# Patient Record
Sex: Female | Born: 1950
Health system: Southern US, Community
[De-identification: ages and names within clinical notes are randomized; demographics above are authoritative.]

## PROBLEM LIST (undated history)

## (undated) DIAGNOSIS — I739 Peripheral vascular disease, unspecified: Secondary | ICD-10-CM

## (undated) DIAGNOSIS — Z952 Presence of prosthetic heart valve: Secondary | ICD-10-CM

## (undated) DIAGNOSIS — I779 Disorder of arteries and arterioles, unspecified: Secondary | ICD-10-CM

## (undated) DIAGNOSIS — Z7901 Long term (current) use of anticoagulants: Secondary | ICD-10-CM

## (undated) DIAGNOSIS — M329 Systemic lupus erythematosus, unspecified: Secondary | ICD-10-CM

## (undated) DIAGNOSIS — I442 Atrioventricular block, complete: Secondary | ICD-10-CM

## (undated) DIAGNOSIS — E785 Hyperlipidemia, unspecified: Secondary | ICD-10-CM

## (undated) DIAGNOSIS — I10 Essential (primary) hypertension: Secondary | ICD-10-CM

## (undated) DIAGNOSIS — R760 Raised antibody titer: Secondary | ICD-10-CM

## (undated) DIAGNOSIS — I2510 Atherosclerotic heart disease of native coronary artery without angina pectoris: Secondary | ICD-10-CM

## (undated) DIAGNOSIS — Z9289 Personal history of other medical treatment: Secondary | ICD-10-CM

## (undated) HISTORY — DX: Peripheral vascular disease, unspecified: I73.9

## (undated) HISTORY — DX: Atherosclerotic heart disease of native coronary artery without angina pectoris: I25.10

## (undated) HISTORY — DX: Systemic lupus erythematosus, unspecified: M32.9

## (undated) HISTORY — DX: Presence of prosthetic heart valve: Z95.2

## (undated) HISTORY — PX: CAROTID ENDARTERECTOMY: SUR193

## (undated) HISTORY — DX: Personal history of other medical treatment: Z92.89

## (undated) HISTORY — DX: Essential (primary) hypertension: I10

## (undated) HISTORY — DX: Long term (current) use of anticoagulants: Z79.01

## (undated) HISTORY — PX: AORTIC VALVE REPLACEMENT: SHX41

## (undated) HISTORY — DX: Disorder of arteries and arterioles, unspecified: I77.9

## (undated) HISTORY — DX: Raised antibody titer: R76.0

## (undated) HISTORY — DX: Hyperlipidemia, unspecified: E78.5

---

## 1982-11-20 HISTORY — PX: TUBAL LIGATION: SHX77

## 1997-02-19 HISTORY — PX: CHOLECYSTECTOMY: SHX55

## 1997-11-08 ENCOUNTER — Emergency Department (HOSPITAL_COMMUNITY): Admission: EM | Admit: 1997-11-08 | Discharge: 1997-11-08 | Payer: Self-pay

## 1997-11-11 ENCOUNTER — Encounter: Payer: Self-pay | Admitting: Gastroenterology

## 1997-11-11 ENCOUNTER — Inpatient Hospital Stay (HOSPITAL_COMMUNITY): Admission: RE | Admit: 1997-11-11 | Discharge: 1997-11-13 | Payer: Self-pay | Admitting: Gastroenterology

## 1997-12-21 ENCOUNTER — Other Ambulatory Visit: Admission: RE | Admit: 1997-12-21 | Discharge: 1997-12-21 | Payer: Self-pay | Admitting: *Deleted

## 1998-03-03 ENCOUNTER — Ambulatory Visit (HOSPITAL_COMMUNITY): Admission: RE | Admit: 1998-03-03 | Discharge: 1998-03-03 | Payer: Self-pay | Admitting: Vascular Surgery

## 1998-03-03 ENCOUNTER — Encounter: Payer: Self-pay | Admitting: Vascular Surgery

## 1998-03-05 ENCOUNTER — Ambulatory Visit (HOSPITAL_COMMUNITY): Admission: RE | Admit: 1998-03-05 | Discharge: 1998-03-05 | Payer: Self-pay | Admitting: Family Medicine

## 1998-03-05 ENCOUNTER — Encounter: Payer: Self-pay | Admitting: Family Medicine

## 1998-03-09 ENCOUNTER — Ambulatory Visit (HOSPITAL_COMMUNITY): Admission: RE | Admit: 1998-03-09 | Discharge: 1998-03-09 | Payer: Self-pay | Admitting: Family Medicine

## 1998-03-27 ENCOUNTER — Emergency Department (HOSPITAL_COMMUNITY): Admission: EM | Admit: 1998-03-27 | Discharge: 1998-03-27 | Payer: Self-pay | Admitting: Emergency Medicine

## 1998-04-08 ENCOUNTER — Emergency Department (HOSPITAL_COMMUNITY): Admission: EM | Admit: 1998-04-08 | Discharge: 1998-04-08 | Payer: Self-pay | Admitting: Emergency Medicine

## 1998-04-11 ENCOUNTER — Encounter: Payer: Self-pay | Admitting: Vascular Surgery

## 1998-04-11 ENCOUNTER — Ambulatory Visit (HOSPITAL_COMMUNITY): Admission: RE | Admit: 1998-04-11 | Discharge: 1998-04-11 | Payer: Self-pay | Admitting: Vascular Surgery

## 1998-04-20 ENCOUNTER — Encounter: Payer: Self-pay | Admitting: Vascular Surgery

## 1998-04-20 ENCOUNTER — Inpatient Hospital Stay: Admission: RE | Admit: 1998-04-20 | Discharge: 1998-04-22 | Payer: Self-pay | Admitting: Vascular Surgery

## 1998-11-18 ENCOUNTER — Ambulatory Visit (HOSPITAL_COMMUNITY): Admission: RE | Admit: 1998-11-18 | Discharge: 1998-11-18 | Payer: Self-pay | Admitting: Nephrology

## 1998-11-18 ENCOUNTER — Encounter: Payer: Self-pay | Admitting: Nephrology

## 1999-01-20 ENCOUNTER — Other Ambulatory Visit: Admission: RE | Admit: 1999-01-20 | Discharge: 1999-01-20 | Payer: Self-pay | Admitting: *Deleted

## 1999-12-11 ENCOUNTER — Encounter: Payer: Self-pay | Admitting: Nephrology

## 1999-12-11 ENCOUNTER — Ambulatory Visit (HOSPITAL_COMMUNITY): Admission: RE | Admit: 1999-12-11 | Discharge: 1999-12-11 | Payer: Self-pay | Admitting: Nephrology

## 2000-01-05 ENCOUNTER — Ambulatory Visit (HOSPITAL_COMMUNITY): Admission: RE | Admit: 2000-01-05 | Discharge: 2000-01-05 | Payer: Self-pay | Admitting: Nephrology

## 2000-01-05 ENCOUNTER — Encounter: Payer: Self-pay | Admitting: Nephrology

## 2000-01-23 ENCOUNTER — Other Ambulatory Visit: Admission: RE | Admit: 2000-01-23 | Discharge: 2000-01-23 | Payer: Self-pay | Admitting: *Deleted

## 2000-09-19 ENCOUNTER — Other Ambulatory Visit: Admission: RE | Admit: 2000-09-19 | Discharge: 2000-09-19 | Payer: Self-pay | Admitting: *Deleted

## 2000-09-19 ENCOUNTER — Encounter (INDEPENDENT_AMBULATORY_CARE_PROVIDER_SITE_OTHER): Payer: Self-pay | Admitting: Specialist

## 2000-09-26 ENCOUNTER — Ambulatory Visit (HOSPITAL_COMMUNITY): Admission: RE | Admit: 2000-09-26 | Discharge: 2000-09-26 | Payer: Self-pay | Admitting: Cardiovascular Disease

## 2000-09-26 ENCOUNTER — Encounter: Payer: Self-pay | Admitting: Cardiovascular Disease

## 2001-01-21 ENCOUNTER — Ambulatory Visit (HOSPITAL_COMMUNITY): Admission: RE | Admit: 2001-01-21 | Discharge: 2001-01-21 | Payer: Self-pay | Admitting: Cardiovascular Disease

## 2001-08-31 ENCOUNTER — Emergency Department (HOSPITAL_COMMUNITY): Admission: EM | Admit: 2001-08-31 | Discharge: 2001-08-31 | Payer: Self-pay

## 2002-03-02 ENCOUNTER — Other Ambulatory Visit: Admission: RE | Admit: 2002-03-02 | Discharge: 2002-03-02 | Payer: Self-pay | Admitting: Obstetrics and Gynecology

## 2002-04-13 ENCOUNTER — Inpatient Hospital Stay (HOSPITAL_COMMUNITY): Admission: EM | Admit: 2002-04-13 | Discharge: 2002-04-16 | Payer: Self-pay | Admitting: Emergency Medicine

## 2002-04-13 ENCOUNTER — Encounter: Payer: Self-pay | Admitting: Emergency Medicine

## 2002-04-13 ENCOUNTER — Encounter: Payer: Self-pay | Admitting: Orthopaedic Surgery

## 2003-03-16 ENCOUNTER — Other Ambulatory Visit: Admission: RE | Admit: 2003-03-16 | Discharge: 2003-03-16 | Payer: Self-pay | Admitting: Obstetrics and Gynecology

## 2004-04-20 ENCOUNTER — Ambulatory Visit: Payer: Self-pay | Admitting: Internal Medicine

## 2004-10-26 ENCOUNTER — Encounter: Admission: RE | Admit: 2004-10-26 | Discharge: 2004-10-26 | Payer: Self-pay | Admitting: Vascular Surgery

## 2004-11-01 HISTORY — PX: CARDIAC CATHETERIZATION: SHX172

## 2005-01-17 ENCOUNTER — Inpatient Hospital Stay (HOSPITAL_COMMUNITY): Admission: RE | Admit: 2005-01-17 | Discharge: 2005-01-23 | Payer: Self-pay | Admitting: Cardiothoracic Surgery

## 2005-01-17 ENCOUNTER — Encounter (INDEPENDENT_AMBULATORY_CARE_PROVIDER_SITE_OTHER): Payer: Self-pay | Admitting: Specialist

## 2005-02-22 ENCOUNTER — Encounter: Admission: RE | Admit: 2005-02-22 | Discharge: 2005-02-22 | Payer: Self-pay | Admitting: Cardiothoracic Surgery

## 2005-02-26 ENCOUNTER — Ambulatory Visit: Payer: Self-pay | Admitting: Internal Medicine

## 2005-04-20 ENCOUNTER — Ambulatory Visit: Payer: Self-pay | Admitting: Internal Medicine

## 2006-05-21 ENCOUNTER — Ambulatory Visit: Payer: Self-pay | Admitting: Internal Medicine

## 2006-07-01 ENCOUNTER — Ambulatory Visit: Payer: Self-pay | Admitting: Family Medicine

## 2006-07-01 ENCOUNTER — Inpatient Hospital Stay (HOSPITAL_COMMUNITY): Admission: EM | Admit: 2006-07-01 | Discharge: 2006-07-02 | Payer: Self-pay | Admitting: Emergency Medicine

## 2007-08-25 ENCOUNTER — Telehealth (INDEPENDENT_AMBULATORY_CARE_PROVIDER_SITE_OTHER): Payer: Self-pay | Admitting: *Deleted

## 2007-08-26 ENCOUNTER — Ambulatory Visit: Payer: Self-pay | Admitting: Internal Medicine

## 2007-08-26 DIAGNOSIS — M329 Systemic lupus erythematosus, unspecified: Secondary | ICD-10-CM | POA: Insufficient documentation

## 2007-08-26 DIAGNOSIS — J4520 Mild intermittent asthma, uncomplicated: Secondary | ICD-10-CM | POA: Insufficient documentation

## 2007-08-26 DIAGNOSIS — Z952 Presence of prosthetic heart valve: Secondary | ICD-10-CM | POA: Insufficient documentation

## 2009-04-22 ENCOUNTER — Ambulatory Visit: Payer: Self-pay | Admitting: Internal Medicine

## 2010-01-11 ENCOUNTER — Encounter: Admission: RE | Admit: 2010-01-11 | Discharge: 2010-01-11 | Payer: Self-pay | Admitting: Orthopaedic Surgery

## 2010-01-20 DIAGNOSIS — Z9289 Personal history of other medical treatment: Secondary | ICD-10-CM

## 2010-01-20 HISTORY — DX: Personal history of other medical treatment: Z92.89

## 2010-03-21 NOTE — Assessment & Plan Note (Signed)
Summary: cough/apc   Primary Provider/Referring Provider:  Driscilla Moats  CC:  Accute visit-cough non productive x 1 week. Slight BOB and chills today.[name]  History of Present Illness: 7/09- AORTIC VALVE REPLACEMENT, HX OF (ICD-V43.3) LUPUS (SLE) (ICD-710.0) ASTHMA (ICD-493.90)  76 yoF returning for f/u of asthma with history of aortic valve replacement. We reviewed medications. Bothered by heat but denies new problems.  2009/05/22- Asthma, lupus, Hx Aortic Valve replc Last here 2009. comes now acute visit. Onset 1 week ago of cough, nonproductive but with substernal rattle. Sore throat from cough and transient tussive left rib pain now resolved. No GI distress and no fever or chills, head congestion or sinus drainage. She was up with family because her father died. she was exposed to others who were sick. Was using Advair once daily because it made her cough. No recent Proair.    Current Medications (verified): 1)  Advair Diskus 250-50 Mcg/dose  Misc (Fluticasone-Salmeterol) .... One Puff Twice Daily 2)  Proair Hfa 108 (90 Base) Mcg/act  Aers (Albuterol Sulfate) .... 2 Puffs Four Times A Day As Needed 3)  Altace 5 Mg  Tabs (Ramipril) .... Take 1 By Mouth Once Daily 4)  Adalat Cc 60 Mg  Tb24 (Nifedipine) .... Take 1` By Mouth Once Daily 5)  Prednisone 5 Mg  Tabs (Prednisone) .... Take 1 By Mouth Once Daily 6)  Hydroxychloroquine Sulfate 200 Mg  Tabs (Hydroxychloroquine Sulfate) .... Take 1 By Mouth Two Times A Day 7)  Caltrate 600 1500 Mg  Tabs (Calcium Carbonate) .... Take 1 By Mouth Two Times A Day 8)  Centrum Silver   Tabs (Multiple Vitamins-Minerals) .... Take 1 By Mouth Once Daily 9)  Zocor 20 Mg  Tabs (Simvastatin) .... Take 1 By Mouth At Bedtime 10)  Coumadin 10 Mg  Tabs (Warfarin Sodium) .... As Directed 11)  Furosemide 80 Mg Tabs (Furosemide) .... Take 1 By Mouth Once Daily 12)  Fish Oil 1000 Mg Caps (Omega-3 Fatty Acids) .... Take 1 By Mouth Two Times A Day 13)  Vitamin D  1000 Unit Tabs (Cholecalciferol) .... Take 1 By Mouth Once Daily  Allergies (verified): No Known Drug Allergies  Past History:  Past Medical History: Last updated: 08/26/2007 Asthma Lupus (SLE)  Past Surgical History: Last updated: 08/26/2007 Aortic valve replacement  Family History: Last updated: May 22, 2009 Father- died Alzheimers and nonalcoholic cirrhosis  Social History: Last updated: 08/26/2007 Patient never smoked.   Risk Factors: Smoking Status: never (08/26/2007)  Family History: Father- died Alzheimers and nonalcoholic cirrhosis  Review of Systems      See HPI       The patient complains of prolonged cough.  The patient denies anorexia, fever, weight loss, weight gain, vision loss, decreased hearing, hoarseness, chest pain, syncope, dyspnea on exertion, peripheral edema, headaches, hemoptysis, abdominal pain, and severe indigestion/heartburn.    Vital Signs:  Patient profile:   60 year old female Weight:      257 pounds O2 Sat:      98 % on Room air Temp:     97.3 degrees F oral Pulse rate:   77 / minute BP sitting:   122 / 78  (left arm) Cuff size:   large  Vitals Entered By: Clayborne Dana CMA May 22, 2009 11:39 AM)  O2 Flow:  Room air  Physical Exam  Additional Exam:  General: A/Ox3; pleasant and cooperative, NAD, overweight SKIN: no rash, lesions NODES: no lymphadenopathy HEENT: [place]/AT, EOM- WNL, Conjuctivae- clear, PERRLA,  TM-WNL, Nose- clear, Throat- clear and wnl, Mallampati  III NECK: Supple w/ fair ROM, JVD- none, normal carotid impulses w/o bruits Thyroid- normal to palpation CHEST: Clear to P&A, not coughing, no wheeze HEART: RRR, no m/g/r heard ABDOMEN: Soft and nl;  [id number], nl pulses, no edema  NEURO: Grossly intact to observation      Impression & Recommendations:  Problem # 1:  ASTHMA (ICD-493.90) i can't get hx suggesting obvious seasonal allergy or GERD/ aspiration event so this is most likely a viral bronchitis  despitre lack of nasal involvement, sore throat or fever that she recognized.  Medications Added to Medication List This Visit: 1)  Furosemide 80 Mg Tabs (Furosemide) .... Take 1 by mouth once daily 2)  Fish Oil 1000 Mg Caps (Omega-3 fatty acids) .... Take 1 by mouth two times a day 3)  Vitamin D 1000 Unit Tabs (Cholecalciferol) .... Take 1 by mouth once daily  Other Orders: Est. Patient Level II [id number]) Admin of Therapeutic Inj  intramuscular or subcutaneous [id number]) Depo- Medrol 80mg  (J1040) Nebulizer Tx [id number])  Patient Instructions: 1)  Schedule return in one year, earlier if needed 2)  Neb xop 1.25 3)  depo 80 4)  Script for [employer] rescue inhaler refill to put on hold at drug store for use as needed Prescriptions: PROAIR HFA 108 (90 BASE) MCG/ACT  AERS (ALBUTEROL SULFATE) 2 puffs four times a day as needed  #1 x prn   Entered and Authorized by:   Deneise Lever MD   Signed by:   Deneise Lever MD on 04/22/2009   Method used:   Print then Give to Patient   [id number]      Medication Administration  Injection # 1:    Medication: Depo- Medrol 80mg     Diagnosis: ASTHMA (ICD-493.90)    Route: SQ    Site: LUOQ gluteus    Exp Date: 12/2011    Lot #: [id number]    Mfr: Pharmacia    Patient tolerated injection without complications    Given by: Blair Hailey  Medication # 1:    Medication: Xopenex 1.25mg     Diagnosis: ASTHMA (ICD-493.90)    Dose: 1 vial    Route: inhaled    Exp Date: 10/2009    Lot #: [id number]    Mfr: Sepracor    Patient tolerated medication without complications    Given by: Blair Hailey  Orders Added: 1)  Est. Patient Level II [id number] 2)  Admin of Therapeutic Inj  intramuscular or subcutaneous [[id number]] 3)  Depo- Medrol 80mg  [J1040] 4)  Nebulizer Tx [id number]

## 2010-07-04 NOTE — H&P (Signed)
NAMEJENNIVER, PURCELL                 ACCOUNT NO.:  [id number]   MEDICAL RECORD NO.:  [id number]          PATIENT TYPE:  INP   LOCATION:  5709                         FACILITY:  [facility]   PHYSICIAN:  Brion Aliment, M.D.  DATE OF BIRTH:  1950-05-20   DATE OF ADMISSION:  06/29/2006  DATE OF DISCHARGE:  07/02/2006                              HISTORY & PHYSICAL   CHIEF COMPLAINT:  Abdominal pain.   HISTORY OF PRESENT ILLNESS:  Ms. Grall is a 60 year old female with  significant history of carotid, superior mesenteric artery, and right  renal artery stenosis, who presents with a 1-day history of sharp,  constant abdominal pain, at first diffuse and then localized into the  right lower quadrant.  She also complains of nausea but denies vomiting,  diarrhea, or constipation.  The pain started about 4 or 5 p.m. the day  before admission.  Drinking liquids does not bother the pain, but she  has not tried any food since lunch time the day before admission but  does state that she is hungry now.  Her last bowel movement was the a.m.  of admission and was slightly loose, nonmelanotic with no bright red  blood.  Her pain improved slightly after her bowel movement.  She was up  all last night with the pain, is not worse with movement but does  radiate some to her back.  She complains of chills but no fever.  She  went to [facility] and was sent to the [facility] emergency  department, where CT scan showed colitis.  She received 1 mg of Dilaudid  and some Zofran with some relief.  She was started on Cipro and Flagyl,  and we were called to admit.   PAST MEDICAL HISTORY:  1. Aortic valve replacement in 2006.  2. SLE.  3. Hypertension.  4. Asthma.  5. Right renal artery stenosis.  6. SMA stenosis.  7. Left carotid stenosis.   SURGICAL HISTORY:  1. Aortic valve replacement in November of 2006 with a mechanical      valve.  2. Cholecystectomy in 1999.  3. BTL.  4. Right ankle surgery  in 2004.  5. Left common internal carotid bypass grafting in 2000.  6. A D&C in 2002.   ALLERGIES:  No known drug allergies.   HOME MEDICATIONS:  1. Coumadin 5 mg daily.  2. Altace 5 mg daily.  3. Zocor 20 mg daily.  4. Prednisone 5 mg daily.  5. Advair 250/50 one puff daily.  6. Lasix 80 mg daily.  7. Caltrate 600 mg b.i.d.  8. Hydroxychloroquine 200 mg b.i.d.  9. Adalat 60 mg one daily.  10.Vitamin D daily.  11.Fish oil 1 tablet daily.   SOCIAL HISTORY:  The patient is married with 3 kids.  She denies tobacco  history, alcohol history, or illicit drug history.  She is retired and  lives in [place].   FAMILY HISTORY:  Both parents are alive.  Her mother has rheumatoid  arthritis and corneal dystrophy.  Her dad has Alzheimer's disease.   REVIEW OF SYSTEMS:  In  general, negative for weight loss and fevers,  positive for chills.  HEENT:  Positive for seasonal allergies.  Cardiovascular:  Negative for chest pain, negative for palpitations.  Pulmonary:  Negative for cough, negative for shortness of breath,  negative for wheezing.  GI:  See HPI.  GU:  Negative for dysuria,  negative for urgency, negative for frequency, negative for hematuria.  Skin:  Negative for rash.  Neuro:  Negative for weakness.  Heme:  Positive for easy bruising.  Musculoskeletal:  Positive for the  patient's typical joint pain from SLE.   PHYSICAL EXAMINATION:  VITAL SIGNS:  Temperature is 98.3 to 99.1, pulse  is 78 to 84, blood pressure 121 to 142/69 to 73, respirations were 16 to  24, and sats were 98 to 100% on room air.  GENERAL:  The patient was alert and oriented and in no acute distress.  HEENT:  Head is normocephalic, atraumatic.  Eyes:  PERRL.  EOMI.  Mild  scleral injection.  Nose:  Nares clear.  Oropharynx clear without  erythema or exudate.  NECK:  Supple, nontender without lymphadenopathy.  CARDIOVASCULAR:  Heart had a regular rate and rhythm with a 2/6 systolic  ejection murmur loudest  at the right upper sternal border.  PULMONARY:  Lungs were clear to auscultation bilaterally without  crackles or wheezes, and the patient had normal respiratory effort.  ABDOMEN:  Soft and tender to palpation in the right lower quadrant and  suprapubic region.  The patient had no rebound and no guarding.  She had  normoactive bowel sounds and no hepatosplenomegaly or masses.  RECTAL EXAM:  Had normal tone and was heme positive.  EXTREMITIES:  Had 2+ pitting edema to the mid shin.  She had 2+ dorsalis  pedis pulses bilaterally.  NEURO EXAM:  Nonfocal.   LABORATORY:  White count was 12.5, hemoglobin was 13.1, hematocrit was  38.9, platelets are 253.  ANC was 10.4, INR was 2.8.  Sodium 136,  potassium 4.2, chloride 100, bicarb 28, BUN 20, creatinine 1.08, glucose  117, calcium 9.3, total protein was 7.4, albumin was 3.8, AST was 30,  ALT was 29.  A UA shows specific gravity of 1.012 with trace of  hemoglobin, trace of leukocyte esterase, rare bacteria, and 0-2 white  blood cells.  A CT scan of the abdomen and pelvis with contrast showed a  right cecal and proximal ascending colon, mural thickening, surrounding  inflammation compatible with colitis.   DIFFERENTIAL DIAGNOSIS:  Including infection, inflammation, ischemic  versus a mural mass/malignancy.  No adenopathy, free air, abscess, or  obstruction was seen.   ASSESSMENT AND PLAN:  This is a 60 year old female with significant  history of vascular disease and colitis.  1. Colitis:  Differential diagnosis includes ischemic colitis,      inflammatory bowel disease, infectious colitis, and mural      malignancy.  The patient has no gross blood in stools but stools      are heme positive.  She is at high risk for ischemia given her      history of critical superior mesenteric artery stenosis on MRA in      2006.  We thought her risk of infectious colitis was low given that     her white count is almost normal.  She is afebrile and has  no      diarrhea or has sick contacts.  However, we did obtain a stool      culture.  This could also be  inflammatory bowel disease, as the      patient has a history of autoimmune issues with her systemic lupus      erythematosus.  Malignancy unlikely given sudden onset without      weight loss or systemic symptoms.  We had admitted the patient,      began empiric Cipro and Flagyl, and IV fluids, and bowel rest.  The      patient was started on morphine for pain as needed.  The patient is      not having any vomiting, so NG tube was not necessary at the time      of admission.  2. Fluids, electrolytes, and nutrition and gastrointestinal:  Keep the      patient n.p.o. and start IV fluids D5 half-normal saline plus 20 of      KCl at 15 mL/hr.  3. Renal:  The patient's urine had a trace of leukocyte esterase so we      will obtain a urine culture.  4. Cardiovascular:  We will hold the patient's hypertensive medicines      and Coumadin while she is n.p.o.  If necessary, we will start      Lovenox to keep her anticoagulated during this time.  Also we will      watch her blood pressure and give IV medications if necessary.  5. Hyperlipidemia:  We will hold the patient's Zocor while she is      n.p.o.  6. Asthma:  We will continue the patient's Advair.  7. Lupus:  We will hold the patient's Plaquenil and prednisone and      resume when the patient is no longer n.p.o.           ______________________________  Brion Aliment, M.D.     JH/MEDQ  D:  07/02/2006  T:  07/02/2006  Job:  [id number]

## 2010-07-04 NOTE — Discharge Summary (Signed)
[name], [name]                 ACCOUNT NO.:  [id number]   MEDICAL RECORD NO.:  [id number]          PATIENT TYPE:  INP   LOCATION:  5709                         FACILITY:  [facility]   PHYSICIAN:  Brion Aliment, M.D.  DATE OF BIRTH:  [birth year]   DATE OF ADMISSION:  06/29/2006  DATE OF DISCHARGE:  07/02/2006                               DISCHARGE SUMMARY   DISCHARGE DIAGNOSES:  1. Ischemic colitis.  2. Systemic lupus erythematosus.  3. Hypertension.  4. Asthma.  5. Right renal artery stenosis.  6. History of critical superior mesenteric artery stenosis.  7. History of left carotid stenosis status post coronary artery bypass      grafting.  8. History of aortic valve replacement in 2006.   DISCHARGE MEDICATIONS:  1. Coumadin 10 mg daily except 7.5 mg daily on Monday and Wednesday.  2. Zocor 20 mg daily.  3. Altace 5 mg daily.  4. Advair 250/50 one puff daily.  5. Lasix 80 mg daily.  6. Hydroxychloroquine 200 mg daily.  7. Adalat XL 60 mg daily.  8. Fish oil 1 tablet daily.  9. Prednisone 5 mg daily.   PROCEDURES:  The patient had a CT scan of the abdomen and pelvis with  contrast that showed right cecal and proximal ascending colon mural  thickening and surrounding inflammation compatible with colitis.   DIFFERENTIAL DIAGNOSIS:  Infection, inflammation, ischemic versus a  mural mass/malignancy.  No adenopathy with free air, abscess, or  obstruction was seen.   CONSULTS:  GI, Dr. Cristina Gong.   ADMISSION LABORATORY:  White blood count was 12.5, hemoglobin was 13.1,  hematocrit 38.9, platelets 253, and ANC was 10.4, and INR was 2.8.  Sodium was 136, potassium was 4.2, chloride was 100, bicarb was 28, BUN  was 20, creatinine is 1.08.  Glucose was 117, calcium was 9.3, total  protein was 7.4, and albumin was 3.8, and AST was 30, and ALT was 29.  A  UA has specific gravity of 1.012, had a trace of hemoglobin and a trace  of leukocyte esterase, rare bacteria, and 0-2 white  blood cells.  Stool  was Hemoccult positive.  Amylase and lipase were within normal limits  and LDH was 253.   DISCHARGE LABORATORY:  Sodium 137, potassium 4.2, chloride 103, bicarb  27, BUN 10, creatinine 1.04, glucose 155, calcium 9.1, INR was 1.2.  Stool cultures were pending, and urine culture showed 40,000 colonies of  corynebacterium.  White blood count was 11.8, hemoglobin was 11.8,  hematocrit was 35.2, platelets were 211.   FOLLOWUP:  The patient is to follow up with Dr. Cristina Gong for GI on July 24, 2006, and is to return to [facility] for an INR check and Coumadin  adjustment tomorrow at Jul 03, 2006.   HOSPITAL COURSE:  Ms. Capoccia is a 60 year old female with a significant  history of vascular disease including carotid, SMA, and right renal  artery stenosis, who presented with a 1-day history of constant, sharp  right lower quadrant abdominal pain with nausea but no vomiting or  diarrhea.  CT scan showed colitis with  a wide differential diagnosis.  Her pain was improved after 1 mg of Dilaudid in the ER and Zofran.  So  was started on IV Cipro and Flagyl in the ER, and we were called to  admit.   PROBLEM:  1. Differential diagnosis included ischemic, infectious inflammatory      bowel disease or mural mass/malignancy.  However, given the      patient's significant history of a critical superior mesenteric      artery stenosis seen on MRA in 2006, ischemic colitis was our      leading diagnosis.  The patient did have Hemoccult positive stools      to support this.  She was admitted, started on IV Cipro and IV      Flagyl and was treated with bowel rest and IV fluids for greater      than 24 hours; at which time, her pain had nearly resolved, and she      was advanced on to a clear diet, which she tolerated well.  She      then became pain free, and her diet was further advanced, and she      tolerated this well without pain or nausea or vomiting.  The      patient had no signs of  infection at the time of admission.  Her      white count normalized to 4.8 at the time of discharge.  She      remained afebrile throughout the hospital stay.  GI was consulted.      Dr. Cristina Gong came to see the patient and felt it would be best to      undergo elective diagnostic and screening colonoscopy as an      outpatient, and this appointment was set up for July 24, 2006.  Her      antibiotics were stopped at the time of discharge since they were      no signs of an infectious cause of her colitis.  However, a stool      culture is pending that we will follow.  2. Hypertension:  Initially, the patient's antihypertensive      medications were held as she was n.p.o.  However, on the second day      of hospitalization, they were reinitiated, and her blood pressure      remained within good control during the entire hospital stay.  3. Systemic lupus erythematosus:  The patient's systemic lupus      erythematosus medications Plaquenil and prednisone were held the      first day of admission but restarted on day #2, and the patient had      no flares during the hospital stay.  4. Asthma:  The patient was treated with her home dose of Advair and      had no problems during hospitalization.  5. Anticoagulation secondary to mechanical aortic valve.  The      patient's Coumadin was held on admission, as it was within      therapeutic level, and the patient was started on Lovenox.  On day      #2, the patient's Coumadin was restarted, and at the time of      discharge, her INR was 1.2, and she will have this followed very      closely at [facility], where she has a standing order for INR checks      as needed with subsequent adjustment of Coumadin dose.  ______________________________  Brion Aliment, M.D.     JH/MEDQ  D:  07/02/2006  T:  07/02/2006  Job:  [id number]

## 2010-07-04 NOTE — Consult Note (Signed)
[name], [name]                 ACCOUNT NO.:  [id number]   MEDICAL RECORD NO.:  [id number]          PATIENT TYPE:  INP   LOCATION:  5709                         FACILITY:  [facility]   PHYSICIAN:  Ronald Lobo, M.D.   DATE OF BIRTH:  1950/08/30   DATE OF CONSULTATION:  07/01/2006  DATE OF DISCHARGE:                                 CONSULTATION   CONSULTATIONS:  Gastroenterology.   HISTORY OF PRESENT ILLNESS:  The [facility] (Dr.  Wandra Feinstein, attending) asked [place] to see this 60 year old female  because of  apparent ischemic colitis.   HISTORY:  [name] has no prior history of ischemic colitis or other  intestinal problems, but presented to the emergency room 2 days ago with  the acute onset of diffuse abdominal pain which centered in the right  lower quadrant and was soon thereafter associated with diarrhea, albeit  nonbloody in character.  Since that time, her pain has markedly  improved, although, I believe some diarrhea is still present.   An admitting CT scan showed proximal segmental colitis suggestive of  ischemia.  Because of this, we were asked to see the patient.  She has  never previously had colonoscopy.  She realizes she is due for one for  screening purposes.   PAST MEDICAL HISTORY:  No known allergies.   OUTPATIENT MEDICATIONS:  1. Zocor.  2. Altace.  3. afeditab.  4. Hydroxychloride.  5. Prednisone.  6. Furosemide.  7. Coumadin.  8. Ferrex.  9. Advair.  10.Caltrate.  11.Multiple vitamins.  12.Fish oil.  13.Vitamin D.   OPERATIONS:  Aortic valve replacement November 2006, cholecystectomy  1999, BTL, left carotid surgery.   MEDICAL ILLNESSES:  1. Systemic lupus erythematosus which I believe was diagnosed about 8      years ago.  2. Hypertension.  3. Asthma.  4. Right renal artery stenosis.  5. History of superior mesenteric artery stenosis.  6. Left carotid stenosis necessitating carotid artery surgery.   HABITS:   Nonsmoker, nondrinker.   FAMILY HISTORY:  Negative for GI illnesses including inflammatory bowel  disease or colon cancer.   SOCIAL HISTORY:  Married, lives with her college age daughter who just  graduated, not working outside the home, fully independent in activities  of daily living.   REVIEW OF SYSTEMS:  Negative for chronic or prodromal problems with the  GI tract such as anorexia, weight loss, dysphagia, abdominal pain.  She  does tend to go to the bathroom fairly frequently, typically two to  three bowel movements a day which are sometimes loose in character,  especially after meals.  It has been like this for years and there has  been no major change recently.  However, the patient did have a GI bug  recently characterized by nausea, vomiting and diarrhea for several  days, approximately 2 weeks ago.   PHYSICAL EXAMINATION:  GENERAL:  A somewhat overweight, very pleasant  and healthy-appearing, chipper Caucasian female in no evident distress,  appearing neither anxious nor depressed.  VITAL SIGNS:  Temperature 97.7, blood pressure 108/47, pulse 72,  afebrile.  HEENT:  Anicteric.  No evident pallor.  CHEST:  Clear to auscultation.  HEART:  No gallops rubs, clicks or arrhythmias but there is a [id number]  systolic murmur consistent with her aortic valve.  ABDOMEN:  Adipose with active bowel sounds but no guarding, mass,  tenderness or evident organomegaly.  RECTAL:  Exam not performed; she was noted to be Hemoccult positive but  without gross blood in association with this hospitalization per  conversation with Dr[facility].   LABORATORY DATA:  Admission white count 12,500, which has fallen  progressively to a current level of 5800.  Admission hemoglobin 13.1,  has fallen to 11.0 with hydration, platelets currently 177,000, MCV  normal at 84, RDW slightly elevated 15.5.  INR an admission was 2.8 and  yesterday it was 1.8, having been on  Coumadin prior to admission.  BUN  was 20 on admission and is currently 8.  Chemistry panel otherwise  unremarkable including normal liver chemistries and an albumin of 3.8 at  the time of admission.  Amylase and lipase were normal on admission and  LVH has been consistently normal.   Stool has been positive for occult blood x3   CT scan.  I have reviewed the abdominal pelvic CT scan.  There is some  thickening in the ascending colon or cecal region.  It is noted that the  SMA is patent, even though it is known from prior evaluation to be  stenotic.   IMPRESSION:  I think the overall picture here is very compatible with  acute ischemic colitis which is clearly in the resolving phase, as  characterized by improved white count, the absence of any tenderness,  and a marked improvement in the patient's abdominal pain.   DISCUSSION AND PLAN:  1. I discussed with the patient the pros and cons of colonoscopic      evaluation now which would help confirm the diagnosis, versus      deferring colonoscopy to an elective outpatient status primarily      for screening purposes and to exclude any chronic or underlying      disease in the colon (doubt).  The patient and I both feel the      latter option is preferable, both because of slightly increased      risk of doing the procedure at this time given acute inflammatory      changes in the intestine, and also because it might obscure subtle      or small lesions such as small sessile or flap polyps in the      involved area of the colon.  Since the treatment for ischemic      colitis is expectant, and no change in outpatient therapy is      required as a consequence of it, it is crucial to verify that the      patient does or does not have ischemic colitis at this time.  2. I am okay with advancement in the diet and resumption of      anticoagulation as already ordered. 3. I think the patient's use of antibiotics at this time is optional,       and I would feel comfortable stopping the Cipro and Flagyl that      were started empirically at the time of admission.  4. Consideration has been given to bumping the patient's prednisone      dose for stress at this time.  I have no major  objection to it,      although I tend to think that the patient is already in a resolving      phase and probably does not need stress steroids at this stage of      her illness, but I will defer to the attending physicians regarding      that.  5. Ischemic colitis is not thought to mandate to need for any      diagnostic workup or for any alteration of diet, medications, etc.      Of note, the patient is already anticoagulated on account of her      prosthetic aortic valve, but even if she were not, it would not be      necessary to initiate anticoagulation or antiplatelet therapy just      because she has (presumably) had a case of ischemic colitis.   I have made arrangements for the patient to follow-up with me in the  office on June 4, at which time we will probably arrange a colonoscopy,  for which antibiotic coverage would be optional based on the newest  guidelines of the American Heart Association, and we will probably check  an INR the day prior to her procedure to make sure that her INR is  within the appropriate therapeutic range (not necessary to stop Coumadin  prior to a diagnostic colonoscopy).  The patient says that her INR tends  to fluctuate quite a bit, so it would be important to make sure that her  INR is not extremely elevated at the time we do the procedure.  She is  aware that large lesions would not be able to be removed under this  circumstance.   I appreciate the opportunity to have seen this patient in consultation  with you.           ______________________________  Ronald Lobo, M.D.     RB/MEDQ  D:  07/01/2006  T:  07/01/2006  Job:  [id number]   cc:   [facility], Family [facility] Dr. Silvestre Gunner   Clinton D. Young, MD, FCCP, [place] Florene Glen, M.D.  Quay Burow, M.D.  Anson Oregon, M.D.

## 2010-07-07 NOTE — Op Note (Signed)
[name], [name]                 ACCOUNT NO.:  [id number]   MEDICAL RECORD NO.:  [id number]          PATIENT TYPE:  INP   LOCATION:  2308                         FACILITY:  [facility]   PHYSICIAN:  Lanelle Bal, MD    DATE OF BIRTH:  November 21, 1950   DATE OF PROCEDURE:  01/17/2005  DATE OF DISCHARGE:                                 OPERATIVE REPORT   PREOPERATIVE DIAGNOSIS:  Aortic insufficiency.   POSTOPERATIVE DIAGNOSIS:  Aortic insufficiency.   PROCEDURE:  Aortic valve replacement with a [employer] mechanical valve  model R500, size 23, serial [id number].   SURGEON:  Lanelle Bal, MD   FIRST ASSISTANT:  Suzzanne Cloud, PA   BRIEF HISTORY:  The patient is a 60 year old female with history of lupus  with mesenteric and renal artery disease and carotid artery disease who  presents after being followed for several years by Dr. Gwenlyn Found.  The patient  has had known aortic insufficiency and has been followed closely with serial  echocardiograms.  Because of increasing shortness of breath and also  evidence of dilating left ventricular diameters, the patient was referred  for consideration of aortic valve replacement.  After full evaluation of her  other vascular disease including cardiac catheterization, aortic valve  replacement was recommended to the patient.  After a long discussion of pros  and cons of mechanical versus tissue valve, because of her age, she agreed  with a mechanical valve and taking Coumadin.   DESCRIPTION OF PROCEDURE:  With Swan-Ganz and arterial line monitors placed,  the patient underwent general endotracheal anesthesia without incident.  The  chest and legs were prepped with Betadine and draped in the usual sterile  manner.  A transesophageal echo probe was placed and under separate report  is described.  The patient had trace mitral insufficiency to severe aortic  insufficiency with an eccentric jet and slightly dilated LV diameters.  No  obvious vegetations  were appreciated.   The patient was prepped and draped in the usual sterile manner.  A median  sternotomy was performed.  The pericardium was opened.  The patient had  evidence of dilated LV.  The ascending aorta and the right atrium were  cannulated, and a retrograde cardioplegic catheter was placed.  The patient  was placed on cardiopulmonary bypass, 2.4 liter per minute per meter  squared.  Right superior pulmonary vein then was placed.  The patient's body  temperature was cooled to 30 degrees, and aortic crossclamp was applied, and  500 mL of retrograde blood cardioplegia was administered with diastolic  coolness of the heart.  A transverse aortotomy was performed.  Additional  cold blood cardioplegia was administered directly into the coronary ostium.  The patient had no calcification of the aortic valve.  The valve edges  rolled and did not coapt well.  The annulus was not particularly dilated.  The aortic valve was excised.  Pledgeted sutures were placed  circumferentially.  With this done, a 23 [employer] valve appeared to size  well.  This valve model R500 was selected and secured in place.  The valve  seated  well and had free movements of leaflets.  Care was taken.  Neither  the right or left coronary ostia were obstructed.  The aortotomy was then  closed with a horizontal mattress suture.  A Magoo needle was placed in the  ascending aorta to de-air the heart.  The heart was allowed to passively  fill and de-air.  The aortic cross-clamp was removed with total cross-clamp  time of 79 minutes.  The patient's body temperature was re-warmed to 37  degrees.  Transiently, she required AV pacing but prior to closing the chest  had returned to a sinus rhythm and was no longer paced.  She was ventilated.  The right superior pulmonary vein vent was removed.  A TEE showed good  function of the aortic valve without mitral regurgitation.   The patient was ventilated and weaned from  cardiopulmonary bypass without  difficulty.  She remained hemodynamically stable.  She was decannulated in  the usual fashion, and protamine sulfate was administered.  Atrial and  ventricular pacing wires had been applied.  The pericardium was loosely  reapproximated.  Two mediastinal tubes were left in place.  The sternum was  closed with a #6 stainless steel wire.  The fascia was closed with  interrupted 0 Vicryl, running 3-0 Vicryl in the subcutaneous tissue, and 4-0  subcuticular stitch in the skin edges.  Dry dressings were applied.  Sponge  and needle counts were reported as correct at the completion of the  procedure.  The patient tolerated the procedure without obvious complication  and was transferred to the surgical intensive care unit for further  postoperative care.      Lanelle Bal, MD  Electronically Signed     EG/MEDQ  D:  01/17/2005  T:  01/17/2005  Job:  [id number]   cc:   Quay Burow, M.D.  Fax: [phone]

## 2010-07-07 NOTE — Cardiovascular Report (Signed)
[facility]  Patient:    ELEEN, SCHAU Visit Number: [id number] MRN: [id number]          Service Type: CAT Location: [facility] 01 Attending Physician:  Berry, Jonathan [name] Dictated by:   [name], M.D. Proc. Date: 01/21/01 Admit Date:  01/21/2001   CC:         Cardiac Catheterization Laboratory  Doroteo Bradford. Huey Bienenstock, M.D.  [facility], [place] [address], [place], [place] [address]   Cardiac Catheterization  PROCEDURES PERFORMED: Cardiac catheterization.  INDICATIONS: The patient is a 60 year old, mildly overweight, married white female, with a history of lupus as well as Takayasu arteritis. She developed renal insufficiency and ACE inhibitor in the past. She has had left carotid endarterectomy performed by Dr. Mamie Nick. She is complaining of increasingly shortness of breath. A 2-D echocardiogram performed October 22, 2000, revealed normal left ventricular function with moderate AI. Cardiolite stress test performed January 08, 2001, suggested ischemia in the LAD territory +/- breast attenuation artifact. The patient presents now for diagnostic coronary arteriography and abdominal aortography.  DESCRIPTION OF PROCEDURE: The patient was brought to the second floor [facility] in the postabsorptive state. She was premedication with p.o. Valium. Her right groin was prepped and shaved in the usual sterile fashion.  Xylocaine 1% was used for local anesthesia.  A 6 French sheath was inserted into the right femoral artery using standard Seldinger technique.  A 6 French right and left Judkins diagnostic catheter as well as a 6 French pigtail catheter were used for selective coronary angiography, left ventriculography, and distal abdominal aortography. Supravalvular aortography was also performed in the RAO view. Omnipaque dye was used for the entirety of the case. Retrograde,  aortic, left ventricular, and pullback pressures were recorded.  HEMODYNAMICS: 1. Aortic systolic pressure [id number], diastolic pressure 69. 2. Left ventricular systolic pressure 0000000, diastolic pressure 29.  SELECTIVE CORONARY ANGIOGRAPHY: 1. Left main: Normal. 2. LAD: Normal. 3. Left circumflex: Normal. 4. Right coronary artery: Dominant and normal.  LEFT VENTRICULOGRAPHY: The RAO left ventriculogram was performed using 25 cc of Omnipaque dye at 12 cc/sec.  The overall LVEF was estimated at greater than 50% without focal wall motion abnormalities.  SUPRAVALVULAR AORTOGRAPHY: Supravalvular aortogram was performed in the RAO view using 15 cc of Omnipaque dye at 15 cc/sec. There was at least 2+ aortic insufficiency noted.  DISTAL ABDOMINAL AORTOGRAPHY: Distal abdominal aortogram was performed using 20 cc of Omnipaque dye at 20 cc/sec. x2. There appeared to be approximately 60% right renal artery stenosis. There were two small accessory left renal arteries. There was approximately a 40% infrarenal fairly focal abdominal aortic narrowing without a transstenotic gradient noted on pullback.  The iliac bifurcation appeared free of significant disease.  IMPRESSION: The patient has essentially normal coronary arteries and normal left ventricular function. I believe her Cardiolite stress test was falsely positive due to body habitus and breast attenuation. Her shortness of breath may be related to deconditioning +/- aortic insufficiency. She does have moderate right renal artery stenosis which we have performed Doppler and this does not appear to be hemodynamically significant. She also complains of claudication, though her infrarenal abdominal aortic stenosis likewise does not appear to be hemodynamically significant.  The sheaths were removed and pressure was held on the groin to achieve hemostasis. The patient left the lab in stable condition. She will be discharged home today as an  outpatient and will see me back  in the office in 2 weeks for followup. Dictated by:   [name], M.D. Attending Physician:  Berry, Jonathan [name] DD:  01/21/01 TD:  01/21/01 Job: [id number]

## 2010-07-07 NOTE — Op Note (Signed)
NAMEDEAMBER, [name]                           ACCOUNT NO.:  [id number]   MEDICAL RECORD NO.:  [id number]                   PATIENT TYPE:  INP   LOCATION:  0103                                 FACILITY[facility]   PHYSICIAN:  Vonna Kotyk. Durward Fortes, M.D.            DATE OF BIRTH:  11/29/1950   DATE OF PROCEDURE:  04/13/2002  DATE OF DISCHARGE:                                 OPERATIVE REPORT   PREOPERATIVE DIAGNOSIS:  Displaced trimalleolar fracture, right ankle.   POSTOPERATIVE DIAGNOSIS:  Displaced trimalleolar fracture, right ankle.   PROCEDURE:  ORIF.   SURGEON:  Vonna Kotyk. Durward Fortes, M.D.   ASSISTANT:  Evert Kohl, P.A.-C.   ANESTHESIA:  General orotracheal.   COMPLICATIONS:  None.   DESCRIPTION OF PROCEDURE:  The patient comfortable on the operating table  and under general orotracheal anesthesia, the right lower extremity was  prepped with DuraPrep from the tips of the toes to the ankle.  Sterile  draping was performed.  Thigh tourniquet had been applied to the ankle prior  to the drape and after elevation, it was Esmarch exsanguinated with the  tourniquet at 350 mmHg.   Initial incision was made obliquely over the medial malleolus and by sharp  dissection carried to subcutaneous tissue.  They by blunt dissection, the  soft tissue was elevated off the medial malleolus.  The fracture was  obviously displaced.  It was oblique in nature, extending from posterior-  inferior to superior and anterior.  The joint was explored.  There was some  chondromalacia of the talus _____, but there were no loose bodies.  The  joint was irrigated.  Under direct visualization, the fracture was reduced  anatomically and maintained with a K-wire.  Two 4.0 cancellous screws were  then inserted after appropriate drilling and measuring.  Each were 15 mm in  length, and washers were used.  We had excellent compression and no motion  across the fracture site after removal of the K-wire.   Longitudinal incision was made over the lateral malleolus via sharp  dissection, carried down to the subcutaneous tissue and by blunt dissection,  the soft tissue was elevated off the fibula fracture which was also  displaced.  It ran from posterior-superior to anterior-inferior.  It was  maintained with a bone clamp, and then a single cortical screw was inserted  for compression.  A seven hole semi-tubular sideplate was then affixed to  the fibula after bending it to conform to the shape of the fibula.  Two of  the three distal holes were filled with self-tapping cancellous screws.  Proximal four holes were filled with self-tapping cortical screws.  _______revealed good position of the fracture.  The ankle mortise and each  of the screws were well within the cortical bone.  There was a considerable  posterior plafond fracture, and it was felt that fixation would improve  stability.  Through the medial  malleolar wound, the drill hole was then made  and checked to be ______ in the axial position.  We measured a 40 mm screw,  and this 40 mm cortical screw with a washer was then achieved with excellent  purchase and nice compression, as noted by AP and lateral projection.   The wounds were then irrigated with saline solution, fascia closed with 3-0  Vicryl, skin closed with skin clips.  Marcaine 25% without epinephrine was  injected in the wound.  Sterile bulky dressing was applied followed by  posterior splints, and U-splint tourniquet was deflated with immediate  capillary refill of the toes.   The patient tolerated the procedure without complications.                                               Vonna Kotyk. Durward Fortes, M.D.    PWW/MEDQ  D:  04/13/2002  T:  04/13/2002  Job:  [id number]

## 2010-07-07 NOTE — Discharge Summary (Signed)
[name], [name]                 ACCOUNT NO.:  [id number]   MEDICAL RECORD NO.:  [id number]          PATIENT TYPE:  INP   LOCATION:  2005                         FACILITY:  [facility]   PHYSICIAN:  Lanelle Bal, MD    DATE OF BIRTH:  June 07, 1950   DATE OF ADMISSION:  01/17/2005  DATE OF DISCHARGE:  01/23/2005                                 DISCHARGE SUMMARY   PRIMARY ADMITTING DIAGNOSIS:  Shortness of breath.   ADDITIONAL/DISCHARGE DIAGNOSES:  1 Worsening aortic insufficiency.  1.  Lupus.  2.  Hypertension.  3.  Mesenteric and renal artery stenosis.  4.  History of left carotid stenosis.  5.  Hypertension.  6.  Asthma.  7.  Peripheral vascular occlusive disease.  8.  History of right ankle fracture.  9.  Postoperative anemia.   PROCEDURE:  Aortic valve replacement with 23 mm [employer] mechanical  aortic valve size 23 mm.   HISTORY OF PRESENT ILLNESS:  The patient is a 60 year old white female with  a history of aortic insufficiency who has been followed by Dr. Gwenlyn Found since  2000.  Recently the patient went in for her yearly checkup and a 2-D  echocardiogram was performed which showed a decrease in her ejection  fraction to 40-50% with global hypokinesis and a left ventricular diastolic  dimension of 60 mm and a systolic dimension of 49 mm with moderate pulmonary  hypertension.  The left atrium was mildly to moderately dilated and there  was moderate mitral regurgitation with mild tricuspid regurgitation and  moderate aortic regurgitation.  The patient notes that over the past year to  18 months she has had increasing fatigue, decreased exercise tolerance and  increasing shortness of breath. Once she was referred to Dr. [name] for  further evaluation. He felt that she would most likely benefit from aortic  valve replacement at this time.  However in light of her other comorbidities  particularly her peripheral vascular disease he felt that prior to  proceeding with any open  heart surgery she should be evaluated by Dr. Kellie Simmering  for any possible vascular intervention.  In the meantime she was seen by Dr.  Kellie Simmering and was found on carotid duplex to have a normal right internal  carotid arteries, no stenosis as well as occlusion of the entire carotid  system on the left which was unchanged.  She also had equal pressures in her  arms with antegrade vertebral flow.  She had no symptoms of cerebral or  mesenteric ischemia and he recommended an abdominal SMA and renal MR  angiogram.  This was performed and she was found to have 60-70% stenosis of  the single right renal artery and critical stenosis at the origin of the  SMA.  After review of her films Dr. Kellie Simmering felt that she would be safe to  proceed with her cardiac surgery and to follow up with him for further  evaluation of her peripheral vascular disease.   HOSPITAL COURSE:  She was admitted to [facility] on January 17, 2005 and was taken to the operating room where she  underwent aortic valve  replacement as described and detailed above performed by Dr. [name].  She  tolerated procedure well and was transferred to the SICU in stable  condition.  Postoperatively she has done very well.  She was able to be  extubated shortly after surgery.  She was hemodynamically stable and doing  well on postop day one.  She did initially require a low dose Neo-Synephrine  drip but this was weaned and discontinued over her first postoperative day.  She was kept in the unit and her chest tubes and hemodynamic monitoring  devices were removed.  She was slowly mobilized.  By postop day two she was  ready for transfer to the floor.  She has been started on Coumadin for her  mechanical aortic valve and presently her INR is therapeutic 2.0 with a PT  of 22.5.  She has been ambulating with cardiac rehab phase one and is making  good progress.  Her blood pressure has began to trend upward and she has  been restarted on her  home dose of Altace with good control.  Her surgical  incision sites are all healing well.  She is mildly volume overloaded  presently about three pounds above her preoperative weight.  She has been  restarted on home dose of Lasix and is diuresing well.  She is tolerating a  regular diet and is having normal bowel and bladder function.  Her labs have  remained stable and her most recent values showed a hemoglobin of 8.6 and  hematocrit 24.9 which has remained stable and for which she has been started  on iron supplementation.  White blood cell count 6.6, platelet count of 151.  Electrolyte panel showed sodium 136, potassium 3.8 which has been  supplemented, BUN 13, creatinine 1.0.  Her most recent chest x-ray showed  small right pleural effusion and basilar atelectasis which is improving as  well.  It is felt that since she has remained stable and is progressing well  and her INR is therapeutic at this time she may be discharged home on  January 23, 2005.   DISCHARGE MEDICATIONS:  1.  Coumadin 5 mg on the day of discharge to be alternated every other day      with 2.5 mg until her blood work is checked.  2.  Nu-Iron 150 mg daily.  3.  Altace 5 mg daily.  4.  Zocor 20 mg daily.  5.  Prednisone 5 mg daily.  6.  Wellbutrin XL 3 mg daily.  7.  Advair 500/50 one puff b.i.d.  8.  Lasix 40 mg daily.  9.  Caltrate 600 mg b.i.d.  10. Multivitamin daily.  11. Hydroxychloroquine 200 mg b.i.d.  12. Tylox 1-2 q.4h. p.r.n. for pain.   DISCHARGE INSTRUCTIONS:  She is asked to refrain from driving, heavy lifting  or strenuous activity.  She may continue ambulating daily and using her  incentive spirometer.  She may shower daily and clean her incisions with  soap and water.  She will continue low-fat, low-sodium diet.   DISCHARGE FOLLOWUP:  She is asked to make an appointment see Dr. Gwenlyn Found in  two weeks.  She is also asked to have a PT and INR drawn at Dr. Kennon Holter office on Thursday, December 7.   She will follow up with Dr. [name] in  three weeks and will have a chest x-ray at [facility] one hour  prior to that appointment.  Our office will contact her with the appointment  and she may call in the interim if she experiences any problems or has  questions.      Suzzanne Cloud, P.A.      Lanelle Bal, MD  Electronically Signed    GC/MEDQ  D:  01/23/2005  T:  01/23/2005  Job:  [id number]   cc:   Quay Burow, M.D.  Fax: [phone]   Dr. Laurette Schimke at [place]

## 2010-07-07 NOTE — Op Note (Signed)
NAMEWILMETTA, [name]                 ACCOUNT NO.:  [id number]   MEDICAL RECORD NO.:  [id number]          PATIENT TYPE:  INP   LOCATION:  2399                         FACILITY:  [facility]   PHYSICIAN:  Ala Dach, M.D.DATE OF BIRTH:  November 20, 1950   DATE OF PROCEDURE:  01/17/2005  DATE OF DISCHARGE:                                 OPERATIVE REPORT   PROCEDURE:  Transesophageal echocardiogram.   INDICATIONS FOR PROCEDURE:  [name] is a 60 year old female patient of Dr.  Everrett Coombe who presents today for aortic valve replacement, due to primarily  aortic insufficiency.  She was brought to the holding area the morning of  surgery, where under local anesthesia with sedation, the pulmonary catheter  and radial arterial lines were started.  She was taken to the OR for routine  induction with general anesthesia after which the trachea was intubated.  Following this the T-probe is passed the full length of the stomach, and  then slightly withdrawn for imaging of the cardiac structures.   PRECARDIOPULMONARY BYPASS TEE EXAMINATION:  1.  Left ventricle.  This is a mildly dilated ventricular chamber with a      short axis, diastolic diameter of about 5.5 cm.  Overall in the short      axis view there is good overall contractility.  Normal chamber wall      thickness is noted.  Excellent ejection fraction by assessment.      Papillary muscles are well outlined.  No masses noted within.  2.  Mitral valve.  Mildly thickened mitral valve apparatus, appears normal      in its function; opening during diastole and closing during systolic      ejection.  Close examination both anterior and posterior leaflets      revealed them to be essentially normal.  Doppler examination appears to      show just trace mitral regurgitant flow.  3.  Left atrium.  The left atrial chamber is normal, no masses are noted      within.  The interatrial septum is interrogated, again, no masses are      noted.  4.  Aortic  valve.  Thin, compliant, mobile, 3-leaflet, trileaflet aortic      valve is seen in a short axis view. However, on diastolic and a short      axis view reveals lack of coaptation centrally with a small several mm      opening as well as a lack of coaptation at the periphery between the      right and noncoronary cusp.  This is associated, on color Doppler with      aortic regurgitant flow noted in that central area as well as a lateral      area jet noted in the area between the noncoronary and the right      coronary cusp of the aortic valve.  Long axis views, again reveal a      fairly large regurgitant jet noted.  Measurements in the left      ventricular outflow tract reveal a left ventricle outflow tract diameter  of about 2.1 associated with a jet diameter at that same area of about      0.8 to 0.9.  This would be consistent with about a 35-38% width of the      aortic regurgitant jet compared to the left ventricular outflow cross      sectional area.  This would be characteristic of about moderate to      aortic insufficiency.  This aortic insufficiency jet was somewhat      eccentric in its placement as it went under the anterior leaflet of the      mitral valve.  The eccentricity of the jettison __________ is associated      with an under reporting of the actual size of the regurgitant jet.      Multiple images are obtained, again, revealing the primary lesion as      aortic insufficiency.  There does not appear to be any obstruction to      the flow though it would be characteristic of aortic stenosis.      Therefore, this is ruled out.  5.  Right ventricular chamber.  Normal right ventricular chamber and      function.  6.  Tricuspid valve thin, compliant, mobile.  Tricuspid valve apparatus      __________ examination reveals trace regurgitant flow.  7.  Right atrium.  Normal right atrial chamber.  Pulmonary catheter noted      within.   The patient is placed on  cardiopulmonary bypass, hypothermia is instituted.  The diseased aortic valve is removed and replaced with a Cardiomatic  mechanical valve.  The airing maneuvers were carried out.  The patient is  rewarmed, separated from cardiopulmonary bypass with the initial attempt.   POSTCARDIOPULMONARY BYPASS EXAMINATION:  Left ventricle.  The left  ventricular chamber shows good-to-satisfactory contractile pattern in the  post bypass.  Again, chamber size remains the same. Wall thickness remains  the same.  Good overall ejection fraction is noted in both short and long  axis views.   In place of the diseased aortic valve could not be seen, the placement of  the  CardioMatic's valve in the short axis view.  The leaflet edges were  able to be seen and appeared to open appropriately during systolic  contraction.  On diastole there was a trace, central aortic regurgitant jet  which is sometimes seen with these valves, centrally located and small.  There does not appear to be any obstruction to systolic ejection.  This  appears to be a completely satisfactory replacement of a mechanical aortic  valve.   The rest of the cardiac examination was as previously described without any  significant changes.  The patient was taken to the cardiac intensive care  unit in stable condition.           ______________________________  Ala Dach, M.D.     JTM/MEDQ  D:  01/17/2005  T:  01/18/2005  Job:  [id number]

## 2010-07-07 NOTE — H&P (Signed)
[name], [name]                 ACCOUNT NO.:  [id number]   MEDICAL RECORD NO.:  [id number]          PATIENT TYPE:  INP   LOCATION:  NA                           FACILITY:  [facility]   PHYSICIAN:  Lanelle Bal, MD    DATE OF BIRTH:  Jul 18, 1950   DATE OF ADMISSION:  DATE OF DISCHARGE:                                HISTORY & PHYSICAL   CHIEF COMPLAINT:  Shortness of breath with exertion.   HISTORY OF PRESENT ILLNESS:  The patient is a 60 year old Caucasian female  who has a history of aortic insufficiency which is followed by Dr. Gwenlyn Found.  This has been followed dating back to 2000. Recently, the patient went in  for her yearly check up and a 2-D echocardiogram was done at this time. This  showed her ejection fraction to have decreased 40-50% with global  hypokinesis with a left ventricular diastolic dimension of 60 mm and a  systolic dimension of 49 mm with a moderate pulmonary hypertension estimated  at 40-50. The left atrium was mildly to moderately dilated. There is  moderate mitral regurgitation with mild tricuspid regurgitation, mildly  sporadic aortic valve with moderate aortic regurgitation. Noted the patient  had a cardiac catheterization dating back to December 2002 which at that  time showed essentially normal coronary arteries and normal left ventricular  function. Due to the patient's history of left carotid stenosis and  undergoing left common internal carotid artery bypass with a saphenous vein  graft from her left leg, the patient was seen and evaluated by Dr. Kellie Simmering  for clearance. Carotid duplex exam was done and revealed normal internal  carotid flow on the right with no stenosis and occlusion of the entire  carotid system on the left which is unchanged from prior. The patient also  has equal pressures in her arms with antegrade vertebral flow. Dr. Kellie Simmering  had ordered an abdominal and renal MR angiogram to be done which was done on  October 26, 2004, showing left  kidney to be atrophic. There is 60-70%  stenosis of the single right renal artery. Critical stenosis at the origin  of SMA. There is hypertrophy of the IMA contributing to the __________ by  the SMA territory. Following evaluation by Dr. Kellie Simmering, the patient was  cleared to proceed with cardiac surgery. Today, she presents for her history  and physical. The patient states that she has been experiencing shortness of  breath with exertion. Denies any shortness of breath at rest. Denies any  syncopal episodes. She does complain of swelling bilateral lower extremities  which has been persistent over the past several years. She denies any chest  pain or PND, orthopnea, cough, sputum production, diaphoresis, TIA/CVA  symptoms, palpitations, history of arrhythmias, or fatigue.   PAST MEDICAL HISTORY:  1.  Lupus.  2.  Hypertension.  3.  Asthma.  4.  History of peripheral vascular disease.  5.  History of right renal artery stenosis and SMA stenosis.  6.  History of right ankle fracture.  7.  History of left carotid stenosis.   PAST SURGICAL HISTORY:  1.  Status post cholecystectomy.  2.  Status post tubal ligation bilaterally.  3.  Status post left common internal carotid artery bypass grafting using      saphenous vein from left leg.  4.  Status post right ankle surgery.  5.  Status post D&C.   ALLERGIES:  No known drug allergies.   MEDICATIONS:  1.  Advair Discus 500/50 one puff b.i.d.  2.  Altace 5 mg daily.  3.  Adalat 60 mg daily.  4.  Prednisone 5 mg daily.  5.  Zocor 20 mg daily.  6.  Hydroxychloroquine 200 mg b.i.d.  7.  Lasix  40 mg daily.  8.  Aspirin 81 mg daily.  9.  Centrum Silver daily.  10. Caltrate 600 + daily.  11. Wellbutrin XL 300 mg daily.   SOCIAL HISTORY:  The patient currently married with three children. Lives at  home with her family. The patient denies any tobacco, alcohol, or illicit  drug use. She is retired, living here in [place], [place].   FAMILY HISTORY:  The patient denies any family history in father or mother.  States both are still living and healthy.   REVIEW OF SYSTEMS:  See HPI for pertinent positives and negatives.  Otherwise, the review of systems within normal limits.   PHYSICAL EXAMINATION:  GENERAL:  Obese white female in no acute distress.  VITAL SIGNS:  Blood pressure 150/60, heart rate 89, respirations at 18,  temperature of 97.3.  HEENT:  Normocephalic, atraumatic. Pupils equal, round, and reactive to  light and accommodation. Extraocular movements intact. Oral mucosa is pink  and moist.  NECK:  Supple. There is occlusion of the left carotid system. No left  carotid pulse noted. There is radiation of the systolic ejection murmur  noted in the right carotid.  RESPIRATORY:  Clear to auscultation bilaterally.  CARDIAC:  Regular rate and rhythm with S1 and S2 noted. There is a 3/6  systolic ejection murmur noted.  ABDOMEN:  Bowel sounds x4. Soft, nontender to palpation. No  hepatosplenomegaly noted.  GENITALIA:  Deferred.  RECTAL:  Deferred.  EXTREMITIES:  The patient has 3+ bilateral pitting edema noted in the lower  extremities. No cyanosis, clubbing noted. The patient's bilateral upper and  lower extremities are warm and well perfused. Bilateral 2+ radial, femoral,  DP, and PT pulses noted.  NEUROLOGIC:  Cranial nerves II-XII intact. The patient is alert and oriented  x4. Gait is steady. Muscle strength noted 5/5 upper and lower extremities  bilaterally. Deep tendon reflexes are 2+ bilateral.   IMPRESSION AND PLAN:  The patient is seen with aortic insufficiency which  has been progressively getting worse. The patient also seen with moderate  mitral regurgitation. She has been seen and evaluated by Dr. [name]. Dr.  [name] has discussed with the patient undergoing aortic valve replacement  using a mechanical valve. He did discuss the use of Coumadin with the mechanical valve. We will  plan to obtain a TEE during surgery to evaluate  the  mitral valve for possible mitral valve repair. Dr. [name] discussed with  the patient risks and benefits of this procedure. The patient acknowledges  her understanding.   The plan is for aortic valve replacement with possible mitral valve repair  on January 17, 2005, by Dr. [name].      Darlin Coco, [place]      Lanelle Bal, MD  Electronically Signed    KMD/MEDQ  D:  01/15/2005  T:  01/15/2005  Job:  [id number] 

## 2010-07-07 NOTE — Assessment & Plan Note (Signed)
[name]                             PULMONARY OFFICE NOTE   NAME:[name], [name] MITTAG                        MRN:          [id number]  DATE:05/21/2006                            DOB:          1950-05-20    PROBLEM:  1. Asthma.  2. Lupus.  3. Aortic valve replacement/Coumadin.   HISTORY:  One year followup.  Still mild chest congestion following a  flu syndrome this winter, which she thinks she is getting over it now on  her own.  No bad asthma flares.  She has been using Advair 500/50 mg,  but just once daily.  We discussed options for this.  There has not been  a significant seasonal rhinitis.   MEDICATIONS:  1. Advair 500/50 mg.  2. Altace 5 mg.  3. Adalat 60 mg.  4. Prednisone 5 mg daily for lupus.  5. Hydro-chlor 200 mg.  6. Furosemide 40 mg.  7. Caltrate 600 mg.  8. Multivitamin.  9. Zocor 20 mg.  10.Coumadin.  11.Iron.  12.P.r.n. use of a rescue Albuterol inhaler.   ALLERGIES:  No medication allergy.   OBJECTIVE:  Weight 264 pounds, which is up from 246 pounds a year ago,  blood pressure 132/70, pulse regular 83, room air saturation 97%.  There is a crisp valve click with no murmur heard.  Lungs are clear.  Pharynx is clear.  There is no peripheral edema.   IMPRESSION:  Asthma is clinically stable for now as she finishes  resolving a viral syndrome bronchitis this winter, possibly a flu.   PLAN:  Try reducing Advair from 500 to 250/50 mg.  Start by using it one  puff b.i.d.  If very stable she can then drop down to maintenance of one  puff daily.  Again, continuing mouth rinses.  Schedule return in one  year, but earlier p.r.n.     Clinton D. Annamaria Boots, MD, [place], FACP  Electronically Signed    CDY/MedQ  DD: 05/21/2006  DT: 05/22/2006  Job #: [id number]   cc:   Lanice Shirts, M.D.

## 2010-07-07 NOTE — Discharge Summary (Signed)
[name], [name]                           ACCOUNT NO.:  [id number]   MEDICAL RECORD NO.:  [id number]                   PATIENT TYPE:  INP   LOCATION:  0481                                 FACILITY:  [facility]   PHYSICIAN:  Vonna Kotyk. Durward Fortes, M.D.            DATE OF BIRTH:  November 17, 1950   DATE OF ADMISSION:  04/13/2002  DATE OF DISCHARGE:  04/16/2002                                 DISCHARGE SUMMARY   ADMISSION DIAGNOSES:  1. Right hip trimalleolar fracture.  2. Lupus.  3. Hypertension.  4. Asthma.  5. Peripheral vascular disease.  6. Questionable renal artery stenosis.   DISCHARGE DIAGNOSES:  1. Open reduction internal fixation of right trimalleolar fracture of the     ankle.  2. Lupus.  3. Hypertension.  4. Asthma.  5. Questionable renal artery disease.  6. Peripheral vascular disease.   HISTORY OF PRESENT ILLNESS:  The patient is a 60 year old white female who  fell down five steps on the date of admission.  The patient reports that  after fall she was unable to weight bear.  She noted some deformity and  swelling of the ankle.  She was brought to the emergency room for evaluation  which revealed a dislocated right trimalleolar ankle fracture.   SURGICAL PROCEDURE:  On April 13, 2002 patient was taken to the OR by  Vonna Kotyk. Durward Fortes, M.D., assisted by Evert Kohl, P.A.  Under general  anesthesia the patient underwent an open reduction internal fixation a  displaced right ankle trimalleolar fracture.  The patient tolerated the  procedure well.  There were no complications and the patient was transferred  to the recovery room and then to the orthopedic floor in good condition.   CONSULTS:  1. Routine physical therapy consult was requested.  2. Cardiac consult was requested for preoperative medical clearance.   HOSPITAL COURSE:  On April 13, 2002 patient was admitted through the  emergency room to Vonna Kotyk. Durward Fortes, M.D. service.  The patient was  evaluated  preoperatively by cardiac services for her cardiac history for  clearance for an ORIF of her ankle.  The patient was cleared and the patient  was then taken to the OR for an ORIF of her right ankle which was performed  without any complications.   The patient then incurred a total of three days postoperative care in the  orthopedic floor in which the patient had no untoward events, no cardiac  complications or any further medical issues.  The patient's pain was  initially managed with IV PCA and p.o. medications.  On postoperative day  number two patient was transitioned over to straight p.o. medications.  The  patient had well controlled pain with these medications.  The patient worked  well with physical therapy and on postoperative day number three patient was  felt ready to be discharged home with recommendation for follow-up and home  health physical  therapy.  The patient was discharged with her vital signs  stable.  Her right lower extremity distal foot was neuro/motor vascularly  intact with an appropriate fitting posterior splint to the right ankle.   LABORATORIES:  CBC on admission:  WBC 7.7, hemoglobin 11.7, hematocrit 34.2,  platelets 181,000.  Routine chemistries:  Sodium 136, potassium 3.3, glucose  107, BUN 4, creatinine 1.0.   DISCHARGE MEDICATIONS:  1. Advair one puff q.12h.  2. Prednisone 5 mg p.o. q.Wednesdays.  3. Premarin 0.625 mg p.o. daily.  4. Procardia 60 mg p.o. daily.  5. Provera 10 mg p.o. daily.  6. Altace 5 mg p.o. daily.  7. Plaquenil 200 mg p.o. b.i.d.  8. Aspirin 81 mg p.o. daily.  9. Calcium carbonate 500 mg p.o. daily.  10.      Ferrous sulfate 325 mg p.o. daily.  11.      Lasix 40 mg p.o. daily.  12.      Xanax 0.25 mg p.o. q.h.s.  13.      Percocet one to two tablets q.4-6h. p.r.n.  14.      Robaxin 1000 mg p.o. t.i.d. p.r.n.   DISCHARGE INSTRUCTIONS:  Medications:  The patient is to resume routine home  medications.  Percocet 5 mg one or two  tablets q.4-6h. p.r.n. pain.  Activity:  The patient may be touchdown weightbearing only with the use of a  walker and she may ambulate as tolerated.  Diet:  No restrictions.  Wound  care:  The patient is to keep dressing and splint clean and dry.  The  patient is to call Vonna Kotyk. Whitfield, M.D. if she notes a temperature  greater than 101, fevers, chills, foul smelling discharge from wound.   FOLLOW UP:  The patient should call [phone] for a follow-up appointment in  one week with Vonna Kotyk. Durward Fortes, M.D.   CONDITION ON DISCHARGE:  Listed as improved and good.     Evert Kohl, P.A.                      Vonna Kotyk. Durward Fortes, M.D.    RWK/MEDQ  D:  04/16/2002  T:  04/16/2002  Job:  [id number]

## 2010-08-17 ENCOUNTER — Other Ambulatory Visit: Payer: Self-pay | Admitting: Internal Medicine

## 2010-08-21 ENCOUNTER — Telehealth: Payer: Self-pay | Admitting: Internal Medicine

## 2010-08-21 MED ORDER — FLUTICASONE-SALMETEROL 250-50 MCG/DOSE IN AEPB: 1.0000 | INHALATION_SPRAY | Freq: Two times a day (BID) | RESPIRATORY_TRACT | Status: DC

## 2010-08-21 NOTE — Telephone Encounter (Signed)
Spoke with pt and notified that rx was sent to pharm, and needs to keep her appt for future refills. Pt verbalized understanding.

## 2010-09-29 ENCOUNTER — Encounter: Payer: Self-pay | Admitting: *Deleted

## 2010-10-03 ENCOUNTER — Ambulatory Visit (INDEPENDENT_AMBULATORY_CARE_PROVIDER_SITE_OTHER): Payer: 59 | Admitting: Internal Medicine

## 2010-10-03 ENCOUNTER — Encounter: Payer: Self-pay | Admitting: Internal Medicine

## 2010-10-03 VITALS — BP 112/62 | HR 75 | Ht 65.0 in | Wt 253.0 lb

## 2010-10-03 DIAGNOSIS — J45909 Unspecified asthma, uncomplicated: Secondary | ICD-10-CM

## 2010-10-03 DIAGNOSIS — Z9289 Personal history of other medical treatment: Secondary | ICD-10-CM

## 2010-10-03 HISTORY — DX: Personal history of other medical treatment: Z92.89

## 2010-10-03 MED ORDER — FLUTICASONE-SALMETEROL 250-50 MCG/DOSE IN AEPB: 1.0000 | INHALATION_SPRAY | Freq: Two times a day (BID) | RESPIRATORY_TRACT | Status: DC

## 2010-10-03 NOTE — Progress Notes (Signed)
Subjective:    Patient ID: [name], female    DOB: [birth year], 60 y.o.   MRN: [id number]  HPI 10/03/10-60 year old female never smoker followed for asthma complicated by past history of lupus and aortic valve replacement. Last here April 22, 2009-she responded well with Xopenex nebulizer treatment and Depo-Medrol injection. She reports a good year with no special problems. No colds and no interventions. She keeps a little dry cough which she minimizes. We discussed ACEI Altace.  Continues Advair bid and she never filled Proair rescue script last year.  She had 2011 flu shot and has had pneumonia vaccine in the past but not sure when. Review of Systems Constitutional:   No-   weight loss, night sweats, fevers, chills, fatigue, lassitude. HEENT:   No-  headaches, difficulty swallowing, tooth/dental problems, sore throat,       No-  sneezing, itching, ear ache, nasal congestion, post nasal drip,  CV:  No-   chest pain, orthopnea, PND, swelling in lower extremities, anasarca,dizziness, palpitations Resp: No-   shortness of breath with exertion or at rest.              No-   productive cough,  No non-productive cough,  No-  coughing up of blood.              No-   change in color of mucus.  No- wheezing.   Skin: No-   rash or lesions. GI:  No-   heartburn, indigestion, abdominal pain, nausea, vomiting, diarrhea,                 change in bowel habits, loss of appetite GU: No-   dysuria, change in color of urine, no urgency or frequency.  No- flank pain. MS:  No-   joint pain or swelling.  No- decreased range of motion.  No- back pain. Neuro- grossly normal to observation, Or:  Psych:  No- change in mood or affect. No depression or anxiety.  No memory loss.      Objective:   Physical Exam General- Alert, Oriented, Affect-appropriate, Distress- none acute;  obese Skin- rash-none, lesions- none, excoriation- none Lymphadenopathy- none Head- atraumatic            Eyes- Gross vision  intact, PERRLA, conjunctivae clear secretions            Ears- Hearing, canals normal            Nose- Clear, no-Septal dev, mucus, polyps, erosion, perforation             Throat- Mallampati II , mucosa clear , drainage- none, tonsils- atrophic Neck- flexible , trachea midline, no stridor , thyroid nl, carotid no bruit Chest - symmetrical excursion , unlabored           Heart/CV- RRR , +crisp valve sounds, no gallop  , no rub, nl s1 s2                           - JVD- none , edema- none, stasis changes- none, varices- none           Lung- clear to P&A, wheeze- none, cough- none , dullness-none, rub- none           Chest wall-  Abd- tender-no, distended-no, bowel sounds-present, HSM- no Br/ Gen/ Rectal- Not done, not indicated Extrem- cyanosis- none, clubbing, none, atrophy- none, strength- nl Neuro- grossly intact to observation  Assessment & Plan:

## 2010-10-03 NOTE — Patient Instructions (Signed)
Advair refilled. Try using Advair just one puff, once daily and see if that holds you comfortably. If not just go back up to twice daily.

## 2010-10-03 NOTE — Assessment & Plan Note (Signed)
Mild intermittent asthma  Well controlled Plan- reduce Advair to once daily and watch stability

## 2011-10-01 ENCOUNTER — Ambulatory Visit: Payer: 59 | Admitting: Internal Medicine

## 2011-11-06 ENCOUNTER — Encounter: Payer: Self-pay | Admitting: Internal Medicine

## 2011-11-06 ENCOUNTER — Ambulatory Visit (INDEPENDENT_AMBULATORY_CARE_PROVIDER_SITE_OTHER): Payer: 59 | Admitting: Internal Medicine

## 2011-11-06 VITALS — BP 114/70 | HR 86 | Ht 65.0 in | Wt 256.0 lb

## 2011-11-06 DIAGNOSIS — J309 Allergic rhinitis, unspecified: Secondary | ICD-10-CM

## 2011-11-06 DIAGNOSIS — J45909 Unspecified asthma, uncomplicated: Secondary | ICD-10-CM

## 2011-11-06 DIAGNOSIS — J4520 Mild intermittent asthma, uncomplicated: Secondary | ICD-10-CM

## 2011-11-06 DIAGNOSIS — M329 Systemic lupus erythematosus, unspecified: Secondary | ICD-10-CM

## 2011-11-06 DIAGNOSIS — Z23 Encounter for immunization: Secondary | ICD-10-CM

## 2011-11-06 DIAGNOSIS — J302 Other seasonal allergic rhinitis: Secondary | ICD-10-CM

## 2011-11-06 MED ORDER — AZELASTINE-FLUTICASONE 137-50 MCG/ACT NA SUSP: 2.0000 | Freq: Every day | NASAL | Status: DC

## 2011-11-06 MED ORDER — OLMESARTAN MEDOXOMIL 20 MG PO TABS: 20.0000 mg | ORAL_TABLET | Freq: Every day | ORAL | Status: DC

## 2011-11-06 NOTE — Patient Instructions (Addendum)
Instead of your antihistamine, fluticasone nasal spray and decongestant, try using:   Sample Dymista nasal spray  1-2 puffs each nostril once daily at bedtime.  Given samples of Benicar/ (ARB) 20 mg instead of ramipril.  Use 1/2 tab daily (10 mg) x 1 month and notice if the dry cough improves significantly.   Flu vax

## 2011-11-06 NOTE — Progress Notes (Signed)
Subjective:    Patient ID: [name], female    DOB: 1950/06/27, 61 y.o.   MRN: [id number]  HPI 10/03/10-61 year old female never smoker followed for asthma complicated by past history of lupus and aortic valve replacement. Last here April 22, 2009-she responded well with Xopenex nebulizer treatment and Depo-Medrol injection. She reports a good year with no special problems. No colds and no interventions. She keeps a little dry cough which she minimizes. We discussed ACEI Altace.  Continues Advair bid and she never filled Proair rescue script last year.  She had 2011 flu shot and has had pneumonia vaccine in the past but not sure when.  11/06/11- 61 year old female never smoker followed for asthma complicated by past history of lupus and aortic valve replacement. Cough at times-went to PCP/ [facility]-? altace or procardia causing this.  Has been doing very well using Advair just once daily with no season change. Describes a couple of months of dry cough. On prednisone 5 mg daily for lupus. Continues Flonase. Ears frequently stop up.  Review of Systems- see HPI Constitutional:   No-   weight loss, night sweats, fevers, chills, fatigue, lassitude. HEENT:   No-  headaches, difficulty swallowing, tooth/dental problems, sore throat,       No-  sneezing, itching, +ear pressure, + controlled nasal congestion, post nasal drip,  CV:  No-   chest pain, orthopnea, PND, swelling in lower extremities, anasarca,dizziness, palpitations Resp: No-   shortness of breath with exertion or at rest.              No-   productive cough, + non-productive cough,  No-  coughing up of blood.              No-   change in color of mucus.  No- wheezing.   Skin: No-   rash or lesions. GI:  No-   heartburn, indigestion, abdominal pain, nausea, vomiting,  GU:  MS:  No-   joint pain or swelling.   Neuro- nothing unusual  Psych:  No- change in mood or affect. No depression or anxiety.  No memory loss.  Objective:   Physical Exam General- Alert, Oriented, Affect-appropriate, Distress- none acute;  obese Skin- rash-none, lesions- none, excoriation- none Lymphadenopathy- none Head- atraumatic            Eyes- Gross vision intact, PERRLA, conjunctivae clear secretions            Ears- Hearing, canals normal            Nose- Clear, no-Septal dev, mucus, polyps, erosion, perforation             Throat- Mallampati III , mucosa clear , drainage- none, tonsils- atrophic Neck- flexible , trachea midline, no stridor , thyroid nl, carotid no bruit Chest - symmetrical excursion , unlabored           Heart/CV- RRR , +crisp valve sounds,  1/6 Systolic M, no gallop  , no rub, nl s1 s2                           - JVD- none , edema- none, stasis changes- none, varices- none           Lung- clear to P&A, wheeze- none, cough- none , dullness-none, rub- none           Chest wall-  Abd-  Br/ Gen/ Rectal- Not done, not indicated Extrem- cyanosis- none, clubbing, none, atrophy- none,  strength- nl Neuro- grossly intact to observation  Assessment & Plan:

## 2011-11-08 DIAGNOSIS — Z9289 Personal history of other medical treatment: Secondary | ICD-10-CM

## 2011-11-08 HISTORY — DX: Personal history of other medical treatment: Z92.89

## 2011-11-14 DIAGNOSIS — J302 Other seasonal allergic rhinitis: Secondary | ICD-10-CM | POA: Insufficient documentation

## 2011-11-14 NOTE — Assessment & Plan Note (Signed)
I don't think lupus or the low dose prednisone are impacting her rhinitis or asthma adversely.

## 2011-11-14 NOTE — Assessment & Plan Note (Signed)
Advair had been controlling well. She has not recognized mold problems this summer despite all the rain. I suspect this is an ACE inhibitor related cough. Plan-try changing ramipril  for one month to samples of Benicar 1/2x20 mg tablet daily and try a sample of Dymista nasal spray.

## 2011-11-14 NOTE — Assessment & Plan Note (Signed)
Plan-sample dymista nasal spray

## 2011-11-30 ENCOUNTER — Other Ambulatory Visit: Payer: Self-pay | Admitting: *Deleted

## 2011-11-30 MED ORDER — FLUTICASONE-SALMETEROL 250-50 MCG/DOSE IN AEPB: 1.0000 | INHALATION_SPRAY | Freq: Two times a day (BID) | RESPIRATORY_TRACT | Status: DC

## 2012-04-12 HISTORY — PX: EXPLORATORY LAPAROTOMY: SUR591

## 2012-05-09 ENCOUNTER — Ambulatory Visit: Payer: Self-pay | Admitting: Cardiovascular Disease

## 2012-05-09 DIAGNOSIS — Z7901 Long term (current) use of anticoagulants: Secondary | ICD-10-CM | POA: Insufficient documentation

## 2012-05-09 DIAGNOSIS — Z954 Presence of other heart-valve replacement: Secondary | ICD-10-CM

## 2012-05-09 DIAGNOSIS — Z5181 Encounter for therapeutic drug level monitoring: Secondary | ICD-10-CM | POA: Insufficient documentation

## 2012-07-03 ENCOUNTER — Telehealth: Payer: Self-pay | Admitting: Cardiovascular Disease

## 2012-07-03 NOTE — Telephone Encounter (Signed)
Need new prescription for Hydroxychlor 200mg  #60-call to [facility] [phone]!

## 2012-07-04 ENCOUNTER — Other Ambulatory Visit: Payer: Self-pay | Admitting: *Deleted

## 2012-07-04 MED ORDER — HYDROXYCHLOROQUINE SULFATE 200 MG PO TABS: 200.0000 mg | ORAL_TABLET | Freq: Two times a day (BID) | ORAL | Status: DC

## 2012-07-08 ENCOUNTER — Telehealth: Payer: Self-pay | Admitting: Cardiovascular Disease

## 2012-07-08 NOTE — Telephone Encounter (Signed)
Upcoming ileostomy takedown by Dr Arvin Collard, not scheduled yet.  [name], from Dr. Arvin Collard' office, will let us know the date of the procedure.  Will forward to [facility] D.

## 2012-07-08 NOTE — Telephone Encounter (Signed)
Message forwarded to K. Vogel, RN.  

## 2012-07-08 NOTE — Telephone Encounter (Signed)
Scheduled for procedure after 08-07-12-Will require Lovenox Bridge!

## 2012-07-10 LAB — PROTIME-INR: INR: 3.6 — AB (ref <=1.1)

## 2012-07-15 ENCOUNTER — Ambulatory Visit (INDEPENDENT_AMBULATORY_CARE_PROVIDER_SITE_OTHER): Payer: Medicare Other | Admitting: Pharmacist Clinician (PhC)/ Clinical Pharmacy Specialist

## 2012-07-15 DIAGNOSIS — Z954 Presence of other heart-valve replacement: Secondary | ICD-10-CM

## 2012-07-15 DIAGNOSIS — Z7901 Long term (current) use of anticoagulants: Secondary | ICD-10-CM

## 2012-08-12 LAB — PROTIME-INR: INR: 3 — AB (ref <=1.1)

## 2012-08-13 ENCOUNTER — Ambulatory Visit (INDEPENDENT_AMBULATORY_CARE_PROVIDER_SITE_OTHER): Payer: Self-pay | Admitting: Pharmacist Clinician (PhC)/ Clinical Pharmacy Specialist

## 2012-08-13 DIAGNOSIS — Z954 Presence of other heart-valve replacement: Secondary | ICD-10-CM

## 2012-08-13 DIAGNOSIS — Z7901 Long term (current) use of anticoagulants: Secondary | ICD-10-CM

## 2012-08-20 ENCOUNTER — Telehealth (HOSPITAL_COMMUNITY): Payer: Self-pay | Admitting: Cardiovascular Disease

## 2012-08-20 ENCOUNTER — Other Ambulatory Visit (HOSPITAL_COMMUNITY): Payer: Self-pay | Admitting: Cardiovascular Disease

## 2012-08-20 DIAGNOSIS — I739 Peripheral vascular disease, unspecified: Secondary | ICD-10-CM

## 2012-08-20 NOTE — Telephone Encounter (Addendum)
LEFT MESSAGE FOR PATIENT TO CALL AND SCHEDULE APPT. Spoke with patient she will call back after her surgerty in 3 weeks

## 2012-08-25 ENCOUNTER — Encounter: Payer: Self-pay | Admitting: *Deleted

## 2012-08-26 ENCOUNTER — Encounter: Payer: Self-pay | Admitting: Cardiovascular Disease

## 2012-08-28 ENCOUNTER — Encounter: Payer: Self-pay | Admitting: Cardiovascular Disease

## 2012-08-28 ENCOUNTER — Ambulatory Visit (INDEPENDENT_AMBULATORY_CARE_PROVIDER_SITE_OTHER): Payer: BC Managed Care – PPO | Admitting: Cardiovascular Disease

## 2012-08-28 ENCOUNTER — Encounter: Payer: Self-pay | Admitting: Pharmacist Clinician (PhC)/ Clinical Pharmacy Specialist

## 2012-08-28 VITALS — BP 124/78 | HR 74 | Ht 64.5 in | Wt 244.8 lb

## 2012-08-28 DIAGNOSIS — I10 Essential (primary) hypertension: Secondary | ICD-10-CM | POA: Insufficient documentation

## 2012-08-28 DIAGNOSIS — I359 Nonrheumatic aortic valve disorder, unspecified: Secondary | ICD-10-CM

## 2012-08-28 DIAGNOSIS — Z79899 Other long term (current) drug therapy: Secondary | ICD-10-CM

## 2012-08-28 DIAGNOSIS — I350 Nonrheumatic aortic (valve) stenosis: Secondary | ICD-10-CM

## 2012-08-28 DIAGNOSIS — E785 Hyperlipidemia, unspecified: Secondary | ICD-10-CM

## 2012-08-28 DIAGNOSIS — I701 Atherosclerosis of renal artery: Secondary | ICD-10-CM

## 2012-08-28 NOTE — Assessment & Plan Note (Signed)
On statin therapy. We will recheck a lipid and liver profile 

## 2012-08-28 NOTE — Assessment & Plan Note (Signed)
History of St. Jude aVR performed by Dr. [name] September of 2006 because of severe aortic insufficiency. She had normal coronary arteries at that time. She is on Coumadin anticoagulation. Her last 2-D echo performed 11/08/11 revealed normal LV size and function with mild to moderate concentric left ventricular hypertrophy and a well-functioning aortic mechanical prosthesis. She had a CarboMedics #23 valve. We will recheck a 2-D echo on her this coming September.

## 2012-08-28 NOTE — Progress Notes (Signed)
08/28/2012 Anson Fret   05-02-1950  [id number]  Primary Physician Jearld Lesch, DO Primary Cardiologist: [name] MD [name]:  The patient is a 62 year old, moderately overweight, almost divorced Caucasian female mother of 47, grandmother to 2 grandchildren who has had a St. Jude AVR performed by Dr. Ceasar Mons September of 2006 because of severe aortic insufficiency. She had normal coronary arteries by cath at that time. A look at her renal arteries and demonstrated a 60% right renal artery stenosis and patent accessory left renal arteries. By duplex she does have a non-functioning left kidney. Her other problems include hypertension and hyperlipidemia. She also has a history of lupus followed by Dr. Jalene Mullet. An echo performed a year ago showed a well-functioning aortic prosthesis, and recent renal Dopplers revealed a widely patent right renal artery. She had a small bowel obstruction and underwent surgery at [facility] for perforated small bowel. She had an exploratory laparotomy with ileostomy and a right hemicolectomy 04/12/12. She saw [name] PAC back in our office 06/12/12 and her medicines were adjusted. She was somewhat hypotensive. She scheduled for colostomy take down later this month. She'll need Lovenox bridging.      Current Outpatient Prescriptions  Medication Sig Dispense Refill  . Calcium Carbonate (CALTRATE 600) 1500 MG TABS Take 1 tablet by mouth 2 (two) times daily.        . cholecalciferol (VITAMIN D) 1000 UNITS tablet Take 1,000 Units by mouth daily.        . Fluticasone-Salmeterol (ADVAIR DISKUS) 250-50 MCG/DOSE AEPB Inhale 1 puff into the lungs 2 (two) times daily.  180 each  1  . furosemide (LASIX) 80 MG tablet Take 80 mg by mouth daily.        . hydroxychloroquine (PLAQUENIL) 200 MG tablet Take 1 tablet (200 mg total) by mouth 2 (two) times daily.  60 tablet  11  . loratadine (CLARITIN) 10 MG tablet Take 10 mg by mouth daily.        . Multiple Vitamins-Minerals (CENTRUM SILVER PO) Take 1 tablet by mouth daily.        [name] oxymetazoline (AFRIN) 0.05 % nasal spray Place 2 sprays into the nose 2 (two) times daily.      . predniSONE (DELTASONE) 5 MG tablet Take 5 mg by mouth daily.        . simvastatin (ZOCOR) 20 MG tablet Take 20 mg by mouth at bedtime.        [name] warfarin (COUMADIN) 5 MG tablet Take 7.5 mg by mouth daily.       No current facility-administered medications for this visit.    No Known Allergies  History   Social History  . Marital Status: Divorced    Spouse Name: N/A    Number of Children: 3  . Years of Education: N/A   Occupational History  . Not on file.   Social History Main Topics  . Smoking status: Never Smoker   . Smokeless tobacco: Never Used  . Alcohol Use: No  . Drug Use: No  . Sexually Active: Not on file   Other Topics Concern  . Not on file   Social History Narrative  . No narrative on file     Review of Systems: General: negative for chills, fever, night sweats or weight changes.  Cardiovascular: negative for chest pain, dyspnea on exertion, edema, orthopnea, palpitations, paroxysmal nocturnal dyspnea or shortness of breath Dermatological: negative for rash Respiratory: negative for cough or wheezing Urologic:  negative for hematuria Abdominal: negative for nausea, vomiting, diarrhea, bright red blood per rectum, melena, or hematemesis Neurologic: negative for visual changes, syncope, or dizziness All other systems reviewed and are otherwise negative except as noted above.    Blood pressure 124/78, pulse 74, height 5' 4.5" (1.638 m), weight 244 lb 12.8 oz (111.041 kg).  General appearance: alert and no distress Neck: no adenopathy, no carotid bruit, no JVD, supple, symmetrical, trachea midline and thyroid not enlarged, symmetric, no tenderness/mass/nodules Lungs: clear to auscultation bilaterally Heart: crisp mechanical prosthetic heart sounds Extremities: extremities  normal, atraumatic, no cyanosis or edema  EKG not performed today  ASSESSMENT AND PLAN:   AORTIC VALVE REPLACEMENT, HX OF History of St. Jude aVR performed by Dr. [name] September of 2006 because of severe aortic insufficiency. She had normal coronary arteries at that time. She is on Coumadin anticoagulation. Her last 2-D echo performed 11/08/11 revealed normal LV size and function with mild to moderate concentric left ventricular hypertrophy and a well-functioning aortic mechanical prosthesis. She had a CarboMedics #23 valve. We will recheck a 2-D echo on her this coming September.  Hyperlipidemia On statin therapy. We will recheck a lipid and liver profile      [name] MD [facility] 08/28/2012 12:17 PM

## 2012-08-28 NOTE — Patient Instructions (Addendum)
Your physician recommends that you schedule a follow-up appointment in: 1 year  Your physician has requested that you have an echocardiogram. Echocardiography is a painless test that uses sound waves to create images of your heart. It provides your doctor with information about the size and shape of your heart and how well your heart's chambers and valves are working. This procedure takes approximately one hour. There are no restrictions for this procedure. In September 2014  Your physician has requested that you have a renal artery duplex. During this test, an ultrasound is used to evaluate blood flow to the kidneys. Allow one hour for this exam. Do not eat after midnight the day before and avoid carbonated beverages. Take your medications as you usually do. In September 2014

## 2012-09-02 ENCOUNTER — Telehealth: Payer: Self-pay | Admitting: Cardiovascular Disease

## 2012-09-02 DIAGNOSIS — E782 Mixed hyperlipidemia: Secondary | ICD-10-CM

## 2012-09-02 DIAGNOSIS — Z79899 Other long term (current) drug therapy: Secondary | ICD-10-CM

## 2012-09-02 LAB — COMPREHENSIVE METABOLIC PANEL
ALT: 15 IU/L (ref 0–32)
Albumin/Globulin Ratio: 1.1 (ref 1.1–2.5)
Albumin: 3.7 g/dL (ref 3.5–5.5)
BUN/Creatinine Ratio: 22 (ref 9–23)
BUN: 31 mg/dL — ABNORMAL HIGH (ref 6–24)
CO2: 23 mmol/L (ref 18–29)
Calcium: 9 mg/dL (ref 8.7–10.2)
Chloride: 98 mmol/L (ref 97–108)
Creatinine, Ser: 1.39 mg/dL — ABNORMAL HIGH (ref 0.57–1.00)
Sodium: 133 mmol/L — ABNORMAL LOW (ref 134–144)
Total Bilirubin: 0.4 mg/dL (ref 0.0–1.2)
Total Protein: 7 g/dL (ref 6.0–8.5)

## 2012-09-02 LAB — LIPID PANEL
Chol/HDL Ratio: 2.1 ratio units (ref 0.0–4.4)
LDL Calculated: 79 mg/dL (ref 0–99)
VLDL Cholesterol Cal: 17 mg/dL (ref 5–40)

## 2012-09-02 LAB — PLEASE NOTE

## 2012-09-02 NOTE — Telephone Encounter (Signed)
Please call-question about sample received on Friday(08-29-12)!

## 2012-09-02 NOTE — Telephone Encounter (Signed)
Returned call to [facility].  Stated the received a sample on Friday that they think might belong to [place].  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  Need new order to be faxed to [phone] Attn: Vinnie Level.    Labs reordered and faxed.

## 2012-09-22 ENCOUNTER — Telehealth: Payer: Self-pay | Admitting: Cardiovascular Disease

## 2012-09-22 ENCOUNTER — Ambulatory Visit (INDEPENDENT_AMBULATORY_CARE_PROVIDER_SITE_OTHER): Payer: Self-pay | Admitting: Pharmacist

## 2012-09-22 DIAGNOSIS — Z954 Presence of other heart-valve replacement: Secondary | ICD-10-CM

## 2012-09-22 DIAGNOSIS — Z7901 Long term (current) use of anticoagulants: Secondary | ICD-10-CM

## 2012-09-22 LAB — POCT INR: INR: 1.4

## 2012-09-22 MED ORDER — ENOXAPARIN SODIUM 120 MG/0.8ML ~~LOC~~ SOLN: 120.0000 mg | Freq: Two times a day (BID) | SUBCUTANEOUS | Status: DC

## 2012-09-22 NOTE — Telephone Encounter (Signed)
Joellen Jersey called stating that no one got back to her about this pt's results---please call her back at [phone]

## 2012-09-22 NOTE — Telephone Encounter (Signed)
PT/INR results are: INR 1.4; PT 16.9

## 2012-09-22 NOTE — Telephone Encounter (Signed)
Spoke with [name] with [facility].  Gave orders.  See Anti-Coag note for details.

## 2012-09-25 ENCOUNTER — Ambulatory Visit (INDEPENDENT_AMBULATORY_CARE_PROVIDER_SITE_OTHER): Payer: Self-pay | Admitting: Pharmacist

## 2012-09-25 ENCOUNTER — Telehealth: Payer: Self-pay | Admitting: Cardiovascular Disease

## 2012-09-25 DIAGNOSIS — Z954 Presence of other heart-valve replacement: Secondary | ICD-10-CM

## 2012-09-25 DIAGNOSIS — Z7901 Long term (current) use of anticoagulants: Secondary | ICD-10-CM

## 2012-09-25 NOTE — Telephone Encounter (Signed)
INR is 2.7 and PT is 32.7

## 2012-09-25 NOTE — Telephone Encounter (Signed)
Spoke with [name] and pt.  INR dosed.  See anti-coag visit for details.

## 2012-09-25 NOTE — Telephone Encounter (Signed)
[name] was calling in results for INR and PT  INR 2.7 PT 32.7  Can you please call [name] back she has call 2 times and was wondering why she has not received a call back.

## 2012-09-25 NOTE — Telephone Encounter (Signed)
Forwarded to Pharmacist.

## 2012-10-02 ENCOUNTER — Telehealth: Payer: Self-pay | Admitting: Cardiovascular Disease

## 2012-10-02 ENCOUNTER — Ambulatory Visit (INDEPENDENT_AMBULATORY_CARE_PROVIDER_SITE_OTHER): Payer: Self-pay | Admitting: Pharmacist Clinician (PhC)/ Clinical Pharmacy Specialist

## 2012-10-02 DIAGNOSIS — Z954 Presence of other heart-valve replacement: Secondary | ICD-10-CM

## 2012-10-02 DIAGNOSIS — Z7901 Long term (current) use of anticoagulants: Secondary | ICD-10-CM

## 2012-10-02 LAB — POCT INR: INR: 3.6

## 2012-10-02 NOTE — Telephone Encounter (Signed)
[facility] was calling to let you know:  INR: 3.6 PT: 43.8

## 2012-10-08 ENCOUNTER — Ambulatory Visit: Payer: BC Managed Care – PPO | Admitting: Cardiovascular Disease

## 2012-10-09 ENCOUNTER — Telehealth: Payer: Self-pay | Admitting: Pharmacist Clinician (PhC)/ Clinical Pharmacy Specialist

## 2012-10-09 ENCOUNTER — Ambulatory Visit (INDEPENDENT_AMBULATORY_CARE_PROVIDER_SITE_OTHER): Payer: Self-pay | Admitting: Pharmacist Clinician (PhC)/ Clinical Pharmacy Specialist

## 2012-10-09 DIAGNOSIS — Z954 Presence of other heart-valve replacement: Secondary | ICD-10-CM

## 2012-10-09 DIAGNOSIS — Z7901 Long term (current) use of anticoagulants: Secondary | ICD-10-CM

## 2012-10-09 NOTE — Telephone Encounter (Signed)
INR is 2.1 PT is 25.7

## 2012-10-16 ENCOUNTER — Ambulatory Visit (INDEPENDENT_AMBULATORY_CARE_PROVIDER_SITE_OTHER): Payer: Medicare Other | Admitting: Pharmacist Clinician (PhC)/ Clinical Pharmacy Specialist

## 2012-10-16 DIAGNOSIS — Z954 Presence of other heart-valve replacement: Secondary | ICD-10-CM

## 2012-10-16 DIAGNOSIS — Z7901 Long term (current) use of anticoagulants: Secondary | ICD-10-CM

## 2012-10-16 LAB — POCT INR: INR: 2.9

## 2012-10-23 ENCOUNTER — Ambulatory Visit (INDEPENDENT_AMBULATORY_CARE_PROVIDER_SITE_OTHER): Payer: Medicare Other | Admitting: Pharmacist Clinician (PhC)/ Clinical Pharmacy Specialist

## 2012-10-23 ENCOUNTER — Telehealth: Payer: Self-pay | Admitting: Cardiovascular Disease

## 2012-10-23 DIAGNOSIS — Z7901 Long term (current) use of anticoagulants: Secondary | ICD-10-CM

## 2012-10-23 DIAGNOSIS — Z954 Presence of other heart-valve replacement: Secondary | ICD-10-CM

## 2012-10-23 LAB — POCT INR: INR: 2.9

## 2012-10-23 NOTE — Telephone Encounter (Signed)
Inr   2.9 and pt is 34.9 . She was discharged today from home health on today and would need to go to a lab or somewhere to get her pt/inr done .[name]  Thanks

## 2012-10-29 ENCOUNTER — Encounter (HOSPITAL_COMMUNITY): Payer: BC Managed Care – PPO

## 2012-11-04 ENCOUNTER — Ambulatory Visit (HOSPITAL_COMMUNITY)
Admission: RE | Admit: 2012-11-04 | Discharge: 2012-11-04 | Disposition: A | Discharge status: Home / Self Care | Payer: Medicare Other | Source: Ambulatory Visit | Attending: Cardiovascular Disease | Admitting: Cardiovascular Disease

## 2012-11-04 DIAGNOSIS — I359 Nonrheumatic aortic valve disorder, unspecified: Secondary | ICD-10-CM

## 2012-11-04 DIAGNOSIS — Z09 Encounter for follow-up examination after completed treatment for conditions other than malignant neoplasm: Secondary | ICD-10-CM | POA: Insufficient documentation

## 2012-11-04 DIAGNOSIS — Z954 Presence of other heart-valve replacement: Secondary | ICD-10-CM | POA: Insufficient documentation

## 2012-11-04 DIAGNOSIS — I350 Nonrheumatic aortic (valve) stenosis: Secondary | ICD-10-CM

## 2012-11-04 NOTE — Progress Notes (Signed)
2D Echo Performed 11/04/2012    Marygrace Drought, RCS

## 2012-11-06 ENCOUNTER — Ambulatory Visit (INDEPENDENT_AMBULATORY_CARE_PROVIDER_SITE_OTHER): Payer: Medicare Other | Admitting: Internal Medicine

## 2012-11-06 ENCOUNTER — Encounter: Payer: Self-pay | Admitting: Internal Medicine

## 2012-11-06 VITALS — BP 120/68 | HR 77 | Ht 65.0 in | Wt 247.0 lb

## 2012-11-06 DIAGNOSIS — J309 Allergic rhinitis, unspecified: Secondary | ICD-10-CM

## 2012-11-06 DIAGNOSIS — J4520 Mild intermittent asthma, uncomplicated: Secondary | ICD-10-CM

## 2012-11-06 DIAGNOSIS — J302 Other seasonal allergic rhinitis: Secondary | ICD-10-CM

## 2012-11-06 DIAGNOSIS — Z23 Encounter for immunization: Secondary | ICD-10-CM

## 2012-11-06 DIAGNOSIS — J45909 Unspecified asthma, uncomplicated: Secondary | ICD-10-CM

## 2012-11-06 NOTE — Progress Notes (Signed)
Subjective:    Patient ID: [name], female    DOB: 25-Jun-1950, 62 y.o.   MRN: [id number]  HPI 10/03/10-62 year old female never smoker followed for asthma complicated by past history of lupus and aortic valve replacement. Last here April 22, 2009-she responded well with Xopenex nebulizer treatment and Depo-Medrol injection. She reports a good year with no special problems. No colds and no interventions. She keeps a little dry cough which she minimizes. We discussed ACEI Altace.  Continues Advair bid and she never filled Proair rescue script last year.  She had 2011 flu shot and has had pneumonia vaccine in the past but not sure when.  11/06/11- 62 year old female never smoker followed for asthma complicated by past history of lupus and aortic valve replacement. Cough at times-went to PCP/ [facility]-? altace or procardia causing this.  Has been doing very well using Advair just once daily with no season change. Describes a couple of months of dry cough. On prednisone 5 mg daily for lupus. Continues Flonase. Ears frequently stop up.  11/06/12- 62 year old female never smoker followed for asthma complicated by past history of lupus and aortic valve replacement. FOLLOWS FOR: denies any cough, congestion,wheezing, or SOB at this time. Flu shot today. Had perforated colon, temporary colostomy, reversed. Given nebulizer treatments in hospital. Does not remember any respiratory complication. Prednisone 5 mg daily for lupus.  Review of Systems- see HPI Constitutional:   No-   weight loss, night sweats, fevers, chills, fatigue, lassitude. HEENT:   No-  headaches, difficulty swallowing, tooth/dental problems, sore throat,       No-  sneezing, itching, +ear pressure, + controlled nasal congestion, post nasal drip,  CV:  No-   chest pain, orthopnea, PND, swelling in lower extremities, anasarca,dizziness, palpitations Resp: No-   shortness of breath with exertion or at rest.              No-    productive cough, no- non-productive cough,  No-  coughing up of blood.              No-   change in color of mucus.  No- wheezing.   Skin: No-   rash or lesions. GI:  No-   heartburn, indigestion, abdominal pain, nausea, vomiting,  GU:  MS:  No-   joint pain or swelling.   Neuro- nothing unusual  Psych:  No- change in mood or affect. No depression or anxiety.  No memory loss.  Objective:   Physical Exam General- Alert, Oriented, Affect-appropriate, Distress- none acute;  obese Skin- rash-none, lesions- none, excoriation- none Lymphadenopathy- none Head- atraumatic            Eyes- Gross vision intact, PERRLA, conjunctivae clear secretions            Ears- Hearing, canals normal            Nose- Clear, no-Septal dev, mucus, polyps, erosion, perforation             Throat- Mallampati III , mucosa clear , drainage- none, tonsils- atrophic Neck- flexible , trachea midline, no stridor , thyroid nl, carotid no bruit Chest - symmetrical excursion , unlabored           Heart/CV- RRR , +crisp valve sounds,  1/6 Systolic AS  Murmur, no gallop  , no rub, nl s1 s2                           - JVD- none ,  edema- none, stasis changes- none, varices- none           Lung- clear to P&A, wheeze- none, cough- none , dullness-none, rub- none           Chest wall-  Abd-  Br/ Gen/ Rectal- Not done, not indicated Extrem- cyanosis- none, clubbing, none, atrophy- none, strength- nl Neuro- grossly intact to observation  Assessment & Plan:

## 2012-11-06 NOTE — Patient Instructions (Addendum)
Flu vax  Please call as needed  

## 2012-11-10 ENCOUNTER — Encounter: Payer: Self-pay | Admitting: *Deleted

## 2012-11-15 NOTE — Assessment & Plan Note (Signed)
No changes needed 

## 2012-11-15 NOTE — Assessment & Plan Note (Signed)
In good control

## 2012-11-19 ENCOUNTER — Ambulatory Visit (HOSPITAL_COMMUNITY)
Admission: RE | Admit: 2012-11-19 | Discharge: 2012-11-19 | Disposition: A | Discharge status: Home / Self Care | Payer: Medicare Other | Source: Ambulatory Visit | Attending: Cardiology | Admitting: Cardiology

## 2012-11-19 DIAGNOSIS — I739 Peripheral vascular disease, unspecified: Secondary | ICD-10-CM | POA: Insufficient documentation

## 2012-11-19 DIAGNOSIS — I701 Atherosclerosis of renal artery: Secondary | ICD-10-CM

## 2012-11-19 NOTE — Progress Notes (Signed)
Renal Duplex Completed. Lyndsie Wallman, BS, RDMS, RVT  

## 2012-11-27 ENCOUNTER — Encounter: Payer: Self-pay | Admitting: *Deleted

## 2012-11-27 ENCOUNTER — Ambulatory Visit (INDEPENDENT_AMBULATORY_CARE_PROVIDER_SITE_OTHER): Payer: Medicare Other | Admitting: Pharmacist Clinician (PhC)/ Clinical Pharmacy Specialist

## 2012-11-27 DIAGNOSIS — Z7901 Long term (current) use of anticoagulants: Secondary | ICD-10-CM

## 2012-11-27 DIAGNOSIS — Z954 Presence of other heart-valve replacement: Secondary | ICD-10-CM

## 2012-11-27 NOTE — Progress Notes (Signed)
Quick Note:  Note sent to patient ______

## 2012-12-01 ENCOUNTER — Telehealth: Payer: Self-pay | Admitting: *Deleted

## 2012-12-01 DIAGNOSIS — I701 Atherosclerosis of renal artery: Secondary | ICD-10-CM

## 2012-12-01 NOTE — Telephone Encounter (Signed)
Message copied by Chauncy Lean on Mon Dec 01, 2012  3:04 PM ------      Message from: [name]      Created: Mon Dec 01, 2012  1:41 PM       No change from prior study. Repeat in 6 months ------

## 2012-12-01 NOTE — Telephone Encounter (Signed)
Order placed for repeat renal doppler in 6 months

## 2012-12-19 ENCOUNTER — Other Ambulatory Visit: Payer: Self-pay | Admitting: *Deleted

## 2012-12-19 MED ORDER — RAMIPRIL 5 MG PO CAPS: 5.0000 mg | ORAL_CAPSULE | Freq: Every day | ORAL | Status: DC

## 2012-12-30 ENCOUNTER — Other Ambulatory Visit: Payer: Self-pay

## 2012-12-30 MED ORDER — SIMVASTATIN 20 MG PO TABS: 20.0000 mg | ORAL_TABLET | Freq: Every day | ORAL | Status: DC

## 2012-12-30 NOTE — Telephone Encounter (Signed)
Rx for Simvastatin was sent to the pharmacy electronically.  Rx for Warfarin, routed to Tommy Medal, RPh for approval.

## 2012-12-31 MED ORDER — WARFARIN SODIUM 5 MG PO TABS: 7.5000 mg | ORAL_TABLET | Freq: Every day | ORAL | Status: DC

## 2013-01-01 ENCOUNTER — Telehealth: Payer: Self-pay | Admitting: Cardiovascular Disease

## 2013-01-01 DIAGNOSIS — Z7901 Long term (current) use of anticoagulants: Secondary | ICD-10-CM

## 2013-01-01 LAB — PROTIME-INR: INR: 2.6 — AB (ref <=1.1)

## 2013-01-01 NOTE — Telephone Encounter (Signed)
Per Jeani Hawking, need standing order for PT/INR.  Returned call and line busy x 2.  Order placed in [facility] for [facility].

## 2013-01-01 NOTE — Telephone Encounter (Signed)
Order faxed to [phone].

## 2013-01-02 ENCOUNTER — Ambulatory Visit (INDEPENDENT_AMBULATORY_CARE_PROVIDER_SITE_OTHER): Payer: Medicare Other | Admitting: Pharmacist Clinician (PhC)/ Clinical Pharmacy Specialist

## 2013-01-02 DIAGNOSIS — Z954 Presence of other heart-valve replacement: Secondary | ICD-10-CM

## 2013-01-02 DIAGNOSIS — Z7901 Long term (current) use of anticoagulants: Secondary | ICD-10-CM

## 2013-02-09 ENCOUNTER — Telehealth: Payer: Self-pay | Admitting: *Deleted

## 2013-02-09 LAB — PROTIME-INR: INR: 1.9 — AB (ref <=1.1)

## 2013-02-09 NOTE — Telephone Encounter (Signed)
Requested lab order for INR. Faxed order to [phone].

## 2013-02-10 ENCOUNTER — Ambulatory Visit (INDEPENDENT_AMBULATORY_CARE_PROVIDER_SITE_OTHER): Payer: Medicare Other | Admitting: Pharmacist Clinician (PhC)/ Clinical Pharmacy Specialist

## 2013-02-10 DIAGNOSIS — Z7901 Long term (current) use of anticoagulants: Secondary | ICD-10-CM

## 2013-02-10 DIAGNOSIS — Z954 Presence of other heart-valve replacement: Secondary | ICD-10-CM

## 2013-02-17 ENCOUNTER — Other Ambulatory Visit: Payer: Self-pay | Admitting: *Deleted

## 2013-02-17 MED ORDER — FLUTICASONE-SALMETEROL 250-50 MCG/DOSE IN AEPB: 1.0000 | INHALATION_SPRAY | Freq: Two times a day (BID) | RESPIRATORY_TRACT | Status: DC

## 2013-02-19 DIAGNOSIS — I442 Atrioventricular block, complete: Secondary | ICD-10-CM

## 2013-02-19 HISTORY — DX: Atrioventricular block, complete: I44.2

## 2013-02-24 ENCOUNTER — Other Ambulatory Visit: Payer: Self-pay | Admitting: Pharmacist Clinician (PhC)/ Clinical Pharmacy Specialist

## 2013-02-25 ENCOUNTER — Other Ambulatory Visit: Payer: Self-pay | Admitting: Cardiovascular Disease

## 2013-02-25 ENCOUNTER — Ambulatory Visit (INDEPENDENT_AMBULATORY_CARE_PROVIDER_SITE_OTHER): Payer: Medicare Other | Admitting: Pharmacist Clinician (PhC)/ Clinical Pharmacy Specialist

## 2013-02-25 DIAGNOSIS — Z7901 Long term (current) use of anticoagulants: Secondary | ICD-10-CM

## 2013-02-25 DIAGNOSIS — Z954 Presence of other heart-valve replacement: Secondary | ICD-10-CM

## 2013-02-25 LAB — PROTIME-INR
INR: 2.2 — ABNORMAL HIGH (ref 0.8–1.2)
Prothrombin Time: 22.5 s — ABNORMAL HIGH (ref 9.1–12.0)

## 2013-02-25 NOTE — Telephone Encounter (Signed)
Need new prescriptions because her pharmacy have changed because of insurance. She need Her Simavastiin,Ramapril and Warfarin.Please send to [facility] [phone].

## 2013-02-25 NOTE — Telephone Encounter (Deleted)
Rx(s) for Simvastatin and Ramipril sent to pharmacy electronically.  Rx for Warfarin routed to K. Alvstad, PharmD, for approval.

## 2013-02-26 MED ORDER — WARFARIN SODIUM 5 MG PO TABS: ORAL_TABLET | ORAL | Status: DC

## 2013-02-26 MED ORDER — RAMIPRIL 5 MG PO CAPS: 5.0000 mg | ORAL_CAPSULE | Freq: Every day | ORAL | Status: DC

## 2013-02-26 MED ORDER — SIMVASTATIN 20 MG PO TABS: 20.0000 mg | ORAL_TABLET | Freq: Every day | ORAL | Status: DC

## 2013-02-26 NOTE — Telephone Encounter (Signed)
Refills initiated and forwarded to Tommy Medal, PharmD to review for refill on warfarin.

## 2013-03-06 ENCOUNTER — Emergency Department (HOSPITAL_COMMUNITY): Payer: Medicare Other

## 2013-03-06 ENCOUNTER — Inpatient Hospital Stay (HOSPITAL_COMMUNITY)
Admission: EM | Admit: 2013-03-06 | Discharge: 2013-03-07 | DRG: 242 | Disposition: A | Discharge status: Home / Self Care | Payer: Medicare Other | Attending: Internal Medicine | Admitting: Internal Medicine

## 2013-03-06 ENCOUNTER — Encounter (HOSPITAL_COMMUNITY)
Admission: EM | Disposition: A | Discharge status: Home / Self Care | Payer: Medicare Other | Source: Home / Self Care | Attending: Internal Medicine

## 2013-03-06 ENCOUNTER — Encounter (HOSPITAL_COMMUNITY): Payer: Self-pay | Admitting: Cardiology

## 2013-03-06 DIAGNOSIS — I701 Atherosclerosis of renal artery: Secondary | ICD-10-CM | POA: Diagnosis present

## 2013-03-06 DIAGNOSIS — Z5181 Encounter for therapeutic drug level monitoring: Secondary | ICD-10-CM

## 2013-03-06 DIAGNOSIS — J45909 Unspecified asthma, uncomplicated: Secondary | ICD-10-CM | POA: Diagnosis present

## 2013-03-06 DIAGNOSIS — Z952 Presence of prosthetic heart valve: Secondary | ICD-10-CM

## 2013-03-06 DIAGNOSIS — I498 Other specified cardiac arrhythmias: Secondary | ICD-10-CM

## 2013-03-06 DIAGNOSIS — Z954 Presence of other heart-valve replacement: Secondary | ICD-10-CM

## 2013-03-06 DIAGNOSIS — I469 Cardiac arrest, cause unspecified: Secondary | ICD-10-CM | POA: Diagnosis present

## 2013-03-06 DIAGNOSIS — J3089 Other allergic rhinitis: Secondary | ICD-10-CM

## 2013-03-06 DIAGNOSIS — R001 Bradycardia, unspecified: Secondary | ICD-10-CM | POA: Diagnosis present

## 2013-03-06 DIAGNOSIS — E785 Hyperlipidemia, unspecified: Secondary | ICD-10-CM

## 2013-03-06 DIAGNOSIS — I442 Atrioventricular block, complete: Secondary | ICD-10-CM

## 2013-03-06 DIAGNOSIS — J302 Other seasonal allergic rhinitis: Secondary | ICD-10-CM

## 2013-03-06 DIAGNOSIS — I10 Essential (primary) hypertension: Secondary | ICD-10-CM

## 2013-03-06 DIAGNOSIS — I739 Peripheral vascular disease, unspecified: Secondary | ICD-10-CM | POA: Diagnosis present

## 2013-03-06 DIAGNOSIS — Z7901 Long term (current) use of anticoagulants: Secondary | ICD-10-CM

## 2013-03-06 DIAGNOSIS — M329 Systemic lupus erythematosus, unspecified: Secondary | ICD-10-CM

## 2013-03-06 DIAGNOSIS — J4520 Mild intermittent asthma, uncomplicated: Secondary | ICD-10-CM

## 2013-03-06 HISTORY — PX: PERMANENT PACEMAKER INSERTION: SHX5480

## 2013-03-06 HISTORY — PX: PACEMAKER INSERTION: SHX728

## 2013-03-06 HISTORY — DX: Atrioventricular block, complete: I44.2

## 2013-03-06 HISTORY — PX: TEMPORARY PACEMAKER INSERTION: SHX5471

## 2013-03-06 LAB — CBC WITH DIFFERENTIAL/PLATELET
Basophils Absolute: 0 10*3/uL (ref 0.0–0.1)
Basophils Relative: 1 % (ref 0–1)
EOS ABS: 0.2 10*3/uL (ref 0.0–0.7)
EOS PCT: 2 % (ref 0–5)
HEMATOCRIT: 37.1 % (ref 36.0–46.0)
HEMOGLOBIN: 12 g/dL (ref 12.0–15.0)
LYMPHS ABS: 1.7 10*3/uL (ref 0.7–4.0)
Lymphocytes Relative: 20 % (ref 12–46)
MCH: 27.5 pg (ref 26.0–34.0)
MCHC: 32.3 g/dL (ref 30.0–36.0)
MCV: 84.9 fL (ref 78.0–100.0)
MONOS PCT: 11 % (ref 3–12)
Monocytes Absolute: 0.9 10*3/uL (ref 0.1–1.0)
Neutro Abs: 5.6 10*3/uL (ref 1.7–7.7)
Neutrophils Relative %: 67 % (ref 43–77)
Platelets: 249 10*3/uL (ref 150–400)
RBC: 4.37 MIL/uL (ref 3.87–5.11)
RDW: 15.6 % — ABNORMAL HIGH (ref 11.5–15.5)
WBC: 8.4 10*3/uL (ref 4.0–10.5)

## 2013-03-06 LAB — POCT I-STAT TROPONIN I: TROPONIN I, POC: 0.01 ng/mL (ref 0.00–0.08)

## 2013-03-06 LAB — POCT I-STAT, CHEM 8
BUN: 31 mg/dL — ABNORMAL HIGH (ref 6–23)
Calcium, Ion: 1.15 mmol/L (ref 1.13–1.30)
Chloride: 106 mEq/L (ref 96–112)
Creatinine, Ser: 1.6 mg/dL — ABNORMAL HIGH (ref 0.50–1.10)
GLUCOSE: 136 mg/dL — AB (ref 70–99)
HCT: 38 % (ref 36.0–46.0)
Hemoglobin: 12.9 g/dL (ref 12.0–15.0)
POTASSIUM: 4.7 meq/L (ref 3.7–5.3)
Sodium: 140 mEq/L (ref 137–147)
TCO2: 23 mmol/L (ref 0–100)

## 2013-03-06 LAB — COMPREHENSIVE METABOLIC PANEL
ALT: 46 U/L — ABNORMAL HIGH (ref 0–35)
AST: 49 U/L — ABNORMAL HIGH (ref 0–37)
Albumin: 3.2 g/dL — ABNORMAL LOW (ref 3.5–5.2)
Alkaline Phosphatase: 110 U/L (ref 39–117)
BILIRUBIN TOTAL: 0.2 mg/dL — AB (ref 0.3–1.2)
BUN: 25 mg/dL — ABNORMAL HIGH (ref 6–23)
CALCIUM: 8.8 mg/dL (ref 8.4–10.5)
CO2: 22 meq/L (ref 19–32)
Chloride: 103 mEq/L (ref 96–112)
Creatinine, Ser: 1.42 mg/dL — ABNORMAL HIGH (ref 0.50–1.10)
GFR calc Af Amer: 45 mL/min — ABNORMAL LOW (ref >90)
GFR calc non Af Amer: 39 mL/min — ABNORMAL LOW (ref >90)
GLUCOSE: 131 mg/dL — AB (ref 70–99)
Potassium: 4.8 mEq/L (ref 3.7–5.3)
Sodium: 138 mEq/L (ref 137–147)
Total Protein: 7.1 g/dL (ref 6.0–8.3)

## 2013-03-06 LAB — MRSA PCR SCREENING: MRSA BY PCR: NEGATIVE

## 2013-03-06 LAB — MAGNESIUM: Magnesium: 2 mg/dL (ref 1.5–2.5)

## 2013-03-06 LAB — PROTIME-INR
INR: 2.1 — AB (ref 0.00–1.49)
PROTHROMBIN TIME: 22.9 s — AB (ref 11.6–15.2)

## 2013-03-06 LAB — APTT: aPTT: 32 seconds (ref 24–37)

## 2013-03-06 LAB — GLUCOSE, CAPILLARY: GLUCOSE-CAPILLARY: 114 mg/dL — AB (ref 70–99)

## 2013-03-06 LAB — TSH: TSH: 2.666 u[IU]/mL (ref 0.350–4.500)

## 2013-03-06 SURGERY — PERMANENT PACEMAKER INSERTION
Anesthesia: LOCAL

## 2013-03-06 MED ORDER — WARFARIN SODIUM 7.5 MG PO TABS
7.5000 mg | ORAL_TABLET | Freq: Once | ORAL | Status: AC
  Administered 2013-03-06: 7.5 mg via ORAL
  Filled 2013-03-06: qty 1

## 2013-03-06 MED ORDER — VITAMIN D3 25 MCG (1000 UNIT) PO TABS
1000.0000 [IU] | ORAL_TABLET | Freq: Every day | ORAL | Status: DC
  Administered 2013-03-06 – 2013-03-07 (×2): 1000 [IU] via ORAL
  Filled 2013-03-06 (×2): qty 1

## 2013-03-06 MED ORDER — SODIUM CHLORIDE 0.9 % IJ SOLN
3.0000 mL | Freq: Two times a day (BID) | INTRAMUSCULAR | Status: DC
  Administered 2013-03-07: 3 mL via INTRAVENOUS

## 2013-03-06 MED ORDER — ALUM & MAG HYDROXIDE-SIMETH 200-200-20 MG/5ML PO SUSP: 30.0000 mL | Freq: Four times a day (QID) | ORAL | Status: DC | PRN

## 2013-03-06 MED ORDER — ONDANSETRON HCL 4 MG PO TABS: 4.0000 mg | ORAL_TABLET | Freq: Four times a day (QID) | ORAL | Status: DC | PRN

## 2013-03-06 MED ORDER — SODIUM CHLORIDE 0.9 % IV SOLN: INTRAVENOUS | Status: DC

## 2013-03-06 MED ORDER — ACETAMINOPHEN 325 MG PO TABS: 650.0000 mg | ORAL_TABLET | Freq: Four times a day (QID) | ORAL | Status: DC | PRN

## 2013-03-06 MED ORDER — SIMVASTATIN 20 MG PO TABS
20.0000 mg | ORAL_TABLET | Freq: Every day | ORAL | Status: DC
  Administered 2013-03-06: 20 mg via ORAL
  Filled 2013-03-06 (×2): qty 1

## 2013-03-06 MED ORDER — HYDROCODONE-ACETAMINOPHEN 5-325 MG PO TABS
1.0000 | ORAL_TABLET | ORAL | Status: DC | PRN
Start: 2013-03-06 — End: 2013-03-07

## 2013-03-06 MED ORDER — HYDROXYCHLOROQUINE SULFATE 200 MG PO TABS
200.0000 mg | ORAL_TABLET | Freq: Two times a day (BID) | ORAL | Status: DC
  Administered 2013-03-06 – 2013-03-07 (×2): 200 mg via ORAL
  Filled 2013-03-06 (×3): qty 1

## 2013-03-06 MED ORDER — MOMETASONE FURO-FORMOTEROL FUM 100-5 MCG/ACT IN AERO
2.0000 | INHALATION_SPRAY | Freq: Two times a day (BID) | RESPIRATORY_TRACT | Status: DC
  Administered 2013-03-06 – 2013-03-07 (×2): 2 via RESPIRATORY_TRACT
  Filled 2013-03-06: qty 8.8

## 2013-03-06 MED ORDER — ATROPINE SULFATE 1 MG/ML IJ SOLN
INTRAMUSCULAR | Status: DC | PRN
  Administered 2013-03-06: .5 mg via INTRAVENOUS

## 2013-03-06 MED ORDER — ONDANSETRON HCL 4 MG/2ML IJ SOLN: 4.0000 mg | Freq: Four times a day (QID) | INTRAMUSCULAR | Status: DC | PRN

## 2013-03-06 MED ORDER — ASPIRIN EC 81 MG PO TBEC
81.0000 mg | DELAYED_RELEASE_TABLET | Freq: Every day | ORAL | Status: DC
  Administered 2013-03-06 – 2013-03-07 (×2): 81 mg via ORAL
  Filled 2013-03-06 (×2): qty 1

## 2013-03-06 MED ORDER — SODIUM CHLORIDE 0.9 % IR SOLN
Status: DC
  Filled 2013-03-06: qty 2

## 2013-03-06 MED ORDER — MIDAZOLAM HCL 2 MG/2ML IJ SOLN
2.0000 mg | Freq: Once | INTRAMUSCULAR | Status: AC
  Administered 2013-03-06: 2 mg via INTRAVENOUS

## 2013-03-06 MED ORDER — CEFAZOLIN SODIUM 1-5 GM-% IV SOLN
1.0000 g | Freq: Four times a day (QID) | INTRAVENOUS | Status: AC
  Administered 2013-03-06 – 2013-03-07 (×3): 1 g via INTRAVENOUS
  Filled 2013-03-06 (×3): qty 50

## 2013-03-06 MED ORDER — DOPAMINE-DEXTROSE 3.2-5 MG/ML-% IV SOLN
INTRAVENOUS | Status: AC
  Filled 2013-03-06: qty 250

## 2013-03-06 MED ORDER — SODIUM BICARBONATE 8.4 % IV SOLN
INTRAVENOUS | Status: AC | PRN
  Administered 2013-03-06: 50 meq via INTRAVENOUS

## 2013-03-06 MED ORDER — PREDNISONE 5 MG PO TABS
5.0000 mg | ORAL_TABLET | Freq: Every day | ORAL | Status: DC
  Administered 2013-03-06 – 2013-03-07 (×2): 5 mg via ORAL
  Filled 2013-03-06 (×2): qty 1

## 2013-03-06 MED ORDER — WARFARIN - PHARMACIST DOSING INPATIENT: Freq: Every day | Status: DC

## 2013-03-06 MED ORDER — SODIUM CHLORIDE 0.9 % IJ SOLN: 3.0000 mL | INTRAMUSCULAR | Status: DC | PRN

## 2013-03-06 MED ORDER — CALCIUM CARBONATE 1500 (600 CA) MG PO TABS: 1.0000 | ORAL_TABLET | Freq: Two times a day (BID) | ORAL | Status: DC

## 2013-03-06 MED ORDER — CALCIUM CARBONATE 1250 (500 CA) MG PO TABS
1.0000 | ORAL_TABLET | Freq: Two times a day (BID) | ORAL | Status: DC
  Administered 2013-03-06 – 2013-03-07 (×2): 500 mg via ORAL
  Filled 2013-03-06 (×4): qty 1

## 2013-03-06 MED ORDER — ONDANSETRON HCL 4 MG/2ML IJ SOLN
INTRAMUSCULAR | Status: AC
  Administered 2013-03-06: 4 mg
  Filled 2013-03-06: qty 2

## 2013-03-06 MED ORDER — ASPIRIN 81 MG PO CHEW
324.0000 mg | CHEWABLE_TABLET | Freq: Once | ORAL | Status: AC
  Administered 2013-03-06: 324 mg via ORAL
  Filled 2013-03-06: qty 4

## 2013-03-06 MED ORDER — SODIUM CHLORIDE 0.9 % IV SOLN: 250.0000 mL | INTRAVENOUS | Status: DC | PRN

## 2013-03-06 MED ORDER — ACETAMINOPHEN 650 MG RE SUPP: 650.0000 mg | Freq: Four times a day (QID) | RECTAL | Status: DC | PRN

## 2013-03-06 MED ORDER — ZOLPIDEM TARTRATE 5 MG PO TABS: 5.0000 mg | ORAL_TABLET | Freq: Every evening | ORAL | Status: DC | PRN

## 2013-03-06 MED ORDER — MIDAZOLAM HCL 2 MG/2ML IJ SOLN
INTRAMUSCULAR | Status: AC
  Filled 2013-03-06: qty 2

## 2013-03-06 MED ORDER — DEXTROSE 5 % IV SOLN
3.0000 g | INTRAVENOUS | Status: DC
  Filled 2013-03-06: qty 3000

## 2013-03-06 MED ORDER — CALCIUM CHLORIDE 10 % IV SOLN
INTRAVENOUS | Status: AC | PRN
  Administered 2013-03-06: 1000 mg via INTRAVENOUS

## 2013-03-06 MED ORDER — ACETAMINOPHEN 325 MG PO TABS
325.0000 mg | ORAL_TABLET | ORAL | Status: DC | PRN
  Administered 2013-03-06: 650 mg via ORAL
  Administered 2013-03-07 (×2): 325 mg via ORAL
  Administered 2013-03-07: 650 mg via ORAL
  Filled 2013-03-06 (×2): qty 1
  Filled 2013-03-06 (×2): qty 2

## 2013-03-06 MED ORDER — DOPAMINE-DEXTROSE 3.2-5 MG/ML-% IV SOLN
INTRAVENOUS | Status: DC | PRN
  Administered 2013-03-06: 5 ug/kg/min via INTRAVENOUS

## 2013-03-06 MED ORDER — LORATADINE 10 MG PO TABS
10.0000 mg | ORAL_TABLET | Freq: Every day | ORAL | Status: DC
  Administered 2013-03-06 – 2013-03-07 (×2): 10 mg via ORAL
  Filled 2013-03-06 (×2): qty 1

## 2013-03-06 MED ORDER — DOCUSATE SODIUM 100 MG PO CAPS
100.0000 mg | ORAL_CAPSULE | Freq: Two times a day (BID) | ORAL | Status: DC
  Administered 2013-03-06 – 2013-03-07 (×2): 100 mg via ORAL
  Filled 2013-03-06 (×3): qty 1

## 2013-03-06 MED ORDER — SODIUM CHLORIDE 0.9 % IV BOLUS (SEPSIS)
1000.0000 mL | Freq: Once | INTRAVENOUS | Status: AC
  Administered 2013-03-06: 1000 mL via INTRAVENOUS

## 2013-03-06 MED FILL — Medication: Qty: 1 | Status: AC

## 2013-03-06 NOTE — ED Notes (Signed)
Pt arrives via EMS from home with SOB and syncope for EMS that began at [facility] right hand, 15L NRB, 0.5mg  atropine. Pt alert, oriented x4, EMS EKG showed 3rd degree heart block. Systolic bp [id number] for EMS.

## 2013-03-06 NOTE — Progress Notes (Signed)
S/p emergent pacemaker today for complete heart block  She will need to ambulate Saturday am.  If no further difficulties and the patient is doing well, she could possibly go home late on Saturday.  If she is unable to ambulate or has additional problems, then would defer discharge to Sunday.  Routine wound care Follow-up in the device clinic in 10 days I will see in the office in 3 months

## 2013-03-06 NOTE — ED Notes (Signed)
Pts HR dropped to 30s, pt convulsing. Pt being paced at rate of 40 with 40 MA. Current HR at 41.  Pt reports pain with TV pacing.

## 2013-03-06 NOTE — ED Provider Notes (Signed)
TIME SEEN: 10:35 AM  CHIEF COMPLAINT: Shortness of breath, nausea, lightheadedness, diaphoresis  HPI: Patient is a 63 year old female with a history of asthma, lupus on prednisone and hydroxychloroquine, hypertension, valve replacement 4 years ago on Coumadin who is followed by Dr. Gwenlyn Found with cardiology who presents the emergency department with nausea, diaphoresis, lightheadedness and shortness of breath that started at 8:15 this morning when she woke up. No chest pain or chest discomfort. Patient called EMS because she states she felt so poorly. Upon EMS arrival, patient was bradycardic in the [id number] and had a systolic blood pressure in the 110s. Her EKG showed third-degree heart block. Patient denies a prior history of heart attack, stents.  Patient denies being on beta blockers or digoxin. She was given 0.5 mg of atropine by EMS without improvement of symptoms or heart rate.  ROS: See HPI Constitutional: no fever  Eyes: no drainage  ENT: no runny nose   Cardiovascular:  no chest pain  Resp: no SOB  GI: no vomiting GU: no dysuria Integumentary: no rash  Allergy: no hives  Musculoskeletal: no leg swelling  Neurological: no slurred speech ROS otherwise negative  PAST MEDICAL HISTORY/PAST SURGICAL HISTORY:  Past Medical History  Diagnosis Date  . Asthma   . Lupus (systemic lupus erythematosus)   . PVD (peripheral vascular disease)     60% R renal artery stenosis; non-functioning L kidney  . Hypertension   . Dyslipidemia   . Chronic anticoagulation   . History of echocardiogram 11/08/2011    EF >55%; mild-mod concentric LVH; trace aortic regurg.; LA mild-mod dilated  . History of nuclear stress test 01/20/2010    normal pattern of perfusion; low risk scan  . H/O Doppler ultrasound 10/03/2010    carotid duplex - R ICA normal patency; L CCA-ICA bypass gradt patent common to interal cartoid bypass graft   . H/O mechanical aortic valve replacement     MEDICATIONS:  Prior to Admission  medications   Medication Sig Start Date End Date Taking? Authorizing Provider  Calcium Carbonate (CALTRATE 600) 1500 MG TABS Take 1 tablet by mouth 2 (two) times daily.      Historical Provider, MD  cholecalciferol (VITAMIN D) 1000 UNITS tablet Take 1,000 Units by mouth daily.      Historical Provider, MD  Fluticasone-Salmeterol (ADVAIR DISKUS) 250-50 MCG/DOSE AEPB Inhale 1 puff into the lungs 2 (two) times daily. 02/17/13 04/22/14  Deneise Lever, MD  furosemide (LASIX) 80 MG tablet Take 80 mg by mouth daily.      Historical Provider, MD  hydroxychloroquine (PLAQUENIL) 200 MG tablet Take 1 tablet (200 mg total) by mouth 2 (two) times daily. 07/04/12   [name], MD  loratadine (CLARITIN) 10 MG tablet Take 10 mg by mouth daily.    Historical Provider, MD  Multiple Vitamins-Minerals (CENTRUM SILVER PO) Take 1 tablet by mouth daily.      Historical Provider, MD  oxymetazoline (AFRIN) 0.05 % nasal spray Place 2 sprays into the nose 2 (two) times daily.    Historical Provider, MD  predniSONE (DELTASONE) 5 MG tablet Take 5 mg by mouth daily.      Historical Provider, MD  ramipril (ALTACE) 5 MG capsule Take 1 capsule (5 mg total) by mouth daily. 02/26/13   [name], MD  simvastatin (ZOCOR) 20 MG tablet Take 1 tablet (20 mg total) by mouth at bedtime. 02/26/13   [name], MD  warfarin (COUMADIN) 5 MG tablet Take 1 & 1/2 to 2  tablets by mouth daily as directed 02/26/13   [name], MD    ALLERGIES:  No Known Allergies  SOCIAL HISTORY:  History  Substance Use Topics  . Smoking status: Never Smoker   . Smokeless tobacco: Never Used  . Alcohol Use: No    FAMILY HISTORY: Family History  Problem Relation Age of Onset  . Alzheimer's disease Father   . Cirrhosis Father   . Cancer Father     prostate    EXAM: BP 146/62  Pulse 35  Temp(Src) 97.9 F (36.6 C)  Resp 18  SpO2 100% CONSTITUTIONAL: Alert and oriented and responds appropriately to questions. Well-appearing;  well-nourished HEAD: Normocephalic EYES: Conjunctivae clear, PERRL ENT: normal nose; no rhinorrhea; moist mucous membranes; pharynx without lesions noted NECK: Supple, no meningismus, no LAD  CARD: Bradycardic; S1 and S2 appreciated; no murmurs, no clicks, no rubs, no gallops RESP: Normal chest excursion without splinting or tachypnea; breath sounds clear and equal bilaterally; no wheezes, no rhonchi, no rales,  ABD/GI: Normal bowel sounds; non-distended; soft, non-tender, no rebound, no guarding BACK:  The back appears normal and is non-tender to palpation, there is no CVA tenderness EXT: Normal ROM in all joints; non-tender to palpation; no edema; normal capillary refill; no cyanosis    SKIN: Normal color for age and race; warm NEURO: Moves all extremities equally PSYCH: The patient's mood and manner are appropriate. Grooming and personal hygiene are appropriate.  MEDICAL DECISION MAKING: Patient here with third degree heart block. She is normotensive and mentating normally. Pacer pads placed. No sign of ischemic changes on her EKG. Patient was given aspirin in the emergency department and Zofran. She was given one amp of bicarbonate and while receiving 1 g of calcium, patient became flushed and her heart rate jumped into the 160s and then quickly returned into the 80s. She is now in sinus rhythm and states feeling better. Will obtain labs including electrolytes, troponin, portable chest x-ray. Discussed with Trish with cardiology who will see the patient in the ED.  ED PROGRESS: Patient's labs are unremarkable. Lites normal. Troponin negative. Chest x-ray shows mild interstitial edema. Patient is stable.   11:49 AM  Cardiology, Dr. Katharina Caper, at bedside.  Patient became bradycardic again and back in third degree heart block. Given another dose of atropine and started dopamine and are currently pacing the patient. She is still normotensive and mentating. Plan is to take patient to the cardiac  catheterization lab for pacemaker placement.  EKG Interpretation    Date/Time:  Friday March 06 2013 10:32:43 EST Ventricular Rate:  37 PR Interval:  279 QRS Duration: 148 QT Interval:  520 QTC Calculation: 408 R Axis:   64 Text Interpretation:  Complete (3-degree) AV block Prolonged PR interval Probable left ventricular hypertrophy Anterior Q waves, possibly due to LVH Confirmed by Reyce Lubeck  DO, Hilario Robarts [phone]) on 03/06/2013 4:25:57 PM            EKG Interpretation    Date/Time:  Friday March 06 2013 10:50:44 EST Ventricular Rate:  82 PR Interval:  296 QRS Duration: 157 QT Interval:  402 QTC Calculation: 469 R Axis:   64 Text Interpretation:  Sinus rhythm Prolonged PR interval Right bundle branch block Confirmed by Jamisen Duerson  DO, Darell Saputo [id number]) on 03/06/2013 4:27:05 PM             CRITICAL CARE Performed by: Nyra Jabs   Total critical care time: 30 minutes  Critical care time was exclusive of separately billable  procedures and treating other patients.  Critical care was necessary to treat or prevent imminent or life-threatening deterioration.  Critical care was time spent personally by me on the following activities: development of treatment plan with patient and/or surrogate as well as nursing, discussions with consultants, evaluation of patient's response to treatment, examination of patient, obtaining history from patient or surrogate, ordering and performing treatments and interventions, ordering and review of laboratory studies, ordering and review of radiographic studies, pulse oximetry and re-evaluation of patient's condition.      [place], DO 03/06/13 1627

## 2013-03-06 NOTE — Code Documentation (Signed)
Pt with run of ST up to 160 bpm while giving calcium. Pts rate slowed a few minutes later.

## 2013-03-06 NOTE — Op Note (Signed)
SURGEON:  Thompson Grayer, MD     PREPROCEDURE DIAGNOSIS:  Symptomatic complete heart block    POSTPROCEDURE DIAGNOSIS:  Symptomatic complete heart block     PROCEDURES:   1.  Temporary transvenous pacemaker wire placement  2.   Left upper extremity venography.   3.   Pacemaker implantation.     INTRODUCTION:  [name] is a 63 y.o. female with a history of symptomatic complete heart block who presents today for pacemaker implantation.  She has a prior AVR with chronic intrinsic conduction disease by ekg.  The patient reports abrupt onset of dizziness and fatigue with presyncope today.  She presented to [facility] and was found to have complete heart block with no escape for which she required external pacing.  No reversible causes have been identified.  The patient therefore presents today for pacemaker implantation.     DESCRIPTION OF PROCEDURE:  Informed verbal consent was obtained, and the patient was brought to the electrophysiology lab emegently.  Her presenting rhythm was complete heart block with no escape and she was transcutaneously pacing.   The patient's left and right groins were prepped and draped in the usual sterile fashion by the EP lab staff.  Using a percutaneous Seldinger technique, a 6 F hemostasis sheath was placed into the right common femoral vein.  A 6 F quadrapolar josephson catheter was introduced into the right femoral vein and advanced into the right ventricle for recording and pacing.  Adequate threshold for ventricular capture was assured.  This temporary wire was left in place for the duration of the procedure.  The patients left chest was prepped and draped in the usual sterile fashion by the EP lab staff. The skin overlying the left deltopectoral region was infiltrated with lidocaine for local analgesia.  A 4-cm incision was made over the left deltopectoral region.  A left subcutaneous pacemaker pocket was fashioned using a combination of sharp and blunt dissection.  Electrocautery was required to assure hemostasis.    Left Upper Extremity Venography: A venogram of the left upper extremity was performed, which revealed a large left cephalic vein, which emptied into a large left subclavian vein.  The left axillary vein was moderate in size.    RA/RV Lead Placement: The left axillary vein was therefore cannulated.  Through the left axillary vein, a [facility] model [phone] (serial number  [id number]) right atrial lead and a [employer] model [phone] (serial number  [id number]) right ventricular lead were advanced with fluoroscopic visualization into the right atrial appendage and right ventricular apex positions respectively.  Initial atrial lead P- waves measured 2.5 mV with impedance of 543 ohms and a threshold of 1.4 V at 0.5 msec.  Right ventricular lead R-waves measured 12 mV with an impedance of 874 ohms and a threshold of 1.1 V at 0.5 msec.  Both leads were secured to the pectoralis fascia using #2-0 silk over the suture sleeves.   Device Placement:  The leads were then connected to a Victoria DR  model [id number] (serial number  [id number] ) pacemaker.  The pocket was irrigated with copious gentamicin solution.  The pacemaker was then placed into the pocket.  The pocket was then closed in 2 layers with 2.0 Vicryl suture for the subcutaneous and subcuticular layers.  Steri-  Strips and a sterile dressing were then applied.  The temporary transvenous pacing catheter was then removed under fluoroscopic visualization.  There were no early apparent complications.  The patient was  transferred to the holding area for sheath removal per protocol    CONCLUSIONS:   1. Successful implantation of a [employer] DR  dual-chamber pacemaker for symptomatic complete heart block  2. No early apparent complications.        Thompson Grayer, MD 03/06/2013 1:42 PM

## 2013-03-06 NOTE — Progress Notes (Signed)
ANTICOAGULATION CONSULT NOTE - Initial Consult  Pharmacy Consult for Coumadin Indication: mechanical valve  No Known Allergies  Patient Measurements: Height: 5' 4.5" (163.8 cm) Weight: 261 lb 14.5 oz (118.8 kg) IBW/kg (Calculated) : 55.85  Vital Signs: Temp: 97.5 F (36.4 C) (01/16 1035) Temp src: Rectal (01/16 1035) BP: 121/53 mmHg (01/16 1600) Pulse Rate: 77 (01/16 1115)  Labs:  Recent Labs  03/06/13 1032 03/06/13 1043  HGB 12.0 12.9  HCT 37.1 38.0  PLT 249  --   APTT 32  --   LABPROT 22.9*  --   INR 2.10*  --   CREATININE 1.42* 1.60*    Estimated Creatinine Clearance: 47.9 ml/min (by C-G formula based on Cr of 1.6).   Medical History: Past Medical History  Diagnosis Date  . Asthma   . Lupus (systemic lupus erythematosus)   . PVD (peripheral vascular disease)     60% R renal artery stenosis; non-functioning L kidney  . Hypertension   . Dyslipidemia   . Chronic anticoagulation   . History of echocardiogram 11/08/2011    EF >55%; mild-mod concentric LVH; trace aortic regurg.; LA mild-mod dilated  . History of nuclear stress test 01/20/2010    normal pattern of perfusion; low risk scan  . H/O Doppler ultrasound 10/03/2010    carotid duplex - R ICA normal patency; L CCA-ICA bypass gradt patent common to interal cartoid bypass graft   . H/O mechanical aortic valve replacement   . Complete heart block     Medications:  Coumadin 10mg  on Wednesdays, 7.5mg  all other days  Assessment: 63 year old female on chronic anticoagulation with Coumadin for mechanical AVR.  Her INR is subtherapeutic based on an INR goal of 2.5-3.5 (confirmed with her outpatient Coumadin clinic records), and she had a pacemaker placed today for symptomatic complete heart block.  Per Dr. Rayann Heman, request received to restart with her home regimen tonight given invasive procedure today.  I anticipate she may require a regimen adjustment prior to discharge given her presenting INR is  subtherapeutic.  Goal of Therapy:  INR 2.5-3.5   Plan:  Coumadin 7.5mg  today Daily PT/INR  Legrand Como, Pharm.D., BCPS, AAHIVP Clinical Pharmacist Phone: [phone] or [phone] 03/06/2013, 4:40 PM

## 2013-03-06 NOTE — Consult Note (Signed)
ELECTROPHYSIOLOGY CONSULT NOTE    Primary Care Physician: Jearld Lesch, DO Primary Cardiologist: Sandria Bales Date: 03/06/2013  Reason for consultation: heart block  [name] is a 63 y.o. female with a h/o valvular heart disease s/p AVR 10/2004, obesity, lupus, hypertension, and hyperlipidemia.  Last cath in 10/2004 demonstrated normal coronary arteries.  Last echo 10/2012 demonstrated normal EF.  This morning, she woke up and was dizzy and diaphoretic.  She called 911 and was found to be bradycardic with heart rates in the 30's.  She was given Atropine, Bicarb, and calcium with return to SR.  She then had recurrent complete heart block requiring transcutaneous pacing.  EP was asked to evaluate emergently for pacemaker placement. She is on no AV nodal blocking agents at home.  Lab work this admission is notable for creat of 1.6, negative troponin, K WNL. She requires emergent transcutaneous pacing.   Past Medical History  Diagnosis Date  . Asthma   . Lupus (systemic lupus erythematosus)   . PVD (peripheral vascular disease)     60% R renal artery stenosis; non-functioning L kidney  . Hypertension   . Dyslipidemia   . Chronic anticoagulation   . History of echocardiogram 11/08/2011    EF >55%; mild-mod concentric LVH; trace aortic regurg.; LA mild-mod dilated  . History of nuclear stress test 01/20/2010    normal pattern of perfusion; low risk scan  . H/O Doppler ultrasound 10/03/2010    carotid duplex - R ICA normal patency; L CCA-ICA bypass gradt patent common to interal cartoid bypass graft   . H/O mechanical aortic valve replacement    Past Surgical History  Procedure Laterality Date  . Aortic valve replacement  11/29/006    Gerhardt; St. Jude; on coumadin  . Exploratory laparotomy  04/12/2012    small bowel perf; ileostomy & R hemicolectomy   . Tubal ligation  11/1982  . Cholecystectomy  1999  . Cardiac catheterization  11/01/2004    define anatomy   . Carotid endarterectomy        lt.    [name] DOPamine      . midazolam       . [MAR HOLD] DOPamine 5 mcg/kg/min (03/06/13 1144)    No Known Allergies  History   Social History  . Marital Status: Divorced    Spouse Name: N/A    Number of Children: 3  . Years of Education: N/A   Occupational History  . Not on file.   Social History Main Topics  . Smoking status: Never Smoker   . Smokeless tobacco: Never Used  . Alcohol Use: No  . Drug Use: No  . Sexual Activity: Not on file   Other Topics Concern  . Not on file   Social History Narrative  . No narrative on file    Family History  Problem Relation Age of Onset  . Alzheimer's disease Father   . Cirrhosis Father   . Cancer Father     prostate    ROS- pt is too ill to provide  Physical Exam: Telemetry: Filed Vitals:   03/06/13 1035 03/06/13 1045 03/06/13 1115 03/06/13 1142  BP:  147/28 131/58   Pulse:  89 77   Temp: 97.5 F (36.4 C)     TempSrc: Rectal     Resp:  20 19   Weight:    260 lb 2.3 oz (118 kg)  SpO2:  100% 100%     GEN- The patient is ill appearing transcutaneously pacing, alert  and scared  Head- normocephalic, atraumatic Eyes-  Sclera clear, conjunctiva pink Ears- hearing intact Oropharynx- clear Neck- supple,  Lungs- Clear to ausculation bilaterally, normal work of breathing Heart- Regular rate and rhythm (with transcutaneous pacing) GI- soft, NT, ND, + BS, s/p prior colectomy Extremities- no clubbing, cyanosis, + edema MS- no significant deformity or atrophy Skin- no rash or lesion Psych- euthymic mood, full affect Neuro- strength and sensation are intact  EKG- complete heart block rate 37, LBBB, QRS 148  Labs:   Lab Results  Component Value Date   WBC 8.4 03/06/2013   HGB 12.9 03/06/2013   HCT 38.0 03/06/2013   MCV 84.9 03/06/2013   PLT 249 03/06/2013    Recent Labs Lab 03/06/13 1032 03/06/13 1043  NA 138 140  K 4.8 4.7  CL 103 106  CO2 22  --   BUN 25* 31*  CREATININE 1.42* 1.60*  CALCIUM 8.8  --    PROT 7.1  --   BILITOT 0.2*  --   ALKPHOS 110  --   ALT 46*  --   AST 49*  --   GLUCOSE 131* 136*   Radiology: 03-06-13 reviewed  Echo: reviewed from 10/2012  ASSESSMENT AND PLAN:    The patient has symptomatic complete heart block with an unstable escape for which she is transcutaneously pacing.  I would therefore recommend emergent pacemaker implantation at this time.  Risks, benefits, alternatives to pacemaker implantation were discussed in detail with the patient and her son today. The patient understands that the risks include but are not limited to bleeding, infection, pneumothorax, perforation, tamponade, vascular damage, renal failure, MI, stroke, death,  and lead dislodgement and wishes to proceed. We will therefore proceed emergently with temporary pacemaker placement followed by permanent pacemaker implant once stability is established.    Thompson Grayer, MD 03/06/2013  12:02 PM

## 2013-03-06 NOTE — Discharge Summary (Signed)
ELECTROPHYSIOLOGY PROCEDURE DISCHARGE SUMMARY    Patient ID: [name],  MRN: [id number], DOB/AGE: [birth year] 63 y.o.  Admit date: 03/06/2013 Discharge date: 03/07/2013  Primary Care Physician: Jearld Lesch, DO Primary Cardiologist: Quay Burow, MD Electrophysiologist: Thompson Grayer, MD  Primary Discharge Diagnosis:  Symptomatic complete heart block status post pacemaker implant this admission  Secondary Discharge Diagnosis:  1.  Lupus 2.  Asthma 3.  Hypertension 4.  Dyslipidemia 4.  Valvular heart disease - s/p mechanical AVR 2006 5.  Chronic anticoagulation 6.  H/o of small bowel perforation s/p hemicolectomy with subsequent takedown  No Known Allergies   Procedures This Admission:  1.  Implantation of a dual chamber pacemaker on 03-06-2013 by Dr Rayann Heman.  The patient received a STJ model number [id number] pacemaker with model number 2088 right ventricular lead.  There were no early apparent complications. 2.  CXR on 03-07-2013 demonstrated no complications from PPM implantation.  Brief HPI/Hospital Course:  [name] is a 63 y.o. female with a past medical history as outlined above.  She was in her usual state of health until the morning of admission when she developed diaphoresis and pre-syncope.  She called 911 and was found to be in heart block with a ventricular rate in the 30's.  She then developed asystole requiring transcutaneous pacing.  She was evaluated in the ER by Dr Rayann Heman and emergent pacemaker placement was recommended.  Risks, benefits, and alternatives were discussed with the patient and her son who agreed.  The patient underwent implantation of a STJ dual chamber pacemaker with details as outlined above.   She was monitored on telemetry overnight which demonstrated long first degree AVB, RBBB.  Left chest was without hematoma.  Mild ecchymosis.  The device was interrogated and found to be functioning normally.  RA P waves 2.7, imp 400 ohm, threshold 1.0@0 .4  ms. RV R waves >12 mV, imp 580 ohm, threshold 0.75@0 .4 ms. CXR was obtained and demonstrated no pneumothorax status post device implantation.  Wound care, arm mobility, and restrictions were reviewed with the patient.  SCr improved.   Ramipril continued at discharge.  Dr Sallyanne Kuster  examined the patient and considered them stable for discharge to home.   Discharge Vitals: Blood pressure 138/61, pulse 74, temperature 98.4 F (36.9 C), temperature source Oral, resp. rate 17, height 5' 4.5" (1.638 m), weight 261 lb 14.5 oz (118.8 kg), SpO2 98.00%.    Labs:   Lab Results  Component Value Date   WBC 6.0 03/07/2013   HGB 10.1* 03/07/2013   HCT 31.6* 03/07/2013   MCV 86.3 03/07/2013   PLT 237 03/07/2013     Recent Labs Lab 03/06/13 1032  03/07/13 0338  NA 138  < > 144  K 4.8  < > 5.1  CL 103  < > 110  CO2 22  --  24  BUN 25*  < > 23  CREATININE 1.42*  < > 1.23*  CALCIUM 8.8  --  9.0  PROT 7.1  --   --   BILITOT 0.2*  --   --   ALKPHOS 110  --   --   ALT 46*  --   --   AST 49*  --   --   GLUCOSE 131*  < > 105*  < > = values in this interval not displayed.   Discharge Medications:    Medication List         acetaminophen 325 MG tablet  Commonly known as:  TYLENOL  Take 1-2 tablets (325-650 mg total) by mouth every 4 (four) hours as needed for mild pain.     aspirin 81 MG EC tablet  Take 1 tablet (81 mg total) by mouth daily.     CALTRATE 600 1500 MG Tabs  Generic drug:  Calcium Carbonate  Take 1 tablet by mouth 2 (two) times daily.     CENTRUM SILVER PO  Take 1 tablet by mouth daily.     cholecalciferol 1000 UNITS tablet  Commonly known as:  VITAMIN D  Take 1,000 Units by mouth 2 (two) times daily.     Fluticasone-Salmeterol 250-50 MCG/DOSE Aepb  Commonly known as:  ADVAIR DISKUS  Inhale 1 puff into the lungs 2 (two) times daily.     furosemide 80 MG tablet  Commonly known as:  LASIX  Take 80 mg by mouth daily.     hydroxychloroquine 200 MG tablet  Commonly known  as:  PLAQUENIL  Take 1 tablet (200 mg total) by mouth 2 (two) times daily.     loratadine 10 MG tablet  Commonly known as:  CLARITIN  Take 10 mg by mouth daily.     predniSONE 5 MG tablet  Commonly known as:  DELTASONE  Take 5 mg by mouth daily.     ramipril 5 MG capsule  Commonly known as:  ALTACE  Take 1 capsule (5 mg total) by mouth daily.     simvastatin 20 MG tablet  Commonly known as:  ZOCOR  Take 1 tablet (20 mg total) by mouth at bedtime.     warfarin 5 MG tablet  Commonly known as:  COUMADIN  Take 7.5-10 mg by mouth daily. On Wednesday take 10mg . All other days take 7.5mg         Disposition:  Discharge Orders   Future Appointments Provider Department Dept Phone   03/16/2013 3:30 PM [facility] [place] [facility] [phone]   04/22/2013 2:45 PM [name], MD [facility] [phone]   11/05/2013 1:30 PM Deneise Lever, MD [facility] [phone]   Future Orders Complete By Expires   Call MD for:  redness, tenderness, or signs of infection (pain, swelling, redness, odor or green/yellow discharge around incision site)  As directed    Diet - low sodium heart healthy  As directed    Increase activity slowly  As directed      Follow-up Information   Please follow up. (Appointmets are listed under Discharge instructions.)       Duration of Discharge Encounter: Greater than 30 minutes including physician time.  Signed, Junko Ohagan 11:55 AM

## 2013-03-06 NOTE — H&P (Addendum)
[name] is an 63 y.o. female.    Primary Cardiologist:Dr. Gwenlyn Found PCP:  Jearld Lesch, DO  Chief Complaint: dizzy, SOB, nausea HPI:   63 year old WF, moderately overweight, almost divorced Caucasian female mother of 15, grandmother to 2 grandchildren who has had a St. Jude AVR performed by Dr. Ceasar Mons September of 2006 because of severe aortic insufficiency. She had normal coronary arteries by cath at that time. A look at her renal arteries and demonstrated a 60% right renal artery stenosis and patent accessory left renal arteries. By duplex she does have a non-functioning left kidney. Her other problems include hypertension and hyperlipidemia. She also has a history of lupus followed by Dr. Jalene Mullet. An echo performed 11/04/12 showed a well-functioning aortic prosthesis, EF 55-60% with mild concentric hypertrophy, and recent renal Dopplers revealed a widely patent right renal artery.  Last nuc stress test 01/20/2010 negative for ischemia.  Pt had been doing well in her usual state of health until this AM.  She awoke at 0800 and felt dizzy, sweaty and nauseated while lying in bed.  She did get up let the dogs out but began feeling unwell + SOB and dizzy.  Knew something was wrong and called 911.  She was bradycardic with HR in the 30s Atropine 0.5 mg IV given and pt transported to [facility].  Here she rec'd 1 amp Bicarb, 1 amp calcium and HR up to 160.  She then broke to SR with 1st degree AV block.  PR 296 ms and RBBB, Hx 1st degree  At 270 ms, but the RBBB is new from 05/2012.   EKG in brady with 3:1 block vs. CHB though at times resembles slow a flutter.    She had no pain only SOB and this is resolved currently.  Labs: cr up slightly to 1.60 was 1.39 6 months ago. Troponin marker neg.  No recent colds or change in meds. Anticoagulation on coumadin- INR 2.10.     Past Medical History  Diagnosis Date  . Asthma   . Lupus (systemic lupus erythematosus)   . PVD (peripheral vascular  disease)     60% R renal artery stenosis; non-functioning L kidney  . Hypertension   . Dyslipidemia   . Chronic anticoagulation   . History of echocardiogram 11/08/2011    EF >55%; mild-mod concentric LVH; trace aortic regurg.; LA mild-mod dilated  . History of nuclear stress test 01/20/2010    normal pattern of perfusion; low risk scan  . H/O Doppler ultrasound 10/03/2010    carotid duplex - R ICA normal patency; L CCA-ICA bypass gradt patent common to interal cartoid bypass graft   . H/O mechanical aortic valve replacement     Past Surgical History  Procedure Laterality Date  . Aortic valve replacement  11/29/006    Gerhardt; St. Jude; on coumadin  . Exploratory laparotomy  04/12/2012    small bowel perf; ileostomy & R hemicolectomy   . Tubal ligation  11/1982  . Cholecystectomy  1999  . Cardiac catheterization  11/01/2004    define anatomy   . Carotid endarterectomy      lt.    Family History  Problem Relation Age of Onset  . Alzheimer's disease Father   . Cirrhosis Father   . Cancer Father     prostate   Social History:  reports that she has never smoked. She has never used smokeless tobacco. She reports that she does not drink alcohol or use  illicit drugs. Single.   Allergies: No Known Allergies  OUTPATIENT MEDICATIONS: caltrate 600 1500 mg 1 twice a day Vit D 1000 daily Advair diskus 250-50 mcg/dose 1 inh BID Lasix 80 mg daily Plaquenil 200 mg BID Claritin 10 mg daily Centrum silver 1 daily Afrin 0.05% 2 sprays BID PREDISONE 5 MG DAILY  Results for orders placed during the hospital encounter of 03/06/13 (from the past 48 hour(s))  CBC WITH DIFFERENTIAL     Status: Abnormal   Collection Time    03/06/13 10:32 AM      Result Value Range   WBC 8.4  4.0 - 10.5 K/uL   RBC 4.37  3.87 - 5.11 MIL/uL   Hemoglobin 12.0  12.0 - 15.0 g/dL   HCT 37.1  36.0 - 46.0 %   MCV 84.9  78.0 - 100.0 fL   MCH 27.5  26.0 - 34.0 pg   MCHC 32.3  30.0 - 36.0 g/dL   RDW 15.6 (*)  11.5 - 15.5 %   Platelets 249  150 - 400 K/uL   Neutrophils Relative % 67  43 - 77 %   Neutro Abs 5.6  1.7 - 7.7 K/uL   Lymphocytes Relative 20  12 - 46 %   Lymphs Abs 1.7  0.7 - 4.0 K/uL   Monocytes Relative 11  3 - 12 %   Monocytes Absolute 0.9  0.1 - 1.0 K/uL   Eosinophils Relative 2  0 - 5 %   Eosinophils Absolute 0.2  0.0 - 0.7 K/uL   Basophils Relative 1  0 - 1 %   Basophils Absolute 0.0  0.0 - 0.1 K/uL  COMPREHENSIVE METABOLIC PANEL     Status: Abnormal   Collection Time    03/06/13 10:32 AM      Result Value Range   Sodium 138  137 - 147 mEq/L   Potassium 4.8  3.7 - 5.3 mEq/L   Chloride 103  96 - 112 mEq/L   CO2 22  19 - 32 mEq/L   Glucose, Bld 131 (*) 70 - 99 mg/dL   BUN 25 (*) 6 - 23 mg/dL   Creatinine, Ser 1.42 (*) 0.50 - 1.10 mg/dL   Calcium 8.8  8.4 - 10.5 mg/dL   Total Protein 7.1  6.0 - 8.3 g/dL   Albumin 3.2 (*) 3.5 - 5.2 g/dL   AST 49 (*) 0 - 37 U/L   Comment: HEMOLYSIS AT THIS LEVEL MAY AFFECT RESULT   ALT 46 (*) 0 - 35 U/L   Alkaline Phosphatase 110  39 - 117 U/L   Total Bilirubin 0.2 (*) 0.3 - 1.2 mg/dL   GFR calc non Af Amer 39 (*) >90 mL/min   GFR calc Af Amer 45 (*) >90 mL/min   Comment: (NOTE)     The eGFR has been calculated using the CKD EPI equation.     This calculation has not been validated in all clinical situations.     eGFR's persistently <90 mL/min signify possible Chronic Kidney     Disease.  APTT     Status: None   Collection Time    03/06/13 10:32 AM      Result Value Range   aPTT 32  24 - 37 seconds  PROTIME-INR     Status: Abnormal   Collection Time    03/06/13 10:32 AM      Result Value Range   Prothrombin Time 22.9 (*) 11.6 - 15.2 seconds   INR 2.10 (*) 0.00 -  1.49  POCT I-STAT TROPONIN I     Status: None   Collection Time    03/06/13 10:41 AM      Result Value Range   Troponin i, poc 0.01  0.00 - 0.08 ng/mL   Comment 3            Comment: Due to the release kinetics of cTnI,     a negative result within the first hours       of the onset of symptoms does not rule out     myocardial infarction with certainty.     If myocardial infarction is still suspected,     repeat the test at appropriate intervals.  POCT I-STAT, CHEM 8     Status: Abnormal   Collection Time    03/06/13 10:43 AM      Result Value Range   Sodium 140  137 - 147 mEq/L   Potassium 4.7  3.7 - 5.3 mEq/L   Chloride 106  96 - 112 mEq/L   BUN 31 (*) 6 - 23 mg/dL   Creatinine, Ser 1.60 (*) 0.50 - 1.10 mg/dL   Glucose, Bld 136 (*) 70 - 99 mg/dL   Calcium, Ion 1.15  1.13 - 1.30 mmol/L   TCO2 23  0 - 100 mmol/L   Hemoglobin 12.9  12.0 - 15.0 g/dL   HCT 38.0  36.0 - 46.0 %  GLUCOSE, CAPILLARY     Status: Abnormal   Collection Time    03/06/13 10:45 AM      Result Value Range   Glucose-Capillary 114 (*) 70 - 99 mg/dL   Comment 1 Documented in Chart     Comment 2 Notify RN     Dg Chest Portable 1 View  03/06/2013   CLINICAL DATA:  Short of breath.  Loss of consciousness.  EXAM: PORTABLE CHEST - 1 VIEW  COMPARISON:  02/22/2005  FINDINGS: Extensive artifact overlies chest. There has been previous median sternotomy. Heart size appears normal. There is a poor inspiratory effort. There could be venous hypertension with early interstitial edema. No effusions. No acute bony finding.  IMPRESSION: Previous median sternotomy. Poor inspiration. Cannot rule out venous hypertension with mild interstitial edema.   Electronically Signed   By: Nelson Chimes M.D.   On: 03/06/2013 11:19    ROS: General:no colds or fevers, no weight changes Skin:no rashes or ulcers HEENT:no blurred vision, no congestion CV:see HPI PUL:see HPI GI:no diarrhea constipation or melena, no indigestion GU:no hematuria, no dysuria MS:no joint pain, no claudication Neuro:no syncope, DIZZINESS alone today no syncope or near syncope Endo:no diabetes, no thyroid disease   Blood pressure 131/58, pulse 77, temperature 97.5 F (36.4 C), temperature source Rectal, resp. rate 19, weight 260  lb 2.3 oz (118 kg), SpO2 100.00%. PE: General:Pleasant affect, NAD though anxious Skin:Warm and dry, brisk capillary refill HEENT:normocephalic, sclera clear, mucus membranes moist Neck:supple, no JVD, no bruits  Heart:S1S2 RRR without murmur, crisp closure of valve,  gallup, rub or click Lungs:clear without rales, rhonchi, or wheezes RKY:HCWC, non tender, + BS, do not palpate liver spleen or masses Ext:no lower ext edema, 2+ pedal pulses, 2+ radial pulses Neuro:alert and oriented, MAE, follows commands, + facial symmetry   Addenedum:  Pt developed ventricular asystole > 6 sec and became unresponsive.  HR came back CHB at 30 and pt woke answering questions.   External pacer turned on and Pt externally pacing.  Dr. Acie Fredrickson in the room at the time.  Dr. Rayann Heman made  aware with plans for temp wire.  Pt with pain with external pacing.  Pt's family in room at time of episode.  Dr. Acie Fredrickson and I have talked to them explaining what happened and the need for pacemaker.    Assessment/Plan Principal Problem:   Symptomatic bradycardia Active Problems:   Asystole, with syncope   AORTIC VALVE REPLACEMENT, HX OF, 2006 with mechanical valve   Anticoagulated on Coumadin for mechanical valve  PLAN:  Continue ext. Pacer until temp wire in place.  To cath lab now for pacer.  Will need permanent pacer as well.    [place] Practitioner Certified [place] Pager [phone] or after 5pm or weekends call [phone] 03/06/2013, 11:56 AM   Attending Note:   The patient was seen and examined.  Agree with assessment and plan as noted above.  Changes made to the above note as needed.  Hx of lupus.  See history / physical  as noted above by Cecilie Kicks, PA.  She is not on any meds that would cause her to be bradycardic.  While I was talking to patient, she suddenly went into complete heart block and lost her pulse.   She was temp paced externally.  Dopamine was started.  Atropine  was given.     She was transported up to the cath lab for permanent pacer placement.  She has a mechanical AV and her INR is 2.1.   Thayer Headings, Brooke Bonito., MD, [facility] 03/06/2013, 12:00 PM

## 2013-03-07 ENCOUNTER — Inpatient Hospital Stay (HOSPITAL_COMMUNITY): Payer: Medicare Other

## 2013-03-07 LAB — CBC
HEMATOCRIT: 31.6 % — AB (ref 36.0–46.0)
Hemoglobin: 10.1 g/dL — ABNORMAL LOW (ref 12.0–15.0)
MCH: 27.6 pg (ref 26.0–34.0)
MCHC: 32 g/dL (ref 30.0–36.0)
MCV: 86.3 fL (ref 78.0–100.0)
PLATELETS: 237 10*3/uL (ref 150–400)
RBC: 3.66 MIL/uL — AB (ref 3.87–5.11)
RDW: 16 % — AB (ref 11.5–15.5)
WBC: 6 10*3/uL (ref 4.0–10.5)

## 2013-03-07 LAB — BASIC METABOLIC PANEL
BUN: 23 mg/dL (ref 6–23)
CALCIUM: 9 mg/dL (ref 8.4–10.5)
CHLORIDE: 110 meq/L (ref 96–112)
CO2: 24 meq/L (ref 19–32)
Creatinine, Ser: 1.23 mg/dL — ABNORMAL HIGH (ref 0.50–1.10)
GFR calc Af Amer: 54 mL/min — ABNORMAL LOW (ref >90)
GFR calc non Af Amer: 47 mL/min — ABNORMAL LOW (ref >90)
Glucose, Bld: 105 mg/dL — ABNORMAL HIGH (ref 70–99)
Potassium: 5.1 mEq/L (ref 3.7–5.3)
SODIUM: 144 meq/L (ref 137–147)

## 2013-03-07 LAB — PROTIME-INR
INR: 2.61 — ABNORMAL HIGH (ref 0.00–1.49)
Prothrombin Time: 27 seconds — ABNORMAL HIGH (ref 11.6–15.2)

## 2013-03-07 MED ORDER — ASPIRIN 81 MG PO TBEC: 81.0000 mg | DELAYED_RELEASE_TABLET | Freq: Every day | ORAL | Status: DC

## 2013-03-07 MED ORDER — ACETAMINOPHEN 325 MG PO TABS: 325.0000 mg | ORAL_TABLET | ORAL | Status: DC | PRN

## 2013-03-07 MED ORDER — WARFARIN SODIUM 7.5 MG PO TABS
7.5000 mg | ORAL_TABLET | Freq: Once | ORAL | Status: DC
  Filled 2013-03-07: qty 1

## 2013-03-07 MED ORDER — WARFARIN SODIUM 5 MG PO TABS: 5.0000 mg | ORAL_TABLET | Freq: Every day | ORAL | Status: DC

## 2013-03-07 NOTE — Progress Notes (Signed)
Subjective: No complaints  Objective: Vital signs in last 24 hours: Temp:  [97.2 F (36.2 C)-98.4 F (36.9 C)] 98.4 F (36.9 C) (01/17 0805) Pulse Rate:  [35-89] 74 (01/17 0835) Resp:  [13-21] 17 (01/17 0835) BP: (114-147)/(28-62) 138/61 mmHg (01/17 0835) SpO2:  [96 %-100 %] 98 % (01/17 0835) Weight:  [260 lb 2.3 oz (118 kg)-261 lb 14.5 oz (118.8 kg)] 261 lb 14.5 oz (118.8 kg) (01/16 1600) Last BM Date: 03/06/13  Intake/Output from previous day: 01/16 0701 - 01/17 0700 In: 41 [P.O.:460; IV Piggyback:150] Out: 400 [Urine:400] Intake/Output this shift:    Medications Current Facility-Administered Medications  Medication Dose Route Frequency Provider Last Rate Last Dose  . 0.9 %  sodium chloride infusion  250 mL Intravenous PRN Thompson Grayer, MD      . acetaminophen (TYLENOL) tablet 325-650 mg  325-650 mg Oral Q4H PRN Thompson Grayer, MD   325 mg at 03/07/13 0808  . alum & mag hydroxide-simeth (MAALOX/MYLANTA) 200-200-20 MG/5ML suspension 30 mL  30 mL Oral Q6H PRN Cecilie Kicks, NP      . aspirin EC tablet 81 mg  81 mg Oral Daily Cecilie Kicks, NP   81 mg at 03/06/13 1809  . atropine injection   Intravenous PRN Thayer Headings, MD   0.5 mg at 03/06/13 1144  . calcium carbonate (OS-CAL - dosed in mg of elemental calcium) tablet 500 mg of elemental calcium  1 tablet Oral BID WC Thompson Grayer, MD   500 mg of elemental calcium at 03/07/13 0808  . cholecalciferol (VITAMIN D) tablet 1,000 Units  1,000 Units Oral Daily Cecilie Kicks, NP   1,000 Units at 03/06/13 1809  . docusate sodium (COLACE) capsule 100 mg  100 mg Oral BID Cecilie Kicks, NP   100 mg at 03/06/13 2255  . HYDROcodone-acetaminophen (NORCO/VICODIN) 5-325 MG per tablet 1-2 tablet  1-2 tablet Oral Q4H PRN Thompson Grayer, MD      . hydroxychloroquine (PLAQUENIL) tablet 200 mg  200 mg Oral BID Cecilie Kicks, NP   200 mg at 03/06/13 2255  . loratadine (CLARITIN) tablet 10 mg  10 mg Oral Daily Cecilie Kicks, NP   10 mg at 03/06/13 1809    . mometasone-formoterol (DULERA) 100-5 MCG/ACT inhaler 2 puff  2 puff Inhalation BID Cecilie Kicks, NP   2 puff at 03/07/13 [phone]  . ondansetron (ZOFRAN) injection 4 mg  4 mg Intravenous Q6H PRN Thompson Grayer, MD      . predniSONE (DELTASONE) tablet 5 mg  5 mg Oral Daily Cecilie Kicks, NP   5 mg at 03/06/13 1809  . simvastatin (ZOCOR) tablet 20 mg  20 mg Oral QHS Cecilie Kicks, NP   20 mg at 03/06/13 2255  . sodium chloride 0.9 % injection 3 mL  3 mL Intravenous Q12H Thompson Grayer, MD      . sodium chloride 0.9 % injection 3 mL  3 mL Intravenous PRN Thompson Grayer, MD      . Warfarin - Pharmacist Dosing Inpatient   Does not apply q1800 Norva Riffle, RPH      . zolpidem (AMBIEN) tablet 5 mg  5 mg Oral QHS PRN Cecilie Kicks, NP        PE: General appearance: alert, cooperative and no distress Lungs: clear to auscultation bilaterally Heart: regular rate and rhythm and 1/6 sys MM.  Mech valve clicks. Extremities: No LEE Pulses: 2+ and symmetric Skin: Warm and dry.  No blood on the wound dressing. Neurologic: Grossly  normal  Lab Results:   Recent Labs  03/06/13 1032 03/06/13 1043 03/07/13 0338  WBC 8.4  --  6.0  HGB 12.0 12.9 10.1*  HCT 37.1 38.0 31.6*  PLT 249  --  237   BMET  Recent Labs  03/06/13 1032 03/06/13 1043 03/07/13 0338  NA 138 140 144  K 4.8 4.7 5.1  CL 103 106 110  CO2 22  --  24  GLUCOSE 131* 136* 105*  BUN 25* 31* 23  CREATININE 1.42* 1.60* 1.23*  CALCIUM 8.8  --  9.0   PT/INR  Recent Labs  03/06/13 1032 03/07/13 0338  LABPROT 22.9* 27.0*  INR 2.10* 2.61*     Assessment/Plan   Principal Problem:   Symptomatic bradycardia Active Problems:   AORTIC VALVE REPLACEMENT, HX OF, 2006 with mechanical valve   Asystole, with syncope   Anticoagulated on Coumadin for mechanical valve   Complete heart block  Plan:  SP emergent St Jude Medical Assurity DR model [id number] (serial number [id number] ) pacemaker implant for complete heart block.  HR and BP stable.   No apparent oozing on the dressing.  No complications seen on CXR.  Ambulate an DC home.  Wound check in 10 days.  INR therapeutic.      LOS: 1 day    HAGER, BRYAN 03/07/2013 9:17 AM  I have seen and examined the patient along with Tarri Fuller, PA.  I have reviewed the chart, notes and new data.  I agree with PA/NP's note.  Key new complaints: minimal soreness at site Key examination changes: ecchymosis aound incision, but no hematoma Key new findings / data: CXR looks good. Good lead parameters  RA P waves 2.7, imp 400 ohm, threshold 1.0@0 .4 ms RV R waves >12 mV, imp 580 ohm, threshold 0.75@0 .4 ms  PLAN: DC home. Wound check 7-10 days. Wound care and activity restrictions reviewed.  Sanda Klein, MD, [place] [phone] 03/07/2013, 11:07 AM

## 2013-03-07 NOTE — Progress Notes (Signed)
ANTICOAGULATION CONSULT NOTE - Follow-up Consult  Pharmacy Consult for Coumadin Indication: mechanical valve  No Known Allergies  Patient Measurements: Height: 5' 4.5" (163.8 cm) Weight: 261 lb 14.5 oz (118.8 kg) IBW/kg (Calculated) : 55.85  Vital Signs: Temp: 98.6 F (37 C) (01/17 1215) Temp src: Oral (01/17 1215) BP: 131/45 mmHg (01/17 1215) Pulse Rate: 74 (01/17 0835)  Labs:  Recent Labs  03/06/13 1032 03/06/13 1043 03/07/13 0338  HGB 12.0 12.9 10.1*  HCT 37.1 38.0 31.6*  PLT 249  --  237  APTT 32  --   --   LABPROT 22.9*  --  27.0*  INR 2.10*  --  2.61*  CREATININE 1.42* 1.60* 1.23*    Estimated Creatinine Clearance: 62.3 ml/min (by C-G formula based on Cr of 1.23).   Medications:  PTA regimen: Coumadin 10mg  on Wednesdays, 7.5mg  all other days  Assessment: 63 year old female on chronic anticoagulation with Coumadin for mechanical AVR.  Her INR was subtherapeutic on admit based on an INR goal of 2.5-3.5 (confirmed with her outpatient Coumadin clinic records), and she had a pacemaker placed yesterday for symptomatic complete heart block.  I anticipate she may require a regimen adjustment prior to discharge given her presenting INR is subtherapeutic.  INR today is therapeutic and increased by 0.5 from yesterday.  No sign/sx of bleeding noted.  Will give 7.5mg  again today since large increase in INR.   Goal of Therapy:  INR 2.5-3.5   Plan:  Coumadin 7.5mg  today Daily PT/INR  Thank you, Vivia Ewing, PharmD Clinical Pharmacist - Resident Pager: [phone] Pharmacy: [phone] 03/07/2013 12:25 PM

## 2013-03-07 NOTE — Discharge Instructions (Signed)
Disposition:   Future Appointments  Provider  Department  Dept Phone    03/16/2013 3:30 PM  [facility]  [phone]    04/22/2013 2:45 PM  [name], MD  [facility]  [phone]    11/05/2013 1:30 PM  Deneise Lever, MD  [facility]  [phone]

## 2013-03-08 ENCOUNTER — Encounter (HOSPITAL_COMMUNITY): Payer: Self-pay | Admitting: Emergency Medicine

## 2013-03-08 ENCOUNTER — Emergency Department (HOSPITAL_COMMUNITY): Payer: Medicare Other

## 2013-03-08 ENCOUNTER — Observation Stay (HOSPITAL_COMMUNITY)
Admission: EM | Admit: 2013-03-08 | Discharge: 2013-03-09 | Disposition: A | Discharge status: Home / Self Care | Payer: Medicare Other | Attending: Cardiology | Admitting: Cardiology

## 2013-03-08 DIAGNOSIS — Z7901 Long term (current) use of anticoagulants: Secondary | ICD-10-CM

## 2013-03-08 DIAGNOSIS — I701 Atherosclerosis of renal artery: Secondary | ICD-10-CM

## 2013-03-08 DIAGNOSIS — R5383 Other fatigue: Secondary | ICD-10-CM

## 2013-03-08 DIAGNOSIS — Z952 Presence of prosthetic heart valve: Secondary | ICD-10-CM

## 2013-03-08 DIAGNOSIS — Z950 Presence of cardiac pacemaker: Secondary | ICD-10-CM | POA: Insufficient documentation

## 2013-03-08 DIAGNOSIS — R51 Headache: Secondary | ICD-10-CM | POA: Insufficient documentation

## 2013-03-08 DIAGNOSIS — I739 Peripheral vascular disease, unspecified: Secondary | ICD-10-CM | POA: Insufficient documentation

## 2013-03-08 DIAGNOSIS — Z9889 Other specified postprocedural states: Secondary | ICD-10-CM | POA: Insufficient documentation

## 2013-03-08 DIAGNOSIS — M329 Systemic lupus erythematosus, unspecified: Secondary | ICD-10-CM | POA: Diagnosis present

## 2013-03-08 DIAGNOSIS — J4520 Mild intermittent asthma, uncomplicated: Secondary | ICD-10-CM | POA: Diagnosis present

## 2013-03-08 DIAGNOSIS — Z79899 Other long term (current) drug therapy: Secondary | ICD-10-CM | POA: Insufficient documentation

## 2013-03-08 DIAGNOSIS — Z5181 Encounter for therapeutic drug level monitoring: Secondary | ICD-10-CM

## 2013-03-08 DIAGNOSIS — I442 Atrioventricular block, complete: Secondary | ICD-10-CM | POA: Diagnosis present

## 2013-03-08 DIAGNOSIS — I10 Essential (primary) hypertension: Secondary | ICD-10-CM | POA: Insufficient documentation

## 2013-03-08 DIAGNOSIS — I951 Orthostatic hypotension: Secondary | ICD-10-CM | POA: Diagnosis present

## 2013-03-08 DIAGNOSIS — R42 Dizziness and giddiness: Principal | ICD-10-CM | POA: Diagnosis present

## 2013-03-08 DIAGNOSIS — J45909 Unspecified asthma, uncomplicated: Secondary | ICD-10-CM | POA: Insufficient documentation

## 2013-03-08 DIAGNOSIS — Z954 Presence of other heart-valve replacement: Secondary | ICD-10-CM

## 2013-03-08 DIAGNOSIS — R5381 Other malaise: Secondary | ICD-10-CM | POA: Insufficient documentation

## 2013-03-08 DIAGNOSIS — Z7982 Long term (current) use of aspirin: Secondary | ICD-10-CM | POA: Insufficient documentation

## 2013-03-08 DIAGNOSIS — E785 Hyperlipidemia, unspecified: Secondary | ICD-10-CM | POA: Diagnosis present

## 2013-03-08 LAB — URINALYSIS, ROUTINE W REFLEX MICROSCOPIC
Bilirubin Urine: NEGATIVE
Glucose, UA: NEGATIVE mg/dL
Hgb urine dipstick: NEGATIVE
KETONES UR: NEGATIVE mg/dL
LEUKOCYTES UA: NEGATIVE
NITRITE: NEGATIVE
PROTEIN: 30 mg/dL — AB
Specific Gravity, Urine: 1.03 (ref 1.005–1.030)
Urobilinogen, UA: 0.2 mg/dL (ref 0.0–1.0)
pH: 5 (ref 5.0–8.0)

## 2013-03-08 LAB — URINE MICROSCOPIC-ADD ON

## 2013-03-08 LAB — POCT I-STAT, CHEM 8
BUN: 21 mg/dL (ref 6–23)
CHLORIDE: 106 meq/L (ref 96–112)
Calcium, Ion: 1.2 mmol/L (ref 1.13–1.30)
Creatinine, Ser: 1.3 mg/dL — ABNORMAL HIGH (ref 0.50–1.10)
GLUCOSE: 97 mg/dL (ref 70–99)
HCT: 36 % (ref 36.0–46.0)
Hemoglobin: 12.2 g/dL (ref 12.0–15.0)
POTASSIUM: 4.4 meq/L (ref 3.7–5.3)
SODIUM: 142 meq/L (ref 137–147)
TCO2: 26 mmol/L (ref 0–100)

## 2013-03-08 LAB — PROTIME-INR
INR: 2.59 — ABNORMAL HIGH (ref 0.00–1.49)
Prothrombin Time: 26.9 seconds — ABNORMAL HIGH (ref 11.6–15.2)

## 2013-03-08 MED ORDER — ASPIRIN 81 MG PO CHEW
324.0000 mg | CHEWABLE_TABLET | ORAL | Status: DC
  Filled 2013-03-08: qty 4

## 2013-03-08 MED ORDER — ONDANSETRON HCL 4 MG/2ML IJ SOLN: 4.0000 mg | Freq: Four times a day (QID) | INTRAMUSCULAR | Status: DC | PRN

## 2013-03-08 MED ORDER — ASPIRIN 81 MG PO CHEW
243.0000 mg | CHEWABLE_TABLET | ORAL | Status: AC
  Administered 2013-03-08: 243 mg via ORAL
  Filled 2013-03-08: qty 3

## 2013-03-08 MED ORDER — ASPIRIN EC 81 MG PO TBEC: 81.0000 mg | DELAYED_RELEASE_TABLET | Freq: Every day | ORAL | Status: DC

## 2013-03-08 MED ORDER — ACETAMINOPHEN 325 MG PO TABS: 325.0000 mg | ORAL_TABLET | ORAL | Status: DC | PRN

## 2013-03-08 MED ORDER — SODIUM CHLORIDE 0.9 % IV BOLUS (SEPSIS)
500.0000 mL | Freq: Once | INTRAVENOUS | Status: AC
  Administered 2013-03-08: 500 mL via INTRAVENOUS

## 2013-03-08 MED ORDER — WARFARIN SODIUM 10 MG PO TABS: 10.0000 mg | ORAL_TABLET | ORAL | Status: DC

## 2013-03-08 MED ORDER — SODIUM CHLORIDE 0.45 % IV SOLN
INTRAVENOUS | Status: DC
  Administered 2013-03-08: 19:00:00 via INTRAVENOUS

## 2013-03-08 MED ORDER — PREDNISONE 5 MG PO TABS
5.0000 mg | ORAL_TABLET | Freq: Every day | ORAL | Status: DC
  Administered 2013-03-08 – 2013-03-09 (×2): 5 mg via ORAL
  Filled 2013-03-08 (×3): qty 1

## 2013-03-08 MED ORDER — WARFARIN SODIUM 7.5 MG PO TABS: 7.5000 mg | ORAL_TABLET | Freq: Every day | ORAL | Status: DC

## 2013-03-08 MED ORDER — FUROSEMIDE 80 MG PO TABS
80.0000 mg | ORAL_TABLET | Freq: Every day | ORAL | Status: DC
  Filled 2013-03-08: qty 1

## 2013-03-08 MED ORDER — CENTRUM SILVER PO TABS: ORAL_TABLET | Freq: Every morning | ORAL | Status: DC

## 2013-03-08 MED ORDER — LORATADINE 10 MG PO TABS
10.0000 mg | ORAL_TABLET | Freq: Every day | ORAL | Status: DC
  Administered 2013-03-08 – 2013-03-09 (×2): 10 mg via ORAL
  Filled 2013-03-08 (×2): qty 1

## 2013-03-08 MED ORDER — NITROGLYCERIN 0.4 MG SL SUBL: 0.4000 mg | SUBLINGUAL_TABLET | SUBLINGUAL | Status: DC | PRN

## 2013-03-08 MED ORDER — CALCIUM CARBONATE 1250 (500 CA) MG PO TABS
1.0000 | ORAL_TABLET | Freq: Two times a day (BID) | ORAL | Status: DC
  Administered 2013-03-08 – 2013-03-09 (×2): 500 mg via ORAL
  Filled 2013-03-08 (×3): qty 1

## 2013-03-08 MED ORDER — VITAMIN D3 25 MCG (1000 UNIT) PO TABS
1000.0000 [IU] | ORAL_TABLET | Freq: Two times a day (BID) | ORAL | Status: DC
  Administered 2013-03-08 – 2013-03-09 (×2): 1000 [IU] via ORAL
  Filled 2013-03-08 (×3): qty 1

## 2013-03-08 MED ORDER — HYDROXYCHLOROQUINE SULFATE 200 MG PO TABS
200.0000 mg | ORAL_TABLET | Freq: Two times a day (BID) | ORAL | Status: DC
  Administered 2013-03-08 – 2013-03-09 (×2): 200 mg via ORAL
  Filled 2013-03-08 (×3): qty 1

## 2013-03-08 MED ORDER — SIMVASTATIN 20 MG PO TABS
20.0000 mg | ORAL_TABLET | Freq: Every day | ORAL | Status: DC
  Administered 2013-03-08: 20 mg via ORAL
  Filled 2013-03-08 (×2): qty 1

## 2013-03-08 MED ORDER — WARFARIN - PHARMACIST DOSING INPATIENT: Freq: Every day | Status: DC

## 2013-03-08 MED ORDER — MOMETASONE FURO-FORMOTEROL FUM 100-5 MCG/ACT IN AERO
2.0000 | INHALATION_SPRAY | Freq: Two times a day (BID) | RESPIRATORY_TRACT | Status: DC
  Administered 2013-03-08 – 2013-03-09 (×2): 2 via RESPIRATORY_TRACT
  Filled 2013-03-08: qty 8.8

## 2013-03-08 MED ORDER — WARFARIN SODIUM 7.5 MG PO TABS
7.5000 mg | ORAL_TABLET | ORAL | Status: DC
  Administered 2013-03-08: 7.5 mg via ORAL
  Filled 2013-03-08 (×3): qty 1

## 2013-03-08 MED ORDER — ASPIRIN 300 MG RE SUPP
300.0000 mg | RECTAL | Status: DC
Start: 2013-03-08 — End: 2013-03-09

## 2013-03-08 MED ORDER — ACETAMINOPHEN 325 MG PO TABS: 650.0000 mg | ORAL_TABLET | ORAL | Status: DC | PRN

## 2013-03-08 MED ORDER — ADULT MULTIVITAMIN W/MINERALS CH
1.0000 | ORAL_TABLET | Freq: Every day | ORAL | Status: DC
  Administered 2013-03-08 – 2013-03-09 (×2): 1 via ORAL
  Filled 2013-03-08 (×2): qty 1

## 2013-03-08 MED ORDER — RAMIPRIL 5 MG PO CAPS
5.0000 mg | ORAL_CAPSULE | Freq: Every day | ORAL | Status: DC
  Administered 2013-03-08: 5 mg via ORAL
  Filled 2013-03-08 (×2): qty 1

## 2013-03-08 MED ORDER — ASPIRIN EC 81 MG PO TBEC
81.0000 mg | DELAYED_RELEASE_TABLET | Freq: Every day | ORAL | Status: DC
  Administered 2013-03-09: 81 mg via ORAL
  Filled 2013-03-08: qty 1

## 2013-03-08 NOTE — ED Notes (Signed)
Received call back from Cardiology, states to give Aspirin 243mg 

## 2013-03-08 NOTE — ED Provider Notes (Signed)
CSN: [id number]     Arrival date & time 03/08/13  1125 History   First MD Initiated Contact with Patient 03/08/13 1139     Chief Complaint  Patient presents with  . Dizziness   (Consider location/radiation/quality/duration/timing/severity/associated sxs/prior Treatment) HPI Comments: Patient presents with dizziness and headache. She was in the emergency department 2 days ago after she had an episode of bradycardia with heart rate down in the 30s. She also had a short episode of asystole in the ED. She had an emergent pacemaker placement at that time. She was discharged from the hospital yesterday. She states that she started having a headache yesterday and it got worse today. She did take some Tylenol and her headaches completely gone now. She denies any neck pain or stiffness. She denies he fevers or chills. She states her head feels fuzzy and has some lightheadedness. She denies any chest pain other than the soreness from the pacemaker placement. She denies any shortness of breath. She denies he fevers or chills. She denies any cough or chest congestion. There's no nausea vomiting or diarrhea. She denies any urinary symptoms. She is on Coumadin due to a heart valve replacement.   Past Medical History  Diagnosis Date  . Asthma   . Lupus (systemic lupus erythematosus)   . PVD (peripheral vascular disease)     60% R renal artery stenosis; non-functioning L kidney  . Hypertension   . Dyslipidemia   . Chronic anticoagulation   . History of echocardiogram 11/08/2011    EF >55%; mild-mod concentric LVH; trace aortic regurg.; LA mild-mod dilated  . History of nuclear stress test 01/20/2010    normal pattern of perfusion; low risk scan  . H/O Doppler ultrasound 10/03/2010    carotid duplex - R ICA normal patency; L CCA-ICA bypass gradt patent common to interal cartoid bypass graft   . H/O mechanical aortic valve replacement   . Complete heart block 1//2015    Implantation of a dual chamber  pacemaker on 03-06-2013 by Dr Rayann Heman.  The patient received a STJ model number [id number] pacemaker with model number 2088 right ventricular lead.    Past Surgical History  Procedure Laterality Date  . Aortic valve replacement  11/29/006    [facility]; St. Jude; on coumadin  . Exploratory laparotomy  04/12/2012    small bowel perf; ileostomy & R hemicolectomy   . Tubal ligation  11/1982  . Cholecystectomy  1999  . Cardiac catheterization  11/01/2004    define anatomy   . Carotid endarterectomy      lt.  . Pacemaker insertion  03/06/2013    Dual chamber pacemaker implanted by Dr Rayann Heman for complete heart block   Family History  Problem Relation Age of Onset  . Alzheimer's disease Father   . Cirrhosis Father   . Cancer Father     prostate   History  Substance Use Topics  . Smoking status: Never Smoker   . Smokeless tobacco: Never Used  . Alcohol Use: No   OB History   Grav Para Term Preterm Abortions TAB SAB Ect Mult Living                 Review of Systems  Constitutional: Positive for fatigue. Negative for fever, chills and diaphoresis.  HENT: Negative for congestion, rhinorrhea and sneezing.   Eyes: Negative.   Respiratory: Negative for cough, chest tightness and shortness of breath.   Cardiovascular: Negative for chest pain and leg swelling.  Gastrointestinal: Negative for nausea, vomiting,  abdominal pain, diarrhea and blood in stool.  Genitourinary: Negative for frequency, hematuria, flank pain and difficulty urinating.  Musculoskeletal: Negative for arthralgias and back pain.  Skin: Negative for rash.  Neurological: Positive for light-headedness and headaches. Negative for dizziness, speech difficulty, weakness and numbness.    Allergies  Review of patient's allergies indicates no known allergies.  Home Medications   Current Outpatient Rx  Name  Route  Sig  Dispense  Refill  . acetaminophen (TYLENOL) 325 MG tablet   Oral   Take 1-2 tablets (325-650 mg total) by mouth  every 4 (four) hours as needed for mild pain.         [name] aspirin EC 81 MG EC tablet   Oral   Take 1 tablet (81 mg total) by mouth daily.         . Calcium Carbonate (CALTRATE 600) 1500 MG TABS   Oral   Take 1 tablet by mouth 2 (two) times daily.           . cholecalciferol (VITAMIN D) 1000 UNITS tablet   Oral   Take 1,000 Units by mouth 2 (two) times daily.         . Fluticasone-Salmeterol (ADVAIR DISKUS) 250-50 MCG/DOSE AEPB   Inhalation   Inhale 1 puff into the lungs 2 (two) times daily.   180 each   1   . furosemide (LASIX) 80 MG tablet   Oral   Take 80 mg by mouth daily.           . hydroxychloroquine (PLAQUENIL) 200 MG tablet   Oral   Take 1 tablet (200 mg total) by mouth 2 (two) times daily.   60 tablet   11   . loratadine (CLARITIN) 10 MG tablet   Oral   Take 10 mg by mouth daily.         . Multiple Vitamins-Minerals (CENTRUM SILVER PO)   Oral   Take 1 tablet by mouth daily.           . predniSONE (DELTASONE) 5 MG tablet   Oral   Take 5 mg by mouth daily.           . ramipril (ALTACE) 5 MG capsule   Oral   Take 1 capsule (5 mg total) by mouth daily.   90 capsule   1   . simvastatin (ZOCOR) 20 MG tablet   Oral   Take 1 tablet (20 mg total) by mouth at bedtime.   90 tablet   1   . warfarin (COUMADIN) 5 MG tablet   Oral   Take 7.5-10 mg by mouth daily. On Wednesday take 10mg . All other days take 7.5mg           BP 139/69  Pulse 73  Temp(Src) 97.5 F (36.4 C) (Oral)  Resp 19  SpO2 96% Physical Exam  Constitutional: She is oriented to person, place, and time. She appears well-developed and well-nourished.  HENT:  Head: Normocephalic and atraumatic.  Eyes: Pupils are equal, round, and reactive to light.  Neck: Normal range of motion. Neck supple.  Cardiovascular: Normal rate, regular rhythm and normal heart sounds.   Pulmonary/Chest: Effort normal and breath sounds normal. No respiratory distress. She has no wheezes. She has no  rales. She exhibits tenderness.  There is a dressing over  the pacemaker implantation site in the left anterior chest wall. There some ecchymosis around the area. No drainage or signs of infection.  Abdominal: Soft. Bowel sounds are normal. There is  no tenderness. There is no rebound and no guarding.  Musculoskeletal: Normal range of motion. She exhibits no edema.  Lymphadenopathy:    She has no cervical adenopathy.  Neurological: She is alert and oriented to person, place, and time.  Motor 5 out of 5 all extremities. Sensation grossly intact to light touch all extremities. No facial drooping or aphasia.  Skin: Skin is warm and dry. No rash noted.  Psychiatric: She has a normal mood and affect.    ED Course  Procedures (including critical care time) Labs Review Results for orders placed during the hospital encounter of 03/08/13  URINALYSIS, ROUTINE W REFLEX MICROSCOPIC      Result Value Range   Color, Urine YELLOW  YELLOW   APPearance CLEAR  CLEAR   Specific Gravity, Urine 1.030  1.005 - 1.030   pH 5.0  5.0 - 8.0   Glucose, UA NEGATIVE  NEGATIVE mg/dL   Hgb urine dipstick NEGATIVE  NEGATIVE   Bilirubin Urine NEGATIVE  NEGATIVE   Ketones, ur NEGATIVE  NEGATIVE mg/dL   Protein, ur 30 (*) NEGATIVE mg/dL   Urobilinogen, UA 0.2  0.0 - 1.0 mg/dL   Nitrite NEGATIVE  NEGATIVE   Leukocytes, UA NEGATIVE  NEGATIVE  PROTIME-INR      Result Value Range   Prothrombin Time 26.9 (*) 11.6 - 15.2 seconds   INR 2.59 (*) 0.00 - 1.49  URINE MICROSCOPIC-ADD ON      Result Value Range   Squamous Epithelial / LPF RARE  RARE   WBC, UA 0-2  <3 WBC/hpf   Casts HYALINE CASTS (*) NEGATIVE   Urine-Other MUCOUS PRESENT    POCT I-STAT, CHEM 8      Result Value Range   Sodium 142  137 - 147 mEq/L   Potassium 4.4  3.7 - 5.3 mEq/L   Chloride 106  96 - 112 mEq/L   BUN 21  6 - 23 mg/dL   Creatinine, Ser 1.30 (*) 0.50 - 1.10 mg/dL   Glucose, Bld 97  70 - 99 mg/dL   Calcium, Ion 1.20  1.13 - 1.30 mmol/L    TCO2 26  0 - 100 mmol/L   Hemoglobin 12.2  12.0 - 15.0 g/dL   HCT 36.0  36.0 - 46.0 %   Dg Chest 2 View  03/07/2013   CLINICAL DATA:  Status post pacemaker insertion  EXAM: CHEST  2 VIEW  COMPARISON:  Portable chest x-ray of 06 March 2013  FINDINGS: The patient has undergone an interim placement of a permanent pacemaker via the left subclavian approach. There is no evidence of a pneumothorax or hemothorax. The lungs are well-expanded. The pulmonary interstitial markings are mildly prominent though stable.The cardiopericardial silhouette is top-normal in size though stable. The patient has undergone previous median sternotomy and aortic valve replacement.  IMPRESSION: There is no evidence of postprocedure complication following placement of the permanent pacemaker.   Electronically Signed   By: David  [name]   On: 03/07/2013 08:48   Ct Head Wo Contrast  03/08/2013   CLINICAL DATA:  Dizziness, "does not feel right since placement of permanent pacemaker 2 days ago"  EXAM: CT HEAD WITHOUT CONTRAST  TECHNIQUE: Contiguous axial images were obtained from the base of the skull through the vertex without intravenous contrast.  COMPARISON:  None.  FINDINGS: The ventricles are normal in size and position. There is no intracranial hemorrhage nor intracranial mass effect. There is no evidence of an evolving ischemic infarction. There are no abnormal intracranial calcifications.  The cerebellum and brainstem are normal in density.  At bone window settings the observed portions of the paranasal sinuses and mastoid air cells are clear. There is no evidence of an acute skull fracture.  IMPRESSION: 1. There is no evidence of an acute ischemic or hemorrhagic event. 2. There is no intracranial mass effect or hydrocephalus. 3. No acute abnormality of the calvarium is demonstrated.   Electronically Signed   By: David  [name]   On: 03/08/2013 12:47   Dg Chest Portable 1 View  03/06/2013   CLINICAL DATA:  Short of breath.  Loss  of consciousness.  EXAM: PORTABLE CHEST - 1 VIEW  COMPARISON:  02/22/2005  FINDINGS: Extensive artifact overlies chest. There has been previous median sternotomy. Heart size appears normal. There is a poor inspiratory effort. There could be venous hypertension with early interstitial edema. No effusions. No acute bony finding.  IMPRESSION: Previous median sternotomy. Poor inspiration. Cannot rule out venous hypertension with mild interstitial edema.   Electronically Signed   By: Nelson Chimes M.D.   On: 03/06/2013 11:19    Imaging Review Dg Chest 2 View  03/07/2013   CLINICAL DATA:  Status post pacemaker insertion  EXAM: CHEST  2 VIEW  COMPARISON:  Portable chest x-ray of 06 March 2013  FINDINGS: The patient has undergone an interim placement of a permanent pacemaker via the left subclavian approach. There is no evidence of a pneumothorax or hemothorax. The lungs are well-expanded. The pulmonary interstitial markings are mildly prominent though stable.The cardiopericardial silhouette is top-normal in size though stable. The patient has undergone previous median sternotomy and aortic valve replacement.  IMPRESSION: There is no evidence of postprocedure complication following placement of the permanent pacemaker.   Electronically Signed   By: David  [name]   On: 03/07/2013 08:48   Ct Head Wo Contrast  03/08/2013   CLINICAL DATA:  Dizziness, "does not feel right since placement of permanent pacemaker 2 days ago"  EXAM: CT HEAD WITHOUT CONTRAST  TECHNIQUE: Contiguous axial images were obtained from the base of the skull through the vertex without intravenous contrast.  COMPARISON:  None.  FINDINGS: The ventricles are normal in size and position. There is no intracranial hemorrhage nor intracranial mass effect. There is no evidence of an evolving ischemic infarction. There are no abnormal intracranial calcifications. The cerebellum and brainstem are normal in density.  At bone window settings the observed  portions of the paranasal sinuses and mastoid air cells are clear. There is no evidence of an acute skull fracture.  IMPRESSION: 1. There is no evidence of an acute ischemic or hemorrhagic event. 2. There is no intracranial mass effect or hydrocephalus. 3. No acute abnormality of the calvarium is demonstrated.   Electronically Signed   By: David  [name]   On: 03/08/2013 12:47    EKG Interpretation    Date/Time:  Sunday March 08 2013 11:32:13 EST Ventricular Rate:  159 PR Interval:  112 QRS Duration: 122 QT Interval:  118 QTC Calculation: 191 R Axis:   -91 Text Interpretation:  VENTRICULAR PACED RHYTHM Possible Right ventricular hypertrophy Inferior infarct , age undetermined Anterolateral infarct , age undetermined Abnormal ECG Confirmed by Dene Landsberg  MD, Gurpreet Mariani ([id number]) on 03/08/2013 1:40:21 PM            MDM   1. Dizziness    Pt s/p pacemaker placement for symptomatic bradycardia/complete heart block presents with lightheadedness.  No signs of infection.  CT head okay.  No neuro deficits.  Cardiology has  seen and will admit for obs.  Pt feels better after some IV fluids.    Malvin Johns, MD 03/08/13 [phone]

## 2013-03-08 NOTE — H&P (Signed)
CARDIOLOGY HISTORY AND PHYSICAL   Patient ID: CHAMPAGNE FAUDREE MRN: [id number]  DOB/AGE: December 03, 1950 63 y.o. Admit date: 03/08/2013  Primary Care Physician: Jearld Lesch, DO Primary Cardiologist: Quay Burow Primary EP: Thompson Grayer MD  Clinical Summary Ms. Zeidan is a 63 y.o.female with multiple medical problems to include severe aortic valve insufficiency, status post ST. Jude prosthetic AVR in September 2006, on chronic Coumadin therapy, hypertension, hyperlipidemia, lupus, with recent admission for complete heart block, with placement of St. Jude medical Assuroty. DR dual-chamber pacemaker placed on 03/06/2013.   The patient was discharged home on 03/07/2013 after implantation.. The patient stated that she went to bed around 11 PM last night, awoke around 2 AM, feeling kind of foggy and lightheaded. Began to have a headache. Went back to bed and got up this morning with similar feelings. Headache persisted. She denied any chest pain fever chills shortness of breath. She denies any significant pain over her pacemaker insertion site, bleeding, or signs of infection. She has been having some mild diarrhea. She was not started on any new medications prior to discharge.     Speaking with the family she has been having periods of fogginess and dizziness for several months. Symptoms are not similar to that which she experienced prior to pacemaker implantation which included significant  diaphoresis weakness and near syncope. As a result of her ongoing symptoms she returned to the ER for further evaluation.    She was found to have mild orthostatic hypotension, dropping from 146/62-133/66 from lying to standing. CT scan was negative for acute ischemic or hemorrhagic event. Chest x-ray was unremarkable. She was started on normal saline after a bolus, and remains on IV fluids at this time. She is beginning to feel some better. Review of labs demonstrate her creatinine is 1.30, slightly up from discharge at  1.23. She does not have evidence of anemia, with hemoglobin of 12.2 hematocrit 36.0. INR was 2.59.     No Known Allergies   Infusions  NS at 100 cc hour.   Past Medical History  Diagnosis Date  . Asthma   . Lupus (systemic lupus erythematosus)   . PVD (peripheral vascular disease)     60% R renal artery stenosis; non-functioning L kidney  . Hypertension   . Dyslipidemia   . Chronic anticoagulation   . History of echocardiogram 11/08/2011    EF >55%; mild-mod concentric LVH; trace aortic regurg.; LA mild-mod dilated  . History of nuclear stress test 01/20/2010    normal pattern of perfusion; low risk scan  . H/O Doppler ultrasound 10/03/2010    carotid duplex - R ICA normal patency; L CCA-ICA bypass gradt patent common to interal cartoid bypass graft   . H/O mechanical aortic valve replacement   . Complete heart block 1//2015    Implantation of a dual chamber pacemaker on 03-06-2013 by Dr Rayann Heman.  The patient received a STJ model number [id number] pacemaker with model number 2088 right ventricular lead.     Past Surgical History  Procedure Laterality Date  . Aortic valve replacement  11/29/006    [facility]; St. Jude; on coumadin  . Exploratory laparotomy  04/12/2012    small bowel perf; ileostomy & R hemicolectomy   . Tubal ligation  11/1982  . Cholecystectomy  1999  . Cardiac catheterization  11/01/2004    define anatomy   . Carotid endarterectomy      lt.  . Pacemaker insertion  03/06/2013    Dual chamber pacemaker implanted by  Dr Rayann Heman for complete heart block    Family History  Problem Relation Age of Onset  . Alzheimer's disease Father   . Cirrhosis Father   . Cancer Father     prostate    Social History Ms. Schepis reports that she has never smoked. She has never used smokeless tobacco. Ms. Dephillips reports that she does not drink alcohol.  Review of Systems Otherwise reviewed and negative except as outlined.  Physical Examination Temp:  [97.5 F (36.4 C)] 97.5 F  (36.4 C) (01/18 1131) Pulse Rate:  [71-81] 73 (01/18 1425) Resp:  [14-25] 19 (01/18 1425) BP: (133-153)/(61-81) 139/69 mmHg (01/18 1425) SpO2:  [96 %-100 %] 96 % (01/18 1425) No intake or output data in the 24 hours ending 03/08/13 1428  Gen: No acute distress. Lightheaded with position change. Obese HEENT: Conjunctiva and lids normal, oropharynx clear with moist mucosa. Neck: Supple, no elevated JVP or carotid bruits, no thyromegaly. Lungs: Clear to auscultation, nonlabored breathing at rest. Cardiac: Regular rate and rhythm, systolic click, crisp., no pericardial rub. Abdomen: Soft, nontender, no hepatomegaly, bowel sounds present, no guarding or rebound. Extremities: 1+ pretibial pitting edema, distal pulses 2+. Skin: Warm and dry. Musculoskeletal: No kyphosis.Pacemaker dressing over the left chest, without bleeding, or hematoma. Sore to touch. Did not remove dressing.  Neuropsychiatric: Alert and oriented x3, affect grossly appropriate.   Lab Results  Basic Metabolic Panel:  Recent Labs Lab 03/06/13 1032 03/06/13 1043 03/06/13 1705 03/07/13 0338 03/08/13 1316  NA 138 140  --  144 142  K 4.8 4.7  --  5.1 4.4  CL 103 106  --  110 106  CO2 22  --   --  24  --   GLUCOSE 131* 136*  --  105* 97  BUN 25* 31*  --  23 21  CREATININE 1.42* 1.60*  --  1.23* 1.30*  CALCIUM 8.8  --   --  9.0  --   MG  --   --  2.0  --   --     Liver Function Tests:  Recent Labs Lab 03/06/13 1032  AST 49*  ALT 46*  ALKPHOS 110  BILITOT 0.2*  PROT 7.1  ALBUMIN 3.2*    CBC:  Recent Labs Lab 03/06/13 1032 03/06/13 1043 03/07/13 0338 03/08/13 1316  WBC 8.4  --  6.0  --   NEUTROABS 5.6  --   --   --   HGB 12.0 12.9 10.1* 12.2  HCT 37.1 38.0 31.6* 36.0  MCV 84.9  --  86.3  --   PLT 249  --  237  --      Radiology Dg Chest 2 View  03/07/2013   CLINICAL DATA:  Status post pacemaker insertion  EXAM: CHEST  2 VIEW  COMPARISON:  Portable chest x-ray of 06 March 2013  FINDINGS:  The patient has undergone an interim placement of a permanent pacemaker via the left subclavian approach. There is no evidence of a pneumothorax or hemothorax. The lungs are well-expanded. The pulmonary interstitial markings are mildly prominent though stable.The cardiopericardial silhouette is top-normal in size though stable. The patient has undergone previous median sternotomy and aortic valve replacement.  IMPRESSION: There is no evidence of postprocedure complication following placement of the permanent pacemaker.   Electronically Signed   By: David  [name]   On: 03/07/2013 08:48   Ct Head Wo Contrast  03/08/2013   CLINICAL DATA:  Dizziness, "does not feel right since placement of permanent pacemaker 2  days ago"  EXAM: CT HEAD WITHOUT CONTRAST  TECHNIQUE: Contiguous axial images were obtained from the base of the skull through the vertex without intravenous contrast.  COMPARISON:  None.  FINDINGS: The ventricles are normal in size and position. There is no intracranial hemorrhage nor intracranial mass effect. There is no evidence of an evolving ischemic infarction. There are no abnormal intracranial calcifications. The cerebellum and brainstem are normal in density.  At bone window settings the observed portions of the paranasal sinuses and mastoid air cells are clear. There is no evidence of an acute skull fracture.  IMPRESSION: 1. There is no evidence of an acute ischemic or hemorrhagic event. 2. There is no intracranial mass effect or hydrocephalus. 3. No acute abnormality of the calvarium is demonstrated.   Electronically Signed   By: David  [name]   On: 03/08/2013 12:47    Prior Cardiac Testing/Procedures:  1. Pacemaker insertion 1./16/2015-St Jude  2. Aortic valve replacement 08/03/2004 with a [employer] mechanical valve  model R500, size 23, serial [id number].    3. Cardiac Cath 01/21/2001 HEMODYNAMICS:  1. Aortic systolic pressure [id number], diastolic pressure 69.  2. Left ventricular systolic  pressure 0000000, diastolic pressure 29.  SELECTIVE CORONARY ANGIOGRAPHY:  1. Left main: Normal.  2. LAD: Normal.  3. Left circumflex: Normal.  4. Right coronary artery: Dominant and normal.  ECG Paced Rhythm  Impression and Recommendations  1. S/P Dual Chamber Pacemaker implantation: Symptoms of lightheadedness "foggy feeling" since returning home yesterday. The patient states that she has been having these episodes in the past, but since she has had the pacemaker implanted she was concerned that it was related to this. She had worse symptoms prior to pacemaker implantation.  She has had some mild orthostatic hypotension. She is feeling better with IV hydration. Creatinine is 1.2. There is no evidence of bleeding or infection. Pacemaker was interrogated prior to discharge yesterday. She may need to have further interrogation and adjustment of parameters. We will have EP see her in the a.m. and keep her in the hospital overnight for observation.  2. History of AoV Replacement: Completed in July 2006, on chronic anticoagulation. Patient's INR 0.59 with a PT of 26.9 today in ER. She denies any bleeding. Last echocardiogram completed in September of 2014 demonstrated that the prostethic aortic valve was functioning normally, with trivial regurg.  3. Renal artery stenosis: 60% right renal artery stenosis with a nonfunctioning left kidney.  4. History of Carotid Artery Stenosis: Intraoperative ultrasound demonstrating abnormal carotid duplex. With normal by pass graft patency. The patient had a left common to internal carotid bypass graft by history. Right ICA was without evidence of significant diameter reduction.  5. Questionable OSA: With body habitus and frequent waking at night, consideration for sleep study as OP is recommended.    Signed: Jory Sims 03/08/2013, 2:28 PM Co-Sign MD  History and all data above reviewed.  Patient examined.  I agree with the findings as above.  The patient  had a vague episode of not feeling well early this am while sitting on her couch.  She did not have syncope or even dizziness although she had some vague light headedness.  She was quite anxious about this and came back to the ER .  The patient exam reveals [id number], Chest:  Pacemaker wound OK  ,  Lungs: clear  ,  Abd: Positive bowel sounds, no rebound no guarding, Ext mild edema   .  All available labs, radiology testing, previous records reviewed. Agree  with documented assessment and plan. Post pacemaker:  The patient had vague symptoms and is anxious post pacemaker.  There are no objective findings on labs or EKG.  A head CT showed no acute process.  However, the patient would prefer to be observed.  We will place in observation.  Home in AM if OK.   Jeneen Rinks Anan Dapolito  3:40 PM  03/08/2013

## 2013-03-08 NOTE — ED Notes (Signed)
MD at bedside. 

## 2013-03-08 NOTE — ED Notes (Signed)
Aspirin held at this time per order for ASA contraindicated r/t Coumadin. Sheppard Penton RN notified. K. Lawrence NP paged for clarification.

## 2013-03-08 NOTE — Progress Notes (Signed)
ANTICOAGULATION CONSULT NOTE - Initial Consult  Pharmacy Consult for Coumadin Indication: mechanical valve  No Known Allergies  Patient Measurements:    Vital Signs: Temp: 97.5 F (36.4 C) (01/18 1131) Temp src: Oral (01/18 1131) BP: 148/68 mmHg (01/18 1530) Pulse Rate: 77 (01/18 1530)  Labs:  Recent Labs  03/06/13 1032 03/06/13 1043 03/07/13 0338 03/08/13 1256 03/08/13 1316  HGB 12.0 12.9 10.1*  --  12.2  HCT 37.1 38.0 31.6*  --  36.0  PLT 249  --  237  --   --   APTT 32  --   --   --   --   LABPROT 22.9*  --  27.0* 26.9*  --   INR 2.10*  --  2.61* 2.59*  --   CREATININE 1.42* 1.60* 1.23*  --  1.30*    The CrCl is unknown because both a height and weight (above a minimum accepted value) are required for this calculation.   Medications:  PTA regimen: Coumadin 10mg  on Wednesdays, 7.5mg  all other days; last dose 1/17.   Assessment: 63 year old female on chronic anticoagulation with Coumadin for mechanical AVR.  She was discharged from [facility] yesterday after getting a pacemaker placed on Friday.  She is in ED now with dizziness, fogginess and headache.   INR goal of 2.5-3.5 (confirmed with her outpatient Coumadin clinic records). Her INR is therapeutic at 2.59 and H/H WNL and no bleeding reported.  Goal of Therapy:  INR 2.5-3.5   Plan:  Continue home dose of 7.5 mg daily and 10 mg on Wednesdays Daily PT/INR Eudelia Bunch, Pharm.D. [id number] 03/08/2013 3:50 PM

## 2013-03-08 NOTE — ED Notes (Signed)
Pt reports that she had a pacemaker placed on Friday, reports that she had not felt right since having it placed. Reports that she was dc'ed home yesterday but just doesn't feel right. Denies any pain, but reports she just feels dizzy. Pt still has sling on the left arm.

## 2013-03-09 DIAGNOSIS — I951 Orthostatic hypotension: Secondary | ICD-10-CM | POA: Diagnosis present

## 2013-03-09 LAB — BASIC METABOLIC PANEL
BUN: 19 mg/dL (ref 6–23)
CALCIUM: 8.8 mg/dL (ref 8.4–10.5)
CO2: 24 mEq/L (ref 19–32)
Chloride: 107 mEq/L (ref 96–112)
Creatinine, Ser: 1.01 mg/dL (ref 0.50–1.10)
GFR calc Af Amer: 69 mL/min — ABNORMAL LOW (ref >90)
GFR, EST NON AFRICAN AMERICAN: 59 mL/min — AB (ref >90)
GLUCOSE: 101 mg/dL — AB (ref 70–99)
Potassium: 4.5 mEq/L (ref 3.7–5.3)
Sodium: 141 mEq/L (ref 137–147)

## 2013-03-09 LAB — PROTIME-INR
INR: 2.77 — ABNORMAL HIGH (ref 0.00–1.49)
PROTHROMBIN TIME: 28.3 s — AB (ref 11.6–15.2)

## 2013-03-09 MED ORDER — FUROSEMIDE 40 MG PO TABS: 40.0000 mg | ORAL_TABLET | Freq: Every day | ORAL | Status: DC

## 2013-03-09 NOTE — Progress Notes (Signed)
TELEMETRY: Reviewed telemetry pt in NSR with intermittent pacing: Filed Vitals:   03/08/13 2000 03/08/13 2055 03/09/13 0000 03/09/13 0400  BP: 96/44  111/47 108/45  Pulse: 81  74 68  Temp: 98.3 F (36.8 C)  98.5 F (36.9 C) 98.7 F (37.1 C)  TempSrc: Oral  Oral   Resp: 16  16 15   Height:      Weight:      SpO2: 97% 97% 98% 100%    Intake/Output Summary (Last 24 hours) at 03/09/13 0816 Last data filed at 03/09/13 0600  Gross per 24 hour  Intake    930 ml  Output      0 ml  Net    930 ml    SUBJECTIVE Feels very well this am. No dizziness, HA, lightheadedness.   LABS: Basic Metabolic Panel:  Recent Labs  03/06/13 1043 03/06/13 1705 03/07/13 0338 03/08/13 1316 03/09/13 0447  NA 140  --  144 142 141  K 4.7  --  5.1 4.4 4.5  CL 106  --  110 106 107  CO2  --   --  24  --  24  GLUCOSE 136*  --  105* 97 101*  BUN 31*  --  23 21 19   CREATININE 1.60*  --  1.23* 1.30* 1.01  CALCIUM  --   --  9.0  --  8.8  MG  --  2.0  --   --   --    Liver Function Tests:  Recent Labs  03/06/13 1032  AST 49*  ALT 46*  ALKPHOS 110  BILITOT 0.2*  PROT 7.1  ALBUMIN 3.2*   No results found for this basename: LIPASE, AMYLASE,  in the last 72 hours CBC:  Recent Labs  03/06/13 1032  03/07/13 0338 03/08/13 1316  WBC 8.4  --  6.0  --   NEUTROABS 5.6  --   --   --   HGB 12.0  < > 10.1* 12.2  HCT 37.1  < > 31.6* 36.0  MCV 84.9  --  86.3  --   PLT 249  --  237  --   < > = values in this interval not displayed. Cardiac Enzymes: No results found for this basename: CKTOTAL, CKMB, CKMBINDEX, TROPONINI,  in the last 72 hours BNP: No components found with this basename: POCBNP,  D-Dimer: No results found for this basename: DDIMER,  in the last 72 hours Hemoglobin A1C: No results found for this basename: HGBA1C,  in the last 72 hours Fasting Lipid Panel: No results found for this basename: CHOL, HDL, LDLCALC, TRIG, CHOLHDL, LDLDIRECT,  in the last 72 hours Thyroid Function  Tests:  Recent Labs  03/06/13 1705  TSH 2.666   Anemia Panel: No results found for this basename: VITAMINB12, FOLATE, FERRITIN, TIBC, IRON, RETICCTPCT,  in the last 72 hours  Radiology/Studies:  Dg Chest 2 View  03/07/2013   CLINICAL DATA:  Status post pacemaker insertion  EXAM: CHEST  2 VIEW  COMPARISON:  Portable chest x-ray of 06 March 2013  FINDINGS: The patient has undergone an interim placement of a permanent pacemaker via the left subclavian approach. There is no evidence of a pneumothorax or hemothorax. The lungs are well-expanded. The pulmonary interstitial markings are mildly prominent though stable.The cardiopericardial silhouette is top-normal in size though stable. The patient has undergone previous median sternotomy and aortic valve replacement.  IMPRESSION: There is no evidence of postprocedure complication following placement of the permanent pacemaker.   Electronically  Signed   By: David  [name]   On: 03/07/2013 08:48   Ct Head Wo Contrast  03/08/2013   CLINICAL DATA:  Dizziness, "does not feel right since placement of permanent pacemaker 2 days ago"  EXAM: CT HEAD WITHOUT CONTRAST  TECHNIQUE: Contiguous axial images were obtained from the base of the skull through the vertex without intravenous contrast.  COMPARISON:  None.  FINDINGS: The ventricles are normal in size and position. There is no intracranial hemorrhage nor intracranial mass effect. There is no evidence of an evolving ischemic infarction. There are no abnormal intracranial calcifications. The cerebellum and brainstem are normal in density.  At bone window settings the observed portions of the paranasal sinuses and mastoid air cells are clear. There is no evidence of an acute skull fracture.  IMPRESSION: 1. There is no evidence of an acute ischemic or hemorrhagic event. 2. There is no intracranial mass effect or hydrocephalus. 3. No acute abnormality of the calvarium is demonstrated.   Electronically Signed   By: David   [name]   On: 03/08/2013 12:47   Dg Chest Portable 1 View  03/06/2013   CLINICAL DATA:  Short of breath.  Loss of consciousness.  EXAM: PORTABLE CHEST - 1 VIEW  COMPARISON:  02/22/2005  FINDINGS: Extensive artifact overlies chest. There has been previous median sternotomy. Heart size appears normal. There is a poor inspiratory effort. There could be venous hypertension with early interstitial edema. No effusions. No acute bony finding.  IMPRESSION: Previous median sternotomy. Poor inspiration. Cannot rule out venous hypertension with mild interstitial edema.   Electronically Signed   By: Nelson Chimes M.D.   On: 03/06/2013 11:19   Ecg: 03/08/13 V paced.  PHYSICAL EXAM General: Well developed, obese, in no acute distress. Head: Normocephalic, atraumatic, sclera non-icteric, no xanthomas, nares are without discharge. Neck: Negative for carotid bruits. JVD not elevated. Lungs: Clear bilaterally to auscultation without wheezes, rales, or rhonchi. Breathing is unlabored. Heart: RRR normal S1 S2 with systolic click, without murmurs, rubs, or gallops. Pacer site looks like it is healing well without drainage or swelling. Abdomen: Soft, non-tender, non-distended with normoactive bowel sounds. No hepatomegaly. obese. No obvious abdominal masses. Msk:  Strength and tone appears normal for age. Extremities: 1+ edema.  Distal pedal pulses are 2+ and equal bilaterally. Neuro: Alert and oriented X 3. Moves all extremities spontaneously. Psych:  Responds to questions appropriately with a normal affect.  ASSESSMENT AND PLAN: 1. Lightheadedness and HA. Most likely due to orthostatic hypotension. Head CT negative. Improved with hydration. BP still relatively low. Will hold Altace. Reduce lasix to 40 mg daily. She will probably need some diuretic given chronic edema. Stable for DC today. Keep follow up as previously scheduled. 2. S/p DDD pacemaker. Keep scheduled follow up in pacer clinic. 3. S/p AVR on chronic  anticoagulation with coumadin. INR therapeutic.  4. History of carotid arterial disease s/p left common to internal BPG.  5. History of RAS 60% on the left. Nonfunctioning right kidney. 6. SLE on chronic steroids. 7. HTN  Active Problems:   Asthma, mild intermittent   LUPUS (SLE)   AORTIC VALVE REPLACEMENT, HX OF, 2006 with mechanical valve   Long term (current) use of anticoagulants   Hyperlipidemia   Anticoagulated on Coumadin for mechanical valve   Complete heart block   Dizziness   Dizziness of unknown cause    Signed, Peter [name] MD,FACC 03/09/2013 8:25 AM

## 2013-03-09 NOTE — Discharge Summary (Signed)
CARDIOLOGY DISCHARGE SUMMARY   Patient ID: [name] MRN: [id number] DOB/AGE: [birth year] 63 y.o.  Admit date: 03/08/2013 Discharge date: 03/09/2013  PCP: Jearld Lesch, DO Primary Cardiologist: Berry/Allred  Primary Discharge Diagnosis:  Orthostatic hypotension   Secondary Discharge Diagnosis:    Asthma, mild intermittent   LUPUS (SLE)   AORTIC VALVE REPLACEMENT, HX OF, 2006 with mechanical valve   Long term (current) use of anticoagulants   Hyperlipidemia   Anticoagulated on Coumadin for mechanical valve   Complete heart block   Dizziness   Dizziness of unknown cause  Procedures:  CT head w/o contrast  Hospital Course: [name] is a 63 y.o. female with a history of severe aortic valve insufficiency, status post ST. Jude prosthetic AVR in September 2006, on chronic Coumadin therapy, hypertension, hyperlipidemia, lupus, with recent admission for complete heart block, with placement of St. Jude medical Assurity DR dual-chamber pacemaker placed on 03/06/2013. She was discharged on 03/07/2013.  At 2am on 01/18, she woke feeling light-headed and developed a headache. Symptoms persisted, so she came to the ER on 01/18 and was admitted for further evaluation and treatment.  She had no anemia. Her BUN and Cr were higher than usual and she was hydrated. Her BUN and Cr normalized. There were no signs/symptoms of infection.   On 01/19, she was seen by Dr. [name] and all data were reviewed. She was no longer hypotensive although her BP was running a little low so some medication changes were made, see d/c med list below. There was no problem with her pacemaker and her symptoms have resolved.  He felt no further inpatient workup was indicated and she is considered stable for discharge, to follow up as an outpatient. She will see the PA when she comes for her pacer/wound check to make sure her volume status is OK and she is otherwise doing well.   Labs:   Lab Results  Component  Value Date   WBC 6.0 03/07/2013   HGB 12.2 03/08/2013   HCT 36.0 03/08/2013   MCV 86.3 03/07/2013   PLT 237 03/07/2013    Recent Labs Lab 03/06/13 1032  03/09/13 0447  NA 138  < > 141  K 4.8  < > 4.5  CL 103  < > 107  CO2 22  < > 24  BUN 25*  < > 19  CREATININE 1.42*  < > 1.01  CALCIUM 8.8  < > 8.8  PROT 7.1  --   --   BILITOT 0.2*  --   --   ALKPHOS 110  --   --   ALT 46*  --   --   AST 49*  --   --   GLUCOSE 131*  < > 101*  < > = values in this interval not displayed.  Recent Labs  03/09/13 0447  INR 2.77*      Radiology:  Ct Head Wo Contrast 03/08/2013   CLINICAL DATA:  Dizziness, "does not feel right since placement of permanent pacemaker 2 days ago"  EXAM: CT HEAD WITHOUT CONTRAST  TECHNIQUE: Contiguous axial images were obtained from the base of the skull through the vertex without intravenous contrast.  COMPARISON:  None.  FINDINGS: The ventricles are normal in size and position. There is no intracranial hemorrhage nor intracranial mass effect. There is no evidence of an evolving ischemic infarction. There are no abnormal intracranial calcifications. The cerebellum and brainstem are normal in density.  At bone window settings the observed  portions of the paranasal sinuses and mastoid air cells are clear. There is no evidence of an acute skull fracture.  IMPRESSION: 1. There is no evidence of an acute ischemic or hemorrhagic event. 2. There is no intracranial mass effect or hydrocephalus. 3. No acute abnormality of the calvarium is demonstrated.   Electronically Signed   By: David  [name]   On: 03/08/2013 12:47   EKG:  SR, Vpacing  FOLLOW UP PLANS AND APPOINTMENTS No Known Allergies   Medication List    STOP taking these medications       ramipril 5 MG capsule  Commonly known as:  ALTACE      TAKE these medications       acetaminophen 325 MG tablet  Commonly known as:  TYLENOL  Take 1-2 tablets (325-650 mg total) by mouth every 4 (four) hours as needed for mild  pain.     aspirin 81 MG EC tablet  Take 1 tablet (81 mg total) by mouth daily.     CALTRATE 600 1500 MG Tabs  Generic drug:  Calcium Carbonate  Take 1 tablet by mouth 2 (two) times daily.     CENTRUM SILVER PO  Take 1 tablet by mouth daily.     cholecalciferol 1000 UNITS tablet  Commonly known as:  VITAMIN D  Take 1,000 Units by mouth 2 (two) times daily.     Fluticasone-Salmeterol 250-50 MCG/DOSE Aepb  Commonly known as:  ADVAIR DISKUS  Inhale 1 puff into the lungs 2 (two) times daily.     furosemide 40 MG tablet  Commonly known as:  LASIX  Take 1 tablet (40 mg total) by mouth daily.     hydroxychloroquine 200 MG tablet  Commonly known as:  PLAQUENIL  Take 1 tablet (200 mg total) by mouth 2 (two) times daily.     loratadine 10 MG tablet  Commonly known as:  CLARITIN  Take 10 mg by mouth daily.     predniSONE 5 MG tablet  Commonly known as:  DELTASONE  Take 5 mg by mouth daily.     simvastatin 20 MG tablet  Commonly known as:  ZOCOR  Take 1 tablet (20 mg total) by mouth at bedtime.     warfarin 5 MG tablet  Commonly known as:  COUMADIN  Take 7.5-10 mg by mouth daily. On Wednesday take 10mg . All other days take 7.5mg         Discharge Orders   Future Appointments Provider Department Dept Phone   03/16/2013 2:15 PM [facility] [place] [facility] [phone]   03/16/2013 3:30 PM [facility] [place] [facility] [phone]   04/22/2013 2:45 PM [name], MD [facility] [phone]   11/05/2013 1:30 PM Deneise Lever, MD [facility] [phone]   Future Orders Complete By Expires   Diet - low sodium heart healthy  As directed    Increase activity slowly  As directed      Follow-up Information   Follow up with [facility] On 03/16/2013. (Pacemaker and wound check at 3:30 pm)    Contact information:   [place] [id number]       Follow up with Richardson Dopp, PA-C On 03/16/2013. (See for Dr. Gwenlyn Found at 2:15 pm)    Specialty:  Physician Assistant   Contact information:   [id number] [place] [address] [phone]       BRING ALL  MEDICATIONS WITH YOU TO FOLLOW UP APPOINTMENTS  Time spent with patient to include physician time: 33 min Signed: Rosaria Ferries, PA-C 03/09/2013, 8:52 AM Co-Sign MD

## 2013-03-16 ENCOUNTER — Ambulatory Visit (INDEPENDENT_AMBULATORY_CARE_PROVIDER_SITE_OTHER): Payer: Medicare Other | Admitting: *Deleted

## 2013-03-16 ENCOUNTER — Telehealth: Payer: Self-pay | Admitting: Cardiovascular Disease

## 2013-03-16 ENCOUNTER — Ambulatory Visit (INDEPENDENT_AMBULATORY_CARE_PROVIDER_SITE_OTHER): Payer: Medicare Other | Admitting: Physician Assistant

## 2013-03-16 ENCOUNTER — Encounter: Payer: Self-pay | Admitting: Physician Assistant

## 2013-03-16 VITALS — BP 120/82 | HR 70 | Ht 64.4 in | Wt 251.0 lb

## 2013-03-16 DIAGNOSIS — Z950 Presence of cardiac pacemaker: Secondary | ICD-10-CM

## 2013-03-16 DIAGNOSIS — Z952 Presence of prosthetic heart valve: Secondary | ICD-10-CM

## 2013-03-16 DIAGNOSIS — Z954 Presence of other heart-valve replacement: Secondary | ICD-10-CM

## 2013-03-16 DIAGNOSIS — R001 Bradycardia, unspecified: Secondary | ICD-10-CM

## 2013-03-16 DIAGNOSIS — I701 Atherosclerosis of renal artery: Secondary | ICD-10-CM | POA: Insufficient documentation

## 2013-03-16 DIAGNOSIS — Z5181 Encounter for therapeutic drug level monitoring: Secondary | ICD-10-CM

## 2013-03-16 DIAGNOSIS — I6529 Occlusion and stenosis of unspecified carotid artery: Secondary | ICD-10-CM

## 2013-03-16 DIAGNOSIS — I498 Other specified cardiac arrhythmias: Secondary | ICD-10-CM

## 2013-03-16 DIAGNOSIS — Z7901 Long term (current) use of anticoagulants: Secondary | ICD-10-CM

## 2013-03-16 DIAGNOSIS — I442 Atrioventricular block, complete: Secondary | ICD-10-CM

## 2013-03-16 DIAGNOSIS — R609 Edema, unspecified: Secondary | ICD-10-CM

## 2013-03-16 DIAGNOSIS — I10 Essential (primary) hypertension: Secondary | ICD-10-CM

## 2013-03-16 LAB — MDC_IDC_ENUM_SESS_TYPE_INCLINIC
Battery Remaining Longevity: 144 mo
Battery Voltage: 3.13 V
Brady Statistic RA Percent Paced: 0.25 %
Date Time Interrogation Session: 20150126171335
Implantable Pulse Generator Model: 2240
Implantable Pulse Generator Serial Number: 7587004
Lead Channel Impedance Value: 475 Ohm
Lead Channel Pacing Threshold Amplitude: 1 V
Lead Channel Pacing Threshold Amplitude: 1.25 V
Lead Channel Pacing Threshold Amplitude: 1.25 V
Lead Channel Pacing Threshold Pulse Width: 0.4 ms
Lead Channel Pacing Threshold Pulse Width: 0.4 ms
Lead Channel Pacing Threshold Pulse Width: 0.4 ms
Lead Channel Sensing Intrinsic Amplitude: 4.2 mV
Lead Channel Setting Pacing Pulse Width: 0.4 ms
Lead Channel Setting Sensing Sensitivity: 2 mV
MDC IDC MSMT LEADCHNL RA PACING THRESHOLD PULSEWIDTH: 0.4 ms
MDC IDC MSMT LEADCHNL RV IMPEDANCE VALUE: 662.5 Ohm
MDC IDC MSMT LEADCHNL RV PACING THRESHOLD AMPLITUDE: 1 V
MDC IDC MSMT LEADCHNL RV SENSING INTR AMPL: 12 mV
MDC IDC SET LEADCHNL RA PACING AMPLITUDE: 3.5 V
MDC IDC SET LEADCHNL RV PACING AMPLITUDE: 3.5 V
MDC IDC STAT BRADY RV PERCENT PACED: 7.2 %

## 2013-03-16 LAB — BASIC METABOLIC PANEL
BUN: 22 mg/dL (ref 6–23)
CO2: 29 mEq/L (ref 19–32)
CREATININE: 1.1 mg/dL (ref 0.4–1.2)
Calcium: 9.4 mg/dL (ref 8.4–10.5)
Chloride: 102 mEq/L (ref 96–112)
GFR: 53.21 mL/min — AB (ref >60.00)
Glucose, Bld: 109 mg/dL — ABNORMAL HIGH (ref 70–99)
Potassium: 4 mEq/L (ref 3.5–5.1)
Sodium: 139 mEq/L (ref 135–145)

## 2013-03-16 LAB — POCT INR: INR: 2.9

## 2013-03-16 MED ORDER — FUROSEMIDE 40 MG PO TABS: 20.0000 mg | ORAL_TABLET | Freq: Every day | ORAL | Status: DC

## 2013-03-16 MED ORDER — FUROSEMIDE 20 MG PO TABS: 20.0000 mg | ORAL_TABLET | Freq: Every day | ORAL | Status: DC

## 2013-03-16 NOTE — Progress Notes (Signed)
Wound check appointment. Steri-strips removed. Wound without redness or edema. Incision edges approximated, wound well healed. Normal device function. Thresholds, sensing, and impedances consistent with implant measurements. Device programmed at 3.5V for extra safety margin until 3 month visit. Histogram distribution appropriate for patient and level of activity. No mode switches or high ventricular rates noted. Patient educated about wound care, arm mobility, lifting restrictions.   ROV w/ Dr. Rayann Heman in 1mo.

## 2013-03-16 NOTE — Progress Notes (Signed)
[address] [place], [place]  [address] Phone: [phone] Fax:  [phone]  Date:  03/16/2013   ID:  [name], DOB 08-31-1950, MRN [id number]  PCP:  Jearld Lesch DO  Cardiologist:  Dr. Quay Burow  Electrophysiologist:  Dr. Thompson Grayer    History of Present Illness: [name] is a 63 y.o. female with a history of aortic insufficiency, status post mechanical AVR in 2006 (coronary arteries were normal at the time of her catheterization in 2006), R renal artery stenosis (60%), carotid stenosis (s/p L CCA to ICA bypass), HTN, HL, lupus, prior small bowel obstruction with perforation status post hemicolectomy 03/2012.  Echo (10/2012): Mild LVH, EF 55-60%, mechanical AVR okay, mild MR, moderate RVE, moderate RAE, moderate TR.  Renal Artery [place] (11/2012):  </= 60% R RA stenosis.    She was recently admitted 1/16-1/17 with symptomatic complete heart block that progressed to asystole. She underwent emergent pacemaker implantation with a dual-chamber, St. Jude device. She was readmitted 1/18-1/19 with symptomatic orthostatic hypotension (lightheaded and HA).  She improved with hydration overnight.  Altace was held and her Lasix was reduced.  She was kept on Lasix for LE edema.  Head CT was negative.  Since discharge, she has been somewhat confused about whether or not she should be taking Altace. Records indicate that she was supposed to stop this medication. She has days where she does feel lightheaded. She thinks it is more related to anxiety than anything else. She denies chest pain, shortness of breath, syncope, orthopnea, PND or edema   Recent Labs: 09/02/2012: HDL Cholesterol 89; LDL (calc) 79  03/06/2013: ALT 46*; TSH 2.666  03/08/2013: Hemoglobin 12.2  03/09/2013: Creatinine 1.01; Potassium 4.5   Wt Readings from Last 3 Encounters:  03/16/13 251 lb (113.853 kg)  03/08/13 264 lb 8.8 oz (120 kg)  03/06/13 261 lb 14.5 oz (118.8 kg)     Past Medical History    Diagnosis Date  . Asthma   . Lupus (systemic lupus erythematosus)   . PVD (peripheral vascular disease)     60% R renal artery stenosis; non-functioning L kidney  . Hypertension   . Dyslipidemia   . Chronic anticoagulation   . History of echocardiogram 11/08/2011    EF >55%; mild-mod concentric LVH; trace aortic regurg.; LA mild-mod dilated  . History of nuclear stress test 01/20/2010    normal pattern of perfusion; low risk scan  . H/O Doppler ultrasound 10/03/2010    carotid duplex - R ICA normal patency; L CCA-ICA bypass gradt patent common to interal cartoid bypass graft   . H/O mechanical aortic valve replacement   . Complete heart block 1//2015    Implantation of a dual chamber pacemaker on 03-06-2013 by Dr Rayann Heman.  The patient received a STJ model number [id number] pacemaker with model number 2088 right ventricular lead.     Current Outpatient Prescriptions  Medication Sig Dispense Refill  . acetaminophen (TYLENOL) 325 MG tablet Take 1-2 tablets (325-650 mg total) by mouth every 4 (four) hours as needed for mild pain.      [name] aspirin EC 81 MG EC tablet Take 1 tablet (81 mg total) by mouth daily.      . Calcium Carbonate (CALTRATE 600) 1500 MG TABS Take 1 tablet by mouth 2 (two) times daily.        . cholecalciferol (VITAMIN D) 1000 UNITS tablet Take 1,000 Units by mouth 2 (two) times daily.      . Fluticasone-Salmeterol (  ADVAIR DISKUS) 250-50 MCG/DOSE AEPB Inhale 1 puff into the lungs 2 (two) times daily.  180 each  1  . furosemide (LASIX) 40 MG tablet Take 1 tablet (40 mg total) by mouth daily.  30 tablet  11  . hydroxychloroquine (PLAQUENIL) 200 MG tablet Take 1 tablet (200 mg total) by mouth 2 (two) times daily.  60 tablet  11  . loratadine (CLARITIN) 10 MG tablet Take 10 mg by mouth daily.      . Multiple Vitamins-Minerals (CENTRUM SILVER PO) Take 1 tablet by mouth daily.        . predniSONE (DELTASONE) 5 MG tablet Take 5 mg by mouth daily.        . simvastatin (ZOCOR) 20 MG tablet  Take 1 tablet (20 mg total) by mouth at bedtime.  90 tablet  1  . warfarin (COUMADIN) 5 MG tablet Take 7.5-10 mg by mouth daily. On Wednesday take 10mg . All other days take 7.5mg        No current facility-administered medications for this visit.    Allergies:   Review of patient's allergies indicates no known allergies.   Social History:  The patient  reports that she has never smoked. She has never used smokeless tobacco. She reports that she does not drink alcohol or use illicit drugs.   Family History:  The patient's family history includes Alzheimer's disease in her father; Cancer in her father; Cirrhosis in her father.   ROS:  Please see the history of present illness.   She has a non-productive cough.   All other systems reviewed and negative.   PHYSICAL EXAM: VS:  BP 120/82  Pulse 70  Ht 5' 4.4" (1.636 m)  Wt 251 lb (113.853 kg)  BMI 42.54 kg/m2 Well nourished, well developed, in no acute distress HEENT: normal Neck: no JVD Cardiac:  Normal S1, mechanical S2, 2/6 systolic murmur heard best at the RUSB Lungs:  clear to auscultation bilaterally, no wheezing, rhonchi or rales Abd: soft, nontender, no hepatomegaly Ext: no edema Skin: warm and dry Neuro:  CNs 2-12 intact, no focal abnormalities noted  EKG:  NSR, HR 70, first degree AV block with a PR interval of 260 ms, LAD, RBBB     ASSESSMENT AND PLAN:  1. Hypertension: She has not taken Altace today.  Her blood pressure is 120/80. She does note her blood pressures at home have been running low (110s) since she left the hospital. She has been limiting her salt and has changed her diet. I suspect she does not need as much blood pressure medication as she once did. I have asked her to decrease her Lasix to 20 mg daily. She can increase this to 40 mg if she has worsening edema. She should hold her Altace for now. If she sees her blood pressure increase to 140 or higher, she should resume it.  Check BMET today. 2. Edema:  She can  try to use compression stockings. 3. Complete Heart Block, Status Post Pacer: Follow up wound check with the EP clinic today. Follow up with Dr. Rayann Heman as planned. 4. Peripheral Arterial Disease: Follow up with Dr. Gwenlyn Found as planned. 5. Status Post Mechanical AVR: Continue Coumadin. Continue SBE prophylaxis. 6. Hyperlipidemia: Continue statin. 7. Disposition: Follow up with Dr. Gwenlyn Found in 04/2013 as planned.  Signed, Richardson Dopp, PA-C  03/16/2013 2:49 PM

## 2013-03-16 NOTE — Patient Instructions (Addendum)
DECREASE LASIX TO 20 MG DAILY; TAKE EXTRA LASIX ONLY WHEN YOU HAVE INCREASED SWELLING; NEW RX SENT IN TODAY  DO NOT TAKE THE ALTACE  MONITOR BP AND IS SYSTOLIC BP 0000000 THEN RESTART ALTACE  MAKE SURE TO KEEP YOUR APPT WITH DR. Gwenlyn Found 04/22/13  OUR [facility] CLINIC CHECKED YOUR INR TODAY  LAB WORK TODAY; BMET

## 2013-03-16 NOTE — Telephone Encounter (Signed)
Ivin Booty at the [facility] location stated pt in to see Richardson Dopp today and had INR check since it was due.  INR 2.9 and pt advised to continue current dose.  Stated next INR check due on 2.23.15.  Informed Erasmo Downer, PharmD will be notified.  Ivin Booty stated pt has standing order at [facility].  Message forwarded to Tommy Medal, PharmD.

## 2013-04-02 ENCOUNTER — Encounter: Payer: Self-pay | Admitting: Internal Medicine

## 2013-04-21 ENCOUNTER — Other Ambulatory Visit: Payer: Self-pay | Admitting: Pharmacist Clinician (PhC)/ Clinical Pharmacy Specialist

## 2013-04-22 ENCOUNTER — Ambulatory Visit (INDEPENDENT_AMBULATORY_CARE_PROVIDER_SITE_OTHER): Payer: Medicare Other | Admitting: Pharmacist Clinician (PhC)/ Clinical Pharmacy Specialist

## 2013-04-22 ENCOUNTER — Encounter: Payer: Self-pay | Admitting: Cardiovascular Disease

## 2013-04-22 ENCOUNTER — Ambulatory Visit (INDEPENDENT_AMBULATORY_CARE_PROVIDER_SITE_OTHER): Payer: Medicare Other | Admitting: Cardiovascular Disease

## 2013-04-22 VITALS — BP 140/80 | HR 72 | Ht 64.0 in | Wt 256.6 lb

## 2013-04-22 DIAGNOSIS — Z952 Presence of prosthetic heart valve: Secondary | ICD-10-CM

## 2013-04-22 DIAGNOSIS — Z79899 Other long term (current) drug therapy: Secondary | ICD-10-CM

## 2013-04-22 DIAGNOSIS — I739 Peripheral vascular disease, unspecified: Secondary | ICD-10-CM

## 2013-04-22 DIAGNOSIS — I6529 Occlusion and stenosis of unspecified carotid artery: Secondary | ICD-10-CM

## 2013-04-22 DIAGNOSIS — E785 Hyperlipidemia, unspecified: Secondary | ICD-10-CM

## 2013-04-22 DIAGNOSIS — Z5181 Encounter for therapeutic drug level monitoring: Secondary | ICD-10-CM

## 2013-04-22 DIAGNOSIS — I701 Atherosclerosis of renal artery: Secondary | ICD-10-CM

## 2013-04-22 DIAGNOSIS — Z954 Presence of other heart-valve replacement: Secondary | ICD-10-CM

## 2013-04-22 DIAGNOSIS — I10 Essential (primary) hypertension: Secondary | ICD-10-CM

## 2013-04-22 DIAGNOSIS — E782 Mixed hyperlipidemia: Secondary | ICD-10-CM

## 2013-04-22 DIAGNOSIS — I442 Atrioventricular block, complete: Secondary | ICD-10-CM

## 2013-04-22 LAB — PROTIME-INR
INR: 3.7 — AB (ref 0.8–1.2)
Prothrombin Time: 38.6 s — ABNORMAL HIGH (ref 9.1–12.0)

## 2013-04-22 NOTE — Assessment & Plan Note (Signed)
History of mechanical aortic valve replacement with a St. Jude aVR by Dr. [name] September 2006 because of severe AI. Her cardiac catheterization revealed normal coronary arteries at that time. Her most recent 2-D echo performed in September revealed normal LV systolic function with a well-functioning aortic prosthesis.

## 2013-04-22 NOTE — Progress Notes (Signed)
04/22/2013 [name]   31-Oct-1950  [id number]  Primary Physician Jearld Lesch, DO Primary Cardiologist: [name] MD [name]   HPI:  The patient is a 63 year old, moderately overweight, almost divorced Caucasian female mother of 9, grandmother to 2 grandchildren who has had a St. Jude AVR performed by Dr. Ceasar Mons September of 2006 because of severe aortic insufficiency. She had normal coronary arteries by cath at that time. A look at her renal arteries and demonstrated a 60% right renal artery stenosis and patent accessory left renal arteries. By duplex she does have a non-functioning left kidney. Her other problems include hypertension and hyperlipidemia. She also has a history of lupus followed by Dr. Jalene Mullet. An echo performed a year ago showed a well-functioning aortic prosthesis, and recent renal Dopplers revealed a widely patent right renal artery. She had a small bowel obstruction and underwent surgery at [facility] for perforated small bowel. She had an exploratory laparotomy with ileostomy and a right hemicolectomy 04/12/12. She saw [name] PAC back in our office 06/12/12 and her medicines were adjusted.  She was admitted in early January for symptomatic complete heart block underwent permanent pacemaker insertion by Dr. Thompson Grayer.[name] She was remitted approximately week later with orthostatic hypotension and was dehydrated. She responded to fluid resuscitation. She's had no further symptoms.  Current Outpatient Prescriptions  Medication Sig Dispense Refill  . acetaminophen (TYLENOL) 325 MG tablet Take 1-2 tablets (325-650 mg total) by mouth every 4 (four) hours as needed for mild pain.      [name] aspirin EC 81 MG EC tablet Take 1 tablet (81 mg total) by mouth daily.      . Calcium Carbonate (CALTRATE 600) 1500 MG TABS Take 1 tablet by mouth 2 (two) times daily.        . cholecalciferol (VITAMIN D) 1000 UNITS tablet Take 1,000 Units by mouth 2 (two)  times daily.      . Fluticasone-Salmeterol (ADVAIR DISKUS) 250-50 MCG/DOSE AEPB Inhale 1 puff into the lungs 2 (two) times daily.  180 each  1  . hydroxychloroquine (PLAQUENIL) 200 MG tablet Take 1 tablet (200 mg total) by mouth 2 (two) times daily.  60 tablet  11  . loratadine (CLARITIN) 10 MG tablet Take 10 mg by mouth daily.      . Multiple Vitamins-Minerals (CENTRUM SILVER PO) Take 1 tablet by mouth daily.        . predniSONE (DELTASONE) 5 MG tablet Take 5 mg by mouth daily.        . simvastatin (ZOCOR) 20 MG tablet Take 1 tablet (20 mg total) by mouth at bedtime.  90 tablet  1  . warfarin (COUMADIN) 5 MG tablet Take 7.5-10 mg by mouth daily. On Wednesday take 10mg . All other days take 7.5mg       . furosemide (LASIX) 40 MG tablet Take 40 mg by mouth daily.       No current facility-administered medications for this visit.    No Known Allergies  History   Social History  . Marital Status: Divorced    Spouse Name: N/A    Number of Children: 3  . Years of Education: N/A   Occupational History  . Not on file.   Social History Main Topics  . Smoking status: Never Smoker   . Smokeless tobacco: Never Used  . Alcohol Use: No  . Drug Use: No  . Sexual Activity: Not on file   Other Topics Concern  . Not  on file   Social History Narrative  . No narrative on file     Review of Systems: General: negative for chills, fever, night sweats or weight changes.  Cardiovascular: negative for chest pain, dyspnea on exertion, edema, orthopnea, palpitations, paroxysmal nocturnal dyspnea or shortness of breath Dermatological: negative for rash Respiratory: negative for cough or wheezing Urologic: negative for hematuria Abdominal: negative for nausea, vomiting, diarrhea, bright red blood per rectum, melena, or hematemesis Neurologic: negative for visual changes, syncope, or dizziness All other systems reviewed and are otherwise negative except as noted above.    Blood pressure 140/80,  pulse 72, height 5\' 4"  (1.626 m), weight 116.393 kg (256 lb 9.6 oz).  General appearance: alert and no distress Neck: no adenopathy, no JVD, supple, symmetrical, trachea midline, thyroid not enlarged, symmetric, no tenderness/mass/nodules and right carotid bruit Lungs: clear to auscultation bilaterally Heart: crisp prosthetic valve sounds Extremities: extremities normal, atraumatic, no cyanosis or edema  EKG not performed today  ASSESSMENT AND PLAN:   Complete heart block Status post permanent transvenous pacemaker insertion by Dr. Thompson Grayer 03/06/13.  Essential hypertension Under good control off of her Ramapril  Renal artery stenosis The left kidney is a nonfunctioning organ. The right kidney has normal blood flow without evidence of stenosis.  Carotid stenosis Remote history of vein interposition on the left. Her left Dopplers were performed 10/03/10 revealed this to be widely patent. She does have a bruit. We will recheck this.  AORTIC VALVE REPLACEMENT, HX OF, 2006 with mechanical valve History of mechanical aortic valve replacement with a St. Jude aVR by Dr. [name] September 2006 because of severe AI. Her cardiac catheterization revealed normal coronary arteries at that time. Her most recent 2-D echo performed in September revealed normal LV systolic function with a well-functioning aortic prosthesis.      [name] MD [place], [facility] 04/22/2013 3:54 PM

## 2013-04-22 NOTE — Assessment & Plan Note (Signed)
Remote history of vein interposition on the left. Her left Dopplers were performed 10/03/10 revealed this to be widely patent. She does have a bruit. We will recheck this.

## 2013-04-22 NOTE — Patient Instructions (Signed)
  Your physician wants you to follow-up with him in : 1 year with Dr Gwenlyn Found                                            and with an extender in : 6 months with an extender                    You will receive a reminder letter in the mail one month in advance. If you don't receive a letter, please call our office to schedule the follow-up appointment.   Your physician recommends that you return for lab work at your convenience, fasting    Your physician has ordered the following tests: echocardiogram in Sept. And a carotid doppler in the near future

## 2013-04-22 NOTE — Assessment & Plan Note (Signed)
On statin therapy with her most recent lipid profile performed 09/06/12 related to a cholesterol of 185, LDL 79 and HDL of 89

## 2013-04-22 NOTE — Assessment & Plan Note (Signed)
The left kidney is a nonfunctioning organ. The right kidney has normal blood flow without evidence of stenosis.

## 2013-04-22 NOTE — Assessment & Plan Note (Signed)
Under good control off of her Ramapril

## 2013-04-22 NOTE — Assessment & Plan Note (Addendum)
Status post permanent transvenous pacemaker insertion by Dr. Thompson Grayer 03/06/13.

## 2013-04-28 ENCOUNTER — Ambulatory Visit (HOSPITAL_COMMUNITY)
Admission: RE | Admit: 2013-04-28 | Discharge: 2013-04-28 | Disposition: A | Discharge status: Home / Self Care | Payer: Medicare Other | Source: Ambulatory Visit | Attending: Cardiovascular Disease | Admitting: Cardiovascular Disease

## 2013-04-28 DIAGNOSIS — I739 Peripheral vascular disease, unspecified: Secondary | ICD-10-CM | POA: Insufficient documentation

## 2013-04-28 NOTE — Progress Notes (Signed)
Carotid Duplex Completed. °Brianna L Mazza,RVT °

## 2013-05-04 ENCOUNTER — Telehealth: Payer: Self-pay | Admitting: *Deleted

## 2013-05-04 ENCOUNTER — Encounter: Payer: Self-pay | Admitting: *Deleted

## 2013-05-04 DIAGNOSIS — I6529 Occlusion and stenosis of unspecified carotid artery: Secondary | ICD-10-CM

## 2013-05-04 NOTE — Telephone Encounter (Signed)
Order placed for repeat carotid dopplers in 1 year  

## 2013-05-04 NOTE — Telephone Encounter (Signed)
Message copied by Chauncy Lean on Mon May 04, 2013  7:19 PM ------      Message from: [name]      Created: Sun May 03, 2013  6:49 PM       No change from prior study. Repeat in 12 months. ------

## 2013-05-06 ENCOUNTER — Ambulatory Visit (HOSPITAL_COMMUNITY)
Admission: RE | Admit: 2013-05-06 | Discharge: 2013-05-06 | Disposition: A | Discharge status: Home / Self Care | Payer: Medicare Other | Source: Ambulatory Visit | Attending: Cardiovascular Disease | Admitting: Cardiovascular Disease

## 2013-05-06 DIAGNOSIS — I701 Atherosclerosis of renal artery: Secondary | ICD-10-CM | POA: Insufficient documentation

## 2013-05-06 NOTE — Progress Notes (Signed)
Renal Artery Duplex Completed. °Brianna L Mazza,RVT °

## 2013-05-07 ENCOUNTER — Ambulatory Visit (INDEPENDENT_AMBULATORY_CARE_PROVIDER_SITE_OTHER): Payer: Medicare Other | Admitting: Internal Medicine

## 2013-05-07 ENCOUNTER — Ambulatory Visit (INDEPENDENT_AMBULATORY_CARE_PROVIDER_SITE_OTHER)
Admission: RE | Admit: 2013-05-07 | Discharge: 2013-05-07 | Disposition: A | Discharge status: Home / Self Care | Payer: Medicare Other | Source: Ambulatory Visit | Attending: Internal Medicine | Admitting: Internal Medicine

## 2013-05-07 ENCOUNTER — Encounter: Payer: Self-pay | Admitting: Internal Medicine

## 2013-05-07 ENCOUNTER — Other Ambulatory Visit (INDEPENDENT_AMBULATORY_CARE_PROVIDER_SITE_OTHER): Payer: Medicare Other

## 2013-05-07 VITALS — BP 130/80 | HR 86 | Ht 65.0 in | Wt 257.6 lb

## 2013-05-07 DIAGNOSIS — R0601 Orthopnea: Secondary | ICD-10-CM

## 2013-05-07 DIAGNOSIS — J45909 Unspecified asthma, uncomplicated: Secondary | ICD-10-CM

## 2013-05-07 DIAGNOSIS — G4733 Obstructive sleep apnea (adult) (pediatric): Secondary | ICD-10-CM

## 2013-05-07 DIAGNOSIS — J4520 Mild intermittent asthma, uncomplicated: Secondary | ICD-10-CM

## 2013-05-07 LAB — BRAIN NATRIURETIC PEPTIDE: PRO B NATRI PEPTIDE: 149 pg/mL — AB (ref 0.0–100.0)

## 2013-05-07 MED ORDER — METHYLPREDNISOLONE ACETATE 80 MG/ML IJ SUSP
80.0000 mg | Freq: Once | INTRAMUSCULAR | Status: AC
  Administered 2013-05-07: 80 mg via INTRAMUSCULAR

## 2013-05-07 MED ORDER — LEVALBUTEROL HCL 0.63 MG/3ML IN NEBU
0.6300 mg | INHALATION_SOLUTION | Freq: Once | RESPIRATORY_TRACT | Status: AC
  Administered 2013-05-07: 0.63 mg via RESPIRATORY_TRACT

## 2013-05-07 NOTE — Patient Instructions (Addendum)
Order- CXR  Dx orthopnea              Lab- B Nat Peptide    Dx Orthopnea   Neb xop 0.63  Depo 80  Ok to [employer] 250 with Dulera 100

## 2013-05-07 NOTE — Progress Notes (Signed)
Subjective:    Patient ID: [name], female    DOB: 1950/04/20, 63 y.o.   MRN: [id number]  HPI 10/03/10-63 year old female never smoker followed for asthma complicated by past history of lupus and aortic valve replacement. Last here April 22, 2009-she responded well with Xopenex nebulizer treatment and Depo-Medrol injection. She reports a good year with no special problems. No colds and no interventions. She keeps a little dry cough which she minimizes. We discussed ACEI Altace.  Continues Advair bid and she never filled Proair rescue script last year.  She had 2011 flu shot and has had pneumonia vaccine in the past but not sure when.  11/06/11- 63 year old female never smoker followed for asthma complicated by past history of lupus and aortic valve replacement. Cough at times-went to PCP/ [place]-? altace or procardia causing this.  Has been doing very well using Advair just once daily with no season change. Describes a couple of months of dry cough. On prednisone 5 mg daily for lupus. Continues Flonase. Ears frequently stop up.  11/06/12- 62 year old female never smoker followed for asthma complicated by past history of lupus and aortic valve replacement. FOLLOWS FOR: denies any cough, congestion,wheezing, or SOB at this time. Flu shot today. Had perforated colon, temporary colostomy, reversed. Given nebulizer treatments in hospital. Does not remember any respiratory complication. Prednisone 5 mg daily for lupus.  05/07/13- 63 year old female never smoker followed for asthma complicated by history of lupus, aortic valve replacement/ pacemaker/ warfarin. Acute visit- Cough and SOB lying down, getting worse Increased shortness of breath lying down, dry cough, head congestion. No increased leg edema. Some sore throat. Lasix reduced from 80 mg to 40 mg in January with pacemaker change. Sleeping in a recliner. Hospital gave [facility]. CXR 03/07/13 IMPRESSION:  There is no evidence of  postprocedure complication following  placement of the permanent pacemaker.  Electronically Signed  By: David [name]  On: 03/07/2013 08:48  Review of Systems- see HPI Constitutional:   No-   weight loss, night sweats, fevers, chills, fatigue, lassitude. HEENT:   No-  headaches, difficulty swallowing, tooth/dental problems, sore throat,       No-  sneezing, itching, +ear pressure, + controlled nasal congestion, post nasal drip,  CV:  No-   chest pain, orthopnea, PND, swelling in lower extremities, anasarca,dizziness, palpitations Resp: +shortness of breath with exertion or at rest.              No-   productive cough, + non-productive cough,  No-  coughing up of blood.              No-   change in color of mucus.  No- wheezing.   Skin: No-   rash or lesions. GI:  No-   heartburn, indigestion, abdominal pain, nausea, vomiting,  GU:  MS:  No-   joint pain or swelling.   Neuro- nothing unusual  Psych:  No- change in mood or affect. No depression or anxiety.  No memory loss.  Objective:   Physical Exam General- Alert, Oriented, Affect-appropriate, Distress- none acute;  obese Skin- rash-none, lesions- none, excoriation- none Lymphadenopathy- none Head- atraumatic            Eyes- Gross vision intact, PERRLA, conjunctivae clear secretions            Ears- Hearing, canals normal            Nose- Clear, no-Septal dev, mucus, polyps, erosion, perforation  Throat- Mallampati III , mucosa clear , drainage- none, tonsils- atrophic Neck- flexible , trachea midline, no stridor , thyroid nl, carotid no bruit Chest - symmetrical excursion , unlabored           Heart/CV- RRR , +crisp valve sounds,  1/6 Systolic AS  Murmur, no gallop  , no rub, nl s1 s2                           - JVD- none , edema- none, stasis changes- none, varices- none           Lung- clear to P&A, wheeze- none, cough- none , dullness-none, rub- none           Chest wall-  Abd-  Br/ Gen/ Rectal- Not done, not  indicated Extrem- cyanosis- none, clubbing, none, atrophy- none, strength- nl Neuro- grossly intact to observation  Assessment & Plan:

## 2013-05-08 ENCOUNTER — Telehealth: Payer: Self-pay | Admitting: Internal Medicine

## 2013-05-08 NOTE — Telephone Encounter (Signed)
Called spoke with pt. She had no idea what I was talking about. PCC's please advise if you guys know anythign about this? thanks

## 2013-05-11 ENCOUNTER — Other Ambulatory Visit: Payer: Self-pay | Admitting: Pharmacist Clinician (PhC)/ Clinical Pharmacy Specialist

## 2013-05-11 NOTE — Telephone Encounter (Signed)
According to referal note DME was changed to [facility]. Adams Bing, CMA

## 2013-05-12 ENCOUNTER — Telehealth: Payer: Self-pay | Admitting: Internal Medicine

## 2013-05-12 ENCOUNTER — Ambulatory Visit (INDEPENDENT_AMBULATORY_CARE_PROVIDER_SITE_OTHER): Payer: Medicare Other | Admitting: Pharmacist Clinician (PhC)/ Clinical Pharmacy Specialist

## 2013-05-12 DIAGNOSIS — Z954 Presence of other heart-valve replacement: Secondary | ICD-10-CM

## 2013-05-12 DIAGNOSIS — Z5181 Encounter for therapeutic drug level monitoring: Secondary | ICD-10-CM

## 2013-05-12 LAB — PROTIME-INR
INR: 2.8 — AB (ref 0.8–1.2)
INR: 2.8 — AB (ref <=1.1)
Prothrombin Time: 29.5 s — ABNORMAL HIGH (ref 9.1–12.0)

## 2013-05-12 NOTE — Telephone Encounter (Signed)
Called and spoke with pt. Made pt aware. Called [facility]- spoke with Peetz. She reports she does not see any order for pt. [facility] is closed. Will call them in the AM as they are now closed

## 2013-05-12 NOTE — Telephone Encounter (Signed)
Called spoke with pt. On 05/06/13 order was placed for the following: Note   [facility]-change CPAP to 8 cwp Dx OSA      Order chin strap for CPAP mask     --  According to pt she has never been on CPAP and was never told anything about this. Please advise Dr. Annamaria Boots thanks

## 2013-05-12 NOTE — Telephone Encounter (Signed)
This CPAP order apparently went to the wrong patient. Please apologize and cancel CPAP for her with [facility].

## 2013-05-13 ENCOUNTER — Ambulatory Visit: Payer: Self-pay | Admitting: Pharmacist Clinician (PhC)/ Clinical Pharmacy Specialist

## 2013-05-13 DIAGNOSIS — Z5181 Encounter for therapeutic drug level monitoring: Secondary | ICD-10-CM

## 2013-05-13 DIAGNOSIS — Z954 Presence of other heart-valve replacement: Secondary | ICD-10-CM

## 2013-05-13 NOTE — Telephone Encounter (Signed)
I spoke with [name] at [facility] and she checked an order has been cancelled. Rhea Bing, CMA

## 2013-05-19 ENCOUNTER — Telehealth: Payer: Self-pay | Admitting: *Deleted

## 2013-05-19 DIAGNOSIS — I701 Atherosclerosis of renal artery: Secondary | ICD-10-CM

## 2013-05-19 NOTE — Telephone Encounter (Signed)
Message copied by Chauncy Lean on Tue May 19, 2013  1:36 PM ------      Message from: [name]      Created: Sun May 17, 2013 12:56 PM       Mild increase in right renal velocities. Repeat in 6 months ------

## 2013-05-19 NOTE — Telephone Encounter (Signed)
Order placed for repeat renal dopplers in 6 months

## 2013-05-27 ENCOUNTER — Encounter: Payer: Self-pay | Admitting: Internal Medicine

## 2013-05-27 NOTE — Assessment & Plan Note (Addendum)
Increased cough and orthopnea might reflect asthma but the timing suggests this is related to her heart after pacemaker change. Plan-chest x-ray, nebulizer Xopenex, lab for BNP, D-dimer.

## 2013-05-28 ENCOUNTER — Telehealth: Payer: Self-pay | Admitting: Internal Medicine

## 2013-05-28 MED ORDER — FLUTICASONE-SALMETEROL 250-50 MCG/DOSE IN AEPB: 1.0000 | INHALATION_SPRAY | Freq: Two times a day (BID) | RESPIRATORY_TRACT | Status: DC

## 2013-05-28 NOTE — Telephone Encounter (Signed)
Called spoke with pt. She is aware RX has been sent. Nothing further needed

## 2013-06-10 ENCOUNTER — Other Ambulatory Visit: Payer: Self-pay | Admitting: Pharmacist Clinician (PhC)/ Clinical Pharmacy Specialist

## 2013-06-11 ENCOUNTER — Ambulatory Visit (INDEPENDENT_AMBULATORY_CARE_PROVIDER_SITE_OTHER): Payer: Medicare Other | Admitting: Pharmacist Clinician (PhC)/ Clinical Pharmacy Specialist

## 2013-06-11 DIAGNOSIS — Z954 Presence of other heart-valve replacement: Secondary | ICD-10-CM

## 2013-06-11 DIAGNOSIS — Z5181 Encounter for therapeutic drug level monitoring: Secondary | ICD-10-CM

## 2013-06-11 LAB — PROTIME-INR
INR: 4.9 — AB (ref 0.8–1.2)
Prothrombin Time: 51.7 s — ABNORMAL HIGH (ref 9.1–12.0)

## 2013-06-15 ENCOUNTER — Encounter: Payer: Medicare Other | Admitting: Internal Medicine

## 2013-06-22 LAB — PROTIME-INR: INR: 2.3 — AB (ref 0.9–1.1)

## 2013-06-24 ENCOUNTER — Telehealth: Payer: Self-pay | Admitting: *Deleted

## 2013-06-24 ENCOUNTER — Ambulatory Visit (INDEPENDENT_AMBULATORY_CARE_PROVIDER_SITE_OTHER): Payer: Medicare Other | Admitting: Pharmacist Clinician (PhC)/ Clinical Pharmacy Specialist

## 2013-06-24 DIAGNOSIS — Z954 Presence of other heart-valve replacement: Secondary | ICD-10-CM

## 2013-06-24 DIAGNOSIS — Z5181 Encounter for therapeutic drug level monitoring: Secondary | ICD-10-CM

## 2013-06-24 NOTE — Telephone Encounter (Signed)
See aanticoag note - left message earlier.

## 2013-06-24 NOTE — Telephone Encounter (Signed)
Pt was calling in regards to her blood work results.

## 2013-07-08 ENCOUNTER — Ambulatory Visit (INDEPENDENT_AMBULATORY_CARE_PROVIDER_SITE_OTHER): Payer: Medicare Other | Admitting: Internal Medicine

## 2013-07-08 ENCOUNTER — Encounter: Payer: Self-pay | Admitting: Internal Medicine

## 2013-07-08 VITALS — BP 144/81 | HR 75 | Ht 65.0 in | Wt 264.0 lb

## 2013-07-08 DIAGNOSIS — I442 Atrioventricular block, complete: Secondary | ICD-10-CM

## 2013-07-08 DIAGNOSIS — I5031 Acute diastolic (congestive) heart failure: Secondary | ICD-10-CM | POA: Insufficient documentation

## 2013-07-08 LAB — MDC_IDC_ENUM_SESS_TYPE_INCLINIC
Brady Statistic RV Percent Paced: 18 %
Date Time Interrogation Session: 20150520155331
Implantable Pulse Generator Model: 2240
Lead Channel Impedance Value: 462.5 Ohm
Lead Channel Impedance Value: 650 Ohm
Lead Channel Pacing Threshold Amplitude: 0.75 V
Lead Channel Pacing Threshold Pulse Width: 0.4 ms
Lead Channel Sensing Intrinsic Amplitude: 12 mV
Lead Channel Sensing Intrinsic Amplitude: 3.8 mV
Lead Channel Setting Pacing Amplitude: 2 V
MDC IDC MSMT BATTERY REMAINING LONGEVITY: 141.6 mo
MDC IDC MSMT BATTERY VOLTAGE: 3.04 V
MDC IDC MSMT LEADCHNL RV PACING THRESHOLD AMPLITUDE: 0.875 V
MDC IDC MSMT LEADCHNL RV PACING THRESHOLD PULSEWIDTH: 0.4 ms
MDC IDC PG SERIAL: 7587004
MDC IDC SET LEADCHNL RV PACING AMPLITUDE: 1.125
MDC IDC SET LEADCHNL RV PACING PULSEWIDTH: 0.4 ms
MDC IDC SET LEADCHNL RV SENSING SENSITIVITY: 2 mV
MDC IDC STAT BRADY RA PERCENT PACED: 1.8 %

## 2013-07-08 NOTE — Patient Instructions (Signed)
Your physician wants you to follow-up in: 12 months with [name] Hutchinson, PA You will receive a reminder letter in the mail two months in advance. If you don't receive a letter, please call our office to schedule the follow-up appointment.  Your physician has recommended you make the following change in your medication:  1) Increase Furosemide to 40mg  twice daily for 3 days then back down to 40mg  daily  2 Gram Low Sodium Diet A 2 gram sodium diet restricts the amount of sodium in the diet to no more than 2 g or 2000 mg daily. Limiting the amount of sodium is often used to help lower blood pressure. It is important if you have heart, liver, or kidney problems. Many foods contain sodium for flavor and sometimes as a preservative. When the amount of sodium in a diet needs to be low, it is important to know what to look for when choosing foods and drinks. The following includes some information and guidelines to help make it easier for you to adapt to a low sodium diet. QUICK TIPS  Do not add salt to food.  Avoid convenience items and fast food.  Choose unsalted snack foods.  Buy lower sodium products, often labeled as "lower sodium" or "no salt added."  Check food labels to learn how much sodium is in 1 serving.  When eating at a restaurant, ask that your food be prepared with less salt or none, if possible. READING FOOD LABELS FOR SODIUM INFORMATION The nutrition facts label is a good place to find how much sodium is in foods. Look for products with no more than 500 to 600 mg of sodium per meal and no more than 150 mg per serving. Remember that 2 g = 2000 mg. The food label may also list foods as:  Sodium-free: Less than 5 mg in a serving.  Very low sodium: 35 mg or less in a serving.  Low-sodium: 140 mg or less in a serving.  Light in sodium: 50% less sodium in a serving. For example, if a food that usually has 300 mg of sodium is changed to become light in sodium, it will have 150 mg  of sodium.  Reduced sodium: 25% less sodium in a serving. For example, if a food that usually has 400 mg of sodium is changed to reduced sodium, it will have 300 mg of sodium. CHOOSING FOODS Grains  Avoid: Salted crackers and snack items. Some cereals, including instant hot cereals. Bread stuffing and biscuit mixes. Seasoned rice or pasta mixes.  Choose: Unsalted snack items. Low-sodium cereals, oats, puffed wheat and rice, shredded wheat. English muffins and bread. Pasta. Meats  Avoid: Salted, canned, smoked, spiced, pickled meats, including fish and poultry. Bacon, ham, sausage, cold cuts, hot dogs, anchovies.  Choose: Low-sodium canned tuna and salmon. Fresh or frozen meat, poultry, and fish. Dairy  Avoid: Processed cheese and spreads. Cottage cheese. Buttermilk and condensed milk. Regular cheese.  Choose: Milk. Low-sodium cottage cheese. Yogurt. Sour cream. Low-sodium cheese. Fruits and Vegetables  Avoid: Regular canned vegetables. Regular canned tomato sauce and paste. Frozen vegetables in sauces. Olives. Angie Fava. Relishes. Sauerkraut.  Choose: Low-sodium canned vegetables. Low-sodium tomato sauce and paste. Frozen or fresh vegetables. Fresh and frozen fruit. Condiments  Avoid: Canned and packaged gravies. Worcestershire sauce. Tartar sauce. Barbecue sauce. Soy sauce. Steak sauce. Ketchup. Onion, garlic, and table salt. Meat flavorings and tenderizers.  Choose: Fresh and dried herbs and spices. Low-sodium varieties of mustard and ketchup. Lemon juice. Tabasco sauce. Horseradish.  SAMPLE 2 GRAM SODIUM MEAL PLAN Breakfast / Sodium (mg)  1 cup low-fat milk / [id number] mg  2 slices whole-wheat toast / 270 mg  1 tbs heart-healthy margarine / 153 mg  1 hard-boiled egg / 139 mg  1 small orange / 0 mg Lunch / Sodium (mg)  1 cup raw carrots / 76 mg   cup hummus / 298 mg  1 cup low-fat milk / 143 mg   cup red grapes / 2 mg  1 whole-wheat pita bread / 356 mg Dinner / Sodium  (mg)  1 cup whole-wheat pasta / 2 mg  1 cup low-sodium tomato sauce / 73 mg  3 oz lean ground beef / 57 mg  1 small side salad (1 cup raw spinach leaves,  cup cucumber,  cup yellow bell pepper) with 1 tsp olive oil and 1 tsp red wine vinegar / 25 mg Snack / Sodium (mg)  1 container low-fat vanilla yogurt / 107 mg  3 graham cracker squares / 127 mg Nutrient Analysis  Calories: 2033  Protein: 77 g  Carbohydrate: 282 g  Fat: 72 g  Sodium: 1971 mg Document Released: 02/05/2005 Document Revised: 04/30/2011 Document Reviewed: 05/09/2009 ExitCare Patient Information 2014 [place].

## 2013-07-08 NOTE — Progress Notes (Signed)
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  Since her PPM implant, the patient reports doing well.  She just returned from [place] where she had significant increase in salt in her diet.  With this, she has had more SOB and BLE edema than usual.  Today, she denies symptoms of palpitations, chest pain,  dizziness, presyncope, or syncope.  The patient is otherwise without complaint today.   Past Medical History  Diagnosis Date  . Asthma   . Lupus (systemic lupus erythematosus)   . PVD (peripheral vascular disease)     60% R renal artery stenosis; non-functioning L kidney  . Hypertension   . Dyslipidemia   . Chronic anticoagulation   . History of echocardiogram 11/08/2011    EF >55%; mild-mod concentric LVH; trace aortic regurg.; LA mild-mod dilated  . History of nuclear stress test 01/20/2010    normal pattern of perfusion; low risk scan  . H/O Doppler ultrasound 10/03/2010    carotid duplex - R ICA normal patency; L CCA-ICA bypass gradt patent common to interal cartoid bypass graft   . H/O mechanical aortic valve replacement   . Complete heart block 1//2015    Implantation of a dual chamber pacemaker on 03-06-2013 by Dr Rayann Heman.  The patient received a STJ model number [id number] pacemaker with model number [id number] right ventricular lead.   . Carotid artery disease    Past Surgical History  Procedure Laterality Date  . Aortic valve replacement  11/29/006    [facility]; St. Jude; on coumadin  . Exploratory laparotomy  04/12/2012    small bowel perf; ileostomy & R hemicolectomy   . Tubal ligation  11/1982  . Cholecystectomy  1999  . Cardiac catheterization  11/01/2004    define anatomy   . Carotid endarterectomy      lt.  . Pacemaker insertion  03/06/2013    SJM [employer] DR PPM implanted by Dr Rayann Heman for complete heart block    Current Outpatient Prescriptions  Medication Sig Dispense Refill   . aspirin EC 81 MG EC tablet Take 1 tablet (81 mg total) by mouth daily.      . AZITHROMYCIN PO Z PAC (TAKES LAST ONE TODAY 07/08/13)      . Calcium Carbonate (CALTRATE 600) 1500 MG TABS Take 1 tablet by mouth 2 (two) times daily.        . cholecalciferol (VITAMIN D) 1000 UNITS tablet Take 1,000 Units by mouth 2 (two) times daily.      . Fluticasone-Salmeterol (ADVAIR DISKUS) 250-50 MCG/DOSE AEPB Inhale 1 puff into the lungs 2 (two) times daily.  180 each  1  . furosemide (LASIX) 80 MG tablet Take 40 mg by mouth daily.       . hydroxychloroquine (PLAQUENIL) 200 MG tablet Take 1 tablet (200 mg total) by mouth 2 (two) times daily.  60 tablet  11  . loratadine (CLARITIN) 10 MG tablet Take 10 mg by mouth daily.      . Multiple Vitamins-Minerals (CENTRUM SILVER PO) Take 1 tablet by mouth daily.        . predniSONE (DELTASONE) 5 MG tablet Take 5 mg by mouth daily.        [name] warfarin (COUMADIN) 5 MG tablet Take 7.5-10 mg by mouth daily. On Wednesday take 10mg . All other days take 7.5mg        No current facility-administered medications for this visit.    Physical  Exam: Filed Vitals:   07/08/13 1514  BP: 144/81  Pulse: 75  Height: 5\' 5"  (1.651 m)  Weight: 264 lb (119.75 kg)    GEN- The patient is well appearing, alert and oriented x 3 today.   Head- normocephalic, atraumatic Eyes-  Sclera clear, conjunctiva pink Ears- hearing intact Oropharynx- clear Lungs- few bibasilar rales, normal work of breathing Chest- pacemaker pocket is well healed Heart- Regular rate and rhythm, mechanical S2 GI- soft, NT, ND, + BS Extremities- no clubbing, cyanosis,+1 edema  Pacemaker interrogation- reviewed in detail today,  See PACEART report  Assessment and Plan:  1. Transient complete heart block Normal pacemaker function See Pace Art report No changes today  2. Acute diastolic dysfunction Likely due to recent increase in dietary salt on vacation Increase lasix to 40mg  BID x 3 days then return to  40mg  daily 2 gram sodium diet Follow-up with Dr Gwenlyn Found if not improved  3. S/p mechanical AVR Continue coumadin  Merlin compliance encouraged Return to see Jerene Pitch in the device clinic in 1 year

## 2013-07-09 ENCOUNTER — Encounter: Payer: Self-pay | Admitting: Internal Medicine

## 2013-07-14 ENCOUNTER — Other Ambulatory Visit: Payer: Self-pay | Admitting: Pharmacist Clinician (PhC)/ Clinical Pharmacy Specialist

## 2013-07-15 ENCOUNTER — Ambulatory Visit (INDEPENDENT_AMBULATORY_CARE_PROVIDER_SITE_OTHER): Payer: Medicare Other | Admitting: Pharmacist Clinician (PhC)/ Clinical Pharmacy Specialist

## 2013-07-15 DIAGNOSIS — Z5181 Encounter for therapeutic drug level monitoring: Secondary | ICD-10-CM

## 2013-07-15 DIAGNOSIS — Z954 Presence of other heart-valve replacement: Secondary | ICD-10-CM

## 2013-07-15 LAB — PROTIME-INR
INR: 3.5 — ABNORMAL HIGH (ref 0.8–1.2)
Prothrombin Time: 36.8 s — ABNORMAL HIGH (ref 9.1–12.0)

## 2013-08-17 ENCOUNTER — Other Ambulatory Visit: Payer: Self-pay | Admitting: Pharmacist Clinician (PhC)/ Clinical Pharmacy Specialist

## 2013-08-20 LAB — PROTIME-INR
INR: 2.6 — ABNORMAL HIGH (ref 0.8–1.2)
PROTHROMBIN TIME: 26.7 s — AB (ref 9.1–12.0)

## 2013-08-24 ENCOUNTER — Ambulatory Visit (INDEPENDENT_AMBULATORY_CARE_PROVIDER_SITE_OTHER): Payer: Medicare Other | Admitting: Pharmacist

## 2013-08-24 DIAGNOSIS — Z954 Presence of other heart-valve replacement: Secondary | ICD-10-CM

## 2013-08-24 DIAGNOSIS — Z5181 Encounter for therapeutic drug level monitoring: Secondary | ICD-10-CM

## 2013-09-01 ENCOUNTER — Telehealth: Payer: Self-pay | Admitting: Cardiovascular Disease
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
## 2013-09-01 NOTE — Telephone Encounter (Signed)
New Prob   Pt has some concerns regarding her coumadin and would like to speak to a nurse. Please call.

## 2013-09-02 NOTE — Telephone Encounter (Signed)
Spoke with pt.  Will have her hold Coumadin x 1 day then resume previous dose.  Will plan on following up as previously planned.

## 2013-09-08 ENCOUNTER — Other Ambulatory Visit: Payer: Self-pay | Admitting: Cardiovascular Disease

## 2013-09-08 ENCOUNTER — Encounter: Payer: Self-pay | Admitting: Internal Medicine

## 2013-09-08 ENCOUNTER — Ambulatory Visit (INDEPENDENT_AMBULATORY_CARE_PROVIDER_SITE_OTHER): Payer: Medicare Other | Admitting: *Deleted

## 2013-09-08 DIAGNOSIS — Z950 Presence of cardiac pacemaker: Secondary | ICD-10-CM

## 2013-09-08 LAB — MDC_IDC_ENUM_SESS_TYPE_INCLINIC
Battery Remaining Longevity: 129.6 mo
Brady Statistic RA Percent Paced: 2.7 %
Date Time Interrogation Session: 20150721151904
Implantable Pulse Generator Model: 2240
Lead Channel Impedance Value: 462.5 Ohm
Lead Channel Impedance Value: 625 Ohm
Lead Channel Pacing Threshold Amplitude: 0.875 V
Lead Channel Pacing Threshold Amplitude: 1 V
Lead Channel Pacing Threshold Pulse Width: 0.4 ms
Lead Channel Pacing Threshold Pulse Width: 0.4 ms
Lead Channel Sensing Intrinsic Amplitude: 12 mV
Lead Channel Setting Pacing Amplitude: 1.125
Lead Channel Setting Pacing Amplitude: 2 V
Lead Channel Setting Sensing Sensitivity: 2 mV
MDC IDC MSMT BATTERY VOLTAGE: 3.02 V
MDC IDC MSMT LEADCHNL RA PACING THRESHOLD AMPLITUDE: 1 V
MDC IDC MSMT LEADCHNL RA SENSING INTR AMPL: 4.4 mV
MDC IDC MSMT LEADCHNL RV PACING THRESHOLD PULSEWIDTH: 0.4 ms
MDC IDC PG SERIAL: 7587004
MDC IDC SET LEADCHNL RV PACING PULSEWIDTH: 0.4 ms
MDC IDC STAT BRADY RV PERCENT PACED: 59 %
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
## 2013-09-09 NOTE — Telephone Encounter (Signed)
Rx was sent to pharmacy electronically. 

## 2013-09-15 ENCOUNTER — Other Ambulatory Visit: Payer: Self-pay | Admitting: Pharmacist Clinician (PhC)/ Clinical Pharmacy Specialist

## 2013-09-16 ENCOUNTER — Ambulatory Visit (INDEPENDENT_AMBULATORY_CARE_PROVIDER_SITE_OTHER): Payer: Medicare Other | Admitting: Pharmacist Clinician (PhC)/ Clinical Pharmacy Specialist

## 2013-09-16 DIAGNOSIS — Z954 Presence of other heart-valve replacement: Secondary | ICD-10-CM

## 2013-09-16 DIAGNOSIS — Z5181 Encounter for therapeutic drug level monitoring: Secondary | ICD-10-CM

## 2013-09-16 LAB — PROTIME-INR
INR: 3 — ABNORMAL HIGH (ref 0.8–1.2)
Prothrombin Time: 31 s — ABNORMAL HIGH (ref 9.1–12.0)

## 2013-10-07 ENCOUNTER — Telehealth: Payer: Self-pay | Admitting: Pharmacist Clinician (PhC)/ Clinical Pharmacy Specialist

## 2013-10-07 NOTE — Telephone Encounter (Signed)
Pt has concern about prescription for vicodin, was told by pharmacist to avoid.  Returned call, explained interaction, pt states has not taken more than 3 tabs/day.  Took plain tylenol today, but has not had any vicodin.  I explained to limit herself to 3-4 tabs total of vicodin and/or tylenol.  Pt voiced understanding.

## 2013-10-12 ENCOUNTER — Ambulatory Visit (INDEPENDENT_AMBULATORY_CARE_PROVIDER_SITE_OTHER): Payer: Medicare Other | Admitting: *Deleted

## 2013-10-12 ENCOUNTER — Encounter: Payer: Self-pay | Admitting: Internal Medicine

## 2013-10-12 ENCOUNTER — Telehealth: Payer: Self-pay | Admitting: Cardiology

## 2013-10-12 DIAGNOSIS — I498 Other specified cardiac arrhythmias: Secondary | ICD-10-CM

## 2013-10-12 DIAGNOSIS — R001 Bradycardia, unspecified: Secondary | ICD-10-CM

## 2013-10-12 DIAGNOSIS — I442 Atrioventricular block, complete: Secondary | ICD-10-CM

## 2013-10-12 NOTE — Telephone Encounter (Signed)
LMOVM reminding pt to send remote transmission.   

## 2013-10-12 NOTE — Progress Notes (Signed)
Remote pacemaker transmission.   

## 2013-10-13 LAB — MDC_IDC_ENUM_SESS_TYPE_INCLINIC
Brady Statistic RA Percent Paced: 1.3 %
Lead Channel Impedance Value: 430 Ohm
Lead Channel Impedance Value: 580 Ohm
Lead Channel Pacing Threshold Amplitude: 0.875 V
Lead Channel Sensing Intrinsic Amplitude: 12 mV
Lead Channel Setting Sensing Sensitivity: 2 mV
MDC IDC MSMT BATTERY VOLTAGE: 3.01 V
MDC IDC MSMT LEADCHNL RA SENSING INTR AMPL: 3.2 mV
MDC IDC MSMT LEADCHNL RV PACING THRESHOLD PULSEWIDTH: 0.4 ms
MDC IDC PG SERIAL: 7587004
MDC IDC SET LEADCHNL RA PACING AMPLITUDE: 2 V
MDC IDC SET LEADCHNL RV PACING AMPLITUDE: 1.125
MDC IDC SET LEADCHNL RV PACING PULSEWIDTH: 0.4 ms
MDC IDC STAT BRADY RV PERCENT PACED: 54 %

## 2013-10-19 ENCOUNTER — Other Ambulatory Visit: Payer: Self-pay | Admitting: Pharmacist Clinician (PhC)/ Clinical Pharmacy Specialist

## 2013-10-20 ENCOUNTER — Encounter: Payer: Self-pay | Admitting: Cardiology

## 2013-10-20 ENCOUNTER — Ambulatory Visit (INDEPENDENT_AMBULATORY_CARE_PROVIDER_SITE_OTHER): Payer: Medicare Other | Admitting: Pharmacist Clinician (PhC)/ Clinical Pharmacy Specialist

## 2013-10-20 DIAGNOSIS — Z5181 Encounter for therapeutic drug level monitoring: Secondary | ICD-10-CM

## 2013-10-20 DIAGNOSIS — Z954 Presence of other heart-valve replacement: Secondary | ICD-10-CM

## 2013-10-20 LAB — PROTIME-INR
INR: 5.7 — AB (ref 0.8–1.2)
Prothrombin Time: 60.3 s — ABNORMAL HIGH (ref 9.1–12.0)

## 2013-11-03 ENCOUNTER — Other Ambulatory Visit: Payer: Self-pay | Admitting: Pharmacist Clinician (PhC)/ Clinical Pharmacy Specialist

## 2013-11-04 LAB — PROTIME-INR
INR: 3.5 — ABNORMAL HIGH (ref 0.8–1.2)
Prothrombin Time: 36.3 s — ABNORMAL HIGH (ref 9.1–12.0)

## 2013-11-05 ENCOUNTER — Ambulatory Visit: Payer: Medicare Other | Admitting: Internal Medicine

## 2013-11-06 ENCOUNTER — Ambulatory Visit (INDEPENDENT_AMBULATORY_CARE_PROVIDER_SITE_OTHER): Payer: Medicare Other | Admitting: Pharmacist Clinician (PhC)/ Clinical Pharmacy Specialist

## 2013-11-06 DIAGNOSIS — Z954 Presence of other heart-valve replacement: Secondary | ICD-10-CM

## 2013-11-06 DIAGNOSIS — Z5181 Encounter for therapeutic drug level monitoring: Secondary | ICD-10-CM

## 2013-11-10 ENCOUNTER — Encounter: Payer: Self-pay | Admitting: Cardiovascular Disease

## 2013-11-10 ENCOUNTER — Ambulatory Visit (INDEPENDENT_AMBULATORY_CARE_PROVIDER_SITE_OTHER): Payer: Medicare Other | Admitting: Cardiovascular Disease

## 2013-11-10 VITALS — BP 130/80 | HR 81 | Ht 65.0 in | Wt 254.0 lb

## 2013-11-10 DIAGNOSIS — Z950 Presence of cardiac pacemaker: Secondary | ICD-10-CM

## 2013-11-10 DIAGNOSIS — Z954 Presence of other heart-valve replacement: Secondary | ICD-10-CM

## 2013-11-10 NOTE — Assessment & Plan Note (Signed)
History of left internal carotid artery bypass surgery. Her left Doppler study performed a year ago revealed her common and internal carotid artery to be completely occluded on the left with right widely patent.

## 2013-11-10 NOTE — Assessment & Plan Note (Signed)
Status post St. Jude aortic valve replacement in 2006 which we have been following by 2-D echocardiography on an annual basis. She is on Coumadin anticoagulation.

## 2013-11-10 NOTE — Patient Instructions (Signed)
  We will see you back in follow up in 6 months with an extender and 1 year with Dr Gwenlyn Found.

## 2013-11-10 NOTE — Assessment & Plan Note (Signed)
On statin therapy with recent lipid profile performed 05/09/13 revealing a total cholesterol 153, HDL of 98 and LDL of 40

## 2013-11-10 NOTE — Assessment & Plan Note (Signed)
Status post permanent transvenous pacemaker insertion by Dr. Thompson Grayer which he follows

## 2013-11-10 NOTE — Progress Notes (Signed)
11/10/2013 [name]   August 26, 1950  [id number]  Primary Physician Jearld Lesch, DO Primary Cardiologist: [name] MD [name]   HPI:  The patient is a 63 year old, moderately overweight, almost divorced Caucasian female mother of 71, grandmother to 2 grandchildren who has had a St. Jude AVR performed by Dr. Ceasar Mons September of 2006 because of severe aortic insufficiency. She had normal coronary arteries by cath at that time. A look at her renal arteries and demonstrated a 60% right renal artery stenosis and patent accessory left renal arteries. By duplex she does have a non-functioning left kidney. Her other problems include hypertension and hyperlipidemia. She also has a history of lupus followed by Dr. Jalene Mullet. An echo performed a year ago showed a well-functioning aortic prosthesis, and recent renal Dopplers revealed a widely patent right renal artery. She had a small bowel obstruction and underwent surgery at [facility] for perforated small bowel. She had an exploratory laparotomy with ileostomy and a right hemicolectomy 04/12/12. She saw [name] PAC back in our office 06/12/12 and her medicines were adjusted.  She was admitted in early January for symptomatic complete heart block underwent permanent pacemaker insertion by Dr. Thompson Grayer.[name] She was remitted approximately week later with orthostatic hypotension and was dehydrated. She responded to fluid resuscitation.  Since I saw her in the office 6 months ago she's remained fairly stable. Dr. Rayann Heman has followed her pacemaker as an outpatient. She has developed gout in the interim and has been treated for this by her primary care physician.    Current Outpatient Prescriptions  Medication Sig Dispense Refill  . aspirin EC 81 MG EC tablet Take 1 tablet (81 mg total) by mouth daily.      . Calcium Carbonate (CALTRATE 600) 1500 MG TABS Take 1 tablet by mouth 2 (two) times daily.        . cholecalciferol  (VITAMIN D) 1000 UNITS tablet Take 1,000 Units by mouth 2 (two) times daily.      . Fluticasone-Salmeterol (ADVAIR DISKUS) 250-50 MCG/DOSE AEPB Inhale 1 puff into the lungs 2 (two) times daily.  180 each  1  . furosemide (LASIX) 80 MG tablet Take 40 mg by mouth daily.       . hydroxychloroquine (PLAQUENIL) 200 MG tablet Take 1 tablet (200 mg total) by mouth 2 (two) times daily.  60 tablet  11  . loratadine (CLARITIN) 10 MG tablet Take 10 mg by mouth daily.      . Multiple Vitamins-Minerals (CENTRUM SILVER PO) Take 1 tablet by mouth daily.        . Omega-3 Fatty Acids (FISH OIL CONCENTRATE PO) Take 1 tablet by mouth daily.      . predniSONE (DELTASONE) 5 MG tablet Take 5 mg by mouth daily.        . Probiotic Product (PROBIOTIC DAILY PO) Take 1 tablet by mouth daily.      . simvastatin (ZOCOR) 20 MG tablet Take 1 tablet by mouth at  bedtime  90 tablet  2  . warfarin (COUMADIN) 5 MG tablet Take 7.5-10 mg by mouth daily. On Wednesday take 10mg . All other days take 7.5mg        No current facility-administered medications for this visit.    No Known Allergies  History   Social History  . Marital Status: Divorced    Spouse Name: N/A    Number of Children: 3  . Years of Education: N/A   Occupational History  . Not  on file.   Social History Main Topics  . Smoking status: Never Smoker   . Smokeless tobacco: Never Used  . Alcohol Use: No  . Drug Use: No  . Sexual Activity: Not on file   Other Topics Concern  . Not on file   Social History Narrative  . No narrative on file     Review of Systems: General: negative for chills, fever, night sweats or weight changes.  Cardiovascular: negative for chest pain, dyspnea on exertion, edema, orthopnea, palpitations, paroxysmal nocturnal dyspnea or shortness of breath Dermatological: negative for rash Respiratory: negative for cough or wheezing Urologic: negative for hematuria Abdominal: negative for nausea, vomiting, diarrhea, bright red  blood per rectum, melena, or hematemesis Neurologic: negative for visual changes, syncope, or dizziness All other systems reviewed and are otherwise negative except as noted above.    Blood pressure 130/80, pulse 81, height 5\' 5"  (1.651 m), weight 254 lb (115.214 kg).  General appearance: alert and no distress Neck: no adenopathy, no JVD, supple, symmetrical, trachea midline, thyroid not enlarged, symmetric, no tenderness/mass/nodules and soft left carotid bruit Lungs: clear to auscultation bilaterally Heart: soft outflow tract murmur Extremities: extremities normal, atraumatic, no cyanosis or edema  EKG normal sinus rhythm at 81 with right bundle branch block and left axis deviation  ASSESSMENT AND PLAN:   AORTIC VALVE REPLACEMENT, HX OF, 2006 with mechanical valve Status post St. Jude aortic valve replacement in 2006 which we have been following by 2-D echocardiography on an annual basis. She is on Coumadin anticoagulation.  Hyperlipidemia On statin therapy with recent lipid profile performed 05/09/13 revealing a total cholesterol 153, HDL of 98 and LDL of 40  Complete heart block Status post permanent transvenous pacemaker insertion by Dr. Thompson Grayer which he follows  Carotid stenosis History of left internal carotid artery bypass surgery. Her left Doppler study performed a year ago revealed her common and internal carotid artery to be completely occluded on the left with right widely patent.      [name] MD FACP,FACC,FAHA, [facility] 11/10/2013 10:55 AM

## 2013-11-18 ENCOUNTER — Ambulatory Visit (HOSPITAL_COMMUNITY)
Admission: RE | Admit: 2013-11-18 | Discharge: 2013-11-18 | Disposition: A | Discharge status: Home / Self Care | Payer: Medicare Other | Source: Ambulatory Visit | Attending: Cardiovascular Disease | Admitting: Cardiovascular Disease

## 2013-11-18 ENCOUNTER — Encounter (HOSPITAL_COMMUNITY): Payer: Medicare Other

## 2013-11-18 DIAGNOSIS — I080 Rheumatic disorders of both mitral and aortic valves: Secondary | ICD-10-CM | POA: Diagnosis not present

## 2013-11-18 DIAGNOSIS — I10 Essential (primary) hypertension: Secondary | ICD-10-CM | POA: Insufficient documentation

## 2013-11-18 DIAGNOSIS — I509 Heart failure, unspecified: Secondary | ICD-10-CM | POA: Insufficient documentation

## 2013-11-18 DIAGNOSIS — Z954 Presence of other heart-valve replacement: Secondary | ICD-10-CM | POA: Diagnosis not present

## 2013-11-18 DIAGNOSIS — Z950 Presence of cardiac pacemaker: Secondary | ICD-10-CM | POA: Insufficient documentation

## 2013-11-18 DIAGNOSIS — Z952 Presence of prosthetic heart valve: Secondary | ICD-10-CM

## 2013-11-18 DIAGNOSIS — I359 Nonrheumatic aortic valve disorder, unspecified: Secondary | ICD-10-CM | POA: Diagnosis present

## 2013-11-18 DIAGNOSIS — E785 Hyperlipidemia, unspecified: Secondary | ICD-10-CM | POA: Diagnosis not present

## 2013-11-18 DIAGNOSIS — I369 Nonrheumatic tricuspid valve disorder, unspecified: Secondary | ICD-10-CM

## 2013-11-18 NOTE — Progress Notes (Signed)
2D Echo Performed 11/18/2013    Marygrace Drought, RCS

## 2013-11-23 ENCOUNTER — Other Ambulatory Visit: Payer: Self-pay | Admitting: Pharmacist Clinician (PhC)/ Clinical Pharmacy Specialist

## 2013-11-24 ENCOUNTER — Ambulatory Visit (INDEPENDENT_AMBULATORY_CARE_PROVIDER_SITE_OTHER): Payer: Medicare Other | Admitting: Cardiovascular Disease

## 2013-11-24 ENCOUNTER — Encounter: Payer: Self-pay | Admitting: Cardiovascular Disease

## 2013-11-24 ENCOUNTER — Ambulatory Visit (INDEPENDENT_AMBULATORY_CARE_PROVIDER_SITE_OTHER): Payer: Medicare Other | Admitting: Pharmacist Clinician (PhC)/ Clinical Pharmacy Specialist

## 2013-11-24 VITALS — BP 132/80 | HR 84 | Ht 65.0 in | Wt 250.8 lb

## 2013-11-24 DIAGNOSIS — Z952 Presence of prosthetic heart valve: Secondary | ICD-10-CM

## 2013-11-24 DIAGNOSIS — Z5181 Encounter for therapeutic drug level monitoring: Secondary | ICD-10-CM

## 2013-11-24 DIAGNOSIS — Z954 Presence of other heart-valve replacement: Secondary | ICD-10-CM

## 2013-11-24 LAB — PROTIME-INR
INR: 5.3 — ABNORMAL HIGH (ref 0.8–1.2)
Prothrombin Time: 55.8 s — ABNORMAL HIGH (ref 9.1–12.0)

## 2013-11-24 NOTE — Progress Notes (Addendum)
11/24/2013 [name]   1950/04/12  [id number]  Primary Physician Jearld Lesch, DO Primary Cardiologist: [name] MD [name]   HPI:  The patient is a 63 year old, moderately overweight, almost divorced Caucasian female mother of 42, grandmother to 2 grandchildren who has had a St. Jude AVR performed by Dr. Ceasar Mons September of 2006 because of severe aortic insufficiency. She had normal coronary arteries by cath at that time. A look at her renal arteries and demonstrated a 60% right renal artery stenosis and patent accessory left renal arteries. By duplex she does have a non-functioning left kidney. Her other problems include hypertension and hyperlipidemia. She also has a history of lupus followed by Dr. Jalene Mullet. An echo performed a year ago showed a well-functioning aortic prosthesis, and recent renal Dopplers revealed a widely patent right renal artery. She had a small bowel obstruction and underwent surgery at [facility] for perforated small bowel. She had an exploratory laparotomy with ileostomy and a right hemicolectomy 04/12/12. She saw [name] PAC back in our office 06/12/12 and her medicines were adjusted.  She was admitted in early January for symptomatic complete heart block underwent permanent pacemaker insertion by Dr. Thompson Grayer.[name] She was remitted approximately week later with orthostatic hypotension and was dehydrated. She responded to fluid resuscitation.  I saw her 2 weeks ago and obtained a 2-D echo which revealed a decline in her ejection fraction to 35-40% with mild increase in transvalvular gradients. She does complain of some shortness of breath and fatigue.    Current Outpatient Prescriptions  Medication Sig Dispense Refill  . aspirin EC 81 MG EC tablet Take 1 tablet (81 mg total) by mouth daily.      . Calcium Carbonate (CALTRATE 600) 1500 MG TABS Take 1 tablet by mouth 2 (two) times daily.        . cholecalciferol (VITAMIN D) 1000  UNITS tablet Take 1,000 Units by mouth 2 (two) times daily.      . Fluticasone-Salmeterol (ADVAIR DISKUS) 250-50 MCG/DOSE AEPB Inhale 1 puff into the lungs 2 (two) times daily.  180 each  1  . furosemide (LASIX) 80 MG tablet Take 40 mg by mouth daily.       . hydroxychloroquine (PLAQUENIL) 200 MG tablet Take 1 tablet (200 mg total) by mouth 2 (two) times daily.  60 tablet  11  . loratadine (CLARITIN) 10 MG tablet Take 10 mg by mouth daily.      . Multiple Vitamins-Minerals (CENTRUM SILVER PO) Take 1 tablet by mouth daily.        . Omega-3 Fatty Acids (FISH OIL CONCENTRATE PO) Take 1 tablet by mouth daily.      . predniSONE (DELTASONE) 5 MG tablet Take 5 mg by mouth daily.        . Probiotic Product (PROBIOTIC DAILY PO) Take 1 tablet by mouth daily.      . simvastatin (ZOCOR) 20 MG tablet Take 1 tablet by mouth at  bedtime  90 tablet  2  . warfarin (COUMADIN) 5 MG tablet Take 7.5-10 mg by mouth daily. On Wednesday take 10mg . All other days take 7.5mg        No current facility-administered medications for this visit.    No Known Allergies  History   Social History  . Marital Status: Divorced    Spouse Name: N/A    Number of Children: 3  . Years of Education: N/A   Occupational History  . Not on file.  Social History Main Topics  . Smoking status: Never Smoker   . Smokeless tobacco: Never Used  . Alcohol Use: No  . Drug Use: No  . Sexual Activity: Not on file   Other Topics Concern  . Not on file   Social History Narrative  . No narrative on file     Review of Systems: General: negative for chills, fever, night sweats or weight changes.  Cardiovascular: negative for chest pain, dyspnea on exertion, edema, orthopnea, palpitations, paroxysmal nocturnal dyspnea or shortness of breath Dermatological: negative for rash Respiratory: negative for cough or wheezing Urologic: negative for hematuria Abdominal: negative for nausea, vomiting, diarrhea, bright red blood per rectum,  melena, or hematemesis Neurologic: negative for visual changes, syncope, or dizziness All other systems reviewed and are otherwise negative except as noted above.    Blood pressure 132/80, pulse 84, height 5\' 5"  (1.651 m), weight 250 lb 12.8 oz (113.762 kg).  General appearance: alert and no distress Neck: no adenopathy, no carotid bruit, no JVD, supple, symmetrical, trachea midline and thyroid not enlarged, symmetric, no tenderness/mass/nodules Lungs: clear to auscultation bilaterally Heart: this mechanical aortic valve sounds with an upper tract murmur Extremities: extremities normal, atraumatic, no cyanosis or edema  EKG not performed today  ASSESSMENT AND PLAN:   AORTIC VALVE REPLACEMENT, HX OF, 2006 with mechanical valve History of St. Jude aVR in 2006 for aortic insufficiency. She had normal coronary arteries at that time. 2-D echo performed a year ago showed normal LV systolic function and a well-functioning mechanical aortic prosthesis. Recent echo showed decline in ejection fraction to 35-40% with mild increase in her transvalvular gradients. She does complain of shortness of breath and fatigue. I will review these echo findings with Dr. Stanford Breed prior to making a decision regarding further workup.      [name] MD FACP,FACC,FAHA, [facility] 11/24/2013 11:54 AM   Addendum: I reviewed the 2-D echocardiogram with Drs Croitoru and Stanford Breed. The consensus was that the left ventricular ejection fraction was not 35% but more like 50%. The velocities across the valve did not appear to be significantly changed from prior echo. I'm going to continue to follow her noninvasively at this point. We will repeat a 2-D echocardiogram in 6 months.

## 2013-11-24 NOTE — Assessment & Plan Note (Addendum)
History of St. Jude aVR in 2006 for aortic insufficiency. She had normal coronary arteries at that time. 2-D echo performed a year ago showed normal LV systolic function and a well-functioning mechanical aortic prosthesis. Recent echo showed decline in ejection fraction to 35-40% with mild increase in her transvalvular gradients. She does complain of shortness of breath and fatigue. I will review these echo findings with Dr. Stanford Breed prior to making a decision regarding further workup.  I reviewed the 2-D echocardiogram with Drs. Croitoru and Dr. Stanford Breed. The consensus was that the ejection fraction was higher than originally reported, in the 50% range. There was paradoxical septal motion probably related to the pacemaker. Based on this, I am going to continue to take her medically. Recheck a 2-D echocardiogram in 6 months.

## 2013-11-24 NOTE — Patient Instructions (Signed)
Your physician wants you to follow-up in: 6 months Dr Gwenlyn Found. You will receive a reminder letter in the mail two months in advance. If you don't receive a letter, please call our office to schedule the follow-up appointment.

## 2013-11-25 ENCOUNTER — Ambulatory Visit (INDEPENDENT_AMBULATORY_CARE_PROVIDER_SITE_OTHER)
Admission: RE | Admit: 2013-11-25 | Discharge: 2013-11-25 | Disposition: A | Discharge status: Home / Self Care | Payer: Medicare Other | Source: Ambulatory Visit | Attending: Adult Health | Admitting: Adult Health

## 2013-11-25 ENCOUNTER — Telehealth: Payer: Self-pay | Admitting: *Deleted

## 2013-11-25 ENCOUNTER — Encounter: Payer: Self-pay | Admitting: Adult Health

## 2013-11-25 ENCOUNTER — Other Ambulatory Visit (INDEPENDENT_AMBULATORY_CARE_PROVIDER_SITE_OTHER): Payer: Medicare Other

## 2013-11-25 ENCOUNTER — Telehealth: Payer: Self-pay | Admitting: Internal Medicine

## 2013-11-25 ENCOUNTER — Ambulatory Visit (INDEPENDENT_AMBULATORY_CARE_PROVIDER_SITE_OTHER): Payer: Medicare Other | Admitting: Adult Health

## 2013-11-25 VITALS — BP 126/84 | HR 84 | Temp 97.8°F | Ht 65.0 in | Wt 254.6 lb

## 2013-11-25 DIAGNOSIS — Z5181 Encounter for therapeutic drug level monitoring: Secondary | ICD-10-CM

## 2013-11-25 DIAGNOSIS — Z23 Encounter for immunization: Secondary | ICD-10-CM

## 2013-11-25 DIAGNOSIS — R0600 Dyspnea, unspecified: Secondary | ICD-10-CM

## 2013-11-25 DIAGNOSIS — Z7901 Long term (current) use of anticoagulants: Secondary | ICD-10-CM

## 2013-11-25 DIAGNOSIS — J4521 Mild intermittent asthma with (acute) exacerbation: Secondary | ICD-10-CM

## 2013-11-25 LAB — BASIC METABOLIC PANEL
BUN: 28 mg/dL — AB (ref 6–23)
CO2: 26 meq/L (ref 19–32)
Calcium: 9.3 mg/dL (ref 8.4–10.5)
Chloride: 104 mEq/L (ref 96–112)
Creatinine, Ser: 1.1 mg/dL (ref 0.4–1.2)
GFR: 54.21 mL/min — ABNORMAL LOW (ref >60.00)
GLUCOSE: 89 mg/dL (ref 70–99)
POTASSIUM: 3.8 meq/L (ref 3.5–5.1)
SODIUM: 139 meq/L (ref 135–145)

## 2013-11-25 LAB — PROTIME-INR
INR: 5.4 ratio (ref 0.8–1.0)
Prothrombin Time: 57 s (ref 9.6–13.1)

## 2013-11-25 LAB — BRAIN NATRIURETIC PEPTIDE: Pro B Natriuretic peptide (BNP): 79 pg/mL (ref 0.0–100.0)

## 2013-11-25 NOTE — Telephone Encounter (Signed)
I spoke with patient and gave her the information

## 2013-11-25 NOTE — Telephone Encounter (Signed)
Per CY: would pt be able to come see TP this morning?  Discussed with TP, can double-book this morning. Called spoke with patient, offered work-in appt with TP this morning with understanding that TP will not be in the office this afternoon.  Pt lives approx 24mins away and will strive to get here as close to 1130 as possible.  Appt scheduled.  Nothing further needed at this time; will sign off.

## 2013-11-25 NOTE — Telephone Encounter (Signed)
Message copied by Chauncy Lean on Wed Nov 25, 2013 12:44 PM ------      Message from: [name]      Created: Tue Nov 24, 2013  5:32 PM      Regarding: 2-D echocardiogram       I reviewed the recent echo with doctors Croitoru and Dr. Stanford Breed. The consensus was that her ejection fraction was not 35% but more like 50%. I do not think he needs to further work this up at this time. If you could convince the patient of the great. With repeat a 2-D echo in 6 months.            JJB ------

## 2013-11-25 NOTE — Patient Instructions (Addendum)
Chest xray  And labs today  Flu shot today  Increase Prednisone 10mg  daily for 5 days then back to 5mg  daily  follow up Dr. Annamaria Boots  In 2 weeks and As needed   Follow up with cardiology as planned   Late add :  Mucinex DM Twice daily as needed .  Allegra 180mg  daily As needed  Drainage

## 2013-11-25 NOTE — Telephone Encounter (Signed)
Called spoke with patient who reports dry cough, wheezing, increased SOB, chest congestion w/ gurgling when she lies down x1week.  Denies hemoptysis, f/c/s, n/v/d, PND, leg swelling.  Pt is asking to be seen sooner than her 10.9.15 appt with [facility] for a breathing treatment and possibly depo injection. Dr Annamaria Boots please advise, thank you.

## 2013-11-26 ENCOUNTER — Telehealth: Payer: Self-pay | Admitting: *Deleted

## 2013-11-26 MED ORDER — LEVALBUTEROL HCL 0.63 MG/3ML IN NEBU
0.6300 mg | INHALATION_SOLUTION | Freq: Once | RESPIRATORY_TRACT | Status: AC
  Administered 2013-11-26: 0.63 mg via RESPIRATORY_TRACT

## 2013-11-26 NOTE — Addendum Note (Signed)
Addended by: Parke Poisson E on: 11/26/2013 02:28 PM   Modules accepted: Orders

## 2013-11-26 NOTE — Assessment & Plan Note (Addendum)
?   Flare +/- CHF  Will bump up steroids slightly d/t INR elevation  Check labs w/ BNP  and CXR today  Repeat INR as may need additional follow up with CC  xopenex neb x 1   Plan  Chest xray  And labs today  Flu shot today  Increase Prednisone 10mg  daily for 5 days then back to 5mg  daily  mucinex DM Twice daily  As needed  Cough/congesiton  Allegra 180 mg daily As needed   follow up Dr. Annamaria Boots  In 2 weeks and As needed   Follow up with cardiology as planned

## 2013-11-26 NOTE — Telephone Encounter (Signed)
Received call report on 10/7 for INR results >> 5.4  Discussed with TP that afternoon > call patient and inform her that her coumadin level remains elevated.  Remain off coumadin as directed and send results to coumadin clinic.  She needs to call us immediately with any signs of bleeding.  Called spoke with patient and discussed lab results with her.  Pt doing well today other than symptoms she presented with yesterday - no signs of bleeding.  She is aware we will call again with rest of lab results.  Labs sent to [facility].

## 2013-11-26 NOTE — Progress Notes (Signed)
Subjective:    Patient ID: [name], female    DOB: [birth year], 63 y.o.   MRN: [id number]  HPI 10/03/10-63 year old female never smoker followed for asthma complicated by past history of lupus and aortic valve replacement. Last here April 22, 2009-she responded well with Xopenex nebulizer treatment and Depo-Medrol injection. She reports a good year with no special problems. No colds and no interventions. She keeps a little dry cough which she minimizes. We discussed ACEI Altace.  Continues Advair bid and she never filled Proair rescue script last year.  She had 2011 flu shot and has had pneumonia vaccine in the past but not sure when.  11/06/11- 63 year old female never smoker followed for asthma complicated by past history of lupus and aortic valve replacement. Cough at times-went to PCP/ [address]-? altace or procardia causing this.  Has been doing very well using Advair just once daily with no season change. Describes a couple of months of dry cough. On prednisone 5 mg daily for lupus. Continues Flonase. Ears frequently stop up.  11/06/12- 63 year old female never smoker followed for asthma complicated by past history of lupus and aortic valve replacement. FOLLOWS FOR: denies any cough, congestion,wheezing, or SOB at this time. Flu shot today. Had perforated colon, temporary colostomy, reversed. Given nebulizer treatments in hospital. Does not remember any respiratory complication. Prednisone 5 mg daily for lupus.  05/07/13- 63 year old female never smoker followed for asthma complicated by history of lupus, aortic valve replacement/ pacemaker/ warfarin. Acute visit- Cough and SOB lying down, getting worse Increased shortness of breath lying down, dry cough, head congestion. No increased leg edema. Some sore throat. Lasix reduced from 80 mg to 40 mg in January with pacemaker change. Sleeping in a recliner. Hospital gave [facility]. CXR 03/07/13 IMPRESSION:  There is no evidence of  postprocedure complication following  placement of the permanent pacemaker.  Electronically Signed  By: David [name]  On: 03/07/2013 08:48  11/25/13 Acute OV  63year-old female never smoker followed for asthma complicated by history of lupus, aortic valve replacement/ pacemaker/ warfarin. Pt complains of here for dry cough, increased SOB, wheezing, chest congestion and gurgling w/ lying down, fatigue x 1-2 weeks.  Last INR has been elevated ~5 followed by CC and coumadin on hold x 2 days . No known bleeding.  Denies f/c/s, n/v/d, hemoptysis, PND, leg swelling. Recently seen by cards with echo showing decreased EF ~ 35% . +orthopnea .   Review of Systems- see HPI Constitutional:   No-   weight loss, night sweats, fevers, chills, fatigue, lassitude. HEENT:   No-  headaches, difficulty swallowing, tooth/dental problems, sore throat,       No-  sneezing, itching, +ear pressure, + controlled nasal congestion, post nasal drip,  CV:  No-   chest pain, orthopnea, PND, swelling in lower extremities, anasarca,dizziness, palpitations Resp: +shortness of breath with exertion or at rest.              No-   productive cough, + non-productive cough,  No-  coughing up of blood.              No-   change in color of mucus.  No- wheezing.   Skin: No-   rash or lesions. GI:  No-   heartburn, indigestion, abdominal pain, nausea, vomiting,  GU:  MS:  No-   joint pain or swelling.   Neuro- nothing unusual  Psych:  No- change in mood or affect. No depression or anxiety.  No memory loss.  Objective:  Physical Exam General- Alert, Oriented, Affect-appropriate, Distress- none acute;  obese Skin- rash-none, lesions- none, excoriation- none Lymphadenopathy- none Head- atraumatic            Eyes- Gross vision intact, PERRLA, conjunctivae clear secretions            Ears- Hearing, canals normal            Nose- Clear, no-Septal dev, mucus, polyps, erosion, perforation             Throat- Mallampati III ,  mucosa clear , drainage- none, tonsils- atrophic Neck- flexible , trachea midline, no stridor , thyroid nl, carotid no bruit Chest - symmetrical excursion , unlabored           Heart/CV- RRR , +crisp valve sounds,  1/6 Systolic AS  Murmur, no gallop  , no rub, nl s1 s2                           - JVD- none , edema- none, stasis changes- none, varices- none           Lung- clear to P&A, wheeze- none, cough- none , dullness-none, rub- none           Chest wall-  Abd-  Br/ Gen/ Rectal- Not done, not indicated Extrem- cyanosis- none, clubbing, none, atrophy- none, strength- nl Neuro- grossly intact to observation  Assessment & Plan:

## 2013-11-27 ENCOUNTER — Telehealth: Payer: Self-pay | Admitting: Internal Medicine

## 2013-11-27 ENCOUNTER — Ambulatory Visit: Payer: Medicare Other | Admitting: Internal Medicine

## 2013-11-27 NOTE — Telephone Encounter (Signed)
WCB next week. Pt's INR continues to be elevated and her coumadin is currently on hold per the coumadin clinic TP would like to know what the plans are regarding pt's dosing

## 2013-11-27 NOTE — Telephone Encounter (Signed)
New Prob    Has some questions regarding updated Coumadin dosages. Please call.

## 2013-11-27 NOTE — Telephone Encounter (Signed)
Gay Filler at [facility] is returning call & can be reached at [phone] by 5:00 PM today.  Satira Anis

## 2013-11-27 NOTE — Progress Notes (Signed)
Quick Note:  Called spoke with patient, advised of cxr results / recs as stated by TP. Pt verbalized her understanding and denied any questions. ______ 

## 2013-11-30 NOTE — Telephone Encounter (Signed)
Will forward message to Tommy Medal, PharmD at [facility] who is managing pt's Coumadin to give further insight.

## 2013-12-02 ENCOUNTER — Telehealth: Payer: Self-pay | Admitting: Pharmacist Clinician (PhC)/ Clinical Pharmacy Specialist

## 2013-12-02 ENCOUNTER — Ambulatory Visit (INDEPENDENT_AMBULATORY_CARE_PROVIDER_SITE_OTHER): Payer: Medicare Other | Admitting: Pharmacist Clinician (PhC)/ Clinical Pharmacy Specialist

## 2013-12-02 DIAGNOSIS — Z5181 Encounter for therapeutic drug level monitoring: Secondary | ICD-10-CM

## 2013-12-02 DIAGNOSIS — Z954 Presence of other heart-valve replacement: Secondary | ICD-10-CM

## 2013-12-02 DIAGNOSIS — Z952 Presence of prosthetic heart valve: Secondary | ICD-10-CM

## 2013-12-02 NOTE — Telephone Encounter (Signed)
Spoke with patient, she has had her prednisone increased recently.  Because of repeated high INRs will have patient hold dose today and repeat INR tomorrow.  Pt states no bleeding/bruising concerns, will go to lab in AM.

## 2013-12-02 NOTE — Telephone Encounter (Signed)
Staff note sent, we are aware of elevated INR.  Dose has been adjusted

## 2013-12-03 ENCOUNTER — Other Ambulatory Visit: Payer: Self-pay | Admitting: Pharmacist Clinician (PhC)/ Clinical Pharmacy Specialist

## 2013-12-04 ENCOUNTER — Ambulatory Visit (INDEPENDENT_AMBULATORY_CARE_PROVIDER_SITE_OTHER): Payer: Medicare Other | Admitting: Pharmacist Clinician (PhC)/ Clinical Pharmacy Specialist

## 2013-12-04 DIAGNOSIS — Z952 Presence of prosthetic heart valve: Secondary | ICD-10-CM

## 2013-12-04 DIAGNOSIS — Z5181 Encounter for therapeutic drug level monitoring: Secondary | ICD-10-CM

## 2013-12-04 DIAGNOSIS — Z954 Presence of other heart-valve replacement: Secondary | ICD-10-CM

## 2013-12-04 LAB — PROTIME-INR
INR: 4.1 — ABNORMAL HIGH (ref 0.8–1.2)
Prothrombin Time: 43.1 s — ABNORMAL HIGH (ref 9.1–12.0)

## 2013-12-04 NOTE — Progress Notes (Signed)
Erroneous encounter

## 2013-12-10 ENCOUNTER — Other Ambulatory Visit: Payer: Self-pay | Admitting: Pharmacist Clinician (PhC)/ Clinical Pharmacy Specialist

## 2013-12-11 ENCOUNTER — Ambulatory Visit (INDEPENDENT_AMBULATORY_CARE_PROVIDER_SITE_OTHER): Payer: Medicare Other | Admitting: Pharmacist Clinician (PhC)/ Clinical Pharmacy Specialist

## 2013-12-11 DIAGNOSIS — Z952 Presence of prosthetic heart valve: Secondary | ICD-10-CM

## 2013-12-11 DIAGNOSIS — Z5181 Encounter for therapeutic drug level monitoring: Secondary | ICD-10-CM

## 2013-12-11 DIAGNOSIS — Z954 Presence of other heart-valve replacement: Secondary | ICD-10-CM

## 2013-12-11 LAB — PROTIME-INR
INR: 2.9 — ABNORMAL HIGH (ref 0.8–1.2)
Prothrombin Time: 30.2 s — ABNORMAL HIGH (ref 9.1–12.0)

## 2013-12-28 ENCOUNTER — Other Ambulatory Visit: Payer: Self-pay | Admitting: Pharmacist Clinician (PhC)/ Clinical Pharmacy Specialist

## 2013-12-29 ENCOUNTER — Ambulatory Visit (INDEPENDENT_AMBULATORY_CARE_PROVIDER_SITE_OTHER): Payer: Medicare Other | Admitting: Pharmacist Clinician (PhC)/ Clinical Pharmacy Specialist

## 2013-12-29 DIAGNOSIS — Z954 Presence of other heart-valve replacement: Secondary | ICD-10-CM

## 2013-12-29 DIAGNOSIS — Z952 Presence of prosthetic heart valve: Secondary | ICD-10-CM

## 2013-12-29 DIAGNOSIS — Z5181 Encounter for therapeutic drug level monitoring: Secondary | ICD-10-CM

## 2013-12-29 LAB — PROTIME-INR
INR: 3.7 — ABNORMAL HIGH (ref 0.8–1.2)
Prothrombin Time: 38.9 s — ABNORMAL HIGH (ref 9.1–12.0)

## 2014-01-11 ENCOUNTER — Other Ambulatory Visit: Payer: Self-pay | Admitting: Pharmacist Clinician (PhC)/ Clinical Pharmacy Specialist

## 2014-01-12 ENCOUNTER — Ambulatory Visit (INDEPENDENT_AMBULATORY_CARE_PROVIDER_SITE_OTHER): Payer: Medicare Other | Admitting: Pharmacist Clinician (PhC)/ Clinical Pharmacy Specialist

## 2014-01-12 DIAGNOSIS — Z954 Presence of other heart-valve replacement: Secondary | ICD-10-CM

## 2014-01-12 DIAGNOSIS — Z952 Presence of prosthetic heart valve: Secondary | ICD-10-CM

## 2014-01-12 DIAGNOSIS — Z5181 Encounter for therapeutic drug level monitoring: Secondary | ICD-10-CM

## 2014-01-12 LAB — PROTIME-INR
INR: 5.3 — AB (ref 0.8–1.2)
Prothrombin Time: 55.9 s — ABNORMAL HIGH (ref 9.1–12.0)

## 2014-01-13 ENCOUNTER — Ambulatory Visit (INDEPENDENT_AMBULATORY_CARE_PROVIDER_SITE_OTHER): Payer: Medicare Other | Admitting: *Deleted

## 2014-01-13 ENCOUNTER — Telehealth: Payer: Self-pay | Admitting: Cardiology

## 2014-01-13 ENCOUNTER — Encounter: Payer: Self-pay | Admitting: Internal Medicine

## 2014-01-13 DIAGNOSIS — R001 Bradycardia, unspecified: Secondary | ICD-10-CM

## 2014-01-13 DIAGNOSIS — I442 Atrioventricular block, complete: Secondary | ICD-10-CM

## 2014-01-13 LAB — MDC_IDC_ENUM_SESS_TYPE_REMOTE
Battery Remaining Longevity: 126 mo
Battery Remaining Percentage: 95.5 %
Battery Voltage: 3.01 V
Brady Statistic AP VP Percent: 1.3 %
Brady Statistic AS VS Percent: 32 %
Date Time Interrogation Session: 20151125171342
Implantable Pulse Generator Serial Number: 7587004
Lead Channel Impedance Value: 480 Ohm
Lead Channel Pacing Threshold Amplitude: 1 V
Lead Channel Pacing Threshold Amplitude: 1.25 V
Lead Channel Pacing Threshold Pulse Width: 0.4 ms
Lead Channel Sensing Intrinsic Amplitude: 5 mV
Lead Channel Setting Pacing Amplitude: 2 V
Lead Channel Setting Pacing Pulse Width: 0.4 ms
MDC IDC MSMT LEADCHNL RA PACING THRESHOLD PULSEWIDTH: 0.4 ms
MDC IDC MSMT LEADCHNL RV IMPEDANCE VALUE: 630 Ohm
MDC IDC MSMT LEADCHNL RV SENSING INTR AMPL: 12 mV
MDC IDC SET LEADCHNL RV PACING AMPLITUDE: 1.5 V
MDC IDC SET LEADCHNL RV SENSING SENSITIVITY: 2 mV
MDC IDC STAT BRADY AP VS PERCENT: 1 %
MDC IDC STAT BRADY AS VP PERCENT: 67 %
MDC IDC STAT BRADY RA PERCENT PACED: 1.2 %
MDC IDC STAT BRADY RV PERCENT PACED: 68 %

## 2014-01-13 NOTE — Progress Notes (Signed)
Remote pacemaker transmission.   

## 2014-01-13 NOTE — Telephone Encounter (Signed)
Spoke with pt and reminded pt of remote transmission that is due today. Pt verbalized understanding.   

## 2014-01-18 ENCOUNTER — Other Ambulatory Visit: Payer: Self-pay | Admitting: Pharmacist Clinician (PhC)/ Clinical Pharmacy Specialist

## 2014-01-18 DIAGNOSIS — Z7901 Long term (current) use of anticoagulants: Secondary | ICD-10-CM

## 2014-01-18 DIAGNOSIS — Z952 Presence of prosthetic heart valve: Secondary | ICD-10-CM

## 2014-01-19 LAB — PROTIME-INR: INR: 2.4 — AB (ref 0.9–1.1)

## 2014-01-20 ENCOUNTER — Ambulatory Visit (INDEPENDENT_AMBULATORY_CARE_PROVIDER_SITE_OTHER): Payer: Medicare Other | Admitting: Pharmacist Clinician (PhC)/ Clinical Pharmacy Specialist

## 2014-01-20 DIAGNOSIS — Z952 Presence of prosthetic heart valve: Secondary | ICD-10-CM

## 2014-01-20 DIAGNOSIS — Z954 Presence of other heart-valve replacement: Secondary | ICD-10-CM

## 2014-01-20 DIAGNOSIS — Z5181 Encounter for therapeutic drug level monitoring: Secondary | ICD-10-CM

## 2014-01-26 ENCOUNTER — Encounter: Payer: Self-pay | Admitting: Cardiology

## 2014-01-26 LAB — PROTIME-INR: INR: 2.1 — AB (ref 0.9–1.1)

## 2014-01-28 ENCOUNTER — Ambulatory Visit (INDEPENDENT_AMBULATORY_CARE_PROVIDER_SITE_OTHER): Payer: Medicare Other | Admitting: Pharmacist Clinician (PhC)/ Clinical Pharmacy Specialist

## 2014-01-28 ENCOUNTER — Encounter (HOSPITAL_COMMUNITY): Payer: Self-pay | Admitting: Internal Medicine

## 2014-01-28 DIAGNOSIS — Z952 Presence of prosthetic heart valve: Secondary | ICD-10-CM

## 2014-01-28 DIAGNOSIS — Z954 Presence of other heart-valve replacement: Secondary | ICD-10-CM

## 2014-01-28 DIAGNOSIS — Z5181 Encounter for therapeutic drug level monitoring: Secondary | ICD-10-CM

## 2014-02-15 ENCOUNTER — Encounter: Payer: Self-pay | Admitting: Internal Medicine

## 2014-02-15 ENCOUNTER — Other Ambulatory Visit: Payer: Self-pay | Admitting: Pharmacist Clinician (PhC)/ Clinical Pharmacy Specialist

## 2014-02-15 ENCOUNTER — Ambulatory Visit (INDEPENDENT_AMBULATORY_CARE_PROVIDER_SITE_OTHER): Payer: Medicare Other | Admitting: Internal Medicine

## 2014-02-15 VITALS — BP 128/74 | HR 75 | Ht 65.0 in | Wt 266.6 lb

## 2014-02-15 DIAGNOSIS — I5031 Acute diastolic (congestive) heart failure: Secondary | ICD-10-CM

## 2014-02-15 DIAGNOSIS — J4521 Mild intermittent asthma with (acute) exacerbation: Secondary | ICD-10-CM

## 2014-02-15 NOTE — Patient Instructions (Signed)
Increase lasix to 80 mg daily x 3 days, then drop back to 40 mg daily  Increase prednisone to 10 mg daily, then drop back to 5 mg daily  Please call as needed

## 2014-02-15 NOTE — Progress Notes (Signed)
Subjective:    Patient ID: [name], female    DOB: 08-25-1950, 63 y.o.   MRN: [id number]  HPI 10/03/10-63 year old female never smoker followed for asthma complicated by past history of lupus and aortic valve replacement. Last here April 22, 2009-she responded well with Xopenex nebulizer treatment and Depo-Medrol injection. She reports a good year with no special problems. No colds and no interventions. She keeps a little dry cough which she minimizes. We discussed ACEI Altace.  Continues Advair bid and she never filled Proair rescue script last year.  She had 2011 flu shot and has had pneumonia vaccine in the past but not sure when.  11/06/11- 63 year old female never smoker followed for asthma complicated by past history of lupus and aortic valve replacement. Cough at times-went to PCP/ [place]-? altace or procardia causing this.  Has been doing very well using Advair just once daily with no season change. Describes a couple of months of dry cough. On prednisone 5 mg daily for lupus. Continues Flonase. Ears frequently stop up.  11/06/12- 63 year old female never smoker followed for asthma complicated by past history of lupus and aortic valve replacement. FOLLOWS FOR: denies any cough, congestion,wheezing, or SOB at this time. Flu shot today. Had perforated colon, temporary colostomy, reversed. Given nebulizer treatments in hospital. Does not remember any respiratory complication. Prednisone 5 mg daily for lupus.  05/07/13- 63 year old female never smoker followed for asthma complicated by history of lupus, aortic valve replacement/ pacemaker/ warfarin. Acute visit- Cough and SOB lying down, getting worse Increased shortness of breath lying down, dry cough, head congestion. No increased leg edema. Some sore throat. Lasix reduced from 80 mg to 40 mg in January with pacemaker change. Sleeping in a recliner. Hospital gave [facility]. CXR 03/07/13 IMPRESSION:  There is no evidence of  postprocedure complication following  placement of the permanent pacemaker.  Electronically Signed  By: David [name]  On: 03/07/2013 08:48  11/25/13 Acute OV  63year-old female never smoker followed for asthma complicated by history of lupus, aortic valve replacement/ pacemaker/ warfarin. Pt complains of here for dry cough, increased SOB, wheezing, chest congestion and gurgling w/ lying down, fatigue x 1-2 weeks.  Last INR has been elevated ~5 followed by [facility] and coumadin on hold x 2 days . No known bleeding.  Denies f/c/s, n/v/d, hemoptysis, PND, leg swelling. Recently seen by cards with echo showing decreased EF ~ 35% . +orthopnea .   02/15/14- 63 year old female never smoker followed for asthma complicated by history of lupus, aortic valve replacement/ pacemaker/ warfarin. FOLLOWS FOR: Cough has returned since last visit with TP; SOB as well. Has been going on for about 1 week now. Denies any fever or chills.  Cough cleared after last visit but recurred starting about a week ago with no obvious trigger. Increased dyspnea on exertion past 2 or 3 weeks. She has missed Lasix only in the last 2 days. CXR 11/25/13 IMPRESSION: 1. Stable chest. No acute cardiopulmonary disease. 2. Median sternotomy and cardiac valve replacement. Cardiac pacer in stable position. Stable cardiomegaly, no congestive heart failure. Electronically Signed  By: Marcello Moores Register  On: 11/25/2013 14:51  Review of Systems- see HPI Constitutional:   No-   weight loss, night sweats, fevers, chills, fatigue, lassitude. HEENT:   No-  headaches, difficulty swallowing, tooth/dental problems, sore throat,       No-  sneezing, itching, +ear pressure, + controlled nasal congestion, post nasal drip,  CV:  No-   chest pain, orthopnea, PND, swelling in lower  extremities, anasarca,dizziness, palpitations Resp: +shortness of breath with exertion or at rest.              No-   productive cough, + non-productive cough,  No-   coughing up of blood.              No-   change in color of mucus.  No- wheezing.   Skin: No-   rash or lesions. GI:  No-   heartburn, indigestion, abdominal pain, nausea, vomiting,  GU:  MS:  No-   joint pain or swelling.   Neuro- nothing unusual  Psych:  No- change in mood or affect. No depression or anxiety.  No memory loss.  Objective:   Physical Exam General- Alert, Oriented, Affect-appropriate, Distress- none acute;  obese Skin- rash-none, lesions- none, excoriation- none Lymphadenopathy- none Head- atraumatic            Eyes- Gross vision intact, PERRLA, conjunctivae clear secretions            Ears- Hearing, canals normal            Nose- Clear, no-Septal dev, mucus, polyps, erosion, perforation             Throat- Mallampati III , mucosa clear , drainage- none, tonsils- atrophic Neck- flexible , trachea midline, no stridor , thyroid nl, carotid no bruit Chest - symmetrical excursion , unlabored           Heart/CV- RRR , +crisp valve sounds,  1/6 Systolic AS  Murmur, no gallop  , no rub, nl s1 s2                           - JVD+1 , edema+2 in feet, stasis changes- none, varices- none           Lung- clear to P&A, wheeze- none, cough- none , dullness-none, rub- none, rales-none           Chest wall- L pacer Abd-  Br/ Gen/ Rectal- Not done, not indicated Extrem- cyanosis- none, clubbing, none, atrophy- none, strength- nl,        + superficial varices Neuro- grossly intact to observation  Assessment & Plan:

## 2014-02-16 LAB — PROTIME-INR
INR: 2.2 — ABNORMAL HIGH (ref 0.8–1.2)
Prothrombin Time: 22.7 s — ABNORMAL HIGH (ref 9.1–12.0)

## 2014-02-17 ENCOUNTER — Ambulatory Visit (INDEPENDENT_AMBULATORY_CARE_PROVIDER_SITE_OTHER): Payer: Medicare Other | Admitting: Pharmacist Clinician (PhC)/ Clinical Pharmacy Specialist

## 2014-02-17 DIAGNOSIS — Z954 Presence of other heart-valve replacement: Secondary | ICD-10-CM

## 2014-02-17 DIAGNOSIS — Z5181 Encounter for therapeutic drug level monitoring: Secondary | ICD-10-CM

## 2014-02-17 DIAGNOSIS — Z952 Presence of prosthetic heart valve: Secondary | ICD-10-CM

## 2014-02-21 NOTE — Assessment & Plan Note (Signed)
Cannot exclude an asthma component to recent increased symptoms. Plan-increase prednisone to 10 mg daily 3 days then back to 5 mg daily maintenance

## 2014-02-21 NOTE — Assessment & Plan Note (Signed)
Suspect recent cough and increasing dyspnea on exertion or related to her neck vein distention and peripheral edema due to fluid retention/heart failure. Plan-increase Lasix to 80 mg daily 3 days then 40 mg daily

## 2014-03-03 ENCOUNTER — Other Ambulatory Visit: Payer: Self-pay | Admitting: Pharmacist Clinician (PhC)/ Clinical Pharmacy Specialist

## 2014-03-04 ENCOUNTER — Ambulatory Visit (INDEPENDENT_AMBULATORY_CARE_PROVIDER_SITE_OTHER): Payer: Self-pay | Admitting: Pharmacist Clinician (PhC)/ Clinical Pharmacy Specialist

## 2014-03-04 DIAGNOSIS — Z952 Presence of prosthetic heart valve: Secondary | ICD-10-CM

## 2014-03-04 DIAGNOSIS — Z954 Presence of other heart-valve replacement: Secondary | ICD-10-CM

## 2014-03-04 DIAGNOSIS — Z5181 Encounter for therapeutic drug level monitoring: Secondary | ICD-10-CM

## 2014-03-04 LAB — PROTIME-INR
INR: 2.8 — ABNORMAL HIGH (ref 0.8–1.2)
Prothrombin Time: 29.4 s — ABNORMAL HIGH (ref 9.1–12.0)

## 2014-03-25 ENCOUNTER — Other Ambulatory Visit: Payer: Self-pay | Admitting: Pharmacist Clinician (PhC)/ Clinical Pharmacy Specialist

## 2014-03-26 ENCOUNTER — Ambulatory Visit (INDEPENDENT_AMBULATORY_CARE_PROVIDER_SITE_OTHER): Payer: Medicare Other | Admitting: Pharmacist Clinician (PhC)/ Clinical Pharmacy Specialist

## 2014-03-26 DIAGNOSIS — Z954 Presence of other heart-valve replacement: Secondary | ICD-10-CM

## 2014-03-26 DIAGNOSIS — Z952 Presence of prosthetic heart valve: Secondary | ICD-10-CM

## 2014-03-26 DIAGNOSIS — Z5181 Encounter for therapeutic drug level monitoring: Secondary | ICD-10-CM

## 2014-03-26 LAB — PROTIME-INR
INR: 4.3 — ABNORMAL HIGH (ref 0.8–1.2)
PROTHROMBIN TIME: 44.7 s — AB (ref 9.1–12.0)

## 2014-04-06 ENCOUNTER — Other Ambulatory Visit: Payer: Self-pay | Admitting: Cardiovascular Disease

## 2014-04-06 NOTE — Telephone Encounter (Signed)
Rx(s) sent to pharmacy electronically.  

## 2014-04-13 ENCOUNTER — Other Ambulatory Visit: Payer: Self-pay | Admitting: Pharmacist Clinician (PhC)/ Clinical Pharmacy Specialist

## 2014-04-14 ENCOUNTER — Ambulatory Visit (INDEPENDENT_AMBULATORY_CARE_PROVIDER_SITE_OTHER): Payer: Self-pay | Admitting: Pharmacist Clinician (PhC)/ Clinical Pharmacy Specialist

## 2014-04-14 DIAGNOSIS — Z952 Presence of prosthetic heart valve: Secondary | ICD-10-CM

## 2014-04-14 DIAGNOSIS — Z5181 Encounter for therapeutic drug level monitoring: Secondary | ICD-10-CM

## 2014-04-14 DIAGNOSIS — Z954 Presence of other heart-valve replacement: Secondary | ICD-10-CM

## 2014-04-14 LAB — PROTIME-INR
INR: 4.3 — AB (ref 0.8–1.2)
Prothrombin Time: 45.5 s — ABNORMAL HIGH (ref 9.1–12.0)

## 2014-04-19 ENCOUNTER — Ambulatory Visit (INDEPENDENT_AMBULATORY_CARE_PROVIDER_SITE_OTHER): Payer: Medicare Other | Admitting: *Deleted

## 2014-04-19 DIAGNOSIS — R001 Bradycardia, unspecified: Secondary | ICD-10-CM

## 2014-04-19 LAB — MDC_IDC_ENUM_SESS_TYPE_REMOTE
Battery Remaining Longevity: 121 mo
Brady Statistic AP VP Percent: 1 %
Brady Statistic AP VS Percent: 1 %
Brady Statistic AS VS Percent: 24 %
Brady Statistic RA Percent Paced: 1 %
Date Time Interrogation Session: 20160229170902
Implantable Pulse Generator Model: 2240
Implantable Pulse Generator Serial Number: 7587004
Lead Channel Impedance Value: 440 Ohm
Lead Channel Pacing Threshold Amplitude: 0.875 V
Lead Channel Pacing Threshold Pulse Width: 0.4 ms
Lead Channel Sensing Intrinsic Amplitude: 12 mV
Lead Channel Sensing Intrinsic Amplitude: 2.6 mV
Lead Channel Setting Pacing Pulse Width: 0.4 ms
Lead Channel Setting Sensing Sensitivity: 2 mV
MDC IDC MSMT BATTERY REMAINING PERCENTAGE: 95.5 %
MDC IDC MSMT BATTERY VOLTAGE: 3.02 V
MDC IDC MSMT LEADCHNL RV IMPEDANCE VALUE: 550 Ohm
MDC IDC SET LEADCHNL RA PACING AMPLITUDE: 2 V
MDC IDC SET LEADCHNL RV PACING AMPLITUDE: 1.125
MDC IDC STAT BRADY AS VP PERCENT: 75 %
MDC IDC STAT BRADY RV PERCENT PACED: 76 %

## 2014-04-19 NOTE — Progress Notes (Signed)
Remote pacemaker transmission.   

## 2014-04-28 ENCOUNTER — Other Ambulatory Visit: Payer: Self-pay | Admitting: Pharmacist Clinician (PhC)/ Clinical Pharmacy Specialist

## 2014-04-29 ENCOUNTER — Ambulatory Visit (HOSPITAL_COMMUNITY)
Admission: RE | Admit: 2014-04-29 | Discharge: 2014-04-29 | Disposition: A | Discharge status: Home / Self Care | Payer: Medicare Other | Source: Ambulatory Visit | Attending: Cardiology | Admitting: Cardiology

## 2014-04-29 ENCOUNTER — Ambulatory Visit (INDEPENDENT_AMBULATORY_CARE_PROVIDER_SITE_OTHER): Payer: Medicare Other | Admitting: Pharmacist Clinician (PhC)/ Clinical Pharmacy Specialist

## 2014-04-29 ENCOUNTER — Encounter: Payer: Self-pay | Admitting: *Deleted

## 2014-04-29 DIAGNOSIS — I6522 Occlusion and stenosis of left carotid artery: Secondary | ICD-10-CM

## 2014-04-29 DIAGNOSIS — I6529 Occlusion and stenosis of unspecified carotid artery: Secondary | ICD-10-CM

## 2014-04-29 DIAGNOSIS — Z5181 Encounter for therapeutic drug level monitoring: Secondary | ICD-10-CM

## 2014-04-29 DIAGNOSIS — Z952 Presence of prosthetic heart valve: Secondary | ICD-10-CM

## 2014-04-29 DIAGNOSIS — Z954 Presence of other heart-valve replacement: Secondary | ICD-10-CM

## 2014-04-29 DIAGNOSIS — I659 Occlusion and stenosis of unspecified precerebral artery: Secondary | ICD-10-CM

## 2014-04-29 LAB — PROTIME-INR
INR: 2.7 — ABNORMAL HIGH (ref 0.8–1.2)
Prothrombin Time: 30.6 s — ABNORMAL HIGH (ref 9.1–12.0)

## 2014-04-29 NOTE — Progress Notes (Signed)
Carotid Duplex Completed. °Brianna L Mazza,RVT °

## 2014-05-04 ENCOUNTER — Encounter: Payer: Self-pay | Admitting: *Deleted

## 2014-05-06 ENCOUNTER — Encounter: Payer: Self-pay | Admitting: Internal Medicine

## 2014-05-12 ENCOUNTER — Other Ambulatory Visit: Payer: Self-pay | Admitting: Pharmacist Clinician (PhC)/ Clinical Pharmacy Specialist

## 2014-05-13 ENCOUNTER — Ambulatory Visit (INDEPENDENT_AMBULATORY_CARE_PROVIDER_SITE_OTHER): Payer: Medicare Other | Admitting: Pharmacist Clinician (PhC)/ Clinical Pharmacy Specialist

## 2014-05-13 DIAGNOSIS — Z954 Presence of other heart-valve replacement: Secondary | ICD-10-CM

## 2014-05-13 DIAGNOSIS — Z5181 Encounter for therapeutic drug level monitoring: Secondary | ICD-10-CM

## 2014-05-13 DIAGNOSIS — Z952 Presence of prosthetic heart valve: Secondary | ICD-10-CM

## 2014-05-13 LAB — PROTIME-INR
INR: 3.8 — ABNORMAL HIGH (ref 0.8–1.2)
Prothrombin Time: 40.3 s — ABNORMAL HIGH (ref 9.1–12.0)

## 2014-05-26 ENCOUNTER — Other Ambulatory Visit: Payer: Self-pay | Admitting: Pharmacist Clinician (PhC)/ Clinical Pharmacy Specialist

## 2014-05-27 ENCOUNTER — Ambulatory Visit (INDEPENDENT_AMBULATORY_CARE_PROVIDER_SITE_OTHER): Payer: Medicare Other | Admitting: Pharmacist Clinician (PhC)/ Clinical Pharmacy Specialist

## 2014-05-27 DIAGNOSIS — Z5181 Encounter for therapeutic drug level monitoring: Secondary | ICD-10-CM

## 2014-05-27 DIAGNOSIS — Z952 Presence of prosthetic heart valve: Secondary | ICD-10-CM

## 2014-05-27 DIAGNOSIS — Z954 Presence of other heart-valve replacement: Secondary | ICD-10-CM

## 2014-05-27 LAB — PROTIME-INR
INR: 3.1 — ABNORMAL HIGH (ref 0.8–1.2)
PROTHROMBIN TIME: 32.4 s — AB (ref 9.1–12.0)

## 2014-06-01 ENCOUNTER — Ambulatory Visit (INDEPENDENT_AMBULATORY_CARE_PROVIDER_SITE_OTHER): Payer: Medicare Other | Admitting: Cardiovascular Disease

## 2014-06-01 ENCOUNTER — Encounter: Payer: Self-pay | Admitting: Cardiovascular Disease

## 2014-06-01 VITALS — BP 126/68 | HR 79 | Ht 65.0 in | Wt 255.6 lb

## 2014-06-01 DIAGNOSIS — Z79899 Other long term (current) drug therapy: Secondary | ICD-10-CM

## 2014-06-01 DIAGNOSIS — Z954 Presence of other heart-valve replacement: Secondary | ICD-10-CM

## 2014-06-01 DIAGNOSIS — Z950 Presence of cardiac pacemaker: Secondary | ICD-10-CM | POA: Diagnosis not present

## 2014-06-01 DIAGNOSIS — I10 Essential (primary) hypertension: Secondary | ICD-10-CM | POA: Diagnosis not present

## 2014-06-01 DIAGNOSIS — I701 Atherosclerosis of renal artery: Secondary | ICD-10-CM | POA: Diagnosis not present

## 2014-06-01 DIAGNOSIS — E785 Hyperlipidemia, unspecified: Secondary | ICD-10-CM | POA: Diagnosis not present

## 2014-06-01 DIAGNOSIS — I6523 Occlusion and stenosis of bilateral carotid arteries: Secondary | ICD-10-CM | POA: Diagnosis not present

## 2014-06-01 DIAGNOSIS — Z952 Presence of prosthetic heart valve: Secondary | ICD-10-CM

## 2014-06-01 NOTE — Assessment & Plan Note (Signed)
History of symptomatic heart block status post permanent transvenous pacemaker insertion by Dr. Thompson Grayer which she follows.

## 2014-06-01 NOTE — Patient Instructions (Signed)
  We will see you back in follow up in 6 months with an extender and 1 year with Dr Gwenlyn Found.   Dr Gwenlyn Found has ordered: 1.  Echocardiogram (due in September). Echocardiography is a painless test that uses sound waves to create images of your heart. It provides your doctor with information about the size and shape of your heart and how well your heart's chambers and valves are working. This procedure takes approximately one hour. There are no restrictions for this procedure.   2. renal artery duplex- During this test, an ultrasound is used to evaluate blood flow to the kidneys. Allow one hour for this exam. Do not eat after midnight the day before and avoid carbonated beverages. Take your medications as you usually do.  3. A FASTING lipid profile: to be done at your convenience.  There is a Psychologist, forensic lab on the first floor of this building, [address].  They are open from 8am-5pm with a lunch from 12-2.  You do not need an appointment.

## 2014-06-01 NOTE — Assessment & Plan Note (Signed)
History of carotid artery disease with recent duplex evidence of an occluded left internal carotid artery with mild right ICA stenosis. We followed his annual basis.

## 2014-06-01 NOTE — Assessment & Plan Note (Signed)
History of hyperlipidemia on simvastatin 20 mg a day. We will recheck a lipid and liver profile

## 2014-06-01 NOTE — Assessment & Plan Note (Signed)
History of hypertension blood pressure measured at 126/68. She is not on any antihypertensive medications currently.

## 2014-06-01 NOTE — Progress Notes (Signed)
06/01/2014 [name]   August 31, 1950  [id number]  Primary Physician Carolee Rota, NP Primary Cardiologist: [name] Harp MD [name]: The patient is a 64 year old, moderately overweight, almost divorced Caucasian female mother of 47, grandmother to 2 grandchildren who has had a St. Jude AVR performed by Dr. Ceasar Mons September of 2006 because of severe aortic insufficiency. She had normal coronary arteries by cath at that time. A look at her renal arteries and demonstrated a 60% right renal artery stenosis and patent accessory left renal arteries. By duplex she does have a non-functioning left kidney. Her other problems include hypertension and hyperlipidemia. She also has a history of lupus followed by Dr. Jalene Mullet. An echo performed a year ago showed a well-functioning aortic prosthesis, and recent renal Dopplers revealed a widely patent right renal artery. She had a small bowel obstruction and underwent surgery at [facility] for perforated small bowel. She had an exploratory laparotomy with ileostomy and a right hemicolectomy 04/12/12. She saw [name] PAC back in our office 06/12/12 and her medicines were adjusted.  She was admitted in early January for symptomatic complete heart block underwent permanent pacemaker insertion by Dr. Thompson Grayer.[name] She was remitted approximately week later with orthostatic hypotension and was dehydrated. She responded to fluid resuscitation.  Since I saw her 6 months ago she's remained medically stable. Carotid Dopplers revealed an occluded left internal carotid artery with mild right ICA stenosis. She denies chest pain or shortness of breath.    Current Outpatient Prescriptions  Medication Sig Dispense Refill  . allopurinol (ZYLOPRIM) 100 MG tablet Take 1 tablet by mouth daily as needed (gout pain).   2  . aspirin EC 81 MG EC tablet Take 1 tablet (81 mg total) by mouth daily.    . Calcium Carbonate (CALTRATE 600) 1500 MG TABS  Take 1 tablet by mouth 2 (two) times daily.      . cholecalciferol (VITAMIN D) 1000 UNITS tablet Take 1,000 Units by mouth 2 (two) times daily.    . Fluticasone-Salmeterol (ADVAIR DISKUS) 250-50 MCG/DOSE AEPB Inhale 1 puff into the lungs 2 (two) times daily. 180 each 1  . furosemide (LASIX) 80 MG tablet Take 40 mg by mouth daily.     . hydroxychloroquine (PLAQUENIL) 200 MG tablet Take 1 tablet (200 mg total) by mouth 2 (two) times daily. 60 tablet 11  . loratadine (CLARITIN) 10 MG tablet Take 10 mg by mouth daily.    . Multiple Vitamins-Minerals (CENTRUM SILVER PO) Take 1 tablet by mouth daily.      . Omega-3 Fatty Acids (FISH OIL CONCENTRATE PO) Take 1 tablet by mouth daily.    . predniSONE (DELTASONE) 5 MG tablet Take 5 mg by mouth daily.      . Probiotic Product (PROBIOTIC DAILY PO) Take 1 tablet by mouth daily.    . simvastatin (ZOCOR) 20 MG tablet Take 1 tablet (20 mg total) by mouth at bedtime. 90 tablet 2  . warfarin (COUMADIN) 5 MG tablet Take 7.5-10 mg by mouth daily. On Wednesday take 10mg . All other days take 7.5mg      No current facility-administered medications for this visit.    No Known Allergies  History   Social History  . Marital Status: Divorced    Spouse Name: N/A  . Number of Children: 3  . Years of Education: N/A   Occupational History  . Not on file.   Social History Main Topics  . Smoking status: Never Smoker   .  Smokeless tobacco: Never Used  . Alcohol Use: No  . Drug Use: No  . Sexual Activity: Not on file   Other Topics Concern  . Not on file   Social History Narrative     Review of Systems: General: negative for chills, fever, night sweats or weight changes.  Cardiovascular: negative for chest pain, dyspnea on exertion, edema, orthopnea, palpitations, paroxysmal nocturnal dyspnea or shortness of breath Dermatological: negative for rash Respiratory: negative for cough or wheezing Urologic: negative for hematuria Abdominal: negative for  nausea, vomiting, diarrhea, bright red blood per rectum, melena, or hematemesis Neurologic: negative for visual changes, syncope, or dizziness All other systems reviewed and are otherwise negative except as noted above.    Blood pressure 126/68, pulse 79, height 5\' 5"  (1.651 m), weight 255 lb 9.6 oz (115.939 kg).  General appearance: alert and no distress Neck: no adenopathy, no carotid bruit, no JVD, supple, symmetrical, trachea midline and thyroid not enlarged, symmetric, no tenderness/mass/nodules Lungs: clear to auscultation bilaterally Heart: Crisp prosthetic valve sounds Extremities: extremities normal, atraumatic, no cyanosis or edema  EKG normal sinus rhythm at 79 with right bundle branch block and left axis deviation. I personally reviewed his EKG  ASSESSMENT AND PLAN:   S/P AVR (aortic valve replacement) Status post St. Jude aortic valve replacement by Dr. [name] September 2006 for severe aortic insufficiency. Her coronary arteries at that time were normal. We've been following her by 2-D echo most recently performed in September of last year which showed a well-functioning aortic mechanical prosthesis. Her EF was measured at 35-40% however upon further review this was reassessed at 50%.   Renal artery stenosis History of an occluded left renal artery with mild to moderate right renal artery stenosis which we are following by duplex ultrasound.   Hyperlipidemia History of hyperlipidemia on simvastatin 20 mg a day. We will recheck a lipid and liver profile   Essential hypertension History of hypertension blood pressure measured at 126/68. She is not on any antihypertensive medications currently.   Complete heart block History of symptomatic heart block status post permanent transvenous pacemaker insertion by Dr. Thompson Grayer which she follows.   Carotid stenosis History of carotid artery disease with recent duplex evidence of an occluded left internal carotid artery  with mild right ICA stenosis. We followed his annual basis.       [name] Harp MD FACP,FACC,FAHA, [facility] 06/01/2014 3:03 PM

## 2014-06-01 NOTE — Assessment & Plan Note (Signed)
Status post St. Jude aortic valve replacement by Dr. [name] September 2006 for severe aortic insufficiency. Her coronary arteries at that time were normal. We've been following her by 2-D echo most recently performed in September of last year which showed a well-functioning aortic mechanical prosthesis. Her EF was measured at 35-40% however upon further review this was reassessed at 50%.

## 2014-06-01 NOTE — Assessment & Plan Note (Signed)
History of an occluded left renal artery with mild to moderate right renal artery stenosis which we are following by duplex ultrasound.

## 2014-06-02 ENCOUNTER — Other Ambulatory Visit: Payer: Self-pay | Admitting: Cardiovascular Disease

## 2014-06-03 LAB — HEPATIC FUNCTION PANEL
ALBUMIN: 4.1 g/dL (ref 3.6–4.8)
ALT: 26 IU/L (ref 0–32)
AST: 29 IU/L (ref 0–40)
Alkaline Phosphatase: 104 IU/L (ref 39–117)
Bilirubin Total: 0.5 mg/dL (ref 0.0–1.2)
Bilirubin, Direct: 0.2 mg/dL (ref 0.00–0.40)
Total Protein: 7.2 g/dL (ref 6.0–8.5)

## 2014-06-03 LAB — LIPID PANEL W/O CHOL/HDL RATIO
Cholesterol, Total: 157 mg/dL (ref 100–199)
HDL: 101 mg/dL (ref >39)
LDL Calculated: 40 mg/dL (ref 0–99)
TRIGLYCERIDES: 78 mg/dL (ref 0–149)
VLDL Cholesterol Cal: 16 mg/dL (ref 5–40)

## 2014-06-03 LAB — AMBIG ABBREV LP DEFAULT

## 2014-06-03 LAB — AMBIG ABBREV HFP7 DEFAULT

## 2014-06-07 ENCOUNTER — Encounter: Payer: Self-pay | Admitting: *Deleted

## 2014-06-10 ENCOUNTER — Ambulatory Visit (HOSPITAL_COMMUNITY)
Admission: RE | Admit: 2014-06-10 | Discharge: 2014-06-10 | Disposition: A | Discharge status: Home / Self Care | Payer: Medicare Other | Source: Ambulatory Visit | Attending: Cardiovascular Disease | Admitting: Cardiovascular Disease

## 2014-06-10 DIAGNOSIS — I701 Atherosclerosis of renal artery: Secondary | ICD-10-CM | POA: Diagnosis not present

## 2014-06-10 NOTE — Progress Notes (Signed)
Renal artery duplex completed.  °Brianna L Mazza,RVT °

## 2014-06-15 ENCOUNTER — Telehealth: Payer: Self-pay | Admitting: *Deleted

## 2014-06-15 DIAGNOSIS — I701 Atherosclerosis of renal artery: Secondary | ICD-10-CM

## 2014-06-15 NOTE — Telephone Encounter (Signed)
Renal Doppler ordered for 6 month recheck.

## 2014-06-15 NOTE — Telephone Encounter (Signed)
-----   Message from [name], MD sent at 06/15/2014  3:04 PM EDT ----- No change from prior study. Repeat in 6 months

## 2014-06-16 ENCOUNTER — Other Ambulatory Visit: Payer: Self-pay | Admitting: Pharmacist Clinician (PhC)/ Clinical Pharmacy Specialist

## 2014-06-17 LAB — PROTIME-INR
INR: 3 — ABNORMAL HIGH (ref 0.8–1.2)
Prothrombin Time: 31 s — ABNORMAL HIGH (ref 9.1–12.0)

## 2014-06-18 ENCOUNTER — Ambulatory Visit (INDEPENDENT_AMBULATORY_CARE_PROVIDER_SITE_OTHER): Payer: Medicare Other | Admitting: Pharmacist Clinician (PhC)/ Clinical Pharmacy Specialist

## 2014-06-18 DIAGNOSIS — Z5181 Encounter for therapeutic drug level monitoring: Secondary | ICD-10-CM

## 2014-06-18 DIAGNOSIS — Z952 Presence of prosthetic heart valve: Secondary | ICD-10-CM

## 2014-06-18 DIAGNOSIS — Z954 Presence of other heart-valve replacement: Secondary | ICD-10-CM

## 2014-06-21 ENCOUNTER — Other Ambulatory Visit: Payer: Self-pay | Admitting: Cardiovascular Disease

## 2014-07-09 ENCOUNTER — Other Ambulatory Visit: Payer: Self-pay

## 2014-07-12 ENCOUNTER — Encounter: Payer: Self-pay | Admitting: Internal Medicine

## 2014-07-12 ENCOUNTER — Ambulatory Visit (INDEPENDENT_AMBULATORY_CARE_PROVIDER_SITE_OTHER): Payer: Medicare Other | Admitting: Internal Medicine

## 2014-07-12 VITALS — BP 140/74 | HR 79 | Ht 65.0 in | Wt 258.4 lb

## 2014-07-12 DIAGNOSIS — R001 Bradycardia, unspecified: Secondary | ICD-10-CM

## 2014-07-12 DIAGNOSIS — I442 Atrioventricular block, complete: Secondary | ICD-10-CM

## 2014-07-12 DIAGNOSIS — Z950 Presence of cardiac pacemaker: Secondary | ICD-10-CM | POA: Diagnosis not present

## 2014-07-12 LAB — CUP PACEART INCLINIC DEVICE CHECK
Battery Voltage: 3.02 V
Battery Voltage: 3.02 V
Brady Statistic RA Percent Paced: 0.84 %
Brady Statistic RA Percent Paced: 0.84 %
Brady Statistic RV Percent Paced: 78 %
Brady Statistic RV Percent Paced: 78 %
Date Time Interrogation Session: 20160523164615
Date Time Interrogation Session: 20160523164615
Lead Channel Impedance Value: 450 Ohm
Lead Channel Impedance Value: 612.5 Ohm
Lead Channel Pacing Threshold Amplitude: 0.75 V
Lead Channel Pacing Threshold Amplitude: 0.75 V
Lead Channel Pacing Threshold Amplitude: 0.75 V
Lead Channel Pacing Threshold Amplitude: 0.75 V
Lead Channel Pacing Threshold Amplitude: 0.75 V
Lead Channel Pacing Threshold Pulse Width: 0.4 ms
Lead Channel Pacing Threshold Pulse Width: 0.4 ms
Lead Channel Pacing Threshold Pulse Width: 0.4 ms
Lead Channel Pacing Threshold Pulse Width: 0.4 ms
Lead Channel Sensing Intrinsic Amplitude: 12 mV
Lead Channel Sensing Intrinsic Amplitude: 5 mV
Lead Channel Setting Pacing Amplitude: 2 V
Lead Channel Setting Pacing Pulse Width: 0.4 ms
Lead Channel Setting Sensing Sensitivity: 2 mV
Lead Channel Setting Sensing Sensitivity: 2 mV
MDC IDC MSMT BATTERY REMAINING LONGEVITY: 135.6 mo
MDC IDC MSMT BATTERY REMAINING LONGEVITY: 135.6 mo
MDC IDC MSMT LEADCHNL RA IMPEDANCE VALUE: 450 Ohm
MDC IDC MSMT LEADCHNL RA PACING THRESHOLD PULSEWIDTH: 0.4 ms
MDC IDC MSMT LEADCHNL RA PACING THRESHOLD PULSEWIDTH: 0.4 ms
MDC IDC MSMT LEADCHNL RA SENSING INTR AMPL: 5 mV
MDC IDC MSMT LEADCHNL RV IMPEDANCE VALUE: 612.5 Ohm
MDC IDC MSMT LEADCHNL RV PACING THRESHOLD AMPLITUDE: 0.75 V
MDC IDC MSMT LEADCHNL RV SENSING INTR AMPL: 12 mV
MDC IDC PG SERIAL: 7587004
MDC IDC SET LEADCHNL RA PACING AMPLITUDE: 2 V
MDC IDC SET LEADCHNL RV PACING AMPLITUDE: 1 V
MDC IDC SET LEADCHNL RV PACING AMPLITUDE: 1 V
MDC IDC SET LEADCHNL RV PACING PULSEWIDTH: 0.4 ms
Pulse Gen Model: 2240
Pulse Gen Model: 2240
Pulse Gen Serial Number: 7587004

## 2014-07-12 NOTE — Progress Notes (Signed)
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 She is doing well.  She has rare dizziness.  She has occasional fatigue.   Today, she denies symptoms of palpitations, chest pain,  dizziness, presyncope, or syncope.  The patient is otherwise without complaint today.   Past Medical History  Diagnosis Date  . Asthma   . Lupus (systemic lupus erythematosus)   . PVD (peripheral vascular disease)     60% R renal artery stenosis; non-functioning L kidney  . Hypertension   . Dyslipidemia   . Chronic anticoagulation   . History of echocardiogram 11/08/2011    EF >55%; mild-mod concentric LVH; trace aortic regurg.; LA mild-mod dilated  . History of nuclear stress test 01/20/2010    normal pattern of perfusion; low risk scan  . H/O Doppler ultrasound 10/03/2010    carotid duplex - R ICA normal patency; L CCA-ICA bypass gradt patent common to interal cartoid bypass graft   . H/O mechanical aortic valve replacement   . Complete heart block 1//2015    Implantation of a dual chamber pacemaker on 03-06-2013 by Dr Rayann Heman.  The patient received a STJ model number [id number] pacemaker with model number [id number] right ventricular lead.   . Carotid artery disease    Past Surgical History  Procedure Laterality Date  . Aortic valve replacement  11/29/006    [facility]; St. Jude; on coumadin  . Exploratory laparotomy  04/12/2012    small bowel perf; ileostomy & R hemicolectomy   . Tubal ligation  11/1982  . Cholecystectomy  1999  . Cardiac catheterization  11/01/2004    define anatomy   . Carotid endarterectomy      lt.  . Pacemaker insertion  03/06/2013    SJM Assurity DR PPM implanted by Dr Rayann Heman for complete heart block  . Permanent pacemaker insertion N/A 03/06/2013    Procedure: PERMANENT PACEMAKER INSERTION;  Surgeon: Coralyn Mark, MD;  Location: [place] CATH LAB;  Service: Cardiovascular;  Laterality: N/A;  .  Temporary pacemaker insertion N/A 03/06/2013    Procedure: TEMPORARY PACEMAKER INSERTION;  Surgeon: Coralyn Mark, MD;  Location: [place] CATH LAB;  Service: Cardiovascular;  Laterality: N/A;    Current Outpatient Prescriptions  Medication Sig Dispense Refill  . aspirin EC 81 MG EC tablet Take 1 tablet (81 mg total) by mouth daily.    . Calcium Carbonate (CALTRATE 600) 1500 MG TABS Take 1 tablet by mouth 2 (two) times daily.      . cholecalciferol (VITAMIN D) 1000 UNITS tablet Take 1,000 Units by mouth 2 (two) times daily.    . Fluticasone-Salmeterol (ADVAIR DISKUS) 250-50 MCG/DOSE AEPB Inhale 1 puff into the lungs 2 (two) times daily. 180 each 1  . furosemide (LASIX) 80 MG tablet Take 40 mg by mouth daily.     . hydroxychloroquine (PLAQUENIL) 200 MG tablet Take 1 tablet (200 mg total) by mouth 2 (two) times daily. 60 tablet 11  . loratadine (CLARITIN) 10 MG tablet Take 10 mg by mouth daily.    . predniSONE (DELTASONE) 5 MG tablet Take 5 mg by mouth daily.      . Probiotic Product (PROBIOTIC DAILY PO) Take 1 tablet by mouth daily.    . simvastatin (ZOCOR) 20 MG tablet Take 1 tablet (20 mg total) by mouth at bedtime. 90 tablet 2  . warfarin (COUMADIN) 5 MG tablet Take 1 to 1.5 tablets by mouth daily as  directed by coumadin clinic 120 tablet 1   No current facility-administered medications for this visit.    Physical Exam: Filed Vitals:   07/12/14 1235  BP: 140/74  Pulse: 79  Height: 5\' 5"  (1.651 m)  Weight: 117.209 kg (258 lb 6.4 oz)    GEN- The patient is well appearing, alert and oriented x 3 today.   Head- normocephalic, atraumatic Eyes-  Sclera clear, conjunctiva pink Ears- hearing intact Oropharynx- clear Lungs- few bibasilar rales, normal work of breathing Chest- pacemaker pocket is well healed Heart- Regular rate and rhythm, mechanical S2 GI- soft, NT, ND, + BS Extremities- no clubbing, cyanosis,trace edema  Pacemaker interrogation- reviewed in detail today,  See PACEART  report  Assessment and Plan:  1. Transient complete heart block Normal pacemaker function See Pace Art report Will shorten AV from 350 (w VIP) to 180/160 msec today.  She was 78% V paced with AV of 350 msec previously  2. Chronic diastolic dysfunction EF [id number] No changes  3. S/p mechanical AVR Continue coumadin  Merlin compliance encouraged Return to see EP NP in the device clinic in 1 year

## 2014-07-12 NOTE — Patient Instructions (Signed)
Medication Instructions:  Your physician recommends that you continue on your current medications as directed. Please refer to the Current Medication list given to you today.   Labwork: None ordered  Testing/Procedures: None ordered  Follow-Up: Your physician wants you to follow-up in: 12 months with Chanetta Marshall, NP You will receive a reminder letter in the mail two months in advance. If you don't receive a letter, please call our office to schedule the follow-up appointment.   Remote monitoring is used to monitor your Pacemaker or ICD from home. This monitoring reduces the number of office visits required to check your device to one time per year. It allows [place] to keep an eye on the functioning of your device to ensure it is working properly. You are scheduled for a device check from home on 10/11/14. You may send your transmission at any time that day. If you have a wireless device, the transmission will be sent automatically. After your physician reviews your transmission, you will receive a postcard with your next transmission date.    Any Other Special Instructions Will Be Listed Below (If Applicable).

## 2014-07-14 ENCOUNTER — Other Ambulatory Visit: Payer: Self-pay | Admitting: Cardiovascular Disease

## 2014-07-14 LAB — PROTIME-INR: INR: 3 — AB (ref 0.9–1.1)

## 2014-07-15 ENCOUNTER — Ambulatory Visit (INDEPENDENT_AMBULATORY_CARE_PROVIDER_SITE_OTHER): Payer: Medicare Other | Admitting: Pharmacist Clinician (PhC)/ Clinical Pharmacy Specialist

## 2014-07-15 DIAGNOSIS — Z954 Presence of other heart-valve replacement: Secondary | ICD-10-CM

## 2014-07-15 DIAGNOSIS — Z5181 Encounter for therapeutic drug level monitoring: Secondary | ICD-10-CM

## 2014-07-15 DIAGNOSIS — Z952 Presence of prosthetic heart valve: Secondary | ICD-10-CM

## 2014-07-15 LAB — PROTIME-INR
INR: 3 — ABNORMAL HIGH (ref 0.8–1.2)
Prothrombin Time: 31 s — ABNORMAL HIGH (ref 9.1–12.0)

## 2014-07-21 ENCOUNTER — Telehealth: Payer: Self-pay | Admitting: *Deleted

## 2014-07-21 NOTE — Telephone Encounter (Signed)
Pt "feels fine" since programming changes. States she is sleeping better. States she definitely does not feel worse. States she feels slightly better with energy level. Pt aware to contact device clinic if she notices new symptoms.   Merlin remote 10/11/14 & ROV w/ Amber next year.

## 2014-08-17 ENCOUNTER — Other Ambulatory Visit: Payer: Self-pay | Admitting: Cardiovascular Disease

## 2014-08-18 ENCOUNTER — Ambulatory Visit (INDEPENDENT_AMBULATORY_CARE_PROVIDER_SITE_OTHER): Payer: Medicare Other | Admitting: Pharmacist Clinician (PhC)/ Clinical Pharmacy Specialist

## 2014-08-18 DIAGNOSIS — Z952 Presence of prosthetic heart valve: Secondary | ICD-10-CM

## 2014-08-18 DIAGNOSIS — Z954 Presence of other heart-valve replacement: Secondary | ICD-10-CM

## 2014-08-18 DIAGNOSIS — Z5181 Encounter for therapeutic drug level monitoring: Secondary | ICD-10-CM

## 2014-08-18 LAB — PROTIME-INR
INR: 2.3 — AB (ref 0.8–1.2)
Prothrombin Time: 23.7 s — ABNORMAL HIGH (ref 9.1–12.0)

## 2014-08-30 ENCOUNTER — Encounter: Payer: Self-pay | Admitting: *Deleted

## 2014-09-08 ENCOUNTER — Other Ambulatory Visit: Payer: Self-pay | Admitting: Cardiovascular Disease

## 2014-09-09 ENCOUNTER — Ambulatory Visit (INDEPENDENT_AMBULATORY_CARE_PROVIDER_SITE_OTHER): Payer: Medicare Other | Admitting: Pharmacist Clinician (PhC)/ Clinical Pharmacy Specialist

## 2014-09-09 DIAGNOSIS — Z952 Presence of prosthetic heart valve: Secondary | ICD-10-CM

## 2014-09-09 DIAGNOSIS — Z5181 Encounter for therapeutic drug level monitoring: Secondary | ICD-10-CM

## 2014-09-09 DIAGNOSIS — Z954 Presence of other heart-valve replacement: Secondary | ICD-10-CM

## 2014-09-09 LAB — PROTIME-INR
INR: 2.9 — ABNORMAL HIGH (ref 0.8–1.2)
Prothrombin Time: 30.4 s — ABNORMAL HIGH (ref 9.1–12.0)

## 2014-09-20 ENCOUNTER — Telehealth: Payer: Self-pay | Admitting: Cardiovascular Disease

## 2014-09-20 MED ORDER — WARFARIN SODIUM 5 MG PO TABS: ORAL_TABLET | ORAL | Status: DC

## 2014-09-20 NOTE — Telephone Encounter (Signed)
°  1. Which medications need to be refilled? Warfarin  2. Which pharmacy is medication to be sent to?[facility]  3. Do they need a 30 day or 90 day supply? 90 days and refills  4. Would they like a call back once the medication has been sent to the pharmacy? no

## 2014-10-11 ENCOUNTER — Ambulatory Visit (INDEPENDENT_AMBULATORY_CARE_PROVIDER_SITE_OTHER): Payer: Medicare Other | Admitting: *Deleted

## 2014-10-11 ENCOUNTER — Encounter: Payer: Self-pay | Admitting: Internal Medicine

## 2014-10-11 DIAGNOSIS — I442 Atrioventricular block, complete: Secondary | ICD-10-CM

## 2014-10-12 LAB — PROTIME-INR: INR: 2.4 — AB (ref 0.9–1.1)

## 2014-10-12 NOTE — Progress Notes (Signed)
Remote pacemaker transmission.   

## 2014-10-13 ENCOUNTER — Ambulatory Visit (INDEPENDENT_AMBULATORY_CARE_PROVIDER_SITE_OTHER): Payer: Medicare Other | Admitting: Pharmacist Clinician (PhC)/ Clinical Pharmacy Specialist

## 2014-10-13 DIAGNOSIS — Z954 Presence of other heart-valve replacement: Secondary | ICD-10-CM

## 2014-10-13 DIAGNOSIS — Z952 Presence of prosthetic heart valve: Secondary | ICD-10-CM

## 2014-10-13 DIAGNOSIS — Z5181 Encounter for therapeutic drug level monitoring: Secondary | ICD-10-CM

## 2014-10-15 LAB — CUP PACEART REMOTE DEVICE CHECK
Battery Remaining Longevity: 129 mo
Battery Remaining Percentage: 95.5 %
Battery Voltage: 3.01 V
Brady Statistic AP VP Percent: 1 %
Brady Statistic RA Percent Paced: 1 %
Brady Statistic RV Percent Paced: 99 %
Lead Channel Impedance Value: 630 Ohm
Lead Channel Pacing Threshold Amplitude: 0.75 V
Lead Channel Pacing Threshold Pulse Width: 0.4 ms
Lead Channel Sensing Intrinsic Amplitude: 5 mV
Lead Channel Setting Pacing Amplitude: 2 V
Lead Channel Setting Pacing Pulse Width: 0.4 ms
MDC IDC MSMT LEADCHNL RA IMPEDANCE VALUE: 460 Ohm
MDC IDC MSMT LEADCHNL RV PACING THRESHOLD AMPLITUDE: 1.25 V
MDC IDC MSMT LEADCHNL RV PACING THRESHOLD PULSEWIDTH: 0.4 ms
MDC IDC MSMT LEADCHNL RV SENSING INTR AMPL: 12 mV
MDC IDC SESS DTM: 20160822070346
MDC IDC SET LEADCHNL RV PACING AMPLITUDE: 1.5 V
MDC IDC SET LEADCHNL RV SENSING SENSITIVITY: 2 mV
MDC IDC STAT BRADY AP VS PERCENT: 1 %
MDC IDC STAT BRADY AS VP PERCENT: 99 %
MDC IDC STAT BRADY AS VS PERCENT: 1 %
Pulse Gen Model: 2240
Pulse Gen Serial Number: 7587004

## 2014-10-22 ENCOUNTER — Encounter: Payer: Self-pay | Admitting: Cardiology

## 2014-10-29 ENCOUNTER — Telehealth: Payer: Self-pay | Admitting: Cardiovascular Disease

## 2014-10-29 NOTE — Telephone Encounter (Signed)
New Prob   Calling to verify diagnosis of heart failure and requesting most recent EF. Please call.

## 2014-10-29 NOTE — Telephone Encounter (Signed)
LM w/ info. Advised to call back if needed.

## 2014-11-03 ENCOUNTER — Other Ambulatory Visit: Payer: Self-pay | Admitting: Cardiovascular Disease

## 2014-11-03 LAB — PROTIME-INR: INR: 2.9 — AB (ref <=1.1)

## 2014-11-04 ENCOUNTER — Ambulatory Visit (INDEPENDENT_AMBULATORY_CARE_PROVIDER_SITE_OTHER): Payer: Medicare Other | Admitting: Pharmacist Clinician (PhC)/ Clinical Pharmacy Specialist

## 2014-11-04 DIAGNOSIS — Z954 Presence of other heart-valve replacement: Secondary | ICD-10-CM

## 2014-11-04 DIAGNOSIS — Z5181 Encounter for therapeutic drug level monitoring: Secondary | ICD-10-CM

## 2014-11-04 DIAGNOSIS — Z952 Presence of prosthetic heart valve: Secondary | ICD-10-CM

## 2014-11-04 LAB — PROTIME-INR
INR: 2.9 — AB (ref 0.8–1.2)
PROTHROMBIN TIME: 29.8 s — AB (ref 9.1–12.0)

## 2014-11-09 ENCOUNTER — Other Ambulatory Visit: Payer: Self-pay | Admitting: Internal Medicine

## 2014-12-08 ENCOUNTER — Other Ambulatory Visit: Payer: Self-pay | Admitting: Cardiovascular Disease

## 2014-12-08 LAB — PROTIME-INR: INR: 3.1 — AB (ref <=1.1)

## 2014-12-09 ENCOUNTER — Ambulatory Visit (INDEPENDENT_AMBULATORY_CARE_PROVIDER_SITE_OTHER): Payer: Medicare Other | Admitting: Cardiology

## 2014-12-09 ENCOUNTER — Encounter: Payer: Self-pay | Admitting: Cardiology

## 2014-12-09 ENCOUNTER — Ambulatory Visit (INDEPENDENT_AMBULATORY_CARE_PROVIDER_SITE_OTHER): Payer: Medicare Other | Admitting: Pharmacist Clinician (PhC)/ Clinical Pharmacy Specialist

## 2014-12-09 VITALS — BP 136/80 | HR 80 | Ht 65.0 in | Wt 264.4 lb

## 2014-12-09 DIAGNOSIS — Z950 Presence of cardiac pacemaker: Secondary | ICD-10-CM

## 2014-12-09 DIAGNOSIS — IMO0001 Reserved for inherently not codable concepts without codable children: Secondary | ICD-10-CM | POA: Insufficient documentation

## 2014-12-09 DIAGNOSIS — Z0389 Encounter for observation for other suspected diseases and conditions ruled out: Secondary | ICD-10-CM | POA: Diagnosis not present

## 2014-12-09 DIAGNOSIS — Z5181 Encounter for therapeutic drug level monitoring: Secondary | ICD-10-CM

## 2014-12-09 DIAGNOSIS — Z954 Presence of other heart-valve replacement: Secondary | ICD-10-CM

## 2014-12-09 DIAGNOSIS — Z23 Encounter for immunization: Secondary | ICD-10-CM

## 2014-12-09 DIAGNOSIS — I10 Essential (primary) hypertension: Secondary | ICD-10-CM | POA: Diagnosis not present

## 2014-12-09 DIAGNOSIS — M329 Systemic lupus erythematosus, unspecified: Secondary | ICD-10-CM

## 2014-12-09 DIAGNOSIS — I701 Atherosclerosis of renal artery: Secondary | ICD-10-CM

## 2014-12-09 DIAGNOSIS — Z952 Presence of prosthetic heart valve: Secondary | ICD-10-CM

## 2014-12-09 LAB — PROTIME-INR
INR: 3.1 — ABNORMAL HIGH (ref 0.8–1.2)
PROTHROMBIN TIME: 31.8 s — AB (ref 9.1–12.0)

## 2014-12-09 NOTE — Patient Instructions (Signed)
Your physician wants you to follow-up in: 6 months or sooner if needed with dr. Gwenlyn Found. You will receive a reminder letter in the mail two months in advance. If you don't receive a letter, please call our office to schedule the follow-up appointment.  Your physician has recommended you make the following change in your medication: STOP your aspirin.  If you need a refill on your cardiac medications before your next appointment, please call your pharmacy.  Influenza Virus Vaccine (Flucelvax) What is this medicine? INFLUENZA VIRUS VACCINE (in floo EN zuh VAHY ruhs vak SEEN) helps to reduce the risk of getting influenza also known as the flu. The vaccine only helps protect you against some strains of the flu. This medicine may be used for other purposes; ask your health care provider or pharmacist if you have questions. What should I tell my health care provider before I take this medicine? They need to know if you have any of these conditions: -bleeding disorder like hemophilia -fever or infection -Guillain-Barre syndrome or other neurological problems -immune system problems -infection with the human immunodeficiency virus (HIV) or AIDS -low blood platelet counts -multiple sclerosis -an unusual or allergic reaction to influenza virus vaccine, other medicines, foods, dyes or preservatives -pregnant or trying to get pregnant -breast-feeding How should I use this medicine? This vaccine is for injection into a muscle. It is given by a health care professional. A copy of Vaccine Information Statements will be given before each vaccination. Read this sheet carefully each time. The sheet may change frequently. Talk to your pediatrician regarding the use of this medicine in children. Special care may be needed. Overdosage: If you think you've taken too much of this medicine contact a poison control center or emergency room at once. Overdosage: If you think you have taken too much of this medicine  contact a poison control center or emergency room at once. NOTE: This medicine is only for you. Do not share this medicine with others. What if I miss a dose? This does not apply. What may interact with this medicine? -chemotherapy or radiation therapy -medicines that lower your immune system like etanercept, anakinra, infliximab, and adalimumab -medicines that treat or prevent blood clots like warfarin -phenytoin -steroid medicines like prednisone or cortisone -theophylline -vaccines This list may not describe all possible interactions. Give your health care provider a list of all the medicines, herbs, non-prescription drugs, or dietary supplements you use. Also tell them if you smoke, drink alcohol, or use illegal drugs. Some items may interact with your medicine. What should I watch for while using this medicine? Report any side effects that do not go away within 3 days to your doctor or health care professional. Call your health care provider if any unusual symptoms occur within 6 weeks of receiving this vaccine. You may still catch the flu, but the illness is not usually as bad. You cannot get the flu from the vaccine. The vaccine will not protect against colds or other illnesses that may cause fever. The vaccine is needed every year. What side effects may I notice from receiving this medicine? Side effects that you should report to your doctor or health care professional as soon as possible: -allergic reactions like skin rash, itching or hives, swelling of the face, lips, or tongue Side effects that usually do not require medical attention (Report these to your doctor or health care professional if they continue or are bothersome.): -fever -headache -muscle aches and pains -pain, tenderness, redness, or swelling at  the injection site -tiredness This list may not describe all possible side effects. Call your doctor for medical advice about side effects. You may report side effects to  FDA at 1-800-FDA-1088. Where should I keep my medicine? The vaccine will be given by a health care professional in a clinic, pharmacy, doctor's office, or other health care setting. You will not be given vaccine doses to store at home. NOTE: This sheet is a summary. It may not cover all possible information. If you have questions about this medicine, talk to your doctor, pharmacist, or health care provider.    2016, Elsevier/Gold Standard. (2011-01-17 14:06:47)

## 2014-12-09 NOTE — Progress Notes (Signed)
12/09/2014 [name]   05/16/1950  [id number]  Primary Physician [name], MD Primary Cardiologist: Dr Gwenlyn Found  HPI:  Pleasant morbidly obese Caucasian female followed by Dr Gwenlyn Found here for a 6 month check up. She had a St Jude AVR in 2006. She had normal coronaries then and had a low risk Myoview in 2011. An echo done in Sept 2015 did show an EF of 35% but this was reviewed with Dr Stanford Breed and.the consensus was that the left ventricular ejection fraction was not 35% but more like 50%. The velocities across the valve did not appear to be significantly changed from prior echo.          She did have a pacemaker placed in Jan 2015 for CHB. In addition she has PVD with a 60% Rt RA narrowing and non functioning Lt kidney. Dopplers in April 2016 show the Rt RA to be patent. She has an occluded LICA, patent RICA.    Current Outpatient Prescriptions  Medication Sig Dispense Refill  . ADVAIR DISKUS 250-50 MCG/DOSE AEPB Use 1 inhalation by mouth  two times daily 180 each 1  . allopurinol (ZYLOPRIM) 100 MG tablet Take 1 tablet by mouth daily.  3  . Calcium Carbonate (CALTRATE 600) 1500 MG TABS Take 1 tablet by mouth 2 (two) times daily.      . cholecalciferol (VITAMIN D) 1000 UNITS tablet Take 1,000 Units by mouth 2 (two) times daily.    . furosemide (LASIX) 80 MG tablet Take 40 mg by mouth daily.     . hydroxychloroquine (PLAQUENIL) 200 MG tablet Take 1 tablet (200 mg total) by mouth 2 (two) times daily. 60 tablet 11  . loratadine (CLARITIN) 10 MG tablet Take 10 mg by mouth daily.    . predniSONE (DELTASONE) 5 MG tablet Take 5 mg by mouth daily.      . Probiotic Product (PROBIOTIC DAILY PO) Take 1 tablet by mouth daily.    . simvastatin (ZOCOR) 20 MG tablet Take 1 tablet (20 mg total) by mouth at bedtime. 90 tablet 2  . warfarin (COUMADIN) 5 MG tablet Take 1 to 1.5 tablets by mouth daily as directed by coumadin clinic 120 tablet 1   No current facility-administered medications for this  visit.    No Known Allergies  Social History   Social History  . Marital Status: Divorced    Spouse Name: N/A  . Number of Children: 3  . Years of Education: N/A   Occupational History  . Not on file.   Social History Main Topics  . Smoking status: Never Smoker   . Smokeless tobacco: Never Used  . Alcohol Use: No  . Drug Use: No  . Sexual Activity: Not on file   Other Topics Concern  . Not on file   Social History Narrative     Review of Systems: General: negative for chills, fever, night sweats or weight changes.  Cardiovascular: negative for chest pain, dyspnea on exertion, edema, orthopnea, palpitations, paroxysmal nocturnal dyspnea or shortness of breath Dermatological: negative for rash Respiratory: negative for cough or wheezing Urologic: negative for hematuria Abdominal: negative for nausea, vomiting, diarrhea, bright red blood per rectum, melena, or hematemesis Neurologic: negative for visual changes, syncope, or dizziness All other systems reviewed and are otherwise negative except as noted above.    Blood pressure 136/80, pulse 80, height 5\' 5"  (1.651 m), weight 264 lb 6.4 oz (119.931 kg).  General appearance: alert, cooperative, no distress and moderately obese Neck: no  JVD and bilateral CA bruit Lungs: few basilar crackles Heart: regular rate and rhythm and 2/6 systolic murmur, positive valve sounds Extremities: no edema Neurologic: Grossly normal  EKG V paced  ASSESSMENT AND PLAN:   S/P AVR (aortic valve replacement) St Jude AVR 2006  Normal coronary arteries 2006, negative Myoview 2011  Cardiac pacemaker in situ Implanted Jan 2015 for CHB  Essential hypertension Controlled  Renal artery stenosis 60% Rt RAS, non functioning Lt kidney, dopplers show patent Rt RA April 2016  LUPUS (SLE) On steroids and Plaquinil  Morbid obesity (HCC) BMI 44. Pt declined sleep study   PLAN  I discussed ASA and Coumadin use with Dr Sallyanne Kuster. The pt  says ASA was started after she had her pacemaker. She has normal coronaries and is on Coumadin for her AVR so ASA was stopped today. Dr Gwenlyn Found did want an echo repeated and this will be ordered.   Rashanda Magloire K PA-C 12/09/2014 11:03 AM

## 2014-12-09 NOTE — Assessment & Plan Note (Addendum)
Implanted Jan 2015 for CHB

## 2014-12-09 NOTE — Assessment & Plan Note (Signed)
Controlled.  

## 2014-12-09 NOTE — Assessment & Plan Note (Signed)
On steroids and Plaquinil

## 2014-12-09 NOTE — Assessment & Plan Note (Signed)
2006, negative Myoview 2011

## 2014-12-09 NOTE — Assessment & Plan Note (Signed)
BMI 44. Pt declined sleep study

## 2014-12-09 NOTE — Assessment & Plan Note (Signed)
60% Rt RAS, non functioning Lt kidney, dopplers show patent Rt RA April 2016

## 2014-12-09 NOTE — Assessment & Plan Note (Signed)
St Jude AVR 2006

## 2014-12-20 ENCOUNTER — Ambulatory Visit (HOSPITAL_COMMUNITY): Payer: Medicare Other | Attending: Internal Medicine

## 2014-12-20 ENCOUNTER — Other Ambulatory Visit: Payer: Self-pay

## 2014-12-20 DIAGNOSIS — I071 Rheumatic tricuspid insufficiency: Secondary | ICD-10-CM | POA: Insufficient documentation

## 2014-12-20 DIAGNOSIS — I5189 Other ill-defined heart diseases: Secondary | ICD-10-CM | POA: Insufficient documentation

## 2014-12-20 DIAGNOSIS — Z952 Presence of prosthetic heart valve: Secondary | ICD-10-CM | POA: Diagnosis present

## 2014-12-20 DIAGNOSIS — Z954 Presence of other heart-valve replacement: Secondary | ICD-10-CM | POA: Diagnosis not present

## 2014-12-20 DIAGNOSIS — I10 Essential (primary) hypertension: Secondary | ICD-10-CM | POA: Diagnosis not present

## 2014-12-20 DIAGNOSIS — Z95810 Presence of automatic (implantable) cardiac defibrillator: Secondary | ICD-10-CM | POA: Insufficient documentation

## 2014-12-20 DIAGNOSIS — E785 Hyperlipidemia, unspecified: Secondary | ICD-10-CM | POA: Diagnosis not present

## 2014-12-20 DIAGNOSIS — I340 Nonrheumatic mitral (valve) insufficiency: Secondary | ICD-10-CM | POA: Insufficient documentation

## 2015-01-03 ENCOUNTER — Other Ambulatory Visit: Payer: Self-pay | Admitting: Cardiovascular Disease

## 2015-01-10 ENCOUNTER — Other Ambulatory Visit: Payer: Self-pay | Admitting: Cardiovascular Disease

## 2015-01-11 LAB — PROTIME-INR
INR: 4.1 — AB (ref 0.8–1.2)
Prothrombin Time: 41.5 s — ABNORMAL HIGH (ref 9.1–12.0)

## 2015-01-12 ENCOUNTER — Telehealth: Payer: Self-pay | Admitting: Cardiology

## 2015-01-12 ENCOUNTER — Ambulatory Visit (INDEPENDENT_AMBULATORY_CARE_PROVIDER_SITE_OTHER): Payer: Medicare Other | Admitting: *Deleted

## 2015-01-12 DIAGNOSIS — I442 Atrioventricular block, complete: Secondary | ICD-10-CM

## 2015-01-12 NOTE — Telephone Encounter (Signed)
LMOVM reminding pt to send remote transmission.   

## 2015-01-17 ENCOUNTER — Ambulatory Visit (INDEPENDENT_AMBULATORY_CARE_PROVIDER_SITE_OTHER): Payer: Medicare Other | Admitting: Pharmacist Clinician (PhC)/ Clinical Pharmacy Specialist

## 2015-01-17 ENCOUNTER — Telehealth: Payer: Self-pay | Admitting: Cardiovascular Disease

## 2015-01-17 DIAGNOSIS — Z952 Presence of prosthetic heart valve: Secondary | ICD-10-CM

## 2015-01-17 DIAGNOSIS — Z5181 Encounter for therapeutic drug level monitoring: Secondary | ICD-10-CM

## 2015-01-17 DIAGNOSIS — Z954 Presence of other heart-valve replacement: Secondary | ICD-10-CM

## 2015-01-17 NOTE — Telephone Encounter (Signed)
Pt is calling in wanting to speak with a nurse about her PT INR results. Please f/u with pt  Thanks

## 2015-01-17 NOTE — Progress Notes (Signed)
Remote pacemaker transmission.   

## 2015-01-18 NOTE — Telephone Encounter (Signed)
See anticoag note

## 2015-01-25 ENCOUNTER — Encounter: Payer: Self-pay | Admitting: Cardiology

## 2015-01-25 LAB — CUP PACEART REMOTE DEVICE CHECK
Battery Remaining Longevity: 130 mo
Battery Remaining Percentage: 95.5 %
Brady Statistic AP VP Percent: 1 %
Brady Statistic RA Percent Paced: 1 %
Brady Statistic RV Percent Paced: 99 %
Date Time Interrogation Session: 20161123182116
Implantable Lead Implant Date: 20150116
Implantable Lead Location: 753859
Implantable Lead Location: 753860
Implantable Lead Model: 5076
Lead Channel Impedance Value: 460 Ohm
Lead Channel Impedance Value: 610 Ohm
Lead Channel Pacing Threshold Pulse Width: 0.4 ms
Lead Channel Sensing Intrinsic Amplitude: 5 mV
Lead Channel Setting Pacing Amplitude: 1.125
Lead Channel Setting Pacing Amplitude: 2 V
Lead Channel Setting Pacing Pulse Width: 0.4 ms
Lead Channel Setting Sensing Sensitivity: 2 mV
MDC IDC LEAD IMPLANT DT: 20150116
MDC IDC MSMT BATTERY VOLTAGE: 3.01 V
MDC IDC MSMT LEADCHNL RA PACING THRESHOLD AMPLITUDE: 0.75 V
MDC IDC MSMT LEADCHNL RV PACING THRESHOLD AMPLITUDE: 0.875 V
MDC IDC MSMT LEADCHNL RV PACING THRESHOLD PULSEWIDTH: 0.4 ms
MDC IDC MSMT LEADCHNL RV SENSING INTR AMPL: 12 mV
MDC IDC STAT BRADY AP VS PERCENT: 1 %
MDC IDC STAT BRADY AS VP PERCENT: 99 %
MDC IDC STAT BRADY AS VS PERCENT: 1 %
Pulse Gen Model: 2240
Pulse Gen Serial Number: 7587004

## 2015-01-27 ENCOUNTER — Encounter (HOSPITAL_COMMUNITY): Payer: Self-pay | Admitting: *Deleted

## 2015-01-27 ENCOUNTER — Emergency Department (HOSPITAL_COMMUNITY): Payer: Medicare Other

## 2015-01-27 ENCOUNTER — Inpatient Hospital Stay (HOSPITAL_COMMUNITY)
Admission: EM | Admit: 2015-01-27 | Discharge: 2015-01-30 | DRG: 292 | Disposition: A | Discharge status: Home / Self Care | Payer: Medicare Other | Attending: Cardiology | Admitting: Cardiology

## 2015-01-27 DIAGNOSIS — Z952 Presence of prosthetic heart valve: Secondary | ICD-10-CM | POA: Diagnosis not present

## 2015-01-27 DIAGNOSIS — I701 Atherosclerosis of renal artery: Secondary | ICD-10-CM | POA: Diagnosis present

## 2015-01-27 DIAGNOSIS — Z820 Family history of epilepsy and other diseases of the nervous system: Secondary | ICD-10-CM

## 2015-01-27 DIAGNOSIS — Z9049 Acquired absence of other specified parts of digestive tract: Secondary | ICD-10-CM | POA: Diagnosis not present

## 2015-01-27 DIAGNOSIS — E785 Hyperlipidemia, unspecified: Secondary | ICD-10-CM | POA: Diagnosis present

## 2015-01-27 DIAGNOSIS — Z6841 Body Mass Index (BMI) 40.0 and over, adult: Secondary | ICD-10-CM | POA: Diagnosis not present

## 2015-01-27 DIAGNOSIS — M329 Systemic lupus erythematosus, unspecified: Secondary | ICD-10-CM | POA: Diagnosis present

## 2015-01-27 DIAGNOSIS — R140 Abdominal distension (gaseous): Secondary | ICD-10-CM

## 2015-01-27 DIAGNOSIS — R0602 Shortness of breath: Secondary | ICD-10-CM | POA: Diagnosis present

## 2015-01-27 DIAGNOSIS — B001 Herpesviral vesicular dermatitis: Secondary | ICD-10-CM | POA: Diagnosis present

## 2015-01-27 DIAGNOSIS — E876 Hypokalemia: Secondary | ICD-10-CM

## 2015-01-27 DIAGNOSIS — I739 Peripheral vascular disease, unspecified: Secondary | ICD-10-CM | POA: Diagnosis present

## 2015-01-27 DIAGNOSIS — I5043 Acute on chronic combined systolic (congestive) and diastolic (congestive) heart failure: Secondary | ICD-10-CM | POA: Diagnosis present

## 2015-01-27 DIAGNOSIS — I5033 Acute on chronic diastolic (congestive) heart failure: Secondary | ICD-10-CM | POA: Diagnosis present

## 2015-01-27 DIAGNOSIS — Z7901 Long term (current) use of anticoagulants: Secondary | ICD-10-CM

## 2015-01-27 DIAGNOSIS — Z5181 Encounter for therapeutic drug level monitoring: Secondary | ICD-10-CM

## 2015-01-27 DIAGNOSIS — I509 Heart failure, unspecified: Secondary | ICD-10-CM

## 2015-01-27 DIAGNOSIS — I272 Other secondary pulmonary hypertension: Secondary | ICD-10-CM | POA: Diagnosis present

## 2015-01-27 DIAGNOSIS — I10 Essential (primary) hypertension: Secondary | ICD-10-CM | POA: Diagnosis present

## 2015-01-27 DIAGNOSIS — I110 Hypertensive heart disease with heart failure: Principal | ICD-10-CM | POA: Diagnosis present

## 2015-01-27 DIAGNOSIS — I5031 Acute diastolic (congestive) heart failure: Secondary | ICD-10-CM | POA: Diagnosis present

## 2015-01-27 LAB — LIPASE, BLOOD: Lipase: 28 U/L (ref 11–51)

## 2015-01-27 LAB — COMPREHENSIVE METABOLIC PANEL
ALT: 44 U/L (ref 14–54)
ANION GAP: 8 (ref 5–15)
AST: 43 U/L — ABNORMAL HIGH (ref 15–41)
Albumin: 3.5 g/dL (ref 3.5–5.0)
Alkaline Phosphatase: 81 U/L (ref 38–126)
BUN: 18 mg/dL (ref 6–20)
CO2: 25 mmol/L (ref 22–32)
Calcium: 8.9 mg/dL (ref 8.9–10.3)
Chloride: 104 mmol/L (ref 101–111)
Creatinine, Ser: 1.23 mg/dL — ABNORMAL HIGH (ref 0.44–1.00)
GFR calc Af Amer: 53 mL/min — ABNORMAL LOW (ref >60)
GFR calc non Af Amer: 46 mL/min — ABNORMAL LOW (ref >60)
GLUCOSE: 119 mg/dL — AB (ref 65–99)
Potassium: 4.3 mmol/L (ref 3.5–5.1)
SODIUM: 137 mmol/L (ref 135–145)
Total Bilirubin: 1.2 mg/dL (ref 0.3–1.2)
Total Protein: 6.9 g/dL (ref 6.5–8.1)

## 2015-01-27 LAB — CBC WITH DIFFERENTIAL/PLATELET
BASOS PCT: 1 %
Basophils Absolute: 0 10*3/uL (ref 0.0–0.1)
EOS ABS: 0.1 10*3/uL (ref 0.0–0.7)
Eosinophils Relative: 1 %
HCT: 36.3 % (ref 36.0–46.0)
HEMOGLOBIN: 11.3 g/dL — AB (ref 12.0–15.0)
LYMPHS ABS: 0.5 10*3/uL — AB (ref 0.7–4.0)
Lymphocytes Relative: 6 %
MCH: 27.8 pg (ref 26.0–34.0)
MCHC: 31.1 g/dL (ref 30.0–36.0)
MCV: 89.2 fL (ref 78.0–100.0)
Monocytes Absolute: 1.4 10*3/uL — ABNORMAL HIGH (ref 0.1–1.0)
Monocytes Relative: 17 %
NEUTROS PCT: 75 %
Neutro Abs: 6 10*3/uL (ref 1.7–7.7)
Platelets: 201 10*3/uL (ref 150–400)
RBC: 4.07 MIL/uL (ref 3.87–5.11)
RDW: 17.1 % — ABNORMAL HIGH (ref 11.5–15.5)
WBC: 8 10*3/uL (ref 4.0–10.5)

## 2015-01-27 LAB — I-STAT TROPONIN, ED: Troponin i, poc: 0.03 ng/mL (ref 0.00–0.08)

## 2015-01-27 LAB — BRAIN NATRIURETIC PEPTIDE: B Natriuretic Peptide: 661 pg/mL — ABNORMAL HIGH (ref 0.0–100.0)

## 2015-01-27 LAB — PROTIME-INR
INR: 2.6 — ABNORMAL HIGH (ref 0.00–1.49)
PROTHROMBIN TIME: 27.5 s — AB (ref 11.6–15.2)

## 2015-01-27 MED ORDER — HYDROXYCHLOROQUINE SULFATE 200 MG PO TABS
200.0000 mg | ORAL_TABLET | Freq: Two times a day (BID) | ORAL | Status: DC
  Administered 2015-01-27 – 2015-01-30 (×6): 200 mg via ORAL
  Filled 2015-01-27 (×7): qty 1

## 2015-01-27 MED ORDER — FUROSEMIDE 10 MG/ML IJ SOLN
40.0000 mg | Freq: Once | INTRAMUSCULAR | Status: AC
  Administered 2015-01-27: 40 mg via INTRAVENOUS
  Filled 2015-01-27: qty 4

## 2015-01-27 MED ORDER — RISAQUAD PO CAPS
1.0000 | ORAL_CAPSULE | Freq: Every day | ORAL | Status: DC
  Administered 2015-01-28 – 2015-01-30 (×3): 1 via ORAL
  Filled 2015-01-27 (×3): qty 1

## 2015-01-27 MED ORDER — ALLOPURINOL 100 MG PO TABS: 100.0000 mg | ORAL_TABLET | Freq: Every day | ORAL | Status: DC

## 2015-01-27 MED ORDER — ONDANSETRON HCL 4 MG/2ML IJ SOLN: 4.0000 mg | Freq: Four times a day (QID) | INTRAMUSCULAR | Status: DC | PRN

## 2015-01-27 MED ORDER — SODIUM CHLORIDE 0.9 % IJ SOLN
3.0000 mL | Freq: Two times a day (BID) | INTRAMUSCULAR | Status: DC
  Administered 2015-01-27 – 2015-01-30 (×6): 3 mL via INTRAVENOUS

## 2015-01-27 MED ORDER — ACETAMINOPHEN 325 MG PO TABS
650.0000 mg | ORAL_TABLET | ORAL | Status: DC | PRN
  Administered 2015-01-28 – 2015-01-30 (×3): 650 mg via ORAL
  Filled 2015-01-27 (×3): qty 2

## 2015-01-27 MED ORDER — WARFARIN - PHARMACIST DOSING INPATIENT: Freq: Every day | Status: DC

## 2015-01-27 MED ORDER — ALPRAZOLAM 0.25 MG PO TABS: 0.2500 mg | ORAL_TABLET | Freq: Two times a day (BID) | ORAL | Status: DC | PRN

## 2015-01-27 MED ORDER — CALCIUM CARBONATE 1250 (500 CA) MG PO TABS
1.0000 | ORAL_TABLET | Freq: Two times a day (BID) | ORAL | Status: DC
  Administered 2015-01-27 – 2015-01-30 (×6): 500 mg via ORAL
  Filled 2015-01-27 (×6): qty 1

## 2015-01-27 MED ORDER — MOMETASONE FURO-FORMOTEROL FUM 100-5 MCG/ACT IN AERO
2.0000 | INHALATION_SPRAY | Freq: Two times a day (BID) | RESPIRATORY_TRACT | Status: DC
  Administered 2015-01-27 – 2015-01-30 (×6): 2 via RESPIRATORY_TRACT
  Filled 2015-01-27: qty 8.8

## 2015-01-27 MED ORDER — SODIUM CHLORIDE 0.9 % IV SOLN: 250.0000 mL | INTRAVENOUS | Status: DC | PRN

## 2015-01-27 MED ORDER — NITROGLYCERIN 0.4 MG SL SUBL: 0.4000 mg | SUBLINGUAL_TABLET | SUBLINGUAL | Status: DC | PRN

## 2015-01-27 MED ORDER — SIMVASTATIN 20 MG PO TABS
20.0000 mg | ORAL_TABLET | Freq: Every day | ORAL | Status: DC
  Administered 2015-01-27 – 2015-01-29 (×3): 20 mg via ORAL
  Filled 2015-01-27 (×3): qty 1

## 2015-01-27 MED ORDER — POLYVINYL ALCOHOL 1.4 % OP SOLN: 1.0000 [drp] | OPHTHALMIC | Status: DC | PRN

## 2015-01-27 MED ORDER — VITAMIN D 1000 UNITS PO TABS
1000.0000 [IU] | ORAL_TABLET | Freq: Two times a day (BID) | ORAL | Status: DC
  Administered 2015-01-27 – 2015-01-30 (×6): 1000 [IU] via ORAL
  Filled 2015-01-27 (×6): qty 1

## 2015-01-27 MED ORDER — ZOLPIDEM TARTRATE 5 MG PO TABS: 5.0000 mg | ORAL_TABLET | Freq: Every evening | ORAL | Status: DC | PRN

## 2015-01-27 MED ORDER — LORATADINE 10 MG PO TABS: 10.0000 mg | ORAL_TABLET | Freq: Every day | ORAL | Status: DC

## 2015-01-27 MED ORDER — FUROSEMIDE 10 MG/ML IJ SOLN: 40.0000 mg | Freq: Two times a day (BID) | INTRAMUSCULAR | Status: DC

## 2015-01-27 MED ORDER — LORATADINE 10 MG PO TABS
10.0000 mg | ORAL_TABLET | Freq: Every day | ORAL | Status: DC
  Administered 2015-01-28 – 2015-01-30 (×3): 10 mg via ORAL
  Filled 2015-01-27 (×3): qty 1

## 2015-01-27 MED ORDER — SODIUM CHLORIDE 0.9 % IJ SOLN: 3.0000 mL | INTRAMUSCULAR | Status: DC | PRN

## 2015-01-27 MED ORDER — PREDNISONE 5 MG PO TABS
5.0000 mg | ORAL_TABLET | Freq: Every day | ORAL | Status: DC
  Administered 2015-01-28 – 2015-01-30 (×3): 5 mg via ORAL
  Filled 2015-01-27 (×3): qty 1

## 2015-01-27 MED ORDER — WARFARIN SODIUM 7.5 MG PO TABS
7.5000 mg | ORAL_TABLET | Freq: Once | ORAL | Status: AC
Start: 2015-01-27 — End: 2015-01-27
  Administered 2015-01-27: 7.5 mg via ORAL
  Filled 2015-01-27: qty 1

## 2015-01-27 MED ORDER — HYPROMELLOSE (GONIOSCOPIC) 2.5 % OP SOLN: 1.0000 [drp] | Freq: Every day | OPHTHALMIC | Status: DC | PRN

## 2015-01-27 MED ORDER — FUROSEMIDE 10 MG/ML IJ SOLN
40.0000 mg | Freq: Two times a day (BID) | INTRAMUSCULAR | Status: DC
Start: 2015-01-28 — End: 2015-01-30
  Administered 2015-01-28 – 2015-01-30 (×4): 40 mg via INTRAVENOUS
  Filled 2015-01-27 (×4): qty 4

## 2015-01-27 MED ORDER — ALLOPURINOL 100 MG PO TABS
100.0000 mg | ORAL_TABLET | Freq: Every day | ORAL | Status: DC
  Administered 2015-01-28 – 2015-01-30 (×3): 100 mg via ORAL
  Filled 2015-01-27 (×3): qty 1

## 2015-01-27 NOTE — ED Notes (Signed)
Cardiology at bedside.

## 2015-01-27 NOTE — H&P (Signed)
History and Physical   Patient ID: [name] MRN: [id number], DOB/AGE: 1950/10/13 64 y.o. Date of Encounter: 01/27/2015  Primary Physician: [name], MD Primary Cardiologist: Dr Gwenlyn Found  Chief Complaint:  SOB  HPI: [name] is a 64 y.o. female with a history of mech AVR, CHB w/ St Jude PPM, D-CHF, PAD (L-ICA 100%), SLE, Obesity. Nl cors 2002 and low risk MV 2011.  [name] was in [employer]. She admits to dietary indiscretion and having her legs down most of the day. She does not weigh at home and was not weighing while on vacation.   After she came back, on Monday she started noticing increased DOE and fatigue. Her LE edema was worse than usual. She developed orthopnea and PND over the last 2 nights. She went to her primary MD and her oxygen levels were low, so she was sent by EMS to the ER. In the ER, she has received 40 mg Lasix IV and is urinating frequently. She feels tired going to and from the BR and is SOB moving herself around in the bed.    Past Medical History  Diagnosis Date  . Asthma   . Lupus (systemic lupus erythematosus) ([place])   . PVD (peripheral vascular disease) (HCC)     60% R renal artery stenosis; non-functioning L kidney  . Hypertension   . Dyslipidemia   . Chronic anticoagulation   . History of echocardiogram 11/08/2011    EF >55%; mild-mod concentric LVH; trace aortic regurg.; LA mild-mod dilated  . History of nuclear stress test 01/20/2010    normal pattern of perfusion; low risk scan  . H/O Doppler ultrasound 10/03/2010    carotid duplex - R ICA normal patency; L CCA-ICA bypass gradt patent common to interal cartoid bypass graft   . H/O mechanical aortic valve replacement   . Complete heart block ([place]) 1//2015    Implantation of a dual chamber pacemaker on 03-06-2013 by Dr Rayann Heman.  The patient received a STJ model number [id number] pacemaker with model number [id number] right ventricular lead.   . Carotid artery disease [facility])     Surgical History:    Past Surgical History  Procedure Laterality Date  . Aortic valve replacement  11/29/006    Gerhardt; St. Jude; on coumadin  . Exploratory laparotomy  04/12/2012    small bowel perf; ileostomy & R hemicolectomy   . Tubal ligation  11/1982  . Cholecystectomy  1999  . Cardiac catheterization  11/01/2004    define anatomy   . Carotid endarterectomy      lt.  . Pacemaker insertion  03/06/2013    SJM Assurity DR PPM implanted by Dr Rayann Heman for complete heart block  . Permanent pacemaker insertion N/A 03/06/2013    Procedure: PERMANENT PACEMAKER INSERTION;  Surgeon: Coralyn Mark, MD;  Location: [place] CATH LAB;  Service: Cardiovascular;  Laterality: N/A;  . Temporary pacemaker insertion N/A 03/06/2013    Procedure: TEMPORARY PACEMAKER INSERTION;  Surgeon: Coralyn Mark, MD;  Location: [place] CATH LAB;  Service: Cardiovascular;  Laterality: N/A;     I have reviewed the patient's current medications. Medication Sig  ADVAIR DISKUS 250-50 MCG/DOSE AEPB Use 1 inhalation by mouth  two times daily  allopurinol (ZYLOPRIM) 100 MG tablet Take 1 tablet by mouth daily.  Calcium Carbonate (CALTRATE 600) 1500 MG TABS Take 1 tablet by mouth 2 (two) times daily.    cholecalciferol (VITAMIN D) 1000 UNITS tablet Take 1,000 Units by mouth 2 (  two) times daily.  furosemide (LASIX) 40 MG tablet Take 40 mg by mouth daily.  hydroxychloroquine (PLAQUENIL) 200 MG tablet Take 1 tablet (200 mg total) by mouth 2 (two) times daily.  hydroxypropyl methylcellulose / hypromellose (ISOPTO TEARS / GONIOVISC) 2.5 % ophthalmic solution Place 1 drop into both eyes daily as needed for dry eyes.  loratadine (CLARITIN) 10 MG tablet Take 10 mg by mouth daily.  predniSONE (DELTASONE) 5 MG tablet Take 5 mg by mouth daily.    Probiotic Product (PROBIOTIC DAILY PO) Take 1 tablet by mouth daily.  simvastatin (ZOCOR) 20 MG tablet Take 1 tablet (20 mg total) by mouth at bedtime.  warfarin (COUMADIN) 5 MG tablet Take 5-7.5 mg by mouth daily. Take  5mg  on Mon, wed, fri and saturdays. The other days take a tablet and a half  warfarin (COUMADIN) 5 MG tablet Take 1 to 1 and 1/2 tablets by mouth daily as directed  by coumadin clinic Patient not taking: Reported on 01/27/2015   Scheduled Meds: Continuous Infusions: PRN Meds:.  Allergies: No Known Allergies  Social History   Social History  . Marital Status: Divorced    Spouse Name: N/A  . Number of Children: 3  . Years of Education: N/A   Occupational History  . Retired    Social History Main Topics  . Smoking status: Never Smoker   . Smokeless tobacco: Never Used  . Alcohol Use: No  . Drug Use: No  . Sexual Activity: Not on file   Other Topics Concern  . Not on file   Social History Narrative   Lives alone in [place].    Family History  Problem Relation Age of Onset  . Alzheimer's disease Father   . Cirrhosis Father   . Cancer Father     prostate   Family Status  Relation Status Death Age  . Mother Alive     Born [phone]  . Father Deceased 41    Review of Systems:   Full 14-point review of systems otherwise negative except as noted above.  Physical Exam: Blood pressure 145/66, pulse 89, temperature 99 F (37.2 C), temperature source Oral, resp. rate 25, weight 280 lb 4 oz (127.121 kg), SpO2 94 %. General: Well developed, well nourished,female in moderate respiratory distress. Head: Normocephalic, atraumatic, sclera non-icteric, no xanthomas, nares are without discharge. Dentition: good Neck: No carotid bruits. JVD elevated at 11 cm. No thyromegally Lungs: Good expansion bilaterally. without wheezes or rhonchi. Bilateral rales Heart: Regular rate and rhythm with S1 S2.  No S3 or S4.  2/6 murmur, no rubs, or gallops appreciated. + Valve click Abdomen: Soft, non-tender, non-distended with normoactive bowel sounds. No hepatomegaly. No rebound/guarding. No obvious abdominal masses. Msk:  Strength and tone appear normal for age. No joint deformities or  effusions, no spine or costo-vertebral angle tenderness. Extremities: No clubbing or cyanosis. R>L 1-2+ edema.  Distal pedal pulses are 2+ in 4 extrem Neuro: Alert and oriented X 3. Moves all extremities spontaneously. No focal deficits noted. Psych:  Responds to questions appropriately with a normal affect. Skin: No rashes or lesions noted  Labs:   Lab Results  Component Value Date   WBC 8.0 01/27/2015   HGB 11.3* 01/27/2015   HCT 36.3 01/27/2015   MCV 89.2 01/27/2015   PLT 201 01/27/2015    Recent Labs  01/27/15 1713  INR 2.60*     Recent Labs Lab 01/27/15 1713  NA 137  K 4.3  CL 104  CO2 25  BUN 18  CREATININE 1.23*  CALCIUM 8.9  PROT 6.9  BILITOT 1.2  ALKPHOS 81  ALT 44  AST 43*  GLUCOSE 119*    Recent Labs  01/27/15 1726  TROPIPOC 0.03   Lab Results  Component Value Date   CHOL 157 06/02/2014   HDL 101 06/02/2014   LDLCALC 40 06/02/2014   TRIG 78 06/02/2014   B NATRIURETIC PEPTIDE  Date/Time Value Ref Range Status  01/27/2015 05:13 PM 661.0* 0.0 - 100.0 pg/mL Final   Radiology/Studies: Dg Chest 2 View 01/27/2015  CLINICAL DATA:  Shortness of breath for 3 days. History of hypertension, asthma, systemic lupus erythematosus and peripheral vascular disease EXAM: CHEST  2 VIEW COMPARISON:  11/25/2013 and 05/07/2013. FINDINGS: Left subclavian pacemaker leads extend into the right atrium and right ventricle. There is stable cardiomegaly status post median sternotomy and probable aortic valve replacement. There is increased vascular congestion with mild interstitial edema. There is no confluent airspace opacity, significant pleural effusion or pneumothorax. There is asymmetric sclerosis of the right humeral head which may indicate avascular necrosis. Rounded density overlying the scapular neck could reflect a loose body in the superior subscapularis recess. IMPRESSION: 1. Cardiomegaly with mild interstitial pulmonary edema consistent with congestive heart  failure. 2. Probable chronic right humeral head AVN with loose body. This has been previously evaluated by MRI 01/11/2010. Electronically Signed   By: Richardean Sale M.D.   On: 01/27/2015 17:51   Dg Abd 2 Views 01/27/2015  CLINICAL DATA:  Abdominal distension EXAM: ABDOMEN - 2 VIEW COMPARISON:  None. FINDINGS: Scattered large and small bowel gas is noted. No free air is seen. No abnormal mass or abnormal calcifications are noted. Degenerative changes of the lumbar spine are seen. IMPRESSION: No acute abnormality noted. Electronically Signed   By: Inez Catalina M.D.   On: 01/27/2015 17:50     Cardiac Cath: 01/21/2001 IMPRESSION: The patient has essentially normal coronary arteries and normal left ventricular function. I believe her Cardiolite stress test was falsely positive due to body habitus and breast attenuation.   Echo: 12-20-2014 - Left ventricle: Wall thickness was increased in a pattern of mild LVH. Systolic function was normal. The estimated ejection fraction was in the range of 50% to 55%. Incoordinate septal motion, apical hypokinesis. Diastolic dysfunction with indeterminate LV Filling pressures. - Aortic valve: St. Jude mechanical AVR - normal leaflet motion. No obstruction, trivial AI cleaning jet. Valve area (VTI): 1.08 cm^2. Valve area (Vmax): 1 cm^2. Valve area (Vmean): 1.01 cm^2. - Mitral valve: Calcified annulus. Mildly thickened leaflets . There was mild posteriorly directed regurgitation. - Left atrium: Moderately dilated at 46 ml/m2. - Right ventricle: The cavity size was moderately dilated. Pacer wire or catheter noted in right ventricle. - Right atrium: Severely dilated at 30 cm2. Pacer wire or catheter noted in right atrium. - Tricuspid valve: There was moderate regurgitation. - Pulmonary arteries: The main pulmonary artery is dilated. PA peak pressure: 44 mm Hg (S). - Inferior vena cava: The IVC measures <2.1 cm, but does not collapse  >50%, suggesting an elevated RA pressure of 8 mmHg. Impressions: - Compared to the prior study in 2015, pacer or AICD wires noted. LVEF is improved to 50-55%, the mechanical AVR shows no signs of obstruction, moderate TR, RVSP 44 mmHg.  ECG: 01/27/2015 SR, Vpacing but this is not clear  ASSESSMENT AND PLAN:  Principal Problem:   Acute on chronic diastolic CHF (congestive heart failure), NYHA class 4 (HCC) - admit, diurese, follow I/O,  renal function weights - Nutrition consult for low sodium diet (also admits she needs to lose weight) - recent echo OK, no need to repeat - family states they will get her a good set of scales to use after d/c  Otherwise, continue current rx Active Problems:   S/P AVR (aortic valve replacement)   Long term (current) use of anticoagulants - Coumadin for mechanical AoV   Essential hypertension   Renal artery stenosis [facility])   Morbid obesity [facility])   Signed, Lenoard Aden 01/27/2015 7:50 PM Beeper [id number]    Patient seen and discussed with PA Barrett, I agree with her documentation. 64 yo female history of St Jude AVR in 2006, complete heart block with pacemaker, PAD, SLE, renal artery stenosis, HTN admitted with shortness of breath, LE edema, abdominal distension, and weight gain. Reports recent trip to [facility] for about a week with heavy salt intake.    ER vitals: p 96 bp 153/82 100% RA Weight 280 lbs INR 2.6, BNP 661, K 4.3, Cr 1.23 (baseline 1-1.3), Hgb 11.3, Plt 201, trop neg x 1 CXR cardiomegaly, mild interstitial edema Abd xray: no acute process 11/2014 echo LVEF 50-55%, indeterminate diastolic function, normal AVR EKG, LBBB pattern I suspect ventricular paced rhythm though pacer spikes not clear  Acute on chronic diastolic HF secondary to dietary insdiscretion, if weights accurate she is apparently up 16 lbs since clinic visit 11/2014. Will admit for IV diuresis. Close monitoring of renal function given her history of kidney  disease.    Zandra Abts MD

## 2015-01-27 NOTE — ED Notes (Addendum)
Pt to ED via [facility] c/o shortness of breath x 3 days. Pt was seen at [facility]. Pt has had recent travel to [place] which pt noted edema to bilateral extremities. Pt with +3 pitting edema and shortness of breath. Diminished lower lobes, EMS reported rhonchi. Pt reports 18lb weight gain x 16months

## 2015-01-27 NOTE — ED Notes (Signed)
Patient transported to X-ray 

## 2015-01-27 NOTE — ED Notes (Signed)
Family at bedside. 

## 2015-01-27 NOTE — ED Provider Notes (Signed)
CSN: [id number]     Arrival date & time 01/27/15  1653 History   First MD Initiated Contact with Patient 01/27/15 1656     Chief Complaint  Patient presents with  . Shortness of Breath     (Consider location/radiation/quality/duration/timing/severity/associated sxs/prior Treatment) The history is provided by the patient.  RAYNIA NISWONGER is a 64 y.o. female history of lupus, peripheral vascular disease, hypertension, heart failure with pacemaker, here presenting with shortness of breath, abdominal swelling. Patient states that she's been gaining weight for the last 6 months. Patient states that she has been having worsening shortness of breath for the last 3 days. Also worsening leg swelling and abdominal swelling as well. Shortness of breath is worse at night and worse when she sleeps. Had an episode of chest pain this morning that resolved. Of note, she had perforated bowel earlier this year at [facility] and required colostomy and had a reversal. States that she has no abdominal pain, just swelling but has been concerned about perforated bowel since surgery.    Past Medical History  Diagnosis Date  . Asthma   . Lupus (systemic lupus erythematosus) ([place])   . PVD (peripheral vascular disease) (HCC)     60% R renal artery stenosis; non-functioning L kidney  . Hypertension   . Dyslipidemia   . Chronic anticoagulation   . History of echocardiogram 11/08/2011    EF >55%; mild-mod concentric LVH; trace aortic regurg.; LA mild-mod dilated  . History of nuclear stress test 01/20/2010    normal pattern of perfusion; low risk scan  . H/O Doppler ultrasound 10/03/2010    carotid duplex - R ICA normal patency; L CCA-ICA bypass gradt patent common to interal cartoid bypass graft   . H/O mechanical aortic valve replacement   . Complete heart block ([place]) 1//2015    Implantation of a dual chamber pacemaker on 03-06-2013 by Dr Rayann Heman.  The patient received a STJ model number [id number] pacemaker with model number  [id number] right ventricular lead.   . Carotid artery disease [facility])    Past Surgical History  Procedure Laterality Date  . Aortic valve replacement  11/29/006    Gerhardt; St. Jude; on coumadin  . Exploratory laparotomy  04/12/2012    small bowel perf; ileostomy & R hemicolectomy   . Tubal ligation  11/1982  . Cholecystectomy  1999  . Cardiac catheterization  11/01/2004    define anatomy   . Carotid endarterectomy      lt.  . Pacemaker insertion  03/06/2013    SJM Assurity DR PPM implanted by Dr Rayann Heman for complete heart block  . Permanent pacemaker insertion N/A 03/06/2013    Procedure: PERMANENT PACEMAKER INSERTION;  Surgeon: Coralyn Mark, MD;  Location: [place] CATH LAB;  Service: Cardiovascular;  Laterality: N/A;  . Temporary pacemaker insertion N/A 03/06/2013    Procedure: TEMPORARY PACEMAKER INSERTION;  Surgeon: Coralyn Mark, MD;  Location: [place] CATH LAB;  Service: Cardiovascular;  Laterality: N/A;   Family History  Problem Relation Age of Onset  . Alzheimer's disease Father   . Cirrhosis Father   . Cancer Father     prostate   Social History  Substance Use Topics  . Smoking status: Never Smoker   . Smokeless tobacco: Never Used  . Alcohol Use: No   OB History    No data available     Review of Systems  Respiratory: Positive for shortness of breath.   Cardiovascular: Positive for leg swelling.  All other systems reviewed  and are negative.     Allergies  Review of patient's allergies indicates no known allergies.  Home Medications   Prior to Admission medications   Medication Sig Start Date End Date Taking? Authorizing Provider  ADVAIR DISKUS 250-50 MCG/DOSE AEPB Use 1 inhalation by mouth  two times daily 11/10/14  Yes Deneise Lever, MD  allopurinol (ZYLOPRIM) 100 MG tablet Take 1 tablet by mouth daily. 09/09/14  Yes Historical Provider, MD  Calcium Carbonate (CALTRATE 600) 1500 MG TABS Take 1 tablet by mouth 2 (two) times daily.     Yes Historical Provider, MD   cholecalciferol (VITAMIN D) 1000 UNITS tablet Take 1,000 Units by mouth 2 (two) times daily.   Yes Historical Provider, MD  furosemide (LASIX) 40 MG tablet Take 40 mg by mouth daily.   Yes Historical Provider, MD  hydroxychloroquine (PLAQUENIL) 200 MG tablet Take 1 tablet (200 mg total) by mouth 2 (two) times daily. 07/04/12  Yes [name], MD  hydroxypropyl methylcellulose / hypromellose (ISOPTO TEARS / GONIOVISC) 2.5 % ophthalmic solution Place 1 drop into both eyes daily as needed for dry eyes.   Yes Historical Provider, MD  loratadine (CLARITIN) 10 MG tablet Take 10 mg by mouth daily.   Yes Historical Provider, MD  predniSONE (DELTASONE) 5 MG tablet Take 5 mg by mouth daily.     Yes Historical Provider, MD  Probiotic Product (PROBIOTIC DAILY PO) Take 1 tablet by mouth daily.   Yes Historical Provider, MD  simvastatin (ZOCOR) 20 MG tablet Take 1 tablet (20 mg total) by mouth at bedtime. 04/06/14  Yes [name], MD  warfarin (COUMADIN) 5 MG tablet Take 5-7.5 mg by mouth daily. Take 5mg  on Mon, wed, fri and saturdays. The other days take a tablet and a half   Yes Historical Provider, MD  warfarin (COUMADIN) 5 MG tablet Take 1 to 1 and 1/2 tablets by mouth daily as directed  by coumadin clinic Patient not taking: Reported on 01/27/2015 01/03/15   [name], MD   BP 123/60 mmHg  Pulse 90  Temp(Src) 99 F (37.2 C) (Oral)  Resp 24  Wt 280 lb 4 oz (127.121 kg)  SpO2 100% Physical Exam  Constitutional: She is oriented to person, place, and time.  Chronically ill, short of breath   HENT:  Head: Normocephalic.  Mouth/Throat: Oropharynx is clear and moist.  Eyes: Conjunctivae are normal. Pupils are equal, round, and reactive to light.  Neck: Normal range of motion. Neck supple.  Cardiovascular: Normal rate, regular rhythm and normal heart sounds.   Pulmonary/Chest:  Slightly tachypneic, + crackles bilateral bases   Abdominal: Soft.  Distended, no fluid wave. + edema. Mild  epigastric tenderness   Musculoskeletal: Normal range of motion.  2+ edema bilateral legs   Neurological: She is alert and oriented to person, place, and time.  Skin: Skin is warm and dry.  Psychiatric: She has a normal mood and affect. Her behavior is normal. Judgment and thought content normal.  Nursing note and vitals reviewed.   ED Course  Procedures (including critical care time) Labs Review Labs Reviewed  CBC WITH DIFFERENTIAL/PLATELET - Abnormal; Notable for the following:    Hemoglobin 11.3 (*)    RDW 17.1 (*)    Lymphs Abs 0.5 (*)    Monocytes Absolute 1.4 (*)    All other components within normal limits  COMPREHENSIVE METABOLIC PANEL - Abnormal; Notable for the following:    Glucose, Bld 119 (*)    Creatinine,  Ser 1.23 (*)    AST 43 (*)    GFR calc non Af Amer 46 (*)    GFR calc Af Amer 53 (*)    All other components within normal limits  BRAIN NATRIURETIC PEPTIDE - Abnormal; Notable for the following:    B Natriuretic Peptide 661.0 (*)    All other components within normal limits  PROTIME-INR - Abnormal; Notable for the following:    Prothrombin Time 27.5 (*)    INR 2.60 (*)    All other components within normal limits  LIPASE, BLOOD  I-STAT TROPOININ, ED    Imaging Review Dg Chest 2 View  01/27/2015  CLINICAL DATA:  Shortness of breath for 3 days. History of hypertension, asthma, systemic lupus erythematosus and peripheral vascular disease EXAM: CHEST  2 VIEW COMPARISON:  11/25/2013 and 05/07/2013. FINDINGS: Left subclavian pacemaker leads extend into the right atrium and right ventricle. There is stable cardiomegaly status post median sternotomy and probable aortic valve replacement. There is increased vascular congestion with mild interstitial edema. There is no confluent airspace opacity, significant pleural effusion or pneumothorax. There is asymmetric sclerosis of the right humeral head which may indicate avascular necrosis. Rounded density overlying the  scapular neck could reflect a loose body in the superior subscapularis recess. IMPRESSION: 1. Cardiomegaly with mild interstitial pulmonary edema consistent with congestive heart failure. 2. Probable chronic right humeral head AVN with loose body. This has been previously evaluated by MRI 01/11/2010. Electronically Signed   By: Richardean Sale M.D.   On: 01/27/2015 17:51   Dg Abd 2 Views  01/27/2015  CLINICAL DATA:  Abdominal distension EXAM: ABDOMEN - 2 VIEW COMPARISON:  None. FINDINGS: Scattered large and small bowel gas is noted. No free air is seen. No abnormal mass or abnormal calcifications are noted. Degenerative changes of the lumbar spine are seen. IMPRESSION: No acute abnormality noted. Electronically Signed   By: Inez Catalina M.D.   On: 01/27/2015 17:50   I have personally reviewed and evaluated these images and lab results as part of my medical decision-making.   EKG Interpretation   Date/Time:  Thursday January 27 2015 17:01:08 EST Ventricular Rate:  94 PR Interval:  223 QRS Duration: 183 QT Interval:  404 QTC Calculation: 505 R Axis:   -72 Text Interpretation:  Sinus rhythm Prolonged PR interval Left bundle  branch block No significant change since last tracing Confirmed by YAO   MD, DAVID ([id number]) on 01/27/2015 5:08:35 PM      MDM   Final diagnoses:  Abdominal distention    ESTANISLADA LAROCCA is a 64 y.o. female here with shortness of breath, ab distention. Likely CHF exacerbation. Abdomen not rigid. Had hx of perforated bowel but I have low suspicion for recurrence so will get xrays. Will get labs, BNP, CXR. Borderline hypoxic so likely need admission.   6:48 PM CXR showed CHF. BNP 660. Ab xray showed no perforation. Consulted cardiology to admit.     Wandra Arthurs, MD 01/28/15 Dyann Kief

## 2015-01-27 NOTE — ED Notes (Signed)
Dr. Yao at bedside. 

## 2015-01-27 NOTE — Progress Notes (Signed)
ANTICOAGULATION CONSULT NOTE - Initial Consult  Pharmacy Consult for warfarin Indication: mechanical AVR  No Known Allergies  Patient Measurements: Weight: 280 lb 4 oz (127.121 kg) Heparin Dosing Weight:   Vital Signs: Temp: 99 F (37.2 C) (12/08 1710) Temp Source: Oral (12/08 1710) BP: 133/59 mmHg (12/08 2031) Pulse Rate: 90 (12/08 2031)  Labs:  Recent Labs  01/27/15 1713  HGB 11.3*  HCT 36.3  PLT 201  LABPROT 27.5*  INR 2.60*  CREATININE 1.23*    Estimated Creatinine Clearance: 63.6 mL/min (by C-G formula based on Cr of 1.23).   Medical History: Past Medical History  Diagnosis Date  . Asthma   . Lupus (systemic lupus erythematosus) ([place])   . PVD (peripheral vascular disease) (HCC)     60% R renal artery stenosis; non-functioning L kidney  . Hypertension   . Dyslipidemia   . Chronic anticoagulation   . History of echocardiogram 11/08/2011    EF >55%; mild-mod concentric LVH; trace aortic regurg.; LA mild-mod dilated  . History of nuclear stress test 01/20/2010    normal pattern of perfusion; low risk scan  . H/O Doppler ultrasound 10/03/2010    carotid duplex - R ICA normal patency; L CCA-ICA bypass gradt patent common to interal cartoid bypass graft   . H/O mechanical aortic valve replacement   . Complete heart block ([place]) 1//2015    Implantation of a dual chamber pacemaker on 03-06-2013 by Dr Rayann Heman.  The patient received a STJ model number [id number] pacemaker with model number [id number] right ventricular lead.   . Carotid artery disease ([place])    Assessment: 82 yof presented to the ED with SOB. She is on chronic coumadin for history of mechanical AVR. INR is therapeutic at 2.6. H/H slightly low but platelets are WNL. No bleeding noted.   Goal of Therapy:  INR 2.5-3.5 Monitor platelets by anticoagulation protocol: Yes   Plan:  - Warfarin 7.5mg  PO x 1 tonight - Daily INR  Briley Sulton, Rande Lawman 01/27/2015,8:55 PM

## 2015-01-28 DIAGNOSIS — E876 Hypokalemia: Secondary | ICD-10-CM

## 2015-01-28 DIAGNOSIS — I5043 Acute on chronic combined systolic (congestive) and diastolic (congestive) heart failure: Secondary | ICD-10-CM | POA: Diagnosis present

## 2015-01-28 LAB — BASIC METABOLIC PANEL
ANION GAP: 6 (ref 5–15)
BUN: 19 mg/dL (ref 6–20)
CALCIUM: 8.7 mg/dL — AB (ref 8.9–10.3)
CHLORIDE: 105 mmol/L (ref 101–111)
CO2: 28 mmol/L (ref 22–32)
Creatinine, Ser: 1.22 mg/dL — ABNORMAL HIGH (ref 0.44–1.00)
GFR calc non Af Amer: 46 mL/min — ABNORMAL LOW (ref >60)
GFR, EST AFRICAN AMERICAN: 54 mL/min — AB (ref >60)
GLUCOSE: 103 mg/dL — AB (ref 65–99)
POTASSIUM: 3.3 mmol/L — AB (ref 3.5–5.1)
Sodium: 139 mmol/L (ref 135–145)

## 2015-01-28 LAB — PROTIME-INR
INR: 2.48 — ABNORMAL HIGH (ref 0.00–1.49)
Prothrombin Time: 26.5 seconds — ABNORMAL HIGH (ref 11.6–15.2)

## 2015-01-28 MED ORDER — WARFARIN SODIUM 7.5 MG PO TABS
7.5000 mg | ORAL_TABLET | Freq: Once | ORAL | Status: AC
  Administered 2015-01-28: 7.5 mg via ORAL
  Filled 2015-01-28: qty 1

## 2015-01-28 MED ORDER — POTASSIUM CHLORIDE CRYS ER 20 MEQ PO TBCR
40.0000 meq | EXTENDED_RELEASE_TABLET | Freq: Two times a day (BID) | ORAL | Status: AC
  Administered 2015-01-28 (×2): 40 meq via ORAL
  Filled 2015-01-28 (×2): qty 2

## 2015-01-28 NOTE — Progress Notes (Signed)
  RD consulted for nutrition education regarding low sodium diet and weight loss.  Body mass index is 44.79 kg/(m^2). Pt meets criteria for Morbid Obesity based on current BMI.  RD provided "Weight Loss Tips" handout from the Academy of Nutrition and Dietetics. Emphasized the importance of serving sizes. Discussed tips to reduce intake/portions of common foods. Discussed importance of controlled and consistent intake throughout the day. Provided examples of ways to balance meals/snacks and encouraged intake of high-fiber, whole grain complex carbohydrates. Emphasized the importance of hydration with calorie-free beverages and limiting sugar-sweetened beverages. Pt is already very knowledgeable about healthful eating and low sodium diet. Encouraged pt to seek outpatient nutrition counseling. Pt states that her major issues are snacking too much at night and eating larger portion sizes. RD provided "20 Ways to Enjoy More Fruits and Vegetables" and "Smart Snacking for Adults and Teens" handouts from the Academy of Nutrition and Dietetics. Teach back method used.  Expect good compliance.  Current diet order is 2 Gram Sodium, patient is consuming approximately 100% of meals at this time. Labs and medications reviewed. No further nutrition interventions warranted at this time. RD contact information provided. If additional nutrition issues arise, please re-consult RD.  Scarlette Ar RD, LDN Inpatient Clinical Dietitian Pager: [phone] After Hours Pager: [phone]

## 2015-01-28 NOTE — Discharge Instructions (Signed)

## 2015-01-28 NOTE — Progress Notes (Signed)
Patient Name:  [name], DOB: [birth year], MRN: [id number] Primary Doctor: [name], MD Primary Cardiologist: Date: 01/28/2015   SUBJECTIVE: The patient is diuresing. She is feeling better already. She had significant weight gain when traveling to [employer]. She has persistent edema this morning. We know from an echo in October, 2016, that her EF is in the range of 50-55% and that her aortic valve prosthesis is working well without obstruction. She does have some pulmonary hypertension and some tricuspid regurgitation.   Past Medical History  Diagnosis Date  . Asthma   . Lupus (systemic lupus erythematosus) ([place])   . PVD (peripheral vascular disease) ([facility])     60% R renal artery stenosis; non-functioning L kidney  . Hypertension   . Dyslipidemia   . Chronic anticoagulation   . History of echocardiogram 11/08/2011    EF >55%; mild-mod concentric LVH; trace aortic regurg.; LA mild-mod dilated  . History of nuclear stress test 01/20/2010    normal pattern of perfusion; low risk scan  . H/O Doppler ultrasound 10/03/2010    carotid duplex - R ICA normal patency; L CCA-ICA bypass gradt patent common to interal cartoid bypass graft   . H/O mechanical aortic valve replacement   . Complete heart block ([place]) 1//2015    Implantation of a dual chamber pacemaker on 03-06-2013 by Dr Rayann Heman.  The patient received a STJ model number [id number] pacemaker with model number 2088 right ventricular lead.   . Carotid artery disease ([place])    Filed Vitals:   01/27/15 2031 01/27/15 2202 01/28/15 0517 01/28/15 0826  BP: 133/59 134/57 107/57   Pulse: 90 88 72   Temp:  98.1 F (36.7 C) 97.9 F (36.6 C)   TempSrc:  Oral Oral   Resp: 24 20 20    Height:  5\' 5"  (1.651 m)    Weight:  291 lb 7.2 oz (132.2 kg) 269 lb 2.9 oz (122.1 kg)   SpO2: 95% 96% 98% 98%    Intake/Output Summary (Last 24 hours) at 01/28/15 0854 Last data filed at 01/28/15 0825  Gross per 24 hour  Intake    120 ml  Output    1250 ml  Net  -1130 ml   Filed Weights   01/27/15 1714 01/27/15 2202 01/28/15 0517  Weight: 280 lb 4 oz (127.121 kg) 291 lb 7.2 oz (132.2 kg) 269 lb 2.9 oz (122.1 kg)     LABS: Basic Metabolic Panel:  Recent Labs  01/27/15 1713 01/28/15 0344  NA 137 139  K 4.3 3.3*  CL 104 105  CO2 25 28  GLUCOSE 119* 103*  BUN 18 19  CREATININE 1.23* 1.22*  CALCIUM 8.9 8.7*   Liver Function Tests:  Recent Labs  01/27/15 1713  AST 43*  ALT 44  ALKPHOS 81  BILITOT 1.2  PROT 6.9  ALBUMIN 3.5    Recent Labs  01/27/15 1713  LIPASE 28   CBC:  Recent Labs  01/27/15 1713  WBC 8.0  NEUTROABS 6.0  HGB 11.3*  HCT 36.3  MCV 89.2  PLT 201   Cardiac Enzymes: No results for input(s): CKTOTAL, CKMB, CKMBINDEX, TROPONINI in the last 72 hours. BNP: Invalid input(s): POCBNP D-Dimer: No results for input(s): DDIMER in the last 72 hours. Thyroid Function Tests: No results for input(s): TSH, T4TOTAL, T3FREE, THYROIDAB in the last 72 hours.  Invalid input(s): FREET3  RADIOLOGY: Dg Chest 2 View  01/27/2015  CLINICAL DATA:  Shortness of breath for 3 days. History  of hypertension, asthma, systemic lupus erythematosus and peripheral vascular disease EXAM: CHEST  2 VIEW COMPARISON:  11/25/2013 and 05/07/2013. FINDINGS: Left subclavian pacemaker leads extend into the right atrium and right ventricle. There is stable cardiomegaly status post median sternotomy and probable aortic valve replacement. There is increased vascular congestion with mild interstitial edema. There is no confluent airspace opacity, significant pleural effusion or pneumothorax. There is asymmetric sclerosis of the right humeral head which may indicate avascular necrosis. Rounded density overlying the scapular neck could reflect a loose body in the superior subscapularis recess. IMPRESSION: 1. Cardiomegaly with mild interstitial pulmonary edema consistent with congestive heart failure. 2. Probable chronic right humeral head  AVN with loose body. This has been previously evaluated by MRI 01/11/2010. Electronically Signed   By: Richardean Sale M.D.   On: 01/27/2015 17:51   Dg Abd 2 Views  01/27/2015  CLINICAL DATA:  Abdominal distension EXAM: ABDOMEN - 2 VIEW COMPARISON:  None. FINDINGS: Scattered large and small bowel gas is noted. No free air is seen. No abnormal mass or abnormal calcifications are noted. Degenerative changes of the lumbar spine are seen. IMPRESSION: No acute abnormality noted. Electronically Signed   By: Inez Catalina M.D.   On: 01/27/2015 17:50    PHYSICAL EXAM  patient is comfortable today. She is oriented to person time and place. Affect is normal. Head is atraumatic. Lungs reveal scattered rhonchi. Cardiac exam reveals crisp closure sound of the aortic prosthesis. There is an outflow murmur from the valve. The abdomen is soft. The patient is overweight. There is continued peripheral edema.   TELEMETRY: I have reviewed telemetry today January 28, 2015. There is atrial sensing and ventricular pacing.   ASSESSMENT AND PLAN:    S/P AVR (aortic valve replacement)     The patient's aortic prosthesis is working well. Last echo was done in October, 2016    Long term (current) use of anticoagulants - Coumadin for mechanical AoV     The patient is on Coumadin for her mechanical aortic valve replacement. Pharmacy will be dosing Coumadin while she is in the hospital.    Essential hypertension   Renal artery stenosis ([place])   Morbid obesity ([place])    Acute on chronic diastolic CHF (congestive heart failure) ([place])     The patient's volume overload is related to travel to [employer]. She is diuresing. I will allow her to ambulate. She needs to elevate her feet when she is not ambulating.    Hypokalemia    Potassium will be treated.  Dola Argyle 01/28/2015 8:54 AM

## 2015-01-28 NOTE — Progress Notes (Signed)
ANTICOAGULATION CONSULT NOTE - Initial Consult  Pharmacy Consult for warfarin Indication: mechanical AVR  No Known Allergies  Patient Measurements: Height: 5\' 5"  (165.1 cm) Weight: 269 lb 2.9 oz (122.1 kg) (scale c) IBW/kg (Calculated) : 57 Heparin Dosing Weight:   Vital Signs: Temp: 97.9 F (36.6 C) (12/09 0517) Temp Source: Oral (12/09 0517) BP: 107/57 mmHg (12/09 0517) Pulse Rate: 72 (12/09 0517)  Labs:  Recent Labs  01/27/15 1713 01/28/15 0344  HGB 11.3*  --   HCT 36.3  --   PLT 201  --   LABPROT 27.5* 26.5*  INR 2.60* 2.48*  CREATININE 1.23* 1.22*    Estimated Creatinine Clearance: 62.6 mL/min (by C-G formula based on Cr of 1.22).   Medical History: Past Medical History  Diagnosis Date  . Asthma   . Lupus (systemic lupus erythematosus) ([place])   . PVD (peripheral vascular disease) ([facility])     60% R renal artery stenosis; non-functioning L kidney  . Hypertension   . Dyslipidemia   . Chronic anticoagulation   . History of echocardiogram 11/08/2011    EF >55%; mild-mod concentric LVH; trace aortic regurg.; LA mild-mod dilated  . History of nuclear stress test 01/20/2010    normal pattern of perfusion; low risk scan  . H/O Doppler ultrasound 10/03/2010    carotid duplex - R ICA normal patency; L CCA-ICA bypass gradt patent common to interal cartoid bypass graft   . H/O mechanical aortic valve replacement   . Complete heart block ([place]) 1//2015    Implantation of a dual chamber pacemaker on 03-06-2013 by Dr Rayann Heman.  The patient received a STJ model number [id number] pacemaker with model number 2088 right ventricular lead.   . Carotid artery disease ([place])    Assessment: 69 yoF presented to the ED with SOB, found to be having a CHF exacerbation w/ ~16 lb weight gain since last clinic visit in 11/2014. She is on chronic warfarin for history of mechanical AVR. INR is now subtherapeutic at 2.48. Hgb 11.3, Plt 201. No bleeding noted.        PTA warfarin: 5 mg MWFSa, 7.5 mg  TThSu  Goal of Therapy:  INR 2.5-3.5 Monitor platelets by anticoagulation protocol: Yes   Plan:  - Repeat Warfarin 7.5 mg PO x 1 tonight as INR now subtherapeutic despite resumption of home dose - Daily INR, CBC q72h - Monitor for s/sx of bleeding  Governor Specking, PharmD Clinical Pharmacy Resident Pager: [phone] 01/28/2015,1:49 PM

## 2015-01-29 LAB — HEMOGLOBIN A1C
Hgb A1c MFr Bld: 6 % — ABNORMAL HIGH (ref 4.8–5.6)
Mean Plasma Glucose: 126 mg/dL

## 2015-01-29 LAB — PROTIME-INR
INR: 2.57 — ABNORMAL HIGH (ref 0.00–1.49)
Prothrombin Time: 27.3 seconds — ABNORMAL HIGH (ref 11.6–15.2)

## 2015-01-29 LAB — BASIC METABOLIC PANEL
ANION GAP: 8 (ref 5–15)
BUN: 22 mg/dL — ABNORMAL HIGH (ref 6–20)
CO2: 30 mmol/L (ref 22–32)
Calcium: 9 mg/dL (ref 8.9–10.3)
Chloride: 102 mmol/L (ref 101–111)
Creatinine, Ser: 1.35 mg/dL — ABNORMAL HIGH (ref 0.44–1.00)
GFR calc Af Amer: 48 mL/min — ABNORMAL LOW (ref >60)
GFR, EST NON AFRICAN AMERICAN: 41 mL/min — AB (ref >60)
GLUCOSE: 113 mg/dL — AB (ref 65–99)
POTASSIUM: 4 mmol/L (ref 3.5–5.1)
Sodium: 140 mmol/L (ref 135–145)

## 2015-01-29 MED ORDER — WARFARIN SODIUM 7.5 MG PO TABS
7.5000 mg | ORAL_TABLET | Freq: Once | ORAL | Status: AC
  Administered 2015-01-29: 7.5 mg via ORAL
  Filled 2015-01-29: qty 1

## 2015-01-29 NOTE — Progress Notes (Signed)
ANTICOAGULATION CONSULT NOTE - Initial Consult  Pharmacy Consult for warfarin Indication: mechanical AVR  No Known Allergies  Patient Measurements: Height: 5\' 5"  (165.1 cm) Weight: 268 lb 1.6 oz (121.609 kg) (Scale C) IBW/kg (Calculated) : 57 Heparin Dosing Weight:   Vital Signs: Temp: 98 F (36.7 C) (12/10 1222) Temp Source: Oral (12/10 1222) BP: 128/63 mmHg (12/10 1222) Pulse Rate: 77 (12/10 1222)  Labs:  Recent Labs  01/27/15 1713 01/28/15 0344 01/29/15 0308  HGB 11.3*  --   --   HCT 36.3  --   --   PLT 201  --   --   LABPROT 27.5* 26.5* 27.3*  INR 2.60* 2.48* 2.57*  CREATININE 1.23* 1.22* 1.35*    Estimated Creatinine Clearance: 56.5 mL/min (by C-G formula based on Cr of 1.35).   Medical History: Past Medical History  Diagnosis Date  . Asthma   . Lupus (systemic lupus erythematosus) ([place])   . PVD (peripheral vascular disease) ([facility])     60% R renal artery stenosis; non-functioning L kidney  . Hypertension   . Dyslipidemia   . Chronic anticoagulation   . History of echocardiogram 11/08/2011    EF >55%; mild-mod concentric LVH; trace aortic regurg.; LA mild-mod dilated  . History of nuclear stress test 01/20/2010    normal pattern of perfusion; low risk scan  . H/O Doppler ultrasound 10/03/2010    carotid duplex - R ICA normal patency; L CCA-ICA bypass gradt patent common to interal cartoid bypass graft   . H/O mechanical aortic valve replacement   . Complete heart block ([place]) 1//2015    Implantation of a dual chamber pacemaker on 03-06-2013 by Dr Rayann Heman.  The patient received a STJ model number [id number] pacemaker with model number 2088 right ventricular lead.   . Carotid artery disease ([place])    Assessment: 53 yoF presented to the ED with SOB, found to be having a CHF exacerbation w/ ~16 lb weight gain since last clinic visit in 11/2014. Now down 12 lbs since admit. Warfarin 5mg  daily exc 7.5mg  on Tues/Thurs/Sun for hx mech AVR (INR goal 2.5-3.5) INR on admit was  2.48. Now INR is therapeutic at 2.57. Hgb at 11.3, plts wnl. No s/s of bleed  Goal of Therapy:  INR 2.5-3.5 Monitor platelets by anticoagulation protocol: Yes   Plan:  Give coumadin 7.5mg  PO x 1 Monitor daily INR, CBC, s/s of bleed  Elenor Quinones, PharmD, BCPS Clinical Pharmacist Pager [phone] 01/29/2015 1:14 PM

## 2015-01-29 NOTE — Progress Notes (Signed)
Patient ID: [name], female   DOB: Dec 09, 1950, 64 y.o.   MRN: [id number]    Subjective:    SOB improving but not resolved  Objective:   Temp:  [97.3 F (36.3 C)-98.1 F (36.7 C)] 97.3 F (36.3 C) (12/10 0930) Pulse Rate:  [68-76] 72 (12/10 0930) Resp:  [16-20] 16 (12/10 0930) BP: (121-138)/(52-61) 138/55 mmHg (12/10 0930) SpO2:  [96 %-100 %] 96 % (12/10 0930) Weight:  [268 lb 1.6 oz (121.609 kg)] 268 lb 1.6 oz (121.609 kg) (12/10 0412) Last BM Date: 01/27/15  Filed Weights   01/27/15 2202 01/28/15 0517 01/29/15 0412  Weight: 291 lb 7.2 oz (132.2 kg) 269 lb 2.9 oz (122.1 kg) 268 lb 1.6 oz (121.609 kg)    Intake/Output Summary (Last 24 hours) at 01/29/15 1121 Last data filed at 01/29/15 0800  Gross per 24 hour  Intake   1080 ml  Output   3200 ml  Net  -2120 ml    Telemetry: V-paced  Exam:  General: NAD  Resp: crackles bilateral bases  Cardiac: RRR, 2/6 systolic murmur RUSB, JVD to angle of jaw  GI: abdomen soft, NT, ND  MSK: 1+ bilateral LE edema  Neuro: no focal deficits  Psych: appropriate affect  Lab Results:  Basic Metabolic Panel:  Recent Labs Lab 01/27/15 1713 01/28/15 0344 01/29/15 0308  NA 137 139 140  K 4.3 3.3* 4.0  CL 104 105 102  CO2 25 28 30   GLUCOSE 119* 103* 113*  BUN 18 19 22*  CREATININE 1.23* 1.22* 1.35*  CALCIUM 8.9 8.7* 9.0    Liver Function Tests:  Recent Labs Lab 01/27/15 1713  AST 43*  ALT 44  ALKPHOS 81  BILITOT 1.2  PROT 6.9  ALBUMIN 3.5    CBC:  Recent Labs Lab 01/27/15 1713  WBC 8.0  HGB 11.3*  HCT 36.3  MCV 89.2  PLT 201    Cardiac Enzymes: No results for input(s): CKTOTAL, CKMB, CKMBINDEX, TROPONINI in the last 168 hours.  BNP: No results for input(s): PROBNP in the last 8760 hours.  Coagulation:  Recent Labs Lab 01/27/15 1713 01/28/15 0344 01/29/15 0308  INR 2.60* 2.48* 2.57*    ECG:   Medications:   Scheduled Medications: . acidophilus  1 capsule Oral Daily  .  allopurinol  100 mg Oral Daily  . calcium carbonate  1 tablet Oral BID  . cholecalciferol  1,000 Units Oral BID  . furosemide  40 mg Intravenous BID  . hydroxychloroquine  200 mg Oral BID  . loratadine  10 mg Oral Daily  . mometasone-formoterol  2 puff Inhalation BID  . predniSONE  5 mg Oral Daily  . simvastatin  20 mg Oral QHS  . sodium chloride  3 mL Intravenous Q12H  . Warfarin - Pharmacist Dosing Inpatient   Does not apply q1800     Infusions:     PRN Medications:  sodium chloride, acetaminophen, ALPRAZolam, nitroGLYCERIN, ondansetron (ZOFRAN) IV, polyvinyl alcohol, sodium chloride, zolpidem     Assessment/Plan    1. Acute on chronic diasotlic HF - 123XX123 echo LVEF 50-55%, abnormal diastolic function, nomral AVR - acute exacerbation due to high sodium intake at home - negative 1.7 liters yesterday, negative 3 liters since admission. She is on lasix 40mg  IV bid, mild uptrend in Cr and BUN today. Weights probably inaccurate, they indicate 23 lbs weight since admission. Her weight at outpatient visit 11/2014 was 264 lbs, she is down to 268 lbs.   - continue IV diruetics  today, likely change to oral tomorrow. Will get her up and walking today.   2. Mechanical AVR - continue coumadin       Carlyle Dolly, M.D.

## 2015-01-30 LAB — CBC
HCT: 32.9 % — ABNORMAL LOW (ref 36.0–46.0)
Hemoglobin: 10.4 g/dL — ABNORMAL LOW (ref 12.0–15.0)
MCH: 27.7 pg (ref 26.0–34.0)
MCHC: 31.6 g/dL (ref 30.0–36.0)
MCV: 87.5 fL (ref 78.0–100.0)
PLATELETS: 198 10*3/uL (ref 150–400)
RBC: 3.76 MIL/uL — AB (ref 3.87–5.11)
RDW: 16.7 % — ABNORMAL HIGH (ref 11.5–15.5)
WBC: 5.6 10*3/uL (ref 4.0–10.5)

## 2015-01-30 LAB — BASIC METABOLIC PANEL
Anion gap: 12 (ref 5–15)
BUN: 26 mg/dL — ABNORMAL HIGH (ref 6–20)
CALCIUM: 8.9 mg/dL (ref 8.9–10.3)
CHLORIDE: 99 mmol/L — AB (ref 101–111)
CO2: 28 mmol/L (ref 22–32)
CREATININE: 1.44 mg/dL — AB (ref 0.44–1.00)
GFR, EST AFRICAN AMERICAN: 44 mL/min — AB (ref >60)
GFR, EST NON AFRICAN AMERICAN: 38 mL/min — AB (ref >60)
Glucose, Bld: 90 mg/dL (ref 65–99)
Potassium: 3.6 mmol/L (ref 3.5–5.1)
SODIUM: 139 mmol/L (ref 135–145)

## 2015-01-30 LAB — PROTIME-INR
INR: 2.69 — AB (ref 0.00–1.49)
Prothrombin Time: 28.2 seconds — ABNORMAL HIGH (ref 11.6–15.2)

## 2015-01-30 MED ORDER — ACYCLOVIR 5 % EX CREA
TOPICAL_CREAM | CUTANEOUS | Status: DC
  Filled 2015-01-30: qty 5

## 2015-01-30 MED ORDER — ACYCLOVIR 5 % EX OINT: TOPICAL_OINTMENT | CUTANEOUS | Status: DC

## 2015-01-30 MED ORDER — ACYCLOVIR 5 % EX OINT
TOPICAL_OINTMENT | CUTANEOUS | Status: DC
  Administered 2015-01-30: 10:00:00 via TOPICAL
  Filled 2015-01-30: qty 15

## 2015-01-30 NOTE — Progress Notes (Signed)
Pt discharged home, IV and tele removed. Discharge instructions given, questions answered.

## 2015-01-30 NOTE — Progress Notes (Signed)
Patient ID: MELVIN PRIMEAUX, female   DOB: 20-Dec-1950, 64 y.o.   MRN: [id number]     Subjective:   SOB improved   Objective:   Temp:  [97.3 F (36.3 C)-98.2 F (36.8 C)] 98.2 F (36.8 C) (12/11 0341) Pulse Rate:  [65-77] 65 (12/11 0341) Resp:  [16-18] 16 (12/10 2116) BP: (110-138)/(40-63) 111/43 mmHg (12/11 0341) SpO2:  [96 %-100 %] 97 % (12/11 0341) Weight:  [264 lb 6.4 oz (119.931 kg)] 264 lb 6.4 oz (119.931 kg) (12/11 0341) Last BM Date: 01/29/15  Filed Weights   01/28/15 0517 01/29/15 0412 01/30/15 0341  Weight: 269 lb 2.9 oz (122.1 kg) 268 lb 1.6 oz (121.609 kg) 264 lb 6.4 oz (119.931 kg)    Intake/Output Summary (Last 24 hours) at 01/30/15 0903 Last data filed at 01/30/15 0600  Gross per 24 hour  Intake    240 ml  Output   4225 ml  Net  -3985 ml    Exam:  General: NAD  Resp: CTAB  Cardiac: RRR, no m/r/g, noj vd  GI: abdomen soft, NT, ND  MSK: no LE edema  Neuro: no focal deficits  Psych: appropriate affect  Lab Results:  Basic Metabolic Panel:  Recent Labs Lab 01/28/15 0344 01/29/15 0308 01/30/15 0500  NA 139 140 139  K 3.3* 4.0 3.6  CL 105 102 99*  CO2 28 30 28   GLUCOSE 103* 113* 90  BUN 19 22* 26*  CREATININE 1.22* 1.35* 1.44*  CALCIUM 8.7* 9.0 8.9    Liver Function Tests:  Recent Labs Lab 01/27/15 1713  AST 43*  ALT 44  ALKPHOS 81  BILITOT 1.2  PROT 6.9  ALBUMIN 3.5    CBC:  Recent Labs Lab 01/27/15 1713 01/30/15 0500  WBC 8.0 5.6  HGB 11.3* 10.4*  HCT 36.3 32.9*  MCV 89.2 87.5  PLT 201 198    Cardiac Enzymes: No results for input(s): CKTOTAL, CKMB, CKMBINDEX, TROPONINI in the last 168 hours.  BNP: No results for input(s): PROBNP in the last 8760 hours.  Coagulation:  Recent Labs Lab 01/28/15 0344 01/29/15 0308 01/30/15 0500  INR 2.48* 2.57* 2.69*    ECG:   Medications:   Scheduled Medications: . acidophilus  1 capsule Oral Daily  . allopurinol  100 mg Oral Daily  . calcium carbonate  1 tablet  Oral BID  . cholecalciferol  1,000 Units Oral BID  . furosemide  40 mg Intravenous BID  . hydroxychloroquine  200 mg Oral BID  . loratadine  10 mg Oral Daily  . mometasone-formoterol  2 puff Inhalation BID  . predniSONE  5 mg Oral Daily  . simvastatin  20 mg Oral QHS  . sodium chloride  3 mL Intravenous Q12H  . Warfarin - Pharmacist Dosing Inpatient   Does not apply q1800     Infusions:     PRN Medications:  sodium chloride, acetaminophen, ALPRAZolam, nitroGLYCERIN, ondansetron (ZOFRAN) IV, polyvinyl alcohol, sodium chloride, zolpidem     Assessment/Plan    1. Acute on chronic diasotlic HF - 123XX123 echo LVEF 50-55%, abnormal diastolic function, nomral AVR - acute exacerbation due to high sodium intake at home - negative 4 liters yesterday, negative 7 liters since admission. She is on lasix 40mg  IV bid, mild uptrend in Cr and BUN today. We will stop IV diuretics, start oral lasix 40mg  daily her prior home dose. Her exacerbation was more related to dietary indiscretion as opposed to diuretic failure.  - counseled on home weights and adjusting her  home lasix accordingly.    2. Mechanical AVR - continue coumadin  3. Herpes labialis - has developed multiple cold sores, will start acyclovir cream. Will need Rx for discharge.    Plan for discharge today. She will need f/u in 2 weeks.    Carlyle Dolly, M.D.

## 2015-01-30 NOTE — Discharge Summary (Signed)
Physician Discharge Summary    Cardiologist:  Gwenlyn Found  Patient ID: [name] MRN: [id number] DOB/AGE: 62-May-1952 62 y.o.  Admit date: 01/27/2015 Discharge date: 01/30/2015  Admission Diagnoses:  Acute on chronic diastolic heart failure  Discharge Diagnoses:  Active Problems:   S/P AVR (aortic valve replacement)   Long term (current) use of anticoagulants - Coumadin for mechanical AoV   Essential hypertension   Renal artery stenosis ([facility])   Morbid obesity ([facility])   Acute on chronic diastolic CHF (congestive heart failure) ([facility])   Hypokalemia   herpes labialis   Discharged Condition: stable  Hospital Course:   [name] is a 64 y.o. female with a history of mech AVR, CHB w/ [employer] PPM, D-CHF, PAD (L-ICA 100%), SLE, Obesity. Nl cors 2002 and low risk MV 2011.  [name] was in [employer]. She admits to dietary indiscretion and having her legs down most of the day. She does not weigh at home and was not weighing while on vacation.   After she came back, on Monday she started noticing increased DOE and fatigue. Her LE edema was worse than usual. She developed orthopnea and PND over the last 2 nights. She went to her primary MD and her oxygen levels were low, so she was sent by EMS to the ER. In the ER, she has received 40 mg Lasix IV and is urinating frequently. She feels tired going to and from the BR and is SOB moving herself around in the bed.   Patient was admitted for IV diuresis with Lasix 40 mg twice daily. Her weight was up 16 pounds from her previous office visit back in October.  She ultimately diuresed 7 L.  She had mild increase in serum creatinine and BUN. IV Lasix was stopped and she was restarted on 40 mg daily which was her prior home dose. Current exacerbation was likely related to dietary indiscretion she was on vacation. She was counseled on home weights and adjusting her home Lasix accordingly. She was continued on Coumadin for mechanical aVR.  She also developed  herpes labialis with multiple cold sores. She started acyclovir cream.The patient was seen by Dr. Harl Bowie who felt she was stable for DC home.    Consults: None  Significant Diagnostic Studies:  ABDOMEN - 2 VIEW  COMPARISON: None.  FINDINGS: Scattered large and small bowel gas is noted. No free air is seen. No abnormal mass or abnormal calcifications are noted. Degenerative changes of the lumbar spine are seen.  IMPRESSION: No acute abnormality noted.  EXAM: CHEST 2 VIEW  COMPARISON: 11/25/2013 and 05/07/2013.  FINDINGS: Left subclavian pacemaker leads extend into the right atrium and right ventricle. There is stable cardiomegaly status post median sternotomy and probable aortic valve replacement. There is increased vascular congestion with mild interstitial edema. There is no confluent airspace opacity, significant pleural effusion or pneumothorax. There is asymmetric sclerosis of the right humeral head which may indicate avascular necrosis. Rounded density overlying the scapular neck could reflect a loose body in the superior subscapularis recess.  IMPRESSION: 1. Cardiomegaly with mild interstitial pulmonary edema consistent with congestive heart failure. 2. Probable chronic right humeral head AVN with loose body. This has been previously evaluated by MRI 01/11/2010.  Treatments:  See Above   Discharge Exam: Blood pressure 114/46, pulse 66, temperature 98.3 F (36.8 C), temperature source Oral, resp. rate 18, height 5\' 5"  (1.651 m), weight 264 lb 6.4 oz (119.931 kg), SpO2 98 %.   Disposition: 01-Home or  Self Care      Discharge Instructions    Diet - low sodium heart healthy    Complete by:  As directed      Discharge instructions    Complete by:  As directed   Monitor your weight every morning.  If you gain 3 pounds in 24 hours, or 5 pounds in a week, call the office for instructions.            Medication List    TAKE these medications         acyclovir ointment 5 %  Commonly known as:  ZOVIRAX  Apply topically every 4 (four) hours.     ADVAIR DISKUS 250-50 MCG/DOSE Aepb  Generic drug:  Fluticasone-Salmeterol  Use 1 inhalation by mouth  two times daily     allopurinol 100 MG tablet  Commonly known as:  ZYLOPRIM  Take 1 tablet by mouth daily.     CALTRATE 600 1500 (600 CA) MG Tabs tablet  Generic drug:  calcium carbonate  Take 1 tablet by mouth 2 (two) times daily.     cholecalciferol 1000 UNITS tablet  Commonly known as:  VITAMIN D  Take 1,000 Units by mouth 2 (two) times daily.     furosemide 40 MG tablet  Commonly known as:  LASIX  Take 40 mg by mouth daily.     hydroxychloroquine 200 MG tablet  Commonly known as:  PLAQUENIL  Take 1 tablet (200 mg total) by mouth 2 (two) times daily.     hydroxypropyl methylcellulose / hypromellose 2.5 % ophthalmic solution  Commonly known as:  ISOPTO TEARS / GONIOVISC  Place 1 drop into both eyes daily as needed for dry eyes.     loratadine 10 MG tablet  Commonly known as:  CLARITIN  Take 10 mg by mouth daily.     predniSONE 5 MG tablet  Commonly known as:  DELTASONE  Take 5 mg by mouth daily.     PROBIOTIC DAILY PO  Take 1 tablet by mouth daily.     simvastatin 20 MG tablet  Commonly known as:  ZOCOR  Take 1 tablet (20 mg total) by mouth at bedtime.     warfarin 5 MG tablet  Commonly known as:  COUMADIN  Take 5-7.5 mg by mouth daily. Take 5mg  on Mon, wed, fri and saturdays. The other days take a tablet and a half        Greater than 30 minutes was spent completing the patient's discharge.    SignedTarri Fuller, [place] 01/30/2015, 9:52 AM

## 2015-02-02 ENCOUNTER — Other Ambulatory Visit: Payer: Self-pay | Admitting: Cardiovascular Disease

## 2015-02-02 DIAGNOSIS — I701 Atherosclerosis of renal artery: Secondary | ICD-10-CM

## 2015-02-10 ENCOUNTER — Encounter: Payer: Self-pay | Admitting: Cardiology

## 2015-02-10 ENCOUNTER — Ambulatory Visit (INDEPENDENT_AMBULATORY_CARE_PROVIDER_SITE_OTHER): Payer: Medicare Other | Admitting: Cardiology

## 2015-02-10 ENCOUNTER — Ambulatory Visit (HOSPITAL_COMMUNITY)
Admission: RE | Admit: 2015-02-10 | Discharge: 2015-02-10 | Disposition: A | Discharge status: Home / Self Care | Payer: Medicare Other | Source: Ambulatory Visit | Attending: Cardiovascular Disease | Admitting: Cardiovascular Disease

## 2015-02-10 VITALS — BP 128/90 | HR 84 | Ht 65.0 in | Wt 268.1 lb

## 2015-02-10 DIAGNOSIS — I700 Atherosclerosis of aorta: Secondary | ICD-10-CM | POA: Insufficient documentation

## 2015-02-10 DIAGNOSIS — N261 Atrophy of kidney (terminal): Secondary | ICD-10-CM | POA: Diagnosis not present

## 2015-02-10 DIAGNOSIS — I701 Atherosclerosis of renal artery: Secondary | ICD-10-CM

## 2015-02-10 DIAGNOSIS — Z0389 Encounter for observation for other suspected diseases and conditions ruled out: Secondary | ICD-10-CM

## 2015-02-10 DIAGNOSIS — Z952 Presence of prosthetic heart valve: Secondary | ICD-10-CM

## 2015-02-10 DIAGNOSIS — N183 Chronic kidney disease, stage 3 unspecified: Secondary | ICD-10-CM | POA: Insufficient documentation

## 2015-02-10 DIAGNOSIS — I10 Essential (primary) hypertension: Secondary | ICD-10-CM | POA: Insufficient documentation

## 2015-02-10 DIAGNOSIS — N289 Disorder of kidney and ureter, unspecified: Secondary | ICD-10-CM

## 2015-02-10 DIAGNOSIS — M329 Systemic lupus erythematosus, unspecified: Secondary | ICD-10-CM

## 2015-02-10 DIAGNOSIS — I519 Heart disease, unspecified: Secondary | ICD-10-CM | POA: Diagnosis not present

## 2015-02-10 DIAGNOSIS — Z954 Presence of other heart-valve replacement: Secondary | ICD-10-CM

## 2015-02-10 DIAGNOSIS — I6523 Occlusion and stenosis of bilateral carotid arteries: Secondary | ICD-10-CM

## 2015-02-10 DIAGNOSIS — N2889 Other specified disorders of kidney and ureter: Secondary | ICD-10-CM | POA: Insufficient documentation

## 2015-02-10 DIAGNOSIS — Z950 Presence of cardiac pacemaker: Secondary | ICD-10-CM

## 2015-02-10 DIAGNOSIS — IMO0001 Reserved for inherently not codable concepts without codable children: Secondary | ICD-10-CM

## 2015-02-10 DIAGNOSIS — I5189 Other ill-defined heart diseases: Secondary | ICD-10-CM

## 2015-02-10 DIAGNOSIS — I774 Celiac artery compression syndrome: Secondary | ICD-10-CM | POA: Insufficient documentation

## 2015-02-10 DIAGNOSIS — E785 Hyperlipidemia, unspecified: Secondary | ICD-10-CM | POA: Diagnosis not present

## 2015-02-10 DIAGNOSIS — Z7901 Long term (current) use of anticoagulants: Secondary | ICD-10-CM

## 2015-02-10 LAB — BASIC METABOLIC PANEL
BUN: 28 mg/dL — ABNORMAL HIGH (ref 7–25)
CO2: 27 mmol/L (ref 20–31)
Calcium: 9.2 mg/dL (ref 8.6–10.4)
Chloride: 103 mmol/L (ref 98–110)
Creat: 1.2 mg/dL — ABNORMAL HIGH (ref 0.50–0.99)
Glucose, Bld: 96 mg/dL (ref 65–99)
Potassium: 4.4 mmol/L (ref 3.5–5.3)
Sodium: 141 mmol/L (ref 135–146)

## 2015-02-10 NOTE — Assessment & Plan Note (Signed)
CHB

## 2015-02-10 NOTE — Assessment & Plan Note (Signed)
BMI 44 

## 2015-02-10 NOTE — Assessment & Plan Note (Signed)
Chronic steroids

## 2015-02-10 NOTE — Progress Notes (Signed)
02/10/2015 MIO PLACIDE   1950/08/25  [id number]  Primary Physician [name], MD Primary Cardiologist: Dr Gwenlyn Found  HPI:  Pleasant morbidly obese Caucasian female followed by Dr Gwenlyn Found. She had a St Jude AVR in 2006. She had normal coronaries then and had a low risk Myoview in 2011. An echo done in Oct 2016  Showed an EF of 99991111 with diastolic dysfunction. Velocities across the prosthetic valve showed no signs of obstruction.  She did have a pacemaker placed in Jan 2015 for CHB. In addition she has PVD with a 60% Rt RA narrowing and a non functioning Lt kidney. Dopplers in April 2016 show the Rt RA to be patent. She has an occluded LICA, s/p LCE in [id number], with a patent RICA.            She is in the office today for follow up after a recent admission for acute diastolic CHF after a trip to [employer]. She was 280lbs on admission, 264 at discharge. She has been doing well since discharge, watching her sodium intake closely.    Current Outpatient Prescriptions  Medication Sig Dispense Refill  . ADVAIR DISKUS 250-50 MCG/DOSE AEPB Use 1 inhalation by mouth  two times daily 180 each 1  . allopurinol (ZYLOPRIM) 100 MG tablet Take 1 tablet by mouth daily.  3  . Calcium Carbonate (CALTRATE 600) 1500 MG TABS Take 1 tablet by mouth 2 (two) times daily.      . cholecalciferol (VITAMIN D) 1000 UNITS tablet Take 1,000 Units by mouth 2 (two) times daily.    . furosemide (LASIX) 40 MG tablet Take 40 mg by mouth daily.    . hydroxychloroquine (PLAQUENIL) 200 MG tablet Take 1 tablet (200 mg total) by mouth 2 (two) times daily. 60 tablet 11  . hydroxypropyl methylcellulose / hypromellose (ISOPTO TEARS / GONIOVISC) 2.5 % ophthalmic solution Place 1 drop into both eyes daily as needed for dry eyes.    [name] loratadine (CLARITIN) 10 MG tablet Take 10 mg by mouth daily.    . predniSONE (DELTASONE) 5 MG tablet Take 5 mg by mouth daily.      . Probiotic Product (PROBIOTIC DAILY PO) Take 1 tablet by mouth  daily.    . simvastatin (ZOCOR) 20 MG tablet Take 1 tablet (20 mg total) by mouth at bedtime. 90 tablet 2  . warfarin (COUMADIN) 5 MG tablet Take 5-7.5 mg by mouth daily. Take 5mg  on Mon, wed, fri and saturdays. The other days take a tablet and a half     No current facility-administered medications for this visit.    No Known Allergies  Social History   Social History  . Marital Status: Divorced    Spouse Name: N/A  . Number of Children: 3  . Years of Education: N/A   Occupational History  . Retired    Social History Main Topics  . Smoking status: Never Smoker   . Smokeless tobacco: Never Used  . Alcohol Use: No  . Drug Use: No  . Sexual Activity: Not on file   Other Topics Concern  . Not on file   Social History Narrative   Lives alone in [place].     Review of Systems: General: negative for chills, fever, night sweats or weight changes.  Cardiovascular: negative for chest pain, dyspnea on exertion, edema, orthopnea, palpitations, paroxysmal nocturnal dyspnea or shortness of breath Dermatological: negative for rash Respiratory: negative for cough or wheezing Urologic: negative for hematuria Abdominal: negative for nausea,  vomiting, diarrhea, bright red blood per rectum, melena, or hematemesis Neurologic: negative for visual changes, syncope, or dizziness All other systems reviewed and are otherwise negative except as noted above.    Blood pressure 128/90, pulse 84, height 5\' 5"  (1.651 m), weight 268 lb 1 oz (121.592 kg).  General appearance: alert, cooperative, no distress and morbidly obese Lungs: clear to auscultation bilaterally Heart: regular rate and rhythm and positive valve sounds Extremities: trace edema Neurologic: Grossly normal   ASSESSMENT AND PLAN:   Acute on chronic diastolic CHF (congestive heart failure) ([place]) Hospitalized 01/27/15-01/30/15 after trip to [place]  Diastolic dysfunction Nl LVF with diastolic dysfunction by echo Oct  2016  Renal artery stenosis (HCC) Known non functioning Lt kidney, 60% Rt RAS Dopplers done today- results pending  Renal insufficiency DC SCr was up to 1.44- will re check today  Normal coronary arteries 2006, negative Myoview 2011  Long term (current) use of anticoagulants - Coumadin for mechanical AoV .  LUPUS (SLE) Chronic steroids  S/P AVR (aortic valve replacement) St Jude AVR 2006  Morbid obesity (HCC) BMI 44  Cardiac pacemaker in situ - St Jude CHB   PLAN  Her wgt is stable. Same Rx. I will arrange for her to have her carotid and renal dopplers done at that the same time.   Kerin Ransom K PA-C 02/10/2015 2:08 PM

## 2015-02-10 NOTE — Assessment & Plan Note (Signed)
DC SCr was up to 1.44- will re check today

## 2015-02-10 NOTE — Assessment & Plan Note (Signed)
St Jude AVR 2006

## 2015-02-10 NOTE — Assessment & Plan Note (Signed)
Hospitalized 01/27/15-01/30/15 after trip to [employer]

## 2015-02-10 NOTE — Assessment & Plan Note (Signed)
Known non functioning Lt kidney, 60% Rt RAS Dopplers done today- results pending

## 2015-02-10 NOTE — Assessment & Plan Note (Signed)
Nl LVF with diastolic dysfunction by echo Oct 2016

## 2015-02-10 NOTE — Assessment & Plan Note (Signed)
2006, negative Myoview 2011

## 2015-02-10 NOTE — Patient Instructions (Addendum)
Your physician has requested that you have a renal artery duplex. During this test, an ultrasound is used to evaluate blood flow to the kidneys. Allow one hour for this exam. Do not eat after midnight the day before and avoid carbonated beverages. Take your medications as you usually do.  Your physician has requested that you have a carotid duplex. This test is an ultrasound of the carotid arteries in your neck. It looks at blood flow through these arteries that supply the brain with blood. Allow one hour for this exam. There are no restrictions or special instructions.  These studies will be done in June 2017. You will receive notification when they are due.  Your physician wants you to follow-up in: 6 months after your tests or sooner with Dr Andria Rhein will receive a reminder letter in the mail two months in advance. If you don't receive a letter, please call our office to schedule the follow-up appointment.  If you need a refill on your cardiac medications before your next appointment, please call your pharmacy.  Your physician recommends that you return for lab work in: [place]

## 2015-02-16 ENCOUNTER — Telehealth: Payer: Self-pay

## 2015-02-16 DIAGNOSIS — I10 Essential (primary) hypertension: Secondary | ICD-10-CM

## 2015-02-16 NOTE — Telephone Encounter (Signed)
-----   Message from [name], MD sent at 02/14/2015  6:49 AM EST ----- No change from prior study. Repeat in 12 months.

## 2015-02-21 ENCOUNTER — Other Ambulatory Visit: Payer: Self-pay | Admitting: Cardiovascular Disease

## 2015-02-22 NOTE — Telephone Encounter (Signed)
,  Rx request sent to pharmacy.  

## 2015-03-09 ENCOUNTER — Other Ambulatory Visit: Payer: Self-pay | Admitting: Cardiovascular Disease

## 2015-03-10 LAB — PROTIME-INR
INR: 2.6 — ABNORMAL HIGH (ref 0.8–1.2)
PROTHROMBIN TIME: 26.3 s — AB (ref 9.1–12.0)

## 2015-03-11 ENCOUNTER — Ambulatory Visit (INDEPENDENT_AMBULATORY_CARE_PROVIDER_SITE_OTHER): Payer: Medicare Other | Admitting: Pharmacist Clinician (PhC)/ Clinical Pharmacy Specialist

## 2015-03-11 DIAGNOSIS — Z952 Presence of prosthetic heart valve: Secondary | ICD-10-CM

## 2015-03-11 DIAGNOSIS — Z5181 Encounter for therapeutic drug level monitoring: Secondary | ICD-10-CM

## 2015-03-11 DIAGNOSIS — Z954 Presence of other heart-valve replacement: Secondary | ICD-10-CM

## 2015-04-08 ENCOUNTER — Encounter: Payer: Self-pay | Admitting: Internal Medicine

## 2015-04-08 ENCOUNTER — Telehealth: Payer: Self-pay | Admitting: *Deleted

## 2015-04-08 DIAGNOSIS — I4729 Other ventricular tachycardia: Secondary | ICD-10-CM

## 2015-04-08 DIAGNOSIS — I472 Ventricular tachycardia: Secondary | ICD-10-CM

## 2015-04-08 MED ORDER — METOPROLOL SUCCINATE ER 25 MG PO TB24: 25.0000 mg | ORAL_TABLET | Freq: Every day | ORAL | Status: DC

## 2015-04-08 NOTE — Telephone Encounter (Signed)
Reviewed episode with Dr. Rayann Heman- he would like to add Toprol XL 25mg  oral daily to her medication regimen. I will send it to [facility] in [place] per pt request. He would also like her to see Chanetta Marshall, NP within the next 3 months. I will have scheduler call her to go ahead an set that up. She is appreciative.

## 2015-04-08 NOTE — Telephone Encounter (Signed)
Called patient to inquire if she had symptoms during a high ventricular rate on Saturday 03/26/15 about 3 pm. She does not recall being symptomatic but is unsure of what she was doing that day. I told her that I would review the episode with Dr. Rayann Heman and call her back if he made any recommendations. She is agreeable.

## 2015-04-12 ENCOUNTER — Other Ambulatory Visit: Payer: Self-pay | Admitting: Cardiovascular Disease

## 2015-04-13 ENCOUNTER — Other Ambulatory Visit: Payer: Self-pay | Admitting: Nurse Practitioner

## 2015-04-13 ENCOUNTER — Encounter: Payer: Self-pay | Admitting: Nurse Practitioner

## 2015-04-13 ENCOUNTER — Ambulatory Visit (INDEPENDENT_AMBULATORY_CARE_PROVIDER_SITE_OTHER): Payer: Medicare Other | Admitting: Nurse Practitioner

## 2015-04-13 ENCOUNTER — Ambulatory Visit (INDEPENDENT_AMBULATORY_CARE_PROVIDER_SITE_OTHER): Payer: Medicare Other | Admitting: *Deleted

## 2015-04-13 VITALS — BP 110/78 | HR 71 | Resp 22 | Ht 65.0 in | Wt 265.0 lb

## 2015-04-13 DIAGNOSIS — R0600 Dyspnea, unspecified: Secondary | ICD-10-CM

## 2015-04-13 DIAGNOSIS — I442 Atrioventricular block, complete: Secondary | ICD-10-CM

## 2015-04-13 LAB — BASIC METABOLIC PANEL
BUN: 29 mg/dL — ABNORMAL HIGH (ref 7–25)
CO2: 27 mmol/L (ref 20–31)
Calcium: 9.7 mg/dL (ref 8.6–10.4)
Chloride: 102 mmol/L (ref 98–110)
Creat: 1.5 mg/dL — ABNORMAL HIGH (ref 0.50–0.99)
Glucose, Bld: 100 mg/dL — ABNORMAL HIGH (ref 65–99)
Potassium: 4.2 mmol/L (ref 3.5–5.3)
Sodium: 140 mmol/L (ref 135–146)

## 2015-04-13 LAB — HEPATIC FUNCTION PANEL
ALT: 30 U/L — ABNORMAL HIGH (ref 6–29)
AST: 27 U/L (ref 10–35)
Albumin: 4.3 g/dL (ref 3.6–5.1)
Alkaline Phosphatase: 81 U/L (ref 33–130)
Bilirubin, Direct: 0.2 mg/dL (ref <=0.2)
Indirect Bilirubin: 0.4 mg/dL (ref 0.2–1.2)
Total Bilirubin: 0.6 mg/dL (ref 0.2–1.2)
Total Protein: 8 g/dL (ref 6.1–8.1)

## 2015-04-13 LAB — POCT INR: INR: 3.1

## 2015-04-13 LAB — CBC
HCT: 38.6 % (ref 36.0–46.0)
Hemoglobin: 12.4 g/dL (ref 12.0–15.0)
MCH: 27.6 pg (ref 26.0–34.0)
MCHC: 32.1 g/dL (ref 30.0–36.0)
MCV: 86 fL (ref 78.0–100.0)
MPV: 11.1 fL (ref 8.6–12.4)
Platelets: 266 10*3/uL (ref 150–400)
RBC: 4.49 MIL/uL (ref 3.87–5.11)
RDW: 15.9 % — ABNORMAL HIGH (ref 11.5–15.5)
WBC: 7.5 10*3/uL (ref 4.0–10.5)

## 2015-04-13 LAB — PROTIME-INR
INR: 3.1 — ABNORMAL HIGH (ref 0.8–1.2)
INR: 2.71 — ABNORMAL HIGH (ref <1.50)
Prothrombin Time: 31.2 s — ABNORMAL HIGH (ref 9.1–12.0)
Prothrombin Time: 29.2 seconds — ABNORMAL HIGH (ref 11.6–15.2)

## 2015-04-13 LAB — APTT: aPTT: 37 seconds (ref 24–37)

## 2015-04-13 MED ORDER — DIAZEPAM 2 MG PO TABS: 10.0000 mg | ORAL_TABLET | ORAL | Status: AC

## 2015-04-13 MED ORDER — ISOSORBIDE MONONITRATE ER 30 MG PO TB24: 30.0000 mg | ORAL_TABLET | Freq: Every day | ORAL | Status: DC

## 2015-04-13 NOTE — Progress Notes (Signed)
Remote pacemaker transmission.   

## 2015-04-13 NOTE — Patient Instructions (Addendum)
We will be checking the following labs today - BMET, BNP, CBC, PT, PTT   Medication Instructions:    Continue with your current medicines. BUT  I am adding Imdur 30 mg to take one a day - THIS IS AT YOUR DRUG STORE    Testing/Procedures To Be Arranged:  Cardiac catheterization    Other Special Instructions:  Your provider has recommended a cardiac catherization  You are scheduled for a cardiac catheterization on Tuesday, February 28th at 7:30 AM with Dr. [name] or associate.  Go to [facility] 2nd Floor Short Stay on Tuesday, February 28th at 5:30 AM.  Enter thru the [facility] entrance A No food or drink after midnight on Monday. You may take your medications with a sip of water on the day of your procedure. EXCEPT NO COUMADIN ON Friday, Saturday, Sunday OR Monday   Coronary Angiogram A coronary angiogram, also called coronary angiography, is an X-ray procedure used to look at the arteries in the heart. In this procedure, a dye (contrast dye) is injected through a long, hollow tube (catheter). The catheter is about the size of a piece of cooked spaghetti and is inserted through your groin, wrist, or arm. The dye is injected into each artery, and X-rays are then taken to show if there is a blockage in the arteries of your heart.  LET [facility] CARE PROVIDER KNOW ABOUT: Any allergies you have, including allergies to shellfish or contrast dye.  All medicines you are taking, including vitamins, herbs, eye drops, creams, and over-the-counter medicines.  Previous problems you or members of your family have had with the use of anesthetics.  Any blood disorders you have.  Previous surgeries you have had. History of kidney problems or failure.  Other medical conditions you have.  RISKS AND COMPLICATIONS  Generally, a coronary angiogram is a safe procedure. However, about 1 person out of 1000 can have problems that may include: Allergic reaction to the  dye. Bleeding/bruising from the access site or other locations. Kidney injury, especially in people with impaired kidney function. Stroke (rare). Heart attack (rare). Irregular rhythms (rare) Death (rare)  BEFORE THE PROCEDURE  Do not eat or drink anything after midnight the night before the procedure or as directed by your health care provider.  Ask your health care provider about changing or stopping your regular medicines. This is especially important if you are taking diabetes medicines or blood thinners.  PROCEDURE You may be given a medicine to help you relax (sedative) before the procedure. This medicine is given through an intravenous (IV) access tube that is inserted into one of your veins.  The area where the catheter will be inserted will be washed and shaved. This is usually done in the groin but may be done in the fold of your arm (near your elbow) or in the wrist.  A medicine will be given to numb the area where the catheter will be inserted (local anesthetic).  The health care provider will insert the catheter into an artery. The catheter will be guided by using a special type of X-ray (fluoroscopy) of the blood vessel being examined.  A special dye will then be injected into the catheter, and X-rays will be taken. The dye will help to show where any narrowing or blockages are located in the heart arteries.    AFTER THE PROCEDURE  If the procedure is done through the leg, you will be kept in bed lying flat for several hours. You will be  instructed to not bend or cross your legs. The insertion site will be checked frequently.  The pulse in your feet or wrist will be checked frequently.  Additional blood tests, X-rays, and an electrocardiogram may be done.       If you need a refill on your cardiac medications before your next appointment, please call your pharmacy.   Call the [place] office at [phone] if you have any  questions, problems or concerns.

## 2015-04-13 NOTE — Progress Notes (Addendum)
CARDIOLOGY OFFICE NOTE  Date:  04/13/2015    [name] Date of Birth: 03-25-1950 Medical Record [id number]  PCP:  [name] Messick, MD  Cardiologist:  [name]    Chief Complaint  Patient presents with  . Shortness of Breath    Work in visit - seen for Dr. Gwenlyn [name]    History of Present Illness: [name] is a 65 y.o. female who presents today for a work in visit for shortness of breath. Seen for Dr. Gwenlyn [name].   She has multiple medical issues which include prior St Jude AVR in 2006. She had normal coronaries then and had a low risk Myoview in 2011. An echo done in Oct 2016showed an EF of 99991111 with diastolic dysfunction. Velocities across the prosthetic valve showed no signs of obstruction.  She had a pacemaker placed in Jan 2015 for CHB. Other issues include PVD with a 60% Rt RA narrowing and a non functioning Lt kidney. Dopplers in April 2016 show the Rt RA to be patent. She has an occluded LICA, s/p LCE in [id number], with a patent RICA.   Last seen by [name] Ransom, PA back in December following admission for acute heart failure after being at [facility]. She was felt to be doing well at that visit.   Comes in today. Here alone. Several issues. She is having more "chest pressure" as well as more shortness of breath. She describes her chest pressure as "someone sitting on my chest". This comes and goes. No real pattern. Last night she felt like she had radiation up the left shoulder. She has been trying to walk - but gets more short of breath. She is worried that something is wrong and feels like something is wrong. She is to leave for [place] for a month to take care of her mother. Her weight is down. No real swelling. She has done better with salt restriction. She does not have this discomfort at the time of this visit.    Past Medical History  Diagnosis Date  . Asthma   . Lupus (systemic lupus erythematosus) ([place])   . PVD (peripheral vascular disease) (HCC)     60% R  renal artery stenosis; non-functioning L kidney  . Hypertension   . Dyslipidemia   . Chronic anticoagulation   . History of echocardiogram 11/08/2011    EF >55%; mild-mod concentric LVH; trace aortic regurg.; LA mild-mod dilated  . History of nuclear stress test 01/20/2010    normal pattern of perfusion; low risk scan  . H/O Doppler ultrasound 10/03/2010    carotid duplex - R ICA normal patency; L CCA-ICA bypass gradt patent common to interal cartoid bypass graft   . H/O mechanical aortic valve replacement   . Complete heart block ([place]) 1//2015    Implantation of a dual chamber pacemaker on 03-06-2013 by Dr [name].  The patient received a STJ model number [id number] pacemaker with model number 2088 right ventricular lead.   . Carotid artery disease [facility])     Past Surgical History  Procedure Laterality Date  . Aortic valve replacement  11/29/006    [name]; St. Jude; on coumadin  . Exploratory laparotomy  04/12/2012    small bowel perf; ileostomy & R hemicolectomy   . Tubal ligation  11/1982  . Cholecystectomy  1999  . Cardiac catheterization  11/01/2004    define anatomy   . Carotid endarterectomy      lt.  . Pacemaker insertion  03/06/2013    SJM  Assurity DR PPM implanted by Dr [name] for complete heart block  . Permanent pacemaker insertion N/A 03/06/2013    Procedure: PERMANENT PACEMAKER INSERTION;  Surgeon: [name] Mark, MD;  Location: [place] CATH LAB;  Service: Cardiovascular;  Laterality: N/A;  . Temporary pacemaker insertion N/A 03/06/2013    Procedure: TEMPORARY PACEMAKER INSERTION;  Surgeon: [name] Mark, MD;  Location: [place] CATH LAB;  Service: Cardiovascular;  Laterality: N/A;     Medications: Current Outpatient Prescriptions  Medication Sig Dispense Refill  . ADVAIR DISKUS 250-50 MCG/DOSE AEPB Use 1 inhalation by mouth  two times daily 180 each 1  . allopurinol (ZYLOPRIM) 100 MG tablet Take 1 tablet by mouth daily.  3  . amoxicillin (AMOXIL) 500 MG capsule TAKE 4 CS PO 1 HOUR  PRIOR TO APPOINTMENT  2  . Calcium Carbonate (CALTRATE 600) 1500 MG TABS Take 1 tablet by mouth 2 (two) times daily.      [name] CALCIUM-VITAMIN D PO Take by mouth.    . chlorhexidine (PERIDEX) 0.12 % solution RINSE WITH 1/2 OUNCE BID  5  . cholecalciferol (VITAMIN D) 1000 UNITS tablet Take 1,000 Units by mouth 2 (two) times daily.    . furosemide (LASIX) 40 MG tablet Take 40 mg by mouth daily.    . hydroxychloroquine (PLAQUENIL) 200 MG tablet Take 1 tablet (200 mg total) by mouth 2 (two) times daily. 60 tablet 11  . hydroxypropyl methylcellulose / hypromellose (ISOPTO TEARS / GONIOVISC) 2.5 % ophthalmic solution Place 1 drop into both eyes daily as needed for dry eyes.    [name] loratadine (CLARITIN) 10 MG tablet Take 10 mg by mouth daily.    . metoprolol succinate (TOPROL XL) 25 MG 24 hr tablet Take 1 tablet (25 mg total) by mouth daily. 90 tablet 3  . predniSONE (DELTASONE) 5 MG tablet Take 5 mg by mouth daily.      . Probiotic Product (PROBIOTIC DAILY PO) Take 1 tablet by mouth daily.    . simvastatin (ZOCOR) 20 MG tablet Take 1 tablet by mouth at  bedtime 90 tablet 1  . warfarin (COUMADIN) 5 MG tablet Take 5-7.5 mg by mouth daily. Take 5mg  on Mon, wed, fri and saturdays. The other days take a tablet and a half    . isosorbide mononitrate (IMDUR) 30 MG 24 hr tablet Take 1 tablet (30 mg total) by mouth daily. 30 tablet 3   No current facility-administered medications for this visit.    Allergies: No Known Allergies  Social History: The patient  reports that she has never smoked. She has never used smokeless tobacco. She reports that she does not drink alcohol or use illicit drugs.   Family History: The patient's family history includes Alzheimer's disease in her father; Cancer in her father; Cirrhosis in her father.   Review of Systems: Please see the history of present illness.   Otherwise, the review of systems is positive for none.   All other systems are reviewed and negative.   Physical  Exam: VS:  BP 110/78 mmHg  Pulse 71  Resp 22  Ht 5\' 5"  (1.651 m)  Wt 265 lb (120.203 kg)  BMI 44.10 kg/m2  SpO2 99% .  BMI Body mass index is 44.1 kg/(m^2).  Wt Readings from Last 3 Encounters:  04/13/15 265 lb (120.203 kg)  02/10/15 268 lb 1 oz (121.592 kg)  01/30/15 264 lb 6.4 oz (119.931 kg)    General: Pleasant. Morbidly obese. She is alert and in no acute distress. Her  weight is down a few pounds.  HEENT: Normal. Neck: Supple, no JVD, carotid bruits, or masses noted.  Cardiac: Regular rate and rhythm. Soft outflow murmur noted. No significant edema.  Respiratory:  Lungs are clear to auscultation bilaterally with normal work of breathing.  GI: Obese with large pannus.   MS: No deformity or atrophy. Gait and ROM intact. Skin: Warm and dry. Color is pale.  Neuro:  Strength and sensation are intact and no gross focal deficits noted.  Psych: Alert, appropriate and with normal affect.   LABORATORY DATA:  EKG:  EKG is ordered today. This demonstrates a paced rhythm.      Her pacemaker transmitted earlier today - this shows only atrial tach, no AF.  She had a 28 beat run of NSVT on Feb 4 (placed on Toprol).  Lab Results  Component Value Date   WBC 5.6 01/30/2015   HGB 10.4* 01/30/2015   HCT 32.9* 01/30/2015   PLT 198 01/30/2015   GLUCOSE 96 02/10/2015   CHOL 157 06/02/2014   TRIG 78 06/02/2014   HDL 101 06/02/2014   LDLCALC 40 06/02/2014   ALT 44 01/27/2015   AST 43* 01/27/2015   NA 141 02/10/2015   K 4.4 02/10/2015   CL 103 02/10/2015   CREATININE 1.20* 02/10/2015   BUN 28* 02/10/2015   CO2 27 02/10/2015   TSH 2.666 03/06/2013   INR 2.6* 03/09/2015   HGBA1C 6.0* 01/27/2015   Lab Results  Component Value Date   INR 2.6* 03/09/2015   INR 2.69* 01/30/2015   INR 2.57* 01/29/2015     BNP (last 3 results)  Recent Labs  01/27/15 1713  BNP 661.0*    ProBNP (last 3 results) No results for input(s): PROBNP in the last 8760 hours.   Other Studies Reviewed  Today:  Echo Study Conclusions from 11/2014  - Left ventricle: Wall thickness was increased in a pattern of mild LVH. Systolic function was normal. The estimated ejection fraction was in the range of 50% to 55%. Incoordinate septal motion, apical hypokinesis. Diastolic dysfunction with indeterminate LV Filling pressures. - Aortic valve: St. Jude mechanical AVR - normal leaflet motion. No obstruction, trivial AI cleaning jet. Valve area (VTI): 1.08 cm^2. Valve area (Vmax): 1 cm^2. Valve area (Vmean): 1.01 cm^2. - Mitral valve: Calcified annulus. Mildly thickened leaflets . There was mild posteriorly directed regurgitation. - Left atrium: Moderately dilated at 46 ml/m2. - Right ventricle: The cavity size was moderately dilated. Pacer wire or catheter noted in right ventricle. - Right atrium: Severely dilated at 30 cm2. Pacer wire or catheter noted in right atrium. - Tricuspid valve: There was moderate regurgitation. - Pulmonary arteries: The main pulmonary artery is dilated. PA peak pressure: 44 mm Hg (S). - Inferior vena cava: The IVC measures <2.1 cm, but does not collapse >50%, suggesting an elevated RA pressure of 8 mmHg.  Impressions:  - Compared to the prior study in 2015, pacer or AICD wires noted. LVEF is improved to 50-55%, the mechanical AVR shows no signs of obstruction, moderate TR, RVSP 44 mmHg.  Assessment/Plan: 1. Chest pressure - sounds worrisome for angina. She has known vascular disease. Her last cath was 11 years ago. Body habitus does not appear good for nuclear imaging. I have recommended cardiac catheterization. Adding Imdur 30 mg a day. Lab today. Posted for next Tuesday. INR here in our office is 3.1 - will hold her coumadin Friday, Saturday, Sunday and Monday. No history of stroke or AF noted. The patient  understands that risks include but are not limited to stroke (1 in 1000), death (1 in 71), kidney failure [usually temporary] (1 in  500), bleeding (1 in 200), allergic reaction [possibly serious] (1 in 200), and agrees to proceed. She was advised at this visit to NOT proceed on with her trip to [place].   2. Prior AVR - last echo from October noted.   3. Chronic diastolic HF - her weight is actually down. I do not get the sense that this is the issue at this time.  4. Obesity  5. Lupus  6. Chronic coumadin therapy - plan as above.   7. Underlying PPM - with remote check from today reviewed.   8. PVD with prior RAS/nonfunctioning left kidney   9. Anemia - not able to give me a clear reason as to why. Recheck her labs - if this is worse, this may be the culprit. Further disposition to follow.   Current medicines are reviewed with the patient today.  The patient does not have concerns regarding medicines other than what has been noted above.  The following changes have been made:  See above.  Labs/ tests ordered today include:    Orders Placed This Encounter  Procedures  . Brain natriuretic peptide  . Basic metabolic panel  . CBC  . Hepatic function panel  . Protime-INR  . APTT     Disposition:   Further disposition pending.   Patient is agreeable to this plan and will call if any problems develop in the interim.   Signed: Burtis Junes, RN, ANP-C 04/13/2015 12:27 PM  [facility] [address] [place], [place]  [address] Phone: [phone] Fax: [phone]

## 2015-04-13 NOTE — Addendum Note (Signed)
Addended by: Elberta Leatherwood R on: 04/13/2015 05:06 PM   Modules accepted: Orders

## 2015-04-14 ENCOUNTER — Other Ambulatory Visit: Payer: Self-pay | Admitting: *Deleted

## 2015-04-14 ENCOUNTER — Other Ambulatory Visit: Payer: Self-pay | Admitting: Nurse Practitioner

## 2015-04-14 DIAGNOSIS — N289 Disorder of kidney and ureter, unspecified: Secondary | ICD-10-CM

## 2015-04-14 DIAGNOSIS — Z7901 Long term (current) use of anticoagulants: Secondary | ICD-10-CM

## 2015-04-14 LAB — BRAIN NATRIURETIC PEPTIDE: Brain Natriuretic Peptide: 202.9 pg/mL — ABNORMAL HIGH (ref <100)

## 2015-04-18 ENCOUNTER — Other Ambulatory Visit (INDEPENDENT_AMBULATORY_CARE_PROVIDER_SITE_OTHER): Payer: Medicare Other | Admitting: *Deleted

## 2015-04-18 DIAGNOSIS — Z5181 Encounter for therapeutic drug level monitoring: Secondary | ICD-10-CM | POA: Diagnosis not present

## 2015-04-18 DIAGNOSIS — Z7901 Long term (current) use of anticoagulants: Secondary | ICD-10-CM

## 2015-04-18 DIAGNOSIS — N289 Disorder of kidney and ureter, unspecified: Secondary | ICD-10-CM

## 2015-04-18 LAB — BASIC METABOLIC PANEL
BUN: 22 mg/dL (ref 7–25)
CO2: 26 mmol/L (ref 20–31)
Calcium: 9.1 mg/dL (ref 8.6–10.4)
Chloride: 106 mmol/L (ref 98–110)
Creat: 1.21 mg/dL — ABNORMAL HIGH (ref 0.50–0.99)
Glucose, Bld: 78 mg/dL (ref 65–99)
Potassium: 4.2 mmol/L (ref 3.5–5.3)
Sodium: 140 mmol/L (ref 135–146)

## 2015-04-18 NOTE — Addendum Note (Signed)
Addended by: Freada Bergeron on: 04/18/2015 04:20 PM   Modules accepted: Orders

## 2015-04-19 ENCOUNTER — Encounter (HOSPITAL_COMMUNITY): Payer: Self-pay | Admitting: Cardiology

## 2015-04-19 ENCOUNTER — Encounter (HOSPITAL_COMMUNITY)
Admission: RE | Disposition: A | Discharge status: Home / Self Care | Payer: Self-pay | Source: Ambulatory Visit | Attending: Cardiology

## 2015-04-19 ENCOUNTER — Ambulatory Visit (HOSPITAL_COMMUNITY)
Admission: RE | Admit: 2015-04-19 | Discharge: 2015-04-19 | Disposition: A | Discharge status: Home / Self Care | Payer: Medicare Other | Source: Ambulatory Visit | Attending: Cardiology | Admitting: Cardiology

## 2015-04-19 ENCOUNTER — Telehealth: Payer: Self-pay | Admitting: Internal Medicine

## 2015-04-19 DIAGNOSIS — Z95810 Presence of automatic (implantable) cardiac defibrillator: Secondary | ICD-10-CM | POA: Insufficient documentation

## 2015-04-19 DIAGNOSIS — Z7951 Long term (current) use of inhaled steroids: Secondary | ICD-10-CM | POA: Diagnosis not present

## 2015-04-19 DIAGNOSIS — Z7901 Long term (current) use of anticoagulants: Secondary | ICD-10-CM | POA: Insufficient documentation

## 2015-04-19 DIAGNOSIS — I5032 Chronic diastolic (congestive) heart failure: Secondary | ICD-10-CM | POA: Insufficient documentation

## 2015-04-19 DIAGNOSIS — Z952 Presence of prosthetic heart valve: Secondary | ICD-10-CM | POA: Diagnosis not present

## 2015-04-19 DIAGNOSIS — E785 Hyperlipidemia, unspecified: Secondary | ICD-10-CM | POA: Insufficient documentation

## 2015-04-19 DIAGNOSIS — R079 Chest pain, unspecified: Secondary | ICD-10-CM | POA: Diagnosis present

## 2015-04-19 DIAGNOSIS — I739 Peripheral vascular disease, unspecified: Secondary | ICD-10-CM | POA: Insufficient documentation

## 2015-04-19 DIAGNOSIS — I442 Atrioventricular block, complete: Secondary | ICD-10-CM | POA: Diagnosis not present

## 2015-04-19 DIAGNOSIS — I10 Essential (primary) hypertension: Secondary | ICD-10-CM | POA: Diagnosis present

## 2015-04-19 DIAGNOSIS — J45909 Unspecified asthma, uncomplicated: Secondary | ICD-10-CM | POA: Diagnosis not present

## 2015-04-19 DIAGNOSIS — M329 Systemic lupus erythematosus, unspecified: Secondary | ICD-10-CM | POA: Insufficient documentation

## 2015-04-19 DIAGNOSIS — I701 Atherosclerosis of renal artery: Secondary | ICD-10-CM | POA: Diagnosis not present

## 2015-04-19 DIAGNOSIS — I110 Hypertensive heart disease with heart failure: Secondary | ICD-10-CM | POA: Insufficient documentation

## 2015-04-19 DIAGNOSIS — R0789 Other chest pain: Secondary | ICD-10-CM | POA: Diagnosis present

## 2015-04-19 DIAGNOSIS — D649 Anemia, unspecified: Secondary | ICD-10-CM | POA: Diagnosis not present

## 2015-04-19 DIAGNOSIS — Z6841 Body Mass Index (BMI) 40.0 and over, adult: Secondary | ICD-10-CM | POA: Insufficient documentation

## 2015-04-19 HISTORY — PX: CARDIAC CATHETERIZATION: SHX172

## 2015-04-19 LAB — PROTIME-INR
INR: 1.4 (ref 0.00–1.49)
Prothrombin Time: 17.3 seconds — ABNORMAL HIGH (ref 11.6–15.2)

## 2015-04-19 SURGERY — CORONARY/GRAFT ANGIOGRAPHY

## 2015-04-19 MED ORDER — SODIUM CHLORIDE 0.9% FLUSH: 3.0000 mL | Freq: Two times a day (BID) | INTRAVENOUS | Status: DC

## 2015-04-19 MED ORDER — FENTANYL CITRATE (PF) 100 MCG/2ML IJ SOLN
INTRAMUSCULAR | Status: DC | PRN
  Administered 2015-04-19: 25 ug via INTRAVENOUS

## 2015-04-19 MED ORDER — MIDAZOLAM HCL 2 MG/2ML IJ SOLN
INTRAMUSCULAR | Status: DC | PRN
  Administered 2015-04-19: 1 mg via INTRAVENOUS

## 2015-04-19 MED ORDER — IOHEXOL 350 MG/ML SOLN
INTRAVENOUS | Status: DC | PRN
  Administered 2015-04-19: 55 mL via INTRAVENOUS

## 2015-04-19 MED ORDER — HEPARIN (PORCINE) IN NACL 2-0.9 UNIT/ML-% IJ SOLN
INTRAMUSCULAR | Status: AC
  Filled 2015-04-19: qty 1500

## 2015-04-19 MED ORDER — LIDOCAINE HCL (PF) 1 % IJ SOLN
INTRAMUSCULAR | Status: DC | PRN
  Administered 2015-04-19: 2 mL via INTRADERMAL

## 2015-04-19 MED ORDER — HEPARIN (PORCINE) IN NACL 2-0.9 UNIT/ML-% IJ SOLN
INTRAMUSCULAR | Status: DC | PRN
  Administered 2015-04-19: 09:00:00

## 2015-04-19 MED ORDER — SODIUM CHLORIDE 0.9 % WEIGHT BASED INFUSION: 1.0000 mL/kg/h | INTRAVENOUS | Status: DC

## 2015-04-19 MED ORDER — LIDOCAINE HCL (PF) 1 % IJ SOLN
INTRAMUSCULAR | Status: AC
  Filled 2015-04-19: qty 30

## 2015-04-19 MED ORDER — HEPARIN SODIUM (PORCINE) 1000 UNIT/ML IJ SOLN
INTRAMUSCULAR | Status: AC
  Filled 2015-04-19: qty 1

## 2015-04-19 MED ORDER — VERAPAMIL HCL 2.5 MG/ML IV SOLN
INTRAVENOUS | Status: DC | PRN
  Administered 2015-04-19: 09:00:00 via INTRA_ARTERIAL

## 2015-04-19 MED ORDER — SODIUM CHLORIDE 0.9 % IV SOLN: 250.0000 mL | INTRAVENOUS | Status: DC | PRN

## 2015-04-19 MED ORDER — VERAPAMIL HCL 2.5 MG/ML IV SOLN
INTRAVENOUS | Status: AC
  Filled 2015-04-19: qty 2

## 2015-04-19 MED ORDER — MIDAZOLAM HCL 2 MG/2ML IJ SOLN
INTRAMUSCULAR | Status: AC
  Filled 2015-04-19: qty 2

## 2015-04-19 MED ORDER — SODIUM CHLORIDE 0.9 % IV SOLN
INTRAVENOUS | Status: DC
  Administered 2015-04-19: 07:00:00 via INTRAVENOUS

## 2015-04-19 MED ORDER — FENTANYL CITRATE (PF) 100 MCG/2ML IJ SOLN
INTRAMUSCULAR | Status: AC
  Filled 2015-04-19: qty 2

## 2015-04-19 MED ORDER — SODIUM CHLORIDE 0.9% FLUSH: 3.0000 mL | INTRAVENOUS | Status: DC | PRN

## 2015-04-19 MED ORDER — HEPARIN SODIUM (PORCINE) 1000 UNIT/ML IJ SOLN
INTRAMUSCULAR | Status: DC | PRN
  Administered 2015-04-19: 5000 [IU] via INTRAVENOUS

## 2015-04-19 SURGICAL SUPPLY — 9 items
CATH INFINITI 5 FR JL3.5 (CATHETERS) ×2 IMPLANT
CATH INFINITI JR4 5F (CATHETERS) ×2 IMPLANT
DEVICE RAD COMP TR BAND LRG (VASCULAR PRODUCTS) ×2 IMPLANT
GLIDESHEATH SLEND SS 6F .021 (SHEATH) ×2 IMPLANT
KIT HEART LEFT (KITS) ×2 IMPLANT
PACK CARDIAC CATHETERIZATION (CUSTOM PROCEDURE TRAY) ×2 IMPLANT
TRANSDUCER W/STOPCOCK (MISCELLANEOUS) ×2 IMPLANT
TUBING CIL FLEX 10 FLL-RA (TUBING) ×2 IMPLANT
WIRE SAFE-T 1.5MM-J .035X260CM (WIRE) ×2 IMPLANT

## 2015-04-19 NOTE — Discharge Instructions (Addendum)
You may resume Coumadin this evening.    Radial Site Care Refer to this sheet in the next few weeks. These instructions provide you with information about caring for yourself after your procedure. Your health care provider may also give you more specific instructions. Your treatment has been planned according to current medical practices, but problems sometimes occur. Call your health care provider if you have any problems or questions after your procedure. WHAT TO EXPECT AFTER THE PROCEDURE After your procedure, it is typical to have the following:  Bruising at the radial site that usually fades within 1-2 weeks.  Blood collecting in the tissue (hematoma) that may be painful to the touch. It should usually decrease in size and tenderness within 1-2 weeks. HOME CARE INSTRUCTIONS  Take medicines only as directed by your health care provider.  You may shower 24-48 hours after the procedure or as directed by your health care provider. Remove the bandage (dressing) and gently wash the site with plain soap and water. Pat the area dry with a clean towel. Do not rub the site, because this may cause bleeding.  Do not take baths, swim, or use a hot tub until your health care provider approves.  Check your insertion site every day for redness, swelling, or drainage.  Do not apply powder or lotion to the site.  Do not flex or bend the affected arm for 24 hours or as directed by your health care provider.  Do not push or pull heavy objects with the affected arm for 24 hours or as directed by your health care provider.  Do not lift over 10 lb (4.5 kg) for 5 days after your procedure or as directed by your health care provider.  Ask your health care provider when it is okay to:  Return to work or school.  Resume usual physical activities or sports.  Resume sexual activity.  Do not drive home if you are discharged the same day as the procedure. Have someone else drive you.  You may drive 24  hours after the procedure unless otherwise instructed by your health care provider.  Do not operate machinery or power tools for 24 hours after the procedure.  If your procedure was done as an outpatient procedure, which means that you went home the same day as your procedure, a responsible adult should be with you for the first 24 hours after you arrive home.  Keep all follow-up visits as directed by your health care provider. This is important. SEEK MEDICAL CARE IF:  You have a fever.  You have chills.  You have increased bleeding from the radial site. Hold pressure on the site. SEEK IMMEDIATE MEDICAL CARE IF:  You have unusual pain at the radial site.  You have redness, warmth, or swelling at the radial site.  You have drainage (other than a small amount of blood on the dressing) from the radial site.  The radial site is bleeding, and the bleeding does not stop after 30 minutes of holding steady pressure on the site.  Your arm or hand becomes pale, cool, tingly, or numb.   This information is not intended to replace advice given to you by your health care provider. Make sure you discuss any questions you have with your health care provider.   Document Released: 03/10/2010 Document Revised: 02/26/2014 Document Reviewed: 08/24/2013 Elsevier Interactive Patient Education [employer].

## 2015-04-19 NOTE — Telephone Encounter (Signed)
Last ov 12.28.15 w/ CY w/ recs to follow up in 1 year: Patient Instructions       Increase lasix to 80 mg daily x 3 days, then drop back to 40 mg daily  Increase prednisone to 10 mg daily, then drop back to 5 mg daily  Please call as needed   Called spoke with patient who c/o pressure in her chest, increased SOB w/ exertion x1 week.  Had a heart cath this morning and was informed that this was normal.  She is adamant about an appt this week to be seen.  CY with no openings but pt is okay with seeing another provider.  MW with first available on 3.2.17 @ 1015.  Pt is okay with this date/time.  Nothing further needed; will sign off.

## 2015-04-19 NOTE — H&P (View-Only) (Signed)
CARDIOLOGY OFFICE NOTE  Date:  04/13/2015    [name] Date of Birth: 1950-12-20 Medical Record [id number]  PCP:  [name], MD  Cardiologist:  Gwenlyn Found    Chief Complaint  Patient presents with  . Shortness of Breath    Work in visit - seen for Dr. Gwenlyn Found    History of Present Illness: [name] is a 65 y.o. female who presents today for a work in visit for shortness of breath. Seen for Dr. Gwenlyn Found.   She has multiple medical issues which include prior St Jude AVR in 2006. She had normal coronaries then and had a low risk Myoview in 2011. An echo done in Oct 2016showed an EF of 99991111 with diastolic dysfunction. Velocities across the prosthetic valve showed no signs of obstruction.  She had a pacemaker placed in Jan 2015 for CHB. Other issues include PVD with a 60% Rt RA narrowing and a non functioning Lt kidney. Dopplers in April 2016 show the Rt RA to be patent. She has an occluded LICA, s/p LCE in [id number], with a patent RICA.   Last seen by Kerin Ransom, PA back in December following admission for acute heart failure after being at [facility]. She was felt to be doing well at that visit.   Comes in today. Here alone. Several issues. She is having more "chest pressure" as well as more shortness of breath. She describes her chest pressure as "someone sitting on my chest". This comes and goes. No real pattern. Last night she felt like she had radiation up the left shoulder. She has been trying to walk - but gets more short of breath. She is worried that something is wrong and feels like something is wrong. She is to leave for [place] for a month to take care of her mother. Her weight is down. No real swelling. She has done better with salt restriction. She does not have this discomfort at the time of this visit.    Past Medical History  Diagnosis Date  . Asthma   . Lupus (systemic lupus erythematosus) ([place])   . PVD (peripheral vascular disease) ([facility])     60% R  renal artery stenosis; non-functioning L kidney  . Hypertension   . Dyslipidemia   . Chronic anticoagulation   . History of echocardiogram 11/08/2011    EF >55%; mild-mod concentric LVH; trace aortic regurg.; LA mild-mod dilated  . History of nuclear stress test 01/20/2010    normal pattern of perfusion; low risk scan  . H/O Doppler ultrasound 10/03/2010    carotid duplex - R ICA normal patency; L CCA-ICA bypass gradt patent common to interal cartoid bypass graft   . H/O mechanical aortic valve replacement   . Complete heart block ([place]) 1//2015    Implantation of a dual chamber pacemaker on 03-06-2013 by Dr Rayann Heman.  The patient received a STJ model number [id number] pacemaker with model number [id number] right ventricular lead.   . Carotid artery disease [facility])     Past Surgical History  Procedure Laterality Date  . Aortic valve replacement  11/29/006    Kamie Korber; St. Jude; on coumadin  . Exploratory laparotomy  04/12/2012    small bowel perf; ileostomy & R hemicolectomy   . Tubal ligation  11/1982  . Cholecystectomy  1999  . Cardiac catheterization  11/01/2004    define anatomy   . Carotid endarterectomy      lt.  . Pacemaker insertion  03/06/2013    [facility]  Assurity DR PPM implanted by Dr Rayann Heman for complete heart block  . Permanent pacemaker insertion N/A 03/06/2013    Procedure: PERMANENT PACEMAKER INSERTION;  Surgeon: Coralyn Mark, MD;  Location: [place] CATH LAB;  Service: Cardiovascular;  Laterality: N/A;  . Temporary pacemaker insertion N/A 03/06/2013    Procedure: TEMPORARY PACEMAKER INSERTION;  Surgeon: Coralyn Mark, MD;  Location: [place] CATH LAB;  Service: Cardiovascular;  Laterality: N/A;     Medications: Current Outpatient Prescriptions  Medication Sig Dispense Refill  . ADVAIR DISKUS 250-50 MCG/DOSE AEPB Use 1 inhalation by mouth  two times daily 180 each 1  . allopurinol (ZYLOPRIM) 100 MG tablet Take 1 tablet by mouth daily.  3  . amoxicillin (AMOXIL) 500 MG capsule TAKE 4 CS PO 1 HOUR  PRIOR TO APPOINTMENT  2  . Calcium Carbonate (CALTRATE 600) 1500 MG TABS Take 1 tablet by mouth 2 (two) times daily.      [name] CALCIUM-VITAMIN D PO Take by mouth.    . chlorhexidine (PERIDEX) 0.12 % solution RINSE WITH 1/2 OUNCE BID  5  . cholecalciferol (VITAMIN D) 1000 UNITS tablet Take 1,000 Units by mouth 2 (two) times daily.    . furosemide (LASIX) 40 MG tablet Take 40 mg by mouth daily.    . hydroxychloroquine (PLAQUENIL) 200 MG tablet Take 1 tablet (200 mg total) by mouth 2 (two) times daily. 60 tablet 11  . hydroxypropyl methylcellulose / hypromellose (ISOPTO TEARS / GONIOVISC) 2.5 % ophthalmic solution Place 1 drop into both eyes daily as needed for dry eyes.    [name] loratadine (CLARITIN) 10 MG tablet Take 10 mg by mouth daily.    . metoprolol succinate (TOPROL XL) 25 MG 24 hr tablet Take 1 tablet (25 mg total) by mouth daily. 90 tablet 3  . predniSONE (DELTASONE) 5 MG tablet Take 5 mg by mouth daily.      . Probiotic Product (PROBIOTIC DAILY PO) Take 1 tablet by mouth daily.    . simvastatin (ZOCOR) 20 MG tablet Take 1 tablet by mouth at  bedtime 90 tablet 1  . warfarin (COUMADIN) 5 MG tablet Take 5-7.5 mg by mouth daily. Take 5mg  on Mon, wed, fri and saturdays. The other days take a tablet and a half    . isosorbide mononitrate (IMDUR) 30 MG 24 hr tablet Take 1 tablet (30 mg total) by mouth daily. 30 tablet 3   No current facility-administered medications for this visit.    Allergies: No Known Allergies  Social History: The patient  reports that she has never smoked. She has never used smokeless tobacco. She reports that she does not drink alcohol or use illicit drugs.   Family History: The patient's family history includes Alzheimer's disease in her father; Cancer in her father; Cirrhosis in her father.   Review of Systems: Please see the history of present illness.   Otherwise, the review of systems is positive for none.   All other systems are reviewed and negative.   Physical  Exam: VS:  BP 110/78 mmHg  Pulse 71  Resp 22  Ht 5\' 5"  (1.651 m)  Wt 265 lb (120.203 kg)  BMI 44.10 kg/m2  SpO2 99% .  BMI Body mass index is 44.1 kg/(m^2).  Wt Readings from Last 3 Encounters:  04/13/15 265 lb (120.203 kg)  02/10/15 268 lb 1 oz (121.592 kg)  01/30/15 264 lb 6.4 oz (119.931 kg)    General: Pleasant. Morbidly obese. She is alert and in no acute distress. Her  weight is down a few pounds.  HEENT: Normal. Neck: Supple, no JVD, carotid bruits, or masses noted.  Cardiac: Regular rate and rhythm. Soft outflow murmur noted. No significant edema.  Respiratory:  Lungs are clear to auscultation bilaterally with normal work of breathing.  GI: Obese with large pannus.   MS: No deformity or atrophy. Gait and ROM intact. Skin: Warm and dry. Color is pale.  Neuro:  Strength and sensation are intact and no gross focal deficits noted.  Psych: Alert, appropriate and with normal affect.   LABORATORY DATA:  EKG:  EKG is ordered today. This demonstrates a paced rhythm.      Her pacemaker transmitted earlier today - this shows only atrial tach, no AF.  She had a 28 beat run of NSVT on Feb 4 (placed on Toprol).  Lab Results  Component Value Date   WBC 5.6 01/30/2015   HGB 10.4* 01/30/2015   HCT 32.9* 01/30/2015   PLT 198 01/30/2015   GLUCOSE 96 02/10/2015   CHOL 157 06/02/2014   TRIG 78 06/02/2014   HDL 101 06/02/2014   LDLCALC 40 06/02/2014   ALT 44 01/27/2015   AST 43* 01/27/2015   NA 141 02/10/2015   K 4.4 02/10/2015   CL 103 02/10/2015   CREATININE 1.20* 02/10/2015   BUN 28* 02/10/2015   CO2 27 02/10/2015   TSH 2.666 03/06/2013   INR 2.6* 03/09/2015   HGBA1C 6.0* 01/27/2015   Lab Results  Component Value Date   INR 2.6* 03/09/2015   INR 2.69* 01/30/2015   INR 2.57* 01/29/2015     BNP (last 3 results)  Recent Labs  01/27/15 1713  BNP 661.0*    ProBNP (last 3 results) No results for input(s): PROBNP in the last 8760 hours.   Other Studies Reviewed  Today:  Echo Study Conclusions from 11/2014  - Left ventricle: Wall thickness was increased in a pattern of mild LVH. Systolic function was normal. The estimated ejection fraction was in the range of 50% to 55%. Incoordinate septal motion, apical hypokinesis. Diastolic dysfunction with indeterminate LV Filling pressures. - Aortic valve: St. Jude mechanical AVR - normal leaflet motion. No obstruction, trivial AI cleaning jet. Valve area (VTI): 1.08 cm^2. Valve area (Vmax): 1 cm^2. Valve area (Vmean): 1.01 cm^2. - Mitral valve: Calcified annulus. Mildly thickened leaflets . There was mild posteriorly directed regurgitation. - Left atrium: Moderately dilated at 46 ml/m2. - Right ventricle: The cavity size was moderately dilated. Pacer wire or catheter noted in right ventricle. - Right atrium: Severely dilated at 30 cm2. Pacer wire or catheter noted in right atrium. - Tricuspid valve: There was moderate regurgitation. - Pulmonary arteries: The main pulmonary artery is dilated. PA peak pressure: 44 mm Hg (S). - Inferior vena cava: The IVC measures <2.1 cm, but does not collapse >50%, suggesting an elevated RA pressure of 8 mmHg.  Impressions:  - Compared to the prior study in 2015, pacer or AICD wires noted. LVEF is improved to 50-55%, the mechanical AVR shows no signs of obstruction, moderate TR, RVSP 44 mmHg.  Assessment/Plan: 1. Chest pressure - sounds worrisome for angina. She has known vascular disease. Her last cath was 11 years ago. Body habitus does not appear good for nuclear imaging. I have recommended cardiac catheterization. Adding Imdur 30 mg a day. Lab today. Posted for next Tuesday. INR here in our office is 3.1 - will hold her coumadin Friday, Saturday, Sunday and Monday. No history of stroke or AF noted. The patient  understands that risks include but are not limited to stroke (1 in 1000), death (1 in 42), kidney failure [usually temporary] (1 in  500), bleeding (1 in 200), allergic reaction [possibly serious] (1 in 200), and agrees to proceed.   2. Prior AVR - last echo from October noted.   3. Chronic diastolic HF - her weight is actually down. I do not get the sense that this is the issue at this time.  4. Obesity  5. Lupus  6. Chronic coumadin therapy - plan as above.   7. Underlying PPM - with remote check from today reviewed.   8. PVD with prior RAS/nonfunctioning left kidney   9. Anemia - not able to give me a clear reason as to why. Recheck her labs - if this is worse, this may be the culprit. Further disposition to follow.   Current medicines are reviewed with the patient today.  The patient does not have concerns regarding medicines other than what has been noted above.  The following changes have been made:  See above.  Labs/ tests ordered today include:    Orders Placed This Encounter  Procedures  . Brain natriuretic peptide  . Basic metabolic panel  . CBC  . Hepatic function panel  . Protime-INR  . APTT     Disposition:   Further disposition pending.   Patient is agreeable to this plan and will call if any problems develop in the interim.   Signed: Burtis Junes, RN, ANP-C 04/13/2015 12:27 PM  [facility] [address] [place], [place]  [id number] Phone: [phone] Fax: [phone]

## 2015-04-19 NOTE — Interval H&P Note (Signed)
History and Physical Interval Note:  04/19/2015 8:29 AM  [name]  has presented today for surgery, with the diagnosis of cp  The various methods of treatment have been discussed with the patient and family. After consideration of risks, benefits and other options for treatment, the patient has consented to  Procedure(s): Left Heart Cath and Coronary Angiography (N/A) as a surgical intervention .  The patient's history has been reviewed, patient examined, no change in status, stable for surgery.  I have reviewed the patient's chart and labs.  Questions were answered to the patient's satisfaction.   Cath Lab Visit (complete for each Cath Lab visit)  Clinical Evaluation Leading to the Procedure:   ACS: No.  Non-ACS:    Anginal Classification: CCS III  Anti-ischemic medical therapy: Minimal Therapy (1 class of medications)  Non-Invasive Test Results: No non-invasive testing performed  Prior CABG: No previous CABG        Collier Salina [facility] 04/19/2015 8:30 AM

## 2015-04-21 ENCOUNTER — Ambulatory Visit: Payer: Medicare Other | Admitting: Internal Medicine

## 2015-04-22 ENCOUNTER — Ambulatory Visit (INDEPENDENT_AMBULATORY_CARE_PROVIDER_SITE_OTHER)
Admission: RE | Admit: 2015-04-22 | Discharge: 2015-04-22 | Disposition: A | Discharge status: Home / Self Care | Payer: Medicare Other | Source: Ambulatory Visit | Attending: Adult Health | Admitting: Adult Health

## 2015-04-22 ENCOUNTER — Ambulatory Visit (INDEPENDENT_AMBULATORY_CARE_PROVIDER_SITE_OTHER): Payer: Medicare Other | Admitting: Adult Health

## 2015-04-22 ENCOUNTER — Encounter: Payer: Self-pay | Admitting: Adult Health

## 2015-04-22 VITALS — BP 124/80 | HR 72 | Temp 97.5°F | Ht 65.0 in | Wt 268.0 lb

## 2015-04-22 DIAGNOSIS — J4521 Mild intermittent asthma with (acute) exacerbation: Secondary | ICD-10-CM | POA: Diagnosis not present

## 2015-04-22 DIAGNOSIS — R0602 Shortness of breath: Secondary | ICD-10-CM

## 2015-04-22 NOTE — Patient Instructions (Addendum)
Chest xray today  Increase Prednisone 10mg  daily for 1 week  then back to 5mg  daily  Claritin 10mg   As needed  Drainage  Follow up with Dr. Annamaria Boots  In 2-3 months  Please contact office for sooner follow up if symptoms do not improve or worsen or seek emergency care

## 2015-04-22 NOTE — Assessment & Plan Note (Signed)
?  mild flare , no desats with ambulation. More restrictive pattern on spirometry ? Obesity releated  Check xray . Adjust steroids briefly to see if helps.  Recent card eval was neg.   Plan  Chest xray today  Increase Prednisone 10mg  daily for 1 week  then back to 5mg  daily  Claritin 10mg   As needed  Drainage  Follow up with Dr. Annamaria Boots  In 2-3 months  Please contact office for sooner follow up if symptoms do not improve or worsen or seek emergency care

## 2015-04-22 NOTE — Progress Notes (Signed)
Quick Note:  Called spoke with patient, advised of cxr results / recs as stated by TP. Pt verbalized her understanding and denied any questions. ______ 

## 2015-04-22 NOTE — Progress Notes (Signed)
Subjective:    Patient ID: [name], female    DOB: [birth year], 65 y.o.   MRN: [id number]  HPI 65 yo female never smoker with asthma  Has Lupus on plaquenil and pred 5mg  daily  S/p AVR , pacemaker , on coumadin.   04/22/2015 Acute OV  Pt present for an acute office visit.  Complains of worsening SOB and chest tightness x 1 week.  This has been going on for a while but worse for last couple of weeks.  No sign cough . Denies any wheezing, chest congestion, sinus pressure/drainage, fever, nausea or vomiting.  Had heart cath last week , noted stable w/ normal coronary anatomy , nml valve movement  Remains on Advair Twice daily  .  Followed by Cardology for CHF . Leg swelling is much better. On lasix 40mg  daily  Echo 11/2014 with EF 50-55%. PAP  34mmHg.  Admitted in Dec 2016 with CHF flare , improved with diuresis.  Spirometry today reviewecd with pt with FEV1 58%, ratio 83, FVC 55%.  BMI is 44 .  Walk test in office with no drop in O2 sats. .    Past Medical History  Diagnosis Date  . Asthma   . Lupus (systemic lupus erythematosus) ([place])   . PVD (peripheral vascular disease) ([facility])     60% R renal artery stenosis; non-functioning L kidney  . Hypertension   . Dyslipidemia   . Chronic anticoagulation   . History of echocardiogram 11/08/2011    EF >55%; mild-mod concentric LVH; trace aortic regurg.; LA mild-mod dilated  . History of nuclear stress test 01/20/2010    normal pattern of perfusion; low risk scan  . H/O Doppler ultrasound 10/03/2010    carotid duplex - R ICA normal patency; L CCA-ICA bypass gradt patent common to interal cartoid bypass graft   . H/O mechanical aortic valve replacement   . Complete heart block ([place]) 1//2015    Implantation of a dual chamber pacemaker on 03-06-2013 by Dr Rayann Heman.  The patient received a STJ model number [id number] pacemaker with model number [id number] right ventricular lead.   . Carotid artery disease [facility])    Current Outpatient Prescriptions on  File Prior to Visit  Medication Sig Dispense Refill  . ADVAIR DISKUS 250-50 MCG/DOSE AEPB Use 1 inhalation by mouth  two times daily 180 each 1  . allopurinol (ZYLOPRIM) 100 MG tablet Take 1 tablet by mouth daily.  3  . Calcium Carbonate (CALTRATE 600) 1500 MG TABS Take 1 tablet by mouth 2 (two) times daily.      . chlorhexidine (PERIDEX) 0.12 % solution RINSE WITH 1/2 OUNCE DAILY PRN  5  . cholecalciferol (VITAMIN D) 1000 UNITS tablet Take 1,000 Units by mouth 2 (two) times daily.    . furosemide (LASIX) 40 MG tablet Take 40 mg by mouth daily.    . hydroxychloroquine (PLAQUENIL) 200 MG tablet Take 1 tablet (200 mg total) by mouth 2 (two) times daily. 60 tablet 11  . hydroxypropyl methylcellulose / hypromellose (ISOPTO TEARS / GONIOVISC) 2.5 % ophthalmic solution Place 1 drop into both eyes daily as needed for dry eyes.    . isosorbide mononitrate (IMDUR) 30 MG 24 hr tablet Take 1 tablet (30 mg total) by mouth daily. 30 tablet 3  . loratadine (CLARITIN) 10 MG tablet Take 10 mg by mouth daily.    . metoprolol succinate (TOPROL XL) 25 MG 24 hr tablet Take 1 tablet (25 mg total) by mouth daily. 90 tablet 3  .  predniSONE (DELTASONE) 5 MG tablet Take 5 mg by mouth daily.      . Probiotic Product (PROBIOTIC DAILY PO) Take 1 tablet by mouth daily.    . simvastatin (ZOCOR) 20 MG tablet Take 1 tablet by mouth at  bedtime 90 tablet 1  . warfarin (COUMADIN) 5 MG tablet Take 5-7.5 mg by mouth daily. Take 5mg  on Mon, wed, fri and saturdays. The other days take a tablet and a half (7.5 MG)     No current facility-administered medications on file prior to visit.     Review of Systems Constitutional:   No  weight loss, night sweats,  Fevers, chills, + fatigue, or  lassitude.  HEENT:   No headaches,  Difficulty swallowing,  Tooth/dental problems, or  Sore throat,                No sneezing, itching, ear ache, nasal congestion, post nasal drip,   CV:  No chest pain,  Orthopnea, PND, swelling in lower  extremities, anasarca, dizziness, palpitations, syncope.   GI  No heartburn, indigestion, abdominal pain, nausea, vomiting, diarrhea, change in bowel habits, loss of appetite, bloody stools.   Resp:   No chest wall deformity  Skin: no rash or lesions.  GU: no dysuria, change in color of urine, no urgency or frequency.  No flank pain, no hematuria   MS:  No joint pain or swelling.  No decreased range of motion.  No back pain.  Psych:  No change in mood or affect. No depression or anxiety.  No memory loss.         Objective:   Physical Exam Filed Vitals:   04/22/15 1122  BP: 124/80  Pulse: 72  Temp: 97.5 F (36.4 C)  TempSrc: Oral  Height: 5\' 5"  (1.651 m)  Weight: 268 lb (121.564 kg)  SpO2: 100%   Body mass index is 44.6 kg/(m^2).   GEN: A/Ox3; pleasant , NAD, morbidly obese   HEENT:  [place]/AT,  EACs-clear, TMs-wnl, NOSE-clear, THROAT-clear, no lesions, no postnasal drip or exudate noted.   NECK:  Supple w/ fair ROM; no JVD; normal carotid impulses w/o bruits; no thyromegaly or nodules palpated; no lymphadenopathy.  RESP  Clear  P & A; w/o, wheezes/ rales/ or rhonchi.no accessory muscle use, no dullness to percussion  CARD:  RRR, no m/r/g  , no peripheral edema, pulses intact, no cyanosis or clubbing.  GI:   Soft & nt; nml bowel sounds; no organomegaly or masses detected.  Musco: Warm bil, no deformities or joint swelling noted.   Neuro: alert, no focal deficits noted.    Skin: Warm, no lesions or rashes  Valeria Krisko NP-C   Pulmonary and Critical Care  04/22/2015        Assessment & Plan:

## 2015-04-28 ENCOUNTER — Other Ambulatory Visit: Payer: Self-pay | Admitting: Cardiovascular Disease

## 2015-04-29 LAB — PROTIME-INR
INR: 2.7 — ABNORMAL HIGH (ref 0.8–1.2)
Prothrombin Time: 27.7 s — ABNORMAL HIGH (ref 9.1–12.0)

## 2015-05-02 ENCOUNTER — Ambulatory Visit (INDEPENDENT_AMBULATORY_CARE_PROVIDER_SITE_OTHER): Payer: Medicare Other | Admitting: Pharmacist Clinician (PhC)/ Clinical Pharmacy Specialist

## 2015-05-02 DIAGNOSIS — Z952 Presence of prosthetic heart valve: Secondary | ICD-10-CM

## 2015-05-02 DIAGNOSIS — Z5181 Encounter for therapeutic drug level monitoring: Secondary | ICD-10-CM

## 2015-05-02 DIAGNOSIS — Z954 Presence of other heart-valve replacement: Secondary | ICD-10-CM

## 2015-05-10 LAB — CUP PACEART REMOTE DEVICE CHECK
Battery Remaining Longevity: 128 mo
Battery Remaining Percentage: 95.5 %
Battery Voltage: 3.01 V
Brady Statistic AP VS Percent: 1 %
Brady Statistic RA Percent Paced: 1 %
Implantable Lead Model: 5076
Lead Channel Impedance Value: 390 Ohm
Lead Channel Sensing Intrinsic Amplitude: 4.9 mV
Lead Channel Setting Pacing Amplitude: 1.125
Lead Channel Setting Pacing Pulse Width: 0.4 ms
MDC IDC LEAD IMPLANT DT: 20150116
MDC IDC LEAD IMPLANT DT: 20150116
MDC IDC LEAD LOCATION: 753859
MDC IDC LEAD LOCATION: 753860
MDC IDC MSMT LEADCHNL RV IMPEDANCE VALUE: 580 Ohm
MDC IDC MSMT LEADCHNL RV PACING THRESHOLD AMPLITUDE: 0.875 V
MDC IDC MSMT LEADCHNL RV PACING THRESHOLD PULSEWIDTH: 0.4 ms
MDC IDC SESS DTM: 20170222144141
MDC IDC SET LEADCHNL RA PACING AMPLITUDE: 2 V
MDC IDC SET LEADCHNL RV SENSING SENSITIVITY: 2 mV
MDC IDC STAT BRADY AP VP PERCENT: 1 %
MDC IDC STAT BRADY AS VP PERCENT: 99 %
MDC IDC STAT BRADY AS VS PERCENT: 1 %
MDC IDC STAT BRADY RV PERCENT PACED: 99 %
Pulse Gen Model: 2240
Pulse Gen Serial Number: 7587004

## 2015-05-11 ENCOUNTER — Encounter: Payer: Self-pay | Admitting: Cardiology

## 2015-05-12 ENCOUNTER — Telehealth: Payer: Self-pay | Admitting: Nurse Practitioner

## 2015-05-12 NOTE — Telephone Encounter (Signed)
Patient is aware Her [employer] paperwork has been left at [employer] for her to pick up.  I spoke with her yesterday 05/11/15 and made her aware of this also.

## 2015-06-01 ENCOUNTER — Other Ambulatory Visit: Payer: Self-pay | Admitting: Cardiovascular Disease

## 2015-06-02 LAB — PROTIME-INR
INR: 2.9 — ABNORMAL HIGH (ref 0.8–1.2)
Prothrombin Time: 29.5 s — ABNORMAL HIGH (ref 9.1–12.0)

## 2015-06-06 ENCOUNTER — Ambulatory Visit (INDEPENDENT_AMBULATORY_CARE_PROVIDER_SITE_OTHER): Payer: Medicare Other | Admitting: Pharmacist Clinician (PhC)/ Clinical Pharmacy Specialist

## 2015-06-06 DIAGNOSIS — Z5181 Encounter for therapeutic drug level monitoring: Secondary | ICD-10-CM

## 2015-06-06 DIAGNOSIS — Z952 Presence of prosthetic heart valve: Secondary | ICD-10-CM

## 2015-06-06 DIAGNOSIS — Z954 Presence of other heart-valve replacement: Secondary | ICD-10-CM

## 2015-06-07 ENCOUNTER — Encounter: Payer: Self-pay | Admitting: Cardiovascular Disease

## 2015-06-07 ENCOUNTER — Ambulatory Visit (INDEPENDENT_AMBULATORY_CARE_PROVIDER_SITE_OTHER): Payer: Medicare Other | Admitting: Cardiovascular Disease

## 2015-06-07 ENCOUNTER — Other Ambulatory Visit: Payer: Self-pay | Admitting: Cardiovascular Disease

## 2015-06-07 ENCOUNTER — Other Ambulatory Visit: Payer: Self-pay | Admitting: Internal Medicine

## 2015-06-07 VITALS — BP 156/92 | HR 76 | Ht 65.0 in | Wt 269.6 lb

## 2015-06-07 DIAGNOSIS — Z952 Presence of prosthetic heart valve: Secondary | ICD-10-CM

## 2015-06-07 DIAGNOSIS — I5189 Other ill-defined heart diseases: Secondary | ICD-10-CM

## 2015-06-07 DIAGNOSIS — Z0389 Encounter for observation for other suspected diseases and conditions ruled out: Secondary | ICD-10-CM

## 2015-06-07 DIAGNOSIS — I10 Essential (primary) hypertension: Secondary | ICD-10-CM

## 2015-06-07 DIAGNOSIS — E785 Hyperlipidemia, unspecified: Secondary | ICD-10-CM | POA: Diagnosis not present

## 2015-06-07 DIAGNOSIS — IMO0001 Reserved for inherently not codable concepts without codable children: Secondary | ICD-10-CM

## 2015-06-07 DIAGNOSIS — Z954 Presence of other heart-valve replacement: Secondary | ICD-10-CM

## 2015-06-07 DIAGNOSIS — I469 Cardiac arrest, cause unspecified: Secondary | ICD-10-CM | POA: Diagnosis not present

## 2015-06-07 DIAGNOSIS — I519 Heart disease, unspecified: Secondary | ICD-10-CM

## 2015-06-07 DIAGNOSIS — I701 Atherosclerosis of renal artery: Secondary | ICD-10-CM

## 2015-06-07 DIAGNOSIS — I6523 Occlusion and stenosis of bilateral carotid arteries: Secondary | ICD-10-CM

## 2015-06-07 NOTE — Assessment & Plan Note (Signed)
History of hyperlipidemia on statin therapy with her last lipid profile performed 06/02/14 revealing an LDL of 40 and HDL of 101.

## 2015-06-07 NOTE — Assessment & Plan Note (Signed)
History of hypertension blood pressure pressure today 156/92. She is on metoprolol. Continue current meds.

## 2015-06-07 NOTE — Assessment & Plan Note (Signed)
History of diastolic heart failure with admission for volume overload late last year secondary to dietary indiscretion requiring IV diuresis.

## 2015-06-07 NOTE — Assessment & Plan Note (Signed)
History of St. Jude AVR by Dr. [name] in 2006 which we follow by annual 2-D echocardiograms. She remains on Coumadin anticoagulation.

## 2015-06-07 NOTE — Assessment & Plan Note (Signed)
History of asystole with complete heart block status post permanent transvenous pacemaker insertion by Dr. Thompson Grayer which he follows as an outpatient.

## 2015-06-07 NOTE — Assessment & Plan Note (Signed)
History of known occluded left renal artery with mild right renal artery stenosis. This was last checked by duplex ultrasound 02/10/15

## 2015-06-07 NOTE — Assessment & Plan Note (Signed)
History of left carotid endarterectomy in 2000 with recent Dopplers performed 04/29/14 revealing widely patent right ICA with an occluded left ICA.

## 2015-06-07 NOTE — Assessment & Plan Note (Signed)
History of normal coronary arteries at the time of cath prior to her AVR as well as recently performed by Dr. [name] because of chest pain 04/11/15.

## 2015-06-07 NOTE — Patient Instructions (Signed)
Medication Instructions:  Your physician recommends that you continue on your current medications as directed. Please refer to the Current Medication list given to you today.   Labwork: none  Testing/Procedures: Your physician has requested that you have an echocardiogram. Echocardiography is a painless test that uses sound waves to create images of your heart. It provides your doctor with information about the size and shape of your heart and how well your heart's chambers and valves are working. This procedure takes approximately one hour. There are no restrictions for this procedure.    Follow-Up: We request that you follow-up in: 6 months with an extender and in 12 months with Dr Andria Rhein will receive a reminder letter in the mail two months in advance. If you don't receive a letter, please call our office to schedule the follow-up appointment.    Any Other Special Instructions Will Be Listed Below (If Applicable).     If you need a refill on your cardiac medications before your next appointment, please call your pharmacy.

## 2015-06-07 NOTE — Telephone Encounter (Signed)
REFILL 

## 2015-06-07 NOTE — Progress Notes (Signed)
06/07/2015 [name]   1950/10/28  [id number]  Primary Physician [name], MD Primary Cardiologist: [name] MD [name]   HPI:  The patient is a this 65 year old, moderately overweight, almost divorced Caucasian female mother of 61, grandmother to 2 grandchildren who has had a St. Jude AVR performed by Dr. Ceasar Mons September of 2006 because of severe aortic insufficiency. She had normal coronary arteries by cath at that time. A look at her renal arteries and demonstrated a 60% right renal artery stenosis and patent accessory left renal arteries. By duplex she does have a non-functioning left kidney. Her other problems include hypertension and hyperlipidemia. She also has a history of lupus followed by Dr. Jalene Mullet. An echo performed a year ago showed a well-functioning aortic prosthesis, and recent renal Dopplers revealed a widely patent right renal artery. She had a small bowel obstruction and underwent surgery at [facility] for perforated small bowel. She had an exploratory laparotomy with ileostomy and a right hemicolectomy 04/12/12. She saw [name] PAC back in our office 06/12/12 and her medicines were adjusted.  She was admitted in early January for symptomatic complete heart block underwent permanent pacemaker insertion by Dr. Thompson Grayer.[name] She was remitted approximately week later with orthostatic hypotension and was dehydrated. She responded to fluid resuscitation.  Since I saw her a year ago she was admitted with heart failure requiring diuresis secondary to diastolic dysfunction and dietary indiscretion. In addition, she recently underwent an cardiac catheterization by Dr. [name] because of chest pain 04/19/15  revealing normal coronary arteries.   Current Outpatient Prescriptions  Medication Sig Dispense Refill  . ADVAIR DISKUS 250-50 MCG/DOSE AEPB Use 1 inhalation by mouth  two times daily 180 each 1  . allopurinol (ZYLOPRIM) 100 MG tablet  Take 1 tablet by mouth daily.  3  . Calcium Carbonate (CALTRATE 600) 1500 MG TABS Take 1 tablet by mouth 2 (two) times daily.      . chlorhexidine (PERIDEX) 0.12 % solution RINSE WITH 1/2 OUNCE DAILY PRN  5  . cholecalciferol (VITAMIN D) 1000 UNITS tablet Take 1,000 Units by mouth 2 (two) times daily.    . furosemide (LASIX) 40 MG tablet Take 40 mg by mouth daily.    . hydroxychloroquine (PLAQUENIL) 200 MG tablet Take 1 tablet (200 mg total) by mouth 2 (two) times daily. 60 tablet 11  . hydroxypropyl methylcellulose / hypromellose (ISOPTO TEARS / GONIOVISC) 2.5 % ophthalmic solution Place 1 drop into both eyes daily as needed for dry eyes.    . isosorbide mononitrate (IMDUR) 30 MG 24 hr tablet Take 1 tablet (30 mg total) by mouth daily. 30 tablet 3  . loratadine (CLARITIN) 10 MG tablet Take 10 mg by mouth daily.    . metoprolol succinate (TOPROL XL) 25 MG 24 hr tablet Take 1 tablet (25 mg total) by mouth daily. 90 tablet 3  . predniSONE (DELTASONE) 5 MG tablet Take 5 mg by mouth daily.      . Probiotic Product (PROBIOTIC DAILY PO) Take 1 tablet by mouth daily.    . simvastatin (ZOCOR) 20 MG tablet Take 1 tablet by mouth at  bedtime 90 tablet 1  . warfarin (COUMADIN) 5 MG tablet Take 5-7.5 mg by mouth daily. Take 5mg  on Mon, wed, fri and saturdays. The other days take a tablet and a half (7.5 MG)     No current facility-administered medications for this visit.    No Known Allergies  Social History  Social History  . Marital Status: Divorced    Spouse Name: N/A  . Number of Children: 3  . Years of Education: N/A   Occupational History  . Retired    Social History Main Topics  . Smoking status: Never Smoker   . Smokeless tobacco: Never Used  . Alcohol Use: No  . Drug Use: No  . Sexual Activity: Not on file   Other Topics Concern  . Not on file   Social History Narrative   Lives alone in [place].     Review of Systems: General: negative for chills, fever, night sweats  or weight changes.  Cardiovascular: negative for chest pain, dyspnea on exertion, edema, orthopnea, palpitations, paroxysmal nocturnal dyspnea or shortness of breath Dermatological: negative for rash Respiratory: negative for cough or wheezing Urologic: negative for hematuria Abdominal: negative for nausea, vomiting, diarrhea, bright red blood per rectum, melena, or hematemesis Neurologic: negative for visual changes, syncope, or dizziness All other systems reviewed and are otherwise negative except as noted above.    Blood pressure 156/92, pulse 76, height 5\' 5"  (1.651 m), weight 269 lb 9.6 oz (122.29 kg), SpO2 97 %.  General appearance: alert and no distress Neck: no adenopathy, no carotid bruit, no JVD, supple, symmetrical, trachea midline and thyroid not enlarged, symmetric, no tenderness/mass/nodules Lungs: clear to auscultation bilaterally Heart: crisp prosthetic sounds Extremities: 1-2+ pitting edema bilaterally  EKG not performed today  ASSESSMENT AND PLAN:   S/P AVR (aortic valve replacement) History of St. Jude AVR by Dr. [name] in 2006 which we follow by annual 2-D echocardiograms. She remains on Coumadin anticoagulation.  Hyperlipidemia History of hyperlipidemia on statin therapy with her last lipid profile performed 06/02/14 revealing an LDL of 40 and HDL of 101.  Essential hypertension History of hypertension blood pressure pressure today 156/92. She is on metoprolol. Continue current meds.  Asystole, with syncope History of asystole with complete heart block status post permanent transvenous pacemaker insertion by Dr. Thompson Grayer which he follows as an outpatient.  Renal artery stenosis (HCC) History of known occluded left renal artery with mild right renal artery stenosis. This was last checked by duplex ultrasound 02/10/15  Carotid stenosis History of left carotid endarterectomy in 2000 with recent Dopplers performed 04/29/14 revealing widely patent right ICA  with an occluded left ICA.  Normal coronary arteries History of normal coronary arteries at the time of cath prior to her AVR as well as recently performed by Dr. [name] because of chest pain 04/11/15.  Diastolic dysfunction History of diastolic heart failure with admission for volume overload late last year secondary to dietary indiscretion requiring IV diuresis.      [name] MD FACP,FACC,FAHA, [facility] 06/07/2015 11:26 AM

## 2015-06-29 ENCOUNTER — Other Ambulatory Visit: Payer: Self-pay | Admitting: Cardiovascular Disease

## 2015-06-30 LAB — PROTIME-INR
INR: 5.8 — ABNORMAL HIGH (ref 0.8–1.2)
PROTHROMBIN TIME: 58.1 s — AB (ref 9.1–12.0)

## 2015-07-04 ENCOUNTER — Ambulatory Visit (INDEPENDENT_AMBULATORY_CARE_PROVIDER_SITE_OTHER): Payer: Medicare Other | Admitting: Pharmacist Clinician (PhC)/ Clinical Pharmacy Specialist

## 2015-07-04 DIAGNOSIS — Z5181 Encounter for therapeutic drug level monitoring: Secondary | ICD-10-CM

## 2015-07-04 DIAGNOSIS — Z952 Presence of prosthetic heart valve: Secondary | ICD-10-CM

## 2015-07-04 DIAGNOSIS — Z954 Presence of other heart-valve replacement: Secondary | ICD-10-CM

## 2015-07-11 NOTE — Progress Notes (Signed)
This encounter was created in error - please disregard.

## 2015-07-12 ENCOUNTER — Encounter: Payer: Medicare Other | Admitting: Nurse Practitioner

## 2015-07-12 ENCOUNTER — Ambulatory Visit (INDEPENDENT_AMBULATORY_CARE_PROVIDER_SITE_OTHER): Payer: Medicare Other | Admitting: Nurse Practitioner

## 2015-07-12 ENCOUNTER — Encounter: Payer: Self-pay | Admitting: Internal Medicine

## 2015-07-12 ENCOUNTER — Encounter: Payer: Self-pay | Admitting: Nurse Practitioner

## 2015-07-12 VITALS — BP 120/76 | HR 68 | Ht 65.0 in | Wt 268.0 lb

## 2015-07-12 DIAGNOSIS — R5383 Other fatigue: Secondary | ICD-10-CM

## 2015-07-12 DIAGNOSIS — R0683 Snoring: Secondary | ICD-10-CM

## 2015-07-12 DIAGNOSIS — I442 Atrioventricular block, complete: Secondary | ICD-10-CM

## 2015-07-12 DIAGNOSIS — Z952 Presence of prosthetic heart valve: Secondary | ICD-10-CM

## 2015-07-12 DIAGNOSIS — I5032 Chronic diastolic (congestive) heart failure: Secondary | ICD-10-CM | POA: Diagnosis not present

## 2015-07-12 DIAGNOSIS — Z954 Presence of other heart-valve replacement: Secondary | ICD-10-CM | POA: Diagnosis not present

## 2015-07-12 NOTE — Patient Instructions (Signed)
Medication Instructions:   Your physician recommends that you continue on your current medications as directed. Please refer to the Current Medication list given to you today.    If you need a refill on your cardiac medications before your next appointment, please call your pharmacy.  Labwork:  NONE ORDER TODAY    Testing/Procedures: Your physician has recommended that you have a sleep study. This test records several body functions during sleep, including: brain activity, eye movement, oxygen and carbon dioxide blood levels, heart rate and rhythm, breathing rate and rhythm, the flow of air through your mouth and nose, snoring, body muscle movements, and chest and belly movement.   Follow-Up:  Your physician wants you to follow-up in: [place] will receive a reminder letter in the mail two months in advance. If you don't receive a letter, please call our office to schedule the follow-up appointment.   Remote monitoring is used to monitor your Pacemaker of ICD from home. This monitoring reduces the number of office visits required to check your device to one time per year. It allows [place] to keep an eye on the functioning of your device to ensure it is working properly. You are scheduled for a device check from home on .10/13/15..You may send your transmission at any time that day. If you have a wireless device, the transmission will be sent automatically. After your physician reviews your transmission, you will receive a postcard with your next transmission date.    Any Other Special Instructions Will Be Listed Below (If Applicable).

## 2015-07-12 NOTE — Progress Notes (Signed)
Electrophysiology Office Note Date: 07/12/2015  ID:  Etter, Claytor Dec 27, 1950, MRN [id number]  PCP: [name], MD Primary Cardiologist: Gwenlyn Found Electrophysiologist: Allred  CC: Pacemaker follow-up  [name] is a 65 y.o. female seen today for Dr Rayann Heman.  She presents today for routine electrophysiology followup.  Since last being seen in our clinic, the patient reports doing reasonably well.  She has persistent shortness of breath with exertion as well as left hip pain related to bursitis.  She denies chest pain, palpitations, PND, orthopnea, nausea, vomiting, dizziness, syncope, edema, weight gain, or early satiety.  Device History: STJ dual chamber PPM implanted 2015 for transient complete heart block   Past Medical History  Diagnosis Date  . Asthma   . Lupus (systemic lupus erythematosus) ([place])   . PVD (peripheral vascular disease) (HCC)     60% R renal artery stenosis; non-functioning L kidney  . Hypertension   . Dyslipidemia   . Chronic anticoagulation   . History of echocardiogram 11/08/2011    EF >55%; mild-mod concentric LVH; trace aortic regurg.; LA mild-mod dilated  . History of nuclear stress test 01/20/2010    normal pattern of perfusion; low risk scan  . H/O Doppler ultrasound 10/03/2010    carotid duplex - R ICA normal patency; L CCA-ICA bypass gradt patent common to interal cartoid bypass graft   . H/O mechanical aortic valve replacement   . Complete heart block ([place]) 1//2015    Implantation of a dual chamber pacemaker on 03-06-2013 by Dr Rayann Heman.  The patient received a STJ model number [id number] pacemaker with model number 2088 right ventricular lead.   . Carotid artery disease [facility])    Past Surgical History  Procedure Laterality Date  . Aortic valve replacement  11/29/006    [facility]; St. Jude; on coumadin  . Exploratory laparotomy  04/12/2012    small bowel perf; ileostomy & R hemicolectomy   . Tubal ligation  11/1982  . Cholecystectomy  1999  .  Cardiac catheterization  11/01/2004    define anatomy   . Carotid endarterectomy      lt.  . Pacemaker insertion  03/06/2013    SJM Assurity DR PPM implanted by Dr Rayann Heman for complete heart block  . Permanent pacemaker insertion N/A 03/06/2013    Procedure: PERMANENT PACEMAKER INSERTION;  Surgeon: Coralyn Mark, MD;  Location: [facility] CATH LAB;  Service: Cardiovascular;  Laterality: N/A;  . Temporary pacemaker insertion N/A 03/06/2013    Procedure: TEMPORARY PACEMAKER INSERTION;  Surgeon: Coralyn Mark, MD;  Location: [facility] CATH LAB;  Service: Cardiovascular;  Laterality: N/A;  . Cardiac catheterization N/A 04/19/2015    Procedure: Coronary Angiography;  Surgeon: Peter M [name], MD;  Location: [facility] CV LAB;  Service: Cardiovascular;  Laterality: N/A;    Current Outpatient Prescriptions  Medication Sig Dispense Refill  . ADVAIR DISKUS 250-50 MCG/DOSE AEPB Use 1 inhalation by mouth  two times daily 180 each 0  . allopurinol (ZYLOPRIM) 100 MG tablet Take 1 tablet by mouth daily.  3  . Calcium Carbonate (CALTRATE 600) 1500 MG TABS Take 1 tablet by mouth 2 (two) times daily.      . chlorhexidine (PERIDEX) 0.12 % solution RINSE WITH 1/2 OUNCE DAILY PRN  5  . cholecalciferol (VITAMIN D) 1000 UNITS tablet Take 1,000 Units by mouth 2 (two) times daily.    . furosemide (LASIX) 40 MG tablet Take 40 mg by mouth daily.    . hydroxychloroquine (PLAQUENIL) 200 MG tablet Take  1 tablet (200 mg total) by mouth 2 (two) times daily. 60 tablet 11  . hydroxypropyl methylcellulose / hypromellose (ISOPTO TEARS / GONIOVISC) 2.5 % ophthalmic solution Place 1 drop into both eyes daily as needed for dry eyes.    . isosorbide mononitrate (IMDUR) 30 MG 24 hr tablet Take 1 tablet (30 mg total) by mouth daily. 30 tablet 3  . loratadine (CLARITIN) 10 MG tablet Take 10 mg by mouth daily.    . metoprolol succinate (TOPROL XL) 25 MG 24 hr tablet Take 1 tablet (25 mg total) by mouth daily. 90 tablet 3  . predniSONE (DELTASONE) 5  MG tablet Take 5 mg by mouth daily.      . Probiotic Product (PROBIOTIC DAILY PO) Take 1 tablet by mouth daily.    . simvastatin (ZOCOR) 20 MG tablet Take 1 tablet by mouth at  bedtime 90 tablet 0  . warfarin (COUMADIN) 5 MG tablet Take 5-7.5 mg by mouth daily. Take 5mg  on Mon, wed, fri and saturdays. The other days take a tablet and a half (7.5 MG)     No current facility-administered medications for this visit.    Allergies:   Review of patient's allergies indicates no known allergies.   Social History: Social History   Social History  . Marital Status: Divorced    Spouse Name: N/A  . Number of Children: 3  . Years of Education: N/A   Occupational History  . Retired    Social History Main Topics  . Smoking status: Never Smoker   . Smokeless tobacco: Never Used  . Alcohol Use: No  . Drug Use: No  . Sexual Activity: Not on file   Other Topics Concern  . Not on file   Social History Narrative   Lives alone in [place].    Family History: Family History  Problem Relation Age of Onset  . Alzheimer's disease Father   . Cirrhosis Father   . Cancer Father     prostate     Review of Systems: All other systems reviewed and are otherwise negative except as noted above.   Physical Exam: VS:  BP 120/76 mmHg  Pulse 68  Ht 5\' 5"  (1.651 m)  Wt 268 lb (121.564 kg)  BMI 44.60 kg/m2 , BMI Body mass index is 44.6 kg/(m^2).  GEN- The patient is obese appearing, alert and oriented x 3 today.   HEENT: normocephalic, atraumatic; sclera clear, conjunctiva pink; hearing intact; oropharynx clear; neck supple  Lungs- Clear to ausculation bilaterally, normal work of breathing.  No wheezes, rales, rhonchi Heart- Regular rate and rhythm (paced) GI- soft, non-tender, non-distended, bowel sounds present  Extremities- no clubbing, cyanosis, or edema  MS- no significant deformity or atrophy Skin- warm and dry, no rash or lesion; PPM pocket well healed Psych- euthymic mood, full  affect Neuro- strength and sensation are intact  PPM Interrogation- reviewed in detail today,  See PACEART report  EKG:  EKG is not ordered today.   Recent Labs: 04/13/2015: ALT 30*; Brain Natriuretic Peptide 202.9*; Hemoglobin 12.4; Platelets 266 04/18/2015: BUN 22; Creat 1.21*; Potassium 4.2; Sodium 140   Wt Readings from Last 3 Encounters:  07/12/15 268 lb (121.564 kg)  06/07/15 269 lb 9.6 oz (122.29 kg)  04/22/15 268 lb (121.564 kg)     Other studies Reviewed: Additional studies/ records that were reviewed today include: Dr Rayann Heman and Dr Kennon Holter office notes  Assessment and Plan:  1.  Transient complete heart block Normal PPM function See Claudia Desanctis Art  report No changes today  2.  Chronic diastolic heart failure Euvolemic on exam Continue current therapy  3.  S/p mechanical AVR Continue Warfarin No bleeding issues Echo 11/2014 stable   4.  Shortness of breath  Likely multifactorial Will check sleep study with persistent fatigue and snoring  5. Obesity Body mass index is 44.6 kg/(m^2).    Current medicines are reviewed at length with the patient today.   The patient does not have concerns regarding her medicines.  The following changes were made today:  none  Labs/ tests ordered today include:sleep study   Disposition:   Follow up with Merlin transmissions, Dr Gwenlyn Found as scheduled, me in 1 year    Signed, Chanetta Marshall, NP 07/12/2015 11:25 AM  [facility] [address] [place] [address] [phone] (office) [phone] (fax)

## 2015-07-19 ENCOUNTER — Telehealth: Payer: Self-pay | Admitting: Pharmacist

## 2015-07-19 LAB — PROTIME-INR: INR: 3.9 — AB (ref 0.9–1.1)

## 2015-07-19 NOTE — Telephone Encounter (Signed)
Patient called because she had INR checked at primary care visit today with Dr. Fortino Sic. She was also started on hydrocodone and a prednisone taper. Will call primary care to see if we can get results as she was supposed to be checked around this time. She will likely need to be checked again soon due to the prednisone taper. Instructed patient we would call as soon as we received results.    LM on nursing line at [place] office to phone or fax INR results to our office.

## 2015-07-21 ENCOUNTER — Ambulatory Visit (INDEPENDENT_AMBULATORY_CARE_PROVIDER_SITE_OTHER): Payer: Medicare Other | Admitting: Pharmacist

## 2015-07-21 DIAGNOSIS — Z954 Presence of other heart-valve replacement: Secondary | ICD-10-CM

## 2015-07-21 DIAGNOSIS — Z5181 Encounter for therapeutic drug level monitoring: Secondary | ICD-10-CM

## 2015-07-21 DIAGNOSIS — Z952 Presence of prosthetic heart valve: Secondary | ICD-10-CM

## 2015-07-22 ENCOUNTER — Other Ambulatory Visit: Payer: Self-pay | Admitting: Cardiovascular Disease

## 2015-07-23 LAB — PROTIME-INR
INR: 2.4 — AB (ref 0.8–1.2)
Prothrombin Time: 24.3 s — ABNORMAL HIGH (ref 9.1–12.0)

## 2015-07-26 ENCOUNTER — Ambulatory Visit: Payer: Medicare Other | Admitting: Internal Medicine

## 2015-07-27 ENCOUNTER — Ambulatory Visit (INDEPENDENT_AMBULATORY_CARE_PROVIDER_SITE_OTHER): Payer: Medicare Other | Admitting: Pharmacist Clinician (PhC)/ Clinical Pharmacy Specialist

## 2015-07-27 ENCOUNTER — Telehealth: Payer: Self-pay | Admitting: Cardiovascular Disease

## 2015-07-27 DIAGNOSIS — Z954 Presence of other heart-valve replacement: Secondary | ICD-10-CM

## 2015-07-27 DIAGNOSIS — Z5181 Encounter for therapeutic drug level monitoring: Secondary | ICD-10-CM

## 2015-07-27 DIAGNOSIS — Z952 Presence of prosthetic heart valve: Secondary | ICD-10-CM

## 2015-07-27 NOTE — Telephone Encounter (Signed)
New MEssage  Pt calling to discuss lab results. Please call back and discuss.

## 2015-07-27 NOTE — Telephone Encounter (Signed)
See anticoag note

## 2015-07-27 NOTE — Telephone Encounter (Signed)
Spoke with pt, she had INR Friday and has not heard from anyone. She has a dentist appointment this morning so if not at home, please leave a message on recorder. Forwarded to [facility] clinic.

## 2015-08-03 ENCOUNTER — Other Ambulatory Visit: Payer: Self-pay | Admitting: Cardiovascular Disease

## 2015-08-04 LAB — PROTIME-INR
INR: 3.5 — AB (ref 0.8–1.2)
Prothrombin Time: 35.4 s — ABNORMAL HIGH (ref 9.1–12.0)

## 2015-08-08 ENCOUNTER — Ambulatory Visit (INDEPENDENT_AMBULATORY_CARE_PROVIDER_SITE_OTHER): Payer: Medicare Other | Admitting: Pharmacist

## 2015-08-08 DIAGNOSIS — Z952 Presence of prosthetic heart valve: Secondary | ICD-10-CM

## 2015-08-08 DIAGNOSIS — Z954 Presence of other heart-valve replacement: Secondary | ICD-10-CM

## 2015-08-08 DIAGNOSIS — Z5181 Encounter for therapeutic drug level monitoring: Secondary | ICD-10-CM

## 2015-08-10 ENCOUNTER — Ambulatory Visit (HOSPITAL_COMMUNITY)
Admission: RE | Admit: 2015-08-10 | Discharge: 2015-08-10 | Disposition: A | Discharge status: Home / Self Care | Payer: Medicare Other | Source: Ambulatory Visit | Attending: Cardiovascular Disease | Admitting: Cardiovascular Disease

## 2015-08-10 ENCOUNTER — Ambulatory Visit (HOSPITAL_COMMUNITY)
Admission: RE | Admit: 2015-08-10 | Payer: Medicare Other | Source: Ambulatory Visit | Attending: Cardiology | Admitting: Cardiology

## 2015-08-10 DIAGNOSIS — I6523 Occlusion and stenosis of bilateral carotid arteries: Secondary | ICD-10-CM | POA: Insufficient documentation

## 2015-08-10 DIAGNOSIS — E785 Hyperlipidemia, unspecified: Secondary | ICD-10-CM | POA: Diagnosis not present

## 2015-08-10 DIAGNOSIS — I10 Essential (primary) hypertension: Secondary | ICD-10-CM | POA: Diagnosis not present

## 2015-08-15 ENCOUNTER — Other Ambulatory Visit: Payer: Self-pay | Admitting: Nurse Practitioner

## 2015-08-16 NOTE — Telephone Encounter (Signed)
Rx request sent to pharmacy.  

## 2015-08-23 ENCOUNTER — Other Ambulatory Visit: Payer: Self-pay | Admitting: Cardiovascular Disease

## 2015-09-08 ENCOUNTER — Other Ambulatory Visit: Payer: Self-pay | Admitting: Cardiovascular Disease

## 2015-09-09 LAB — PROTIME-INR
INR: 3.9 — AB (ref 0.8–1.2)
Prothrombin Time: 39.6 s — ABNORMAL HIGH (ref 9.1–12.0)

## 2015-09-14 ENCOUNTER — Encounter (HOSPITAL_BASED_OUTPATIENT_CLINIC_OR_DEPARTMENT_OTHER): Payer: Medicare Other

## 2015-09-14 ENCOUNTER — Ambulatory Visit (INDEPENDENT_AMBULATORY_CARE_PROVIDER_SITE_OTHER): Payer: Medicare Other | Admitting: Pharmacist Clinician (PhC)/ Clinical Pharmacy Specialist

## 2015-09-14 DIAGNOSIS — Z954 Presence of other heart-valve replacement: Secondary | ICD-10-CM

## 2015-09-14 DIAGNOSIS — Z5181 Encounter for therapeutic drug level monitoring: Secondary | ICD-10-CM

## 2015-09-14 DIAGNOSIS — Z952 Presence of prosthetic heart valve: Secondary | ICD-10-CM

## 2015-10-03 ENCOUNTER — Other Ambulatory Visit: Payer: Self-pay | Admitting: Cardiovascular Disease

## 2015-10-04 LAB — PROTIME-INR
INR: 4.6 — AB (ref 0.8–1.2)
PROTHROMBIN TIME: 43.5 s — AB (ref 9.1–12.0)

## 2015-10-06 ENCOUNTER — Ambulatory Visit (INDEPENDENT_AMBULATORY_CARE_PROVIDER_SITE_OTHER): Payer: Medicare Other | Admitting: Pharmacist

## 2015-10-06 DIAGNOSIS — Z952 Presence of prosthetic heart valve: Secondary | ICD-10-CM

## 2015-10-06 DIAGNOSIS — Z954 Presence of other heart-valve replacement: Secondary | ICD-10-CM

## 2015-10-06 DIAGNOSIS — Z5181 Encounter for therapeutic drug level monitoring: Secondary | ICD-10-CM

## 2015-10-19 ENCOUNTER — Other Ambulatory Visit: Payer: Self-pay | Admitting: Cardiovascular Disease

## 2015-10-20 LAB — PROTIME-INR
INR: 3 — AB (ref 0.8–1.2)
Prothrombin Time: 29.5 s — ABNORMAL HIGH (ref 9.1–12.0)

## 2015-10-21 ENCOUNTER — Telehealth: Payer: Self-pay | Admitting: *Deleted

## 2015-10-21 NOTE — Telephone Encounter (Signed)
[facility] Standing Order Request for start date of November 13, 2015 and ending date of 05/10/16, signed by Dr Gwenlyn Found and faxed to number provided.

## 2015-10-25 ENCOUNTER — Ambulatory Visit (INDEPENDENT_AMBULATORY_CARE_PROVIDER_SITE_OTHER): Payer: Medicare Other | Admitting: Pharmacist Clinician (PhC)/ Clinical Pharmacy Specialist

## 2015-10-25 DIAGNOSIS — Z952 Presence of prosthetic heart valve: Secondary | ICD-10-CM

## 2015-10-25 DIAGNOSIS — Z5181 Encounter for therapeutic drug level monitoring: Secondary | ICD-10-CM

## 2015-10-25 DIAGNOSIS — Z954 Presence of other heart-valve replacement: Secondary | ICD-10-CM

## 2015-11-14 ENCOUNTER — Telehealth: Payer: Self-pay | Admitting: Physician Assistant

## 2015-11-14 NOTE — Telephone Encounter (Signed)
Records received from [facility] for apt on 12/05/15 with Rosaria Ferries PA. Records given to nenita h (medical records) CN

## 2015-11-16 ENCOUNTER — Other Ambulatory Visit: Payer: Self-pay | Admitting: Cardiovascular Disease

## 2015-11-17 LAB — PROTIME-INR
INR: 2.3 — ABNORMAL HIGH (ref 0.8–1.2)
PROTHROMBIN TIME: 22.8 s — AB (ref 9.1–12.0)

## 2015-11-21 ENCOUNTER — Ambulatory Visit (INDEPENDENT_AMBULATORY_CARE_PROVIDER_SITE_OTHER): Payer: Medicare Other | Admitting: Pharmacist Clinician (PhC)/ Clinical Pharmacy Specialist

## 2015-11-21 DIAGNOSIS — Z5181 Encounter for therapeutic drug level monitoring: Secondary | ICD-10-CM

## 2015-11-21 DIAGNOSIS — Z952 Presence of prosthetic heart valve: Secondary | ICD-10-CM

## 2015-11-30 ENCOUNTER — Other Ambulatory Visit: Payer: Self-pay | Admitting: Cardiovascular Disease

## 2015-12-01 LAB — PROTIME-INR
INR: 4.6 — AB (ref 0.8–1.2)
PROTHROMBIN TIME: 43.5 s — AB (ref 9.1–12.0)

## 2015-12-02 ENCOUNTER — Ambulatory Visit (INDEPENDENT_AMBULATORY_CARE_PROVIDER_SITE_OTHER): Payer: Medicare Other | Admitting: Pharmacist Clinician (PhC)/ Clinical Pharmacy Specialist

## 2015-12-02 DIAGNOSIS — Z952 Presence of prosthetic heart valve: Secondary | ICD-10-CM

## 2015-12-02 DIAGNOSIS — Z5181 Encounter for therapeutic drug level monitoring: Secondary | ICD-10-CM

## 2015-12-05 ENCOUNTER — Encounter: Payer: Self-pay | Admitting: Physician Assistant

## 2015-12-05 ENCOUNTER — Ambulatory Visit (INDEPENDENT_AMBULATORY_CARE_PROVIDER_SITE_OTHER): Payer: Medicare Other | Admitting: Physician Assistant

## 2015-12-05 VITALS — BP 140/90 | HR 74 | Ht 65.0 in | Wt 265.0 lb

## 2015-12-05 DIAGNOSIS — Z952 Presence of prosthetic heart valve: Secondary | ICD-10-CM | POA: Diagnosis not present

## 2015-12-05 DIAGNOSIS — I359 Nonrheumatic aortic valve disorder, unspecified: Secondary | ICD-10-CM

## 2015-12-05 NOTE — Progress Notes (Signed)
Cardiology Office Note   Date:  12/05/2015   ID:  [name], DOB [birth year], MRN [id number]  PCP:  [name], MD  EP: Dr Rayann Heman, A. Lynnell Jude, NP Cardiologist:  Dr Gwenlyn Found 05/2015  Rosaria Ferries, PA-C   Chief Complaint  Patient presents with  . Follow-up    6 MONTH    History of Present Illness: [name] is a 65 y.o. female with a history of St Jude AVR 2006 on coumadin, nl cors 03/2015, EF 50-55% 11/2014 echo, CHB s/p STJ PPM 2015, PAD w/ 60% R-RA, L kidney non-functional, L-ICA 100% s/p L-CEA, R-ICA ok, SLE, HTN, HLD, SBO s/p R hemicolectomy 2014   [name] presents for 6 month follow-up.  Recently, she has been struggling. She has significant knee pain, and when her insurance changed, she was no longer able to go to the wife. She is trying to lose weight and has made some healthy changes to her eating habits. She is a little frustrated because she has only lost a pound or 2.  She is trying to watch her sodium, her lower extremity edema is at baseline. She is compliant with her medications and her Coumadin. Level was high this last time and she doesn't know why.  She has not had chest pain. Her activity level is poor. She has dyspnea on exertion, but that has not changed much recently. She has not been wheezing or had any acute illness. She has some chronic orthopnea that has not changed but denies PND.   Past Medical History:  Diagnosis Date  . Asthma   . Carotid artery disease ([place])   . Chronic anticoagulation   . Complete heart block ([place]) 1//2015   Implantation of a dual chamber pacemaker on 03-06-2013 by Dr Rayann Heman.  The patient received a STJ model number 9476 pacemaker with model number 2088 right ventricular lead.   [name] Dyslipidemia   . H/O Doppler ultrasound 10/03/2010   carotid duplex - R ICA normal patency; L CCA-ICA bypass gradt patent common to interal cartoid bypass graft   . H/O mechanical aortic valve replacement   . History of echocardiogram  11/08/2011   EF >55%; mild-mod concentric LVH; trace aortic regurg.; LA mild-mod dilated  . History of nuclear stress test 01/20/2010   normal pattern of perfusion; low risk scan  . Hypertension   . Lupus (systemic lupus erythematosus) ([place])   . PVD (peripheral vascular disease) (HCC)    60% R renal artery stenosis; non-functioning L kidney    Past Surgical History:  Procedure Laterality Date  . AORTIC VALVE REPLACEMENT  11/29/006   Gerhardt; [place]; on coumadin  . CARDIAC CATHETERIZATION  11/01/2004   define anatomy   . CARDIAC CATHETERIZATION N/A 04/19/2015   Procedure: Coronary Angiography;  Surgeon: Peter M [name], MD;  Location: [facility];  Service: Cardiovascular;  Laterality: N/A;  . CAROTID ENDARTERECTOMY     lt.  . CHOLECYSTECTOMY  1999  . EXPLORATORY LAPAROTOMY  04/12/2012   small bowel perf; ileostomy & R hemicolectomy   . PACEMAKER INSERTION  03/06/2013   SJM Assurity DR PPM implanted by Dr Rayann Heman for complete heart block  . PERMANENT PACEMAKER INSERTION N/A 03/06/2013   Procedure: PERMANENT PACEMAKER INSERTION;  Surgeon: Coralyn Mark, MD;  Location: [facility];  Service: Cardiovascular;  Laterality: N/A;  . TEMPORARY PACEMAKER INSERTION N/A 03/06/2013   Procedure: TEMPORARY PACEMAKER INSERTION;  Surgeon: Coralyn Mark, MD;  Location: [facility];  Service: Cardiovascular;  Laterality: N/A;  . TUBAL LIGATION  11/1982    Current Outpatient Prescriptions  Medication Sig Dispense Refill  . ADVAIR DISKUS 250-50 MCG/DOSE AEPB Use 1 inhalation by mouth  two times daily 180 each 0  . allopurinol (ZYLOPRIM) 100 MG tablet Take 1 tablet by mouth daily.  3  . Calcium Carbonate (CALTRATE 600) 1500 MG TABS Take 1 tablet by mouth 2 (two) times daily.      . chlorhexidine (PERIDEX) 0.12 % solution RINSE WITH 1/2 OUNCE DAILY PRN  5  . cholecalciferol (VITAMIN D) 1000 UNITS tablet Take 1,000 Units by mouth 2 (two) times daily.    . furosemide (LASIX) 40 MG tablet Take 40 mg  by mouth daily.    . hydroxychloroquine (PLAQUENIL) 200 MG tablet Take 1 tablet (200 mg total) by mouth 2 (two) times daily. 60 tablet 11  . hydroxypropyl methylcellulose / hypromellose (ISOPTO TEARS / GONIOVISC) 2.5 % ophthalmic solution Place 1 drop into both eyes daily as needed for dry eyes.    . isosorbide mononitrate (IMDUR) 30 MG 24 hr tablet TAKE 1 TABLET(30 MG) BY MOUTH DAILY 30 tablet 11  . loratadine (CLARITIN) 10 MG tablet Take 10 mg by mouth daily.    . metoprolol succinate (TOPROL XL) 25 MG 24 hr tablet Take 1 tablet (25 mg total) by mouth daily. 90 tablet 3  . predniSONE (DELTASONE) 5 MG tablet Take 5 mg by mouth daily.      . Probiotic Product (PROBIOTIC DAILY PO) Take 1 tablet by mouth daily.    . simvastatin (ZOCOR) 20 MG tablet Take 1 tablet by mouth at  bedtime 90 tablet 0  . warfarin (COUMADIN) 5 MG tablet Take 1 to 1 and 1/2 tablets by mouth daily as directed  by coumadin clinic 75 tablet 5   No current facility-administered medications for this visit.     Allergies:   Review of patient's allergies indicates no known allergies.    Social History:  The patient  reports that she has never smoked. She has never used smokeless tobacco. She reports that she does not drink alcohol or use drugs.   Family History:  The patient's family history includes Alzheimer's disease in her father; Cancer in her father; Cirrhosis in her father.    ROS:  Please see the history of present illness. All other systems are reviewed and negative.    PHYSICAL EXAM: VS:  BP 140/90   Pulse 74   Ht 5\' 5"  (1.651 m)   Wt 265 lb (120.2 kg)   BMI 44.10 kg/m  , BMI Body mass index is 44.1 kg/m. GEN: Well nourished, well developed, female in no acute distress  HEENT: normal for age  Neck: no JVD, no carotid bruit, no masses Cardiac: RRR; 2/6 murmur, crisp valve click, no rubs, or gallops Respiratory:  clear to auscultation bilaterally, normal work of breathing GI: soft, nontender,  nondistended, + BS MS: no deformity or atrophy; trace edema; distal pulses are 2+ in all 4 extremities   Skin: warm and dry, no rash Neuro:  Strength and sensation are intact Psych: euthymic mood, full affect   EKG:  EKG is ordered today. The ekg ordered today demonstrates sinus rhythm with ventricular pacing  ECHO: 11/2014 - Left ventricle: Wall thickness was increased in a pattern of mild   LVH. Systolic function was normal. The estimated ejection   fraction was in the range of 50% to 55%. Incoordinate septal   motion, apical hypokinesis. Diastolic  dysfunction with   indeterminate LV Filling pressures. - Aortic valve: St. Jude mechanical AVR - normal leaflet motion. No   obstruction, trivial AI cleaning jet. Valve area (VTI): 1.08   cm^2. Valve area (Vmax): 1 cm^2. Valve area (Vmean): 1.01 cm^2. - Mitral valve: Calcified annulus. Mildly thickened leaflets .   There was mild posteriorly directed regurgitation. - Left atrium: Moderately dilated at 46 ml/m2. - Right ventricle: The cavity size was moderately dilated. Pacer   wire or catheter noted in right ventricle. - Right atrium: Severely dilated at 30 cm2. Pacer wire or catheter   noted in right atrium. - Tricuspid valve: There was moderate regurgitation. - Pulmonary arteries: The main pulmonary artery is dilated. PA peak   pressure: 44 mm Hg (S). - Inferior vena cava: The IVC measures <2.1 cm, but does not   collapse >50%, suggesting an elevated RA pressure of 8 mmHg. Impressions: - Compared to the prior study in 2015, pacer or AICD wires noted.   LVEF is improved to 50-55%, the mechanical AVR shows no signs of   obstruction, moderate TR, RVSP 44 mmHg.  CATH: 03/2015 Conclusion  1. Normal coronary anatomy.  2. Normal prosthetic valve disc movement by fluoro.    CAROTID: 08/10/2015 Nonobstructive disease on the right at 1-39 percent, occluded left carotid artery Unchanged, recheck 2 years  Recent Labs: 04/13/2015: ALT  30; Brain Natriuretic Peptide 202.9; Hemoglobin 12.4; Platelets 266 04/18/2015: BUN 22; Creat 1.21; Potassium 4.2; Sodium 140    Lipid Panel    Component Value Date/Time   CHOL 157 06/02/2014 1035   TRIG 78 06/02/2014 1035   HDL 101 06/02/2014 1035   CHOLHDL 2.1 09/02/2012 0000   LDLCALC 40 06/02/2014 1035     Wt Readings from Last 3 Encounters:  12/05/15 265 lb (120.2 kg)  07/12/15 268 lb (121.6 kg)  06/07/15 269 lb 9.6 oz (122.3 kg)     Other studies Reviewed: Additional studies/ records that were reviewed today include: Office notes, hospital records and testing.  ASSESSMENT AND PLAN:  1.  AVR: She is not having any bleeding issues, and is compliant with Coumadin. Her echo is to be rechecked in a year. If she is not volume overloaded, we can push this out until early next year when she will see Dr. Gwenlyn Found. She develops any symptoms of heart failure or other cardiac issues she is to contact us in the meantime.  2. Morbid obesity: I encouraged her to continue the heart healthy dietary changes she has been making. She understands that she has to significantly limit sodium.  3. Chronic anticoagulation: This is followed closely by the Coumadin clinic and although her last INR was elevated, she has not had any bleeding issues.   Current medicines are reviewed at length with the patient today.  The patient does not have concerns regarding medicines.  The following changes have been made:  no change  Labs/ tests ordered today include:   Orders Placed This Encounter  Procedures  . ECHOCARDIOGRAM COMPLETE     Disposition:   FU with Dr. Gwenlyn Found  Signed, Rosaria Ferries, PA-C  12/05/2015 5:33 PM    [facility] Phone: [phone]; Fax: [phone]  This note was written with the assistance of speech recognition software. Please excuse any transcriptional errors.

## 2015-12-05 NOTE — Patient Instructions (Addendum)
Medication Instructions:   NO CHANGE  Testing/Procedures:  Your physician has requested that you have an echocardiogram. Echocardiography is a painless test that uses sound waves to create images of your heart. It provides your doctor with information about the size and shape of your heart and how well your heart's chambers and valves are working. This procedure takes approximately one hour. There are no restrictions for this procedure.PRIOR TO FOLLOW UP APPOINTMENT IN 6 MONTHS    Follow-Up:  Your physician wants you to follow-up in: 6 MONTHS WITH DR Andria Rhein will receive a reminder letter in the mail two months in advance. If you don't receive a letter, please call our office to schedule the follow-up appointment.   INCREASE ACTIVITY THROUGH BIKE RIDING OR WATER AEROBICS   TRACK BLOOD PRESSURE AND CALL IF CONSISTENTLY HIGHER THAN 140/85

## 2015-12-13 NOTE — Addendum Note (Signed)
Addended by: Leland Johns A on: 12/13/2015 05:01 PM   Modules accepted: Orders

## 2015-12-14 ENCOUNTER — Other Ambulatory Visit: Payer: Self-pay | Admitting: Cardiovascular Disease

## 2015-12-15 LAB — PROTIME-INR
INR: 2.8 — AB (ref 0.8–1.2)
Prothrombin Time: 28 s — ABNORMAL HIGH (ref 9.1–12.0)

## 2015-12-19 ENCOUNTER — Telehealth: Payer: Self-pay | Admitting: Pharmacist Clinician (PhC)/ Clinical Pharmacy Specialist

## 2015-12-19 NOTE — Telephone Encounter (Signed)
Patient called, states had INR drawn last Wednesday, has not heard.  No indication in chart that lab was drawn.  Patient is now on Cipro 500 mg bid x 1 week, has 2 days left.  Will go to lab today for INR.

## 2015-12-21 ENCOUNTER — Telehealth: Payer: Self-pay | Admitting: Cardiovascular Disease

## 2015-12-21 ENCOUNTER — Encounter (HOSPITAL_COMMUNITY): Payer: Self-pay

## 2015-12-21 ENCOUNTER — Observation Stay (HOSPITAL_COMMUNITY)
Admission: EM | Admit: 2015-12-21 | Discharge: 2015-12-22 | Disposition: A | Discharge status: Home / Self Care | Payer: Medicare Other | Attending: Family Medicine | Admitting: Family Medicine

## 2015-12-21 ENCOUNTER — Emergency Department (HOSPITAL_COMMUNITY): Payer: Medicare Other

## 2015-12-21 DIAGNOSIS — R002 Palpitations: Secondary | ICD-10-CM

## 2015-12-21 DIAGNOSIS — Z79899 Other long term (current) drug therapy: Secondary | ICD-10-CM | POA: Insufficient documentation

## 2015-12-21 DIAGNOSIS — Z7952 Long term (current) use of systemic steroids: Secondary | ICD-10-CM | POA: Diagnosis not present

## 2015-12-21 DIAGNOSIS — I442 Atrioventricular block, complete: Secondary | ICD-10-CM | POA: Diagnosis not present

## 2015-12-21 DIAGNOSIS — Z950 Presence of cardiac pacemaker: Secondary | ICD-10-CM | POA: Insufficient documentation

## 2015-12-21 DIAGNOSIS — Z952 Presence of prosthetic heart valve: Secondary | ICD-10-CM | POA: Diagnosis not present

## 2015-12-21 DIAGNOSIS — I10 Essential (primary) hypertension: Secondary | ICD-10-CM

## 2015-12-21 DIAGNOSIS — Z7901 Long term (current) use of anticoagulants: Secondary | ICD-10-CM | POA: Insufficient documentation

## 2015-12-21 DIAGNOSIS — Z6841 Body Mass Index (BMI) 40.0 and over, adult: Secondary | ICD-10-CM | POA: Insufficient documentation

## 2015-12-21 DIAGNOSIS — E785 Hyperlipidemia, unspecified: Secondary | ICD-10-CM | POA: Diagnosis not present

## 2015-12-21 DIAGNOSIS — M329 Systemic lupus erythematosus, unspecified: Secondary | ICD-10-CM | POA: Diagnosis not present

## 2015-12-21 DIAGNOSIS — I5033 Acute on chronic diastolic (congestive) heart failure: Secondary | ICD-10-CM | POA: Insufficient documentation

## 2015-12-21 DIAGNOSIS — I509 Heart failure, unspecified: Secondary | ICD-10-CM

## 2015-12-21 DIAGNOSIS — N183 Chronic kidney disease, stage 3 (moderate): Secondary | ICD-10-CM | POA: Insufficient documentation

## 2015-12-21 DIAGNOSIS — I130 Hypertensive heart and chronic kidney disease with heart failure and stage 1 through stage 4 chronic kidney disease, or unspecified chronic kidney disease: Secondary | ICD-10-CM | POA: Diagnosis not present

## 2015-12-21 DIAGNOSIS — J4520 Mild intermittent asthma, uncomplicated: Secondary | ICD-10-CM | POA: Diagnosis not present

## 2015-12-21 DIAGNOSIS — I5043 Acute on chronic combined systolic (congestive) and diastolic (congestive) heart failure: Secondary | ICD-10-CM | POA: Diagnosis present

## 2015-12-21 DIAGNOSIS — E8770 Fluid overload, unspecified: Secondary | ICD-10-CM | POA: Insufficient documentation

## 2015-12-21 LAB — CBC
HCT: 36.6 % (ref 36.0–46.0)
Hemoglobin: 11.6 g/dL — ABNORMAL LOW (ref 12.0–15.0)
MCH: 28.4 pg (ref 26.0–34.0)
MCHC: 31.7 g/dL (ref 30.0–36.0)
MCV: 89.5 fL (ref 78.0–100.0)
PLATELETS: 191 10*3/uL (ref 150–400)
RBC: 4.09 MIL/uL (ref 3.87–5.11)
RDW: 16.4 % — ABNORMAL HIGH (ref 11.5–15.5)
WBC: 5.6 10*3/uL (ref 4.0–10.5)

## 2015-12-21 LAB — BASIC METABOLIC PANEL
Anion gap: 8 (ref 5–15)
BUN: 26 mg/dL — ABNORMAL HIGH (ref 6–20)
CALCIUM: 9.2 mg/dL (ref 8.9–10.3)
CO2: 26 mmol/L (ref 22–32)
CREATININE: 1.4 mg/dL — AB (ref 0.44–1.00)
Chloride: 105 mmol/L (ref 101–111)
GFR calc Af Amer: 45 mL/min — ABNORMAL LOW (ref >60)
GFR calc non Af Amer: 39 mL/min — ABNORMAL LOW (ref >60)
GLUCOSE: 116 mg/dL — AB (ref 65–99)
Potassium: 3.9 mmol/L (ref 3.5–5.1)
Sodium: 139 mmol/L (ref 135–145)

## 2015-12-21 LAB — TSH: TSH: 3.022 u[IU]/mL (ref 0.350–4.500)

## 2015-12-21 LAB — MAGNESIUM: MAGNESIUM: 1.9 mg/dL (ref 1.7–2.4)

## 2015-12-21 LAB — PROTIME-INR
INR: 3.05
Prothrombin Time: 32.2 seconds — ABNORMAL HIGH (ref 11.4–15.2)

## 2015-12-21 LAB — TROPONIN I

## 2015-12-21 LAB — I-STAT TROPONIN, ED: TROPONIN I, POC: 0.01 ng/mL (ref 0.00–0.08)

## 2015-12-21 LAB — BRAIN NATRIURETIC PEPTIDE: B Natriuretic Peptide: 490.2 pg/mL — ABNORMAL HIGH (ref 0.0–100.0)

## 2015-12-21 MED ORDER — HYDROXYCHLOROQUINE SULFATE 200 MG PO TABS
200.0000 mg | ORAL_TABLET | Freq: Two times a day (BID) | ORAL | Status: DC
  Administered 2015-12-21 – 2015-12-22 (×2): 200 mg via ORAL
  Filled 2015-12-21 (×2): qty 1

## 2015-12-21 MED ORDER — PREDNISONE 5 MG PO TABS
5.0000 mg | ORAL_TABLET | Freq: Every day | ORAL | Status: DC
  Administered 2015-12-22: 5 mg via ORAL
  Filled 2015-12-21: qty 1

## 2015-12-21 MED ORDER — METOPROLOL SUCCINATE ER 25 MG PO TB24
25.0000 mg | ORAL_TABLET | Freq: Every day | ORAL | Status: DC
  Administered 2015-12-21 – 2015-12-22 (×2): 25 mg via ORAL
  Filled 2015-12-21 (×2): qty 1

## 2015-12-21 MED ORDER — WARFARIN - PHARMACIST DOSING INPATIENT: Freq: Every day | Status: DC

## 2015-12-21 MED ORDER — VITAMIN D 1000 UNITS PO TABS
2000.0000 [IU] | ORAL_TABLET | Freq: Every day | ORAL | Status: DC
  Administered 2015-12-22: 2000 [IU] via ORAL
  Filled 2015-12-21: qty 2

## 2015-12-21 MED ORDER — ACETAMINOPHEN 325 MG PO TABS: 650.0000 mg | ORAL_TABLET | Freq: Four times a day (QID) | ORAL | Status: DC | PRN

## 2015-12-21 MED ORDER — ALLOPURINOL 100 MG PO TABS
100.0000 mg | ORAL_TABLET | Freq: Every day | ORAL | Status: DC
  Administered 2015-12-22: 100 mg via ORAL
  Filled 2015-12-21: qty 1

## 2015-12-21 MED ORDER — CALCIUM CARBONATE 1250 (500 CA) MG PO TABS
1.0000 | ORAL_TABLET | Freq: Two times a day (BID) | ORAL | Status: DC
  Administered 2015-12-22: 500 mg via ORAL
  Filled 2015-12-21: qty 1

## 2015-12-21 MED ORDER — ONDANSETRON HCL 4 MG/2ML IJ SOLN: 4.0000 mg | Freq: Four times a day (QID) | INTRAMUSCULAR | Status: DC | PRN

## 2015-12-21 MED ORDER — WARFARIN SODIUM 5 MG PO TABS
5.0000 mg | ORAL_TABLET | Freq: Once | ORAL | Status: AC
  Administered 2015-12-21: 5 mg via ORAL
  Filled 2015-12-21: qty 1

## 2015-12-21 MED ORDER — ISOSORBIDE MONONITRATE ER 30 MG PO TB24
30.0000 mg | ORAL_TABLET | Freq: Every day | ORAL | Status: DC
  Administered 2015-12-22: 30 mg via ORAL
  Filled 2015-12-21: qty 1

## 2015-12-21 MED ORDER — SIMVASTATIN 20 MG PO TABS
20.0000 mg | ORAL_TABLET | Freq: Every day | ORAL | Status: DC
  Administered 2015-12-21: 20 mg via ORAL
  Filled 2015-12-21: qty 1

## 2015-12-21 MED ORDER — WARFARIN SODIUM 5 MG PO TABS: 5.0000 mg | ORAL_TABLET | Freq: Once | ORAL | Status: DC

## 2015-12-21 MED ORDER — LORATADINE 10 MG PO TABS
10.0000 mg | ORAL_TABLET | Freq: Every day | ORAL | Status: DC
  Administered 2015-12-22: 10 mg via ORAL
  Filled 2015-12-21: qty 1

## 2015-12-21 MED ORDER — FUROSEMIDE 10 MG/ML IJ SOLN
60.0000 mg | Freq: Once | INTRAMUSCULAR | Status: AC
  Administered 2015-12-21: 60 mg via INTRAVENOUS
  Filled 2015-12-21: qty 6

## 2015-12-21 MED ORDER — ACETAMINOPHEN 650 MG RE SUPP: 650.0000 mg | Freq: Four times a day (QID) | RECTAL | Status: DC | PRN

## 2015-12-21 MED ORDER — MOMETASONE FURO-FORMOTEROL FUM 200-5 MCG/ACT IN AERO
2.0000 | INHALATION_SPRAY | Freq: Two times a day (BID) | RESPIRATORY_TRACT | Status: DC
  Administered 2015-12-22: 2 via RESPIRATORY_TRACT
  Filled 2015-12-21: qty 8.8

## 2015-12-21 MED ORDER — FUROSEMIDE 10 MG/ML IJ SOLN
40.0000 mg | Freq: Every day | INTRAMUSCULAR | Status: DC
  Administered 2015-12-22: 40 mg via INTRAVENOUS
  Filled 2015-12-21: qty 4

## 2015-12-21 MED ORDER — ONDANSETRON HCL 4 MG PO TABS: 4.0000 mg | ORAL_TABLET | Freq: Four times a day (QID) | ORAL | Status: DC | PRN

## 2015-12-21 MED ORDER — SODIUM CHLORIDE 0.9% FLUSH
3.0000 mL | Freq: Two times a day (BID) | INTRAVENOUS | Status: DC
  Administered 2015-12-21 – 2015-12-22 (×2): 3 mL via INTRAVENOUS

## 2015-12-21 NOTE — H&P (Signed)
History and Physical    [name] GQQ:[id number] DOB: 11-07-1950 DOA: 12/21/2015  PCP: [name], MD  Patient coming from: Home.  Chief Complaint: Palpitations.  HPI: [name] is a 65 y.o. female with mechanical aortic valve replacement on Coumadin, hypertension, complete heart block status post pacemaker placement, asthma, diastolic CHF presents to the ER because of palpitations. Patient states patient has been getting episodes of palpitation off and on last few days last for a few minutes each time. Patient also does experienced shortness of breath with increasing lower extremity edema. In the ER patient's pacemaker was interrogated which did not show anything abnormal as per the ER physician. Chest x-ray shows congestion and BNP is around 490. ER physician discussed with on-call cardiologist who recommended admission for observation. Patient denies any chest pain.  Patient states last week patient had abdominal cramps with diarrhea and was placed on Cipro which patient had taken 4 days last dose was yesterday. Diarrhea resolved 2 days ago.  ED Course: Patient was given Lasix 60 mg IV in the ER. On exam patient has mild edema of the lower extremities.  Review of Systems: As per HPI, rest all negative.   Past Medical History:  Diagnosis Date  . Asthma   . Carotid artery disease ([place])   . Chronic anticoagulation   . Complete heart block ([place]) 1//2015   Implantation of a dual chamber pacemaker on 03-06-2013 by Dr Rayann Heman.  The patient received a STJ model number [id number] pacemaker with model number 2088 right ventricular lead.   [name] Dyslipidemia   . H/O Doppler ultrasound 10/03/2010   carotid duplex - R ICA normal patency; L CCA-ICA bypass gradt patent common to interal cartoid bypass graft   . H/O mechanical aortic valve replacement   . History of echocardiogram 11/08/2011   EF >55%; mild-mod concentric LVH; trace aortic regurg.; LA mild-mod dilated  . History of nuclear stress  test 01/20/2010   normal pattern of perfusion; low risk scan  . Hypertension   . Lupus (systemic lupus erythematosus) ([place])   . PVD (peripheral vascular disease) (H[facility])    60% R renal artery stenosis; non-functioning L kidney    Past Surgical History:  Procedure Laterality Date  . AORTIC VALVE REPLACEMENT  11/29/006   Gerhardt; [place]; on coumadin  . CARDIAC CATHETERIZATION  11/01/2004   define anatomy   . CARDIAC CATHETERIZATION N/A 04/19/2015   Procedure: Coronary Angiography;  Surgeon: Peter M [name], MD;  Location: [facility];  Service: Cardiovascular;  Laterality: N/A;  . CAROTID ENDARTERECTOMY     lt.  . CHOLECYSTECTOMY  1999  . EXPLORATORY LAPAROTOMY  04/12/2012   small bowel perf; ileostomy & R hemicolectomy   . PACEMAKER INSERTION  03/06/2013   SJM Assurity DR PPM implanted by Dr Rayann Heman for complete heart block  . PERMANENT PACEMAKER INSERTION N/A 03/06/2013   Procedure: PERMANENT PACEMAKER INSERTION;  Surgeon: Coralyn Mark, MD;  Location: [facility];  Service: Cardiovascular;  Laterality: N/A;  . TEMPORARY PACEMAKER INSERTION N/A 03/06/2013   Procedure: TEMPORARY PACEMAKER INSERTION;  Surgeon: Coralyn Mark, MD;  Location: [facility];  Service: Cardiovascular;  Laterality: N/A;  . TUBAL LIGATION  11/1982     reports that she has never smoked. She has never used smokeless tobacco. She reports that she does not drink alcohol or use drugs.  No Known Allergies  Family History  Problem Relation Age of Onset  . Alzheimer's disease Father   .  Cirrhosis Father   . Cancer Father     prostate  . Heart attack Neg Hx     Prior to Admission medications   Medication Sig Start Date End Date Taking? Authorizing Provider  acetaminophen (TYLENOL) 325 MG tablet Take 650 mg by mouth every 6 (six) hours as needed for moderate pain.    Yes Historical Provider, MD  ADVAIR DISKUS 250-50 MCG/DOSE AEPB Use 1 inhalation by mouth  two times daily 06/07/15  Yes Deneise Lever, MD    allopurinol (ZYLOPRIM) 100 MG tablet Take 1 tablet by mouth daily. 09/09/14  Yes Historical Provider, MD  Calcium Carbonate (CALTRATE 600) 1500 MG TABS Take 1 tablet by mouth 2 (two) times daily.     Yes Historical Provider, MD  chlorhexidine (PERIDEX) 0.12 % solution USES ONCE EVERY FEW WEEKS IN WATER PIK 03/31/15  Yes Historical Provider, MD  cholecalciferol (VITAMIN D) 1000 UNITS tablet Take 2,000 Units by mouth daily.    Yes Historical Provider, MD  ciprofloxacin (CIPRO) 500 MG tablet Take 500 mg by mouth 2 (two) times daily. 12/14/15  Yes Historical Provider, MD  furosemide (LASIX) 40 MG tablet Take 40 mg by mouth daily.   Yes Historical Provider, MD  hydroxychloroquine (PLAQUENIL) 200 MG tablet Take 1 tablet (200 mg total) by mouth 2 (two) times daily. 07/04/12  Yes [name], MD  isosorbide mononitrate (IMDUR) 30 MG 24 hr tablet TAKE 1 TABLET(30 MG) BY MOUTH DAILY 08/16/15  Yes [name], MD  loratadine (CLARITIN) 10 MG tablet Take 10 mg by mouth daily.   Yes Historical Provider, MD  metoprolol succinate (TOPROL XL) 25 MG 24 hr tablet Take 1 tablet (25 mg total) by mouth daily. 04/08/15  Yes Thompson Grayer, MD  predniSONE (DELTASONE) 5 MG tablet Take 5 mg by mouth daily.     Yes Historical Provider, MD  Probiotic Product (PROBIOTIC DAILY PO) Take 1 tablet by mouth daily.   Yes Historical Provider, MD  simvastatin (ZOCOR) 20 MG tablet Take 1 tablet by mouth at  bedtime 06/07/15  Yes [name], MD  warfarin (COUMADIN) 5 MG tablet Take 1 to 1 and 1/2 tablets by mouth daily as directed  by coumadin clinic Patient taking differently: TAKES 7.5MG  ON TUES AND THURS, TAKES 5MG  ALL OTHER DAYS 08/24/15  Yes [name], MD    Physical Exam: Vitals:   12/21/15 1730 12/21/15 1800 12/21/15 1930 12/21/15 2100  BP: 133/71 130/69 126/66 132/62  Pulse: (!) 55 69 72 71  Resp: 23 19 19 18   Temp:    98 F (36.7 C)  TempSrc:    Oral  SpO2:  100% 100% 100%  Weight:    120.9 kg (266 lb  9.6 oz)  Height:    5\' 5"  (1.651 m)      Constitutional: Moderately built and nourished. Vitals:   12/21/15 1730 12/21/15 1800 12/21/15 1930 12/21/15 2100  BP: 133/71 130/69 126/66 132/62  Pulse: (!) 55 69 72 71  Resp: 23 19 19 18   Temp:    98 F (36.7 C)  TempSrc:    Oral  SpO2:  100% 100% 100%  Weight:    120.9 kg (266 lb 9.6 oz)  Height:    5\' 5"  (1.651 m)   Eyes: Anicteric no pallor. ENMT: No discharge from the ears eyes nose and mouth. Neck: No JVD appreciated no mass felt. Respiratory: No rhonchi or crepitations. Cardiovascular: S1-S2 heard no murmurs appreciated. Abdomen: Soft nontender bowel sounds  present. Musculoskeletal: Bilateral lower extremity edema present. Skin: No rash. Skin appears warm. Neurologic: Alert awake oriented to time place and person. Moves all extremities. Psychiatric: Appears normal. Normal affect.   Labs on Admission: I have personally reviewed following labs and imaging studies  CBC:  Recent Labs Lab 12/21/15 1447  WBC 5.6  HGB 11.6*  HCT 36.6  MCV 89.5  PLT 749   Basic Metabolic Panel:  Recent Labs Lab 12/21/15 1447  NA 139  K 3.9  CL 105  CO2 26  GLUCOSE 116*  BUN 26*  CREATININE 1.40*  CALCIUM 9.2   GFR: Estimated Creatinine Clearance: 53.6 mL/min (by C-G formula based on SCr of 1.4 mg/dL (H)). Liver Function Tests: No results for input(s): AST, ALT, ALKPHOS, BILITOT, PROT, ALBUMIN in the last 168 hours. No results for input(s): LIPASE, AMYLASE in the last 168 hours. No results for input(s): AMMONIA in the last 168 hours. Coagulation Profile:  Recent Labs Lab 12/21/15 1447  INR 3.05   Cardiac Enzymes:  Recent Labs Lab 12/21/15 1447  TROPONINI <0.03   BNP (last 3 results) No results for input(s): PROBNP in the last 8760 hours. HbA1C: No results for input(s): HGBA1C in the last 72 hours. CBG: No results for input(s): GLUCAP in the last 168 hours. Lipid Profile: No results for input(s): CHOL, HDL,  LDLCALC, TRIG, CHOLHDL, LDLDIRECT in the last 72 hours. Thyroid Function Tests: No results for input(s): TSH, T4TOTAL, FREET4, T3FREE, THYROIDAB in the last 72 hours. Anemia Panel: No results for input(s): VITAMINB12, FOLATE, FERRITIN, TIBC, IRON, RETICCTPCT in the last 72 hours. Urine analysis:    Component Value Date/Time   COLORURINE YELLOW 03/08/2013 1212   APPEARANCEUR CLEAR 03/08/2013 1212   LABSPEC 1.030 03/08/2013 1212   PHURINE 5.0 03/08/2013 1212   GLUCOSEU NEGATIVE 03/08/2013 1212   HGBUR NEGATIVE 03/08/2013 1212   BILIRUBINUR NEGATIVE 03/08/2013 1212   KETONESUR NEGATIVE 03/08/2013 1212   PROTEINUR 30 (A) 03/08/2013 1212   UROBILINOGEN 0.2 03/08/2013 1212   NITRITE NEGATIVE 03/08/2013 1212   LEUKOCYTESUR NEGATIVE 03/08/2013 1212   Sepsis Labs: @LABRCNTIP (procalcitonin:4,lacticidven:4) )No results found for this or any previous visit (from the past 240 hour(s)).   Radiological Exams on Admission: Dg Chest Port 1 View  Result Date: 12/21/2015 CLINICAL DATA:  Irregular heart rate EXAM: PORTABLE CHEST 1 VIEW COMPARISON:  04/22/2015 FINDINGS: Surgical clips in the left neck. Left-sided pacemaker device with leads overlying right atrium and right ventricle. Post sternotomy changes. There is moderate cardiomegaly. Mild central vascular congestion. Mild perihilar interstitial opacities suggest mild edema. No focal consolidation. No effusion. No pneumothorax. IMPRESSION: 1. Stable moderate cardiomegaly with mild central congestion. Mild perihilar interstitial opacities suggest mild edema. 2. No focal consolidation or effusion. Electronically Signed   By: Donavan Foil M.D.   On: 12/21/2015 16:31    EKG: Independently reviewed. Sinus rhythm with LBBB.  Assessment/Plan Principal Problem:   Acute on chronic diastolic CHF (congestive heart failure) (HCC) Active Problems:   S/P AVR (aortic valve replacement)   Hyperlipidemia   Essential hypertension   Complete heart block  (HCC)   Cardiac pacemaker in situ - St Jude   Palpitations   CHF (congestive heart failure) ([place])    1. Acute on chronic diastolic CHF last EF 44-96% - patient was given Lasix 60 mg IV in the ER. I have continued on Lasix 40 mg IV daily. Closely follow intake output metabolic panel and daily weights. Patient is due for her yearly 2-D echo. 2. Palpitations -  check TSH. Closely monitor in telemetry. 3. Asthma - presently not wheezing. 4. History of mechanical aortic valve replacement - check 2-D echo. Coumadin per pharmacy. 5. Recently treated for gastroenteritis with the Cipro. No further diarrhea. 6. Hyperlipidemia on statins. 7. Hypertension - on Imdur and Toprol. 8. Chronic kidney disease stage III - creatinine appears to be at baseline. 9. History of lupus on plaquenil and steroids.    DVT prophylaxis: Coumadin.  Code Status: Full code.  Family Communication: Discuss with patient.  Disposition Plan: Home.  Consults called: None.  Admission status: Observation.    Rise Patience MD [facility] Pager [phone].  If 7PM-7AM, please contact night-coverage [email or web address] Password [email or web address]  12/21/2015, 10:26 PM

## 2015-12-21 NOTE — Telephone Encounter (Signed)
Phone goes to VM. Left msg for patient to call.

## 2015-12-21 NOTE — ED Triage Notes (Signed)
Per [facility]: pt called out for chest pain, pt then stated that she has not had any chest pain. She did say that she feels like she is having palpitations. Pt had 2 episodes with EMS, lasted about 10-15 seconds each time. Pt does have a pacemaker. Pt states that she has had two cardiac arrests, and that is why she got the pacemaker. Pt has no shortness of breath, no nausea, no other complaints.

## 2015-12-21 NOTE — ED Notes (Signed)
The pt has had an  Irregular heart rate rhy all day getting worse this afternoon  She has a pacemaker a st judes..  We are unable to interrogate it here  The machine does not appear to be working  [employer] called     The pt has no chest pain  Warm blankets given

## 2015-12-21 NOTE — ED Provider Notes (Signed)
[facility] DEPT Provider Note   CSN: [id number] Arrival date & time: 12/21/15  1423     History   Chief Complaint Chief Complaint  Patient presents with  . Palpitations    HPI [name] is a 65 y.o. female.  Patient presents by EMS for 2-3 days of palpitations, worsened when she is resting and not exacerbated by exercise. Has a pacemaker and a history of aortic valve replacement, last INR this week was 4. She has not had chest pain recently though does note that she has experienced a "burning" sensation in her chest since she has been in the ED. She denies recent fevers or cough. Denies recent shortness of breath. No recent weight gain. Reports slightly worsened leg swelling than normal. Denies recent changes in her diet, fluid intake, urination, and no new medication changes except for an antibiotic she started taking for "stomach pain".   The history is provided by the patient. No language interpreter was used.  Palpitations   This is a recurrent problem. The current episode started more than 2 days ago. The problem occurs constantly. The problem has not changed since onset.The problem is associated with an unknown factor. Associated symptoms include malaise/fatigue, chest pain, chest pressure, abdominal pain and lower extremity edema. Pertinent negatives include no diaphoresis, no fever, no exertional chest pressure, no irregular heartbeat, no nausea, no vomiting, no cough, no hemoptysis and no shortness of breath. She has tried bed rest for the symptoms. The treatment provided no relief. Risk factors include obesity. Her past medical history is significant for heart disease and valve disorder.    Past Medical History:  Diagnosis Date  . Asthma   . Carotid artery disease ([place])   . Chronic anticoagulation   . Complete heart block ([place]) 1//2015   Implantation of a dual chamber pacemaker on 03-06-2013 by Dr Rayann Heman.  The patient received a STJ model number 8588 pacemaker with  model number 2088 right ventricular lead.   [name] Dyslipidemia   . H/O Doppler ultrasound 10/03/2010   carotid duplex - R ICA normal patency; L CCA-ICA bypass gradt patent common to interal cartoid bypass graft   . H/O mechanical aortic valve replacement   . History of echocardiogram 11/08/2011   EF >55%; mild-mod concentric LVH; trace aortic regurg.; LA mild-mod dilated  . History of nuclear stress test 01/20/2010   normal pattern of perfusion; low risk scan  . Hypertension   . Lupus (systemic lupus erythematosus) ([place])   . PVD (peripheral vascular disease) ([facility])    60% R renal artery stenosis; non-functioning L kidney    Patient Active Problem List   Diagnosis Date Noted  . Hypervolemia 12/21/2015  . Palpitations 12/21/2015  . CHF (congestive heart failure) ([place]) 12/21/2015  . Chest pain with moderate risk for cardiac etiology 04/19/2015  . Diastolic dysfunction 50/27/7412  . Renal insufficiency 02/10/2015  . Acute on chronic diastolic CHF (congestive heart failure) ([place]) 01/28/2015  . Hypokalemia 01/28/2015  . Normal coronary arteries 12/09/2014  . Morbid obesity ([place]) 12/09/2014  . Acute congestive heart failure with left ventricular diastolic dysfunction ([place]) 07/08/2013  . Encounter for therapeutic drug monitoring 03/16/2013  . Renal artery stenosis ([place]) 03/16/2013  . Carotid stenosis 03/16/2013  . Cardiac pacemaker in situ - St Jude 03/16/2013  . Orthostatic hypotension 03/09/2013  . Dizziness 03/08/2013  . Dizziness of unknown cause 03/08/2013  . Symptomatic bradycardia 03/06/2013  . Asystole, with syncope 03/06/2013  . Anticoagulated on Coumadin for mechanical valve 03/06/2013  .  Complete heart block ([place]) 03/06/2013  . Hyperlipidemia 08/28/2012  . Essential hypertension 08/28/2012  . Long term (current) use of anticoagulants - Coumadin for mechanical AoV 05/09/2012  . Seasonal and perennial allergic rhinitis 11/14/2011  . Asthma, mild intermittent 08/26/2007  . LUPUS  ([facility]) 08/26/2007  . S/P AVR (aortic valve replacement) 08/26/2007    Past Surgical History:  Procedure Laterality Date  . AORTIC VALVE REPLACEMENT  11/29/006   [facility]; [place]; on coumadin  . CARDIAC CATHETERIZATION  11/01/2004   define anatomy   . CARDIAC CATHETERIZATION N/A 04/19/2015   Procedure: Coronary Angiography;  Surgeon: Peter M [name], MD;  Location: [facility];  Service: Cardiovascular;  Laterality: N/A;  . CAROTID ENDARTERECTOMY     lt.  . CHOLECYSTECTOMY  1999  . EXPLORATORY LAPAROTOMY  04/12/2012   small bowel perf; ileostomy & R hemicolectomy   . PACEMAKER INSERTION  03/06/2013   SJM Assurity DR PPM implanted by Dr Rayann Heman for complete heart block  . PERMANENT PACEMAKER INSERTION N/A 03/06/2013   Procedure: PERMANENT PACEMAKER INSERTION;  Surgeon: Coralyn Mark, MD;  Location: [facility];  Service: Cardiovascular;  Laterality: N/A;  . TEMPORARY PACEMAKER INSERTION N/A 03/06/2013   Procedure: TEMPORARY PACEMAKER INSERTION;  Surgeon: Coralyn Mark, MD;  Location: [facility];  Service: Cardiovascular;  Laterality: N/A;  . TUBAL LIGATION  11/1982    OB History    No data available       Home Medications    Prior to Admission medications   Medication Sig Start Date End Date Taking? Authorizing Provider  acetaminophen (TYLENOL) 325 MG tablet Take 650 mg by mouth every 6 (six) hours as needed for moderate pain.    Yes Historical Provider, MD  ADVAIR DISKUS 250-50 MCG/DOSE AEPB Use 1 inhalation by mouth  two times daily 06/07/15  Yes Deneise Lever, MD  allopurinol (ZYLOPRIM) 100 MG tablet Take 1 tablet by mouth daily. 09/09/14  Yes Historical Provider, MD  Calcium Carbonate (CALTRATE 600) 1500 MG TABS Take 1 tablet by mouth 2 (two) times daily.     Yes Historical Provider, MD  chlorhexidine (PERIDEX) 0.12 % solution USES ONCE EVERY FEW WEEKS IN WATER PIK 03/31/15  Yes Historical Provider, MD  cholecalciferol (VITAMIN D) 1000 UNITS tablet Take 2,000 Units by  mouth daily.    Yes Historical Provider, MD  ciprofloxacin (CIPRO) 500 MG tablet Take 500 mg by mouth 2 (two) times daily. 12/14/15  Yes Historical Provider, MD  furosemide (LASIX) 40 MG tablet Take 40 mg by mouth daily.   Yes Historical Provider, MD  hydroxychloroquine (PLAQUENIL) 200 MG tablet Take 1 tablet (200 mg total) by mouth 2 (two) times daily. 07/04/12  Yes [name], MD  isosorbide mononitrate (IMDUR) 30 MG 24 hr tablet TAKE 1 TABLET(30 MG) BY MOUTH DAILY 08/16/15  Yes [name], MD  loratadine (CLARITIN) 10 MG tablet Take 10 mg by mouth daily.   Yes Historical Provider, MD  metoprolol succinate (TOPROL XL) 25 MG 24 hr tablet Take 1 tablet (25 mg total) by mouth daily. 04/08/15  Yes Thompson Grayer, MD  predniSONE (DELTASONE) 5 MG tablet Take 5 mg by mouth daily.     Yes Historical Provider, MD  Probiotic Product (PROBIOTIC DAILY PO) Take 1 tablet by mouth daily.   Yes Historical Provider, MD  simvastatin (ZOCOR) 20 MG tablet Take 1 tablet by mouth at  bedtime 06/07/15  Yes [name], MD  warfarin (COUMADIN) 5 MG  tablet Take 1 to 1 and 1/2 tablets by mouth daily as directed  by coumadin clinic Patient taking differently: TAKES 7.5MG  ON TUES AND THURS, TAKES 5MG  ALL OTHER DAYS 08/24/15  Yes [name], MD    Family History Family History  Problem Relation Age of Onset  . Alzheimer's disease Father   . Cirrhosis Father   . Cancer Father     prostate  . Heart attack Neg Hx     Social History Social History  Substance Use Topics  . Smoking status: Never Smoker  . Smokeless tobacco: Never Used  . Alcohol use No     Allergies   Review of patient's allergies indicates no known allergies.   Review of Systems Review of Systems  Constitutional: Positive for malaise/fatigue. Negative for diaphoresis and fever.  HENT: Negative.   Respiratory: Positive for chest tightness. Negative for cough, hemoptysis and shortness of breath.   Cardiovascular: Positive for  chest pain and palpitations.  Gastrointestinal: Positive for abdominal pain. Negative for nausea and vomiting.  Genitourinary: Negative.   Musculoskeletal: Negative.   Skin: Negative.   Allergic/Immunologic: Negative for immunocompromised state.  Neurological: Negative.   Hematological: Bruises/bleeds easily.  Psychiatric/Behavioral: Negative.      Physical Exam Updated Vital Signs BP 132/62 (BP Location: Left Wrist)   Pulse 71   Temp 98 F (36.7 C) (Oral)   Resp 18   Ht 5\' 5"  (1.651 m)   Wt 120.9 kg Comment: scale c  SpO2 100%   BMI 44.36 kg/m   Physical Exam  Constitutional: She is oriented to person, place, and time. She appears well-developed and well-nourished. No distress.  HENT:  Head: Normocephalic and atraumatic.  Eyes: Conjunctivae and EOM are normal.  Neck: Normal range of motion. Neck supple.  Cardiovascular: Normal rate, regular rhythm and intact distal pulses.  Exam reveals no gallop and no friction rub.   Murmur heard.  Systolic murmur is present with a grade of 2/6  Pulses:      Radial pulses are 2+ on the right side, and 2+ on the left side.  Pulmonary/Chest: Effort normal and breath sounds normal.  Abdominal: Soft.  Musculoskeletal:  2+ pitting edema to knees bilaterally  Neurological: She is alert and oriented to person, place, and time.  Skin: Skin is warm and dry. She is not diaphoretic. No pallor.  Psychiatric: She has a normal mood and affect. Her behavior is normal. Judgment and thought content normal.     ED Treatments / Results  Labs (all labs ordered are listed, but only abnormal results are displayed) Labs Reviewed  BASIC METABOLIC PANEL - Abnormal; Notable for the following:       Result Value   Glucose, Bld 116 (*)    BUN 26 (*)    Creatinine, Ser 1.40 (*)    GFR calc non Af Amer 39 (*)    GFR calc Af Amer 45 (*)    All other components within normal limits  CBC - Abnormal; Notable for the following:    Hemoglobin 11.6 (*)     RDW 16.4 (*)    All other components within normal limits  PROTIME-INR - Abnormal; Notable for the following:    Prothrombin Time 32.2 (*)    All other components within normal limits  BRAIN NATRIURETIC PEPTIDE - Abnormal; Notable for the following:    B Natriuretic Peptide 490.2 (*)    All other components within normal limits  TROPONIN I  BASIC METABOLIC PANEL  CBC  WITH DIFFERENTIAL/PLATELET  MAGNESIUM  TSH  TROPONIN I  TROPONIN I  TROPONIN I  PROTIME-INR  I-STAT TROPOININ, ED    EKG  EKG Interpretation  Date/Time:  Wednesday December 21 2015 14:42:40 EDT Ventricular Rate:  74 PR Interval:    QRS Duration: 204 QT Interval:  485 QTC Calculation: 539 R Axis:   -66 Text Interpretation:  Sinus rhythm Left bundle branch block No significant change since last tracing Confirmed by [facility] MD, PEDRO [phone]) on 12/21/2015 2:50:32 PM       Radiology Dg Chest Port 1 View  Result Date: 12/21/2015 CLINICAL DATA:  Irregular heart rate EXAM: PORTABLE CHEST 1 VIEW COMPARISON:  04/22/2015 FINDINGS: Surgical clips in the left neck. Left-sided pacemaker device with leads overlying right atrium and right ventricle. Post sternotomy changes. There is moderate cardiomegaly. Mild central vascular congestion. Mild perihilar interstitial opacities suggest mild edema. No focal consolidation. No effusion. No pneumothorax. IMPRESSION: 1. Stable moderate cardiomegaly with mild central congestion. Mild perihilar interstitial opacities suggest mild edema. 2. No focal consolidation or effusion. Electronically Signed   By: Donavan Foil M.D.   On: 12/21/2015 16:31    Procedures Procedures (including critical care time)  Medications Ordered in ED Medications  isosorbide mononitrate (IMDUR) 24 hr tablet 30 mg (not administered)  simvastatin (ZOCOR) tablet 20 mg (20 mg Oral Given 12/21/15 2254)  mometasone-formoterol (DULERA) 200-5 MCG/ACT inhaler 2 puff (2 puffs Inhalation Not Given 12/21/15 2314)    metoprolol succinate (TOPROL-XL) 24 hr tablet 25 mg (25 mg Oral Given 12/21/15 2254)  allopurinol (ZYLOPRIM) tablet 100 mg (not administered)  cholecalciferol (VITAMIN D) tablet 2,000 Units (not administered)  loratadine (CLARITIN) tablet 10 mg (not administered)  hydroxychloroquine (PLAQUENIL) tablet 200 mg (200 mg Oral Given 12/21/15 2257)  calcium carbonate (OS-CAL - dosed in mg of elemental calcium) tablet 500 mg of elemental calcium (not administered)  predniSONE (DELTASONE) tablet 5 mg (not administered)  acetaminophen (TYLENOL) tablet 650 mg (not administered)    Or  acetaminophen (TYLENOL) suppository 650 mg (not administered)  ondansetron (ZOFRAN) tablet 4 mg (not administered)    Or  ondansetron (ZOFRAN) injection 4 mg (not administered)  furosemide (LASIX) injection 40 mg (not administered)  sodium chloride flush (NS) 0.9 % injection 3 mL (3 mLs Intravenous Given 12/21/15 2258)  Warfarin - Pharmacist Dosing Inpatient (not administered)  furosemide (LASIX) injection 60 mg (60 mg Intravenous Given 12/21/15 1858)  warfarin (COUMADIN) tablet 5 mg (5 mg Oral Given 12/21/15 2257)     Initial Impression / Assessment and Plan / ED Course  I have reviewed the triage vital signs and the nursing notes.  Pertinent labs & imaging results that were available during my care of the patient were reviewed by me and considered in my medical decision making (see chart for details).  Clinical Course    Patient presents with several days of symptomatic palpitations and worsening leg swelling. History of heart failure with preserved EF. She is breathing comfortably on room air, has a normal heart rate and is normotensive. EKG with paced rhythm. Pacer interrogated and has no abnormalities. Chest x-ray with pulmonary edema, consistent with bedside echo which revealed grossly reduced EF and dilated IVC consistent with volume overload. BNP mildly elevated compared to baseline. She was given a dose of IV  lasix and will be admitted for management of likely CHF exacerbation. She is stable and appropriate for the floor.  Final Clinical Impressions(s) / ED Diagnoses   Final diagnoses:  Palpitations  New Prescriptions Current Discharge Medication List       Harlin Heys, MD 12/21/15 2333    Fatima Blank, MD 12/22/15 [phone]

## 2015-12-21 NOTE — Telephone Encounter (Signed)
[name] is calling because she has been having palpitations for a few days . Please call

## 2015-12-21 NOTE — Progress Notes (Signed)
ANTICOAGULATION CONSULT NOTE - Initial Consult  Pharmacy Consult for warfarin Indication: mechanical valve  No Known Allergies  Patient Measurements: Height: 5\' 5"  (165.1 cm) Weight: 266 lb 9.6 oz (120.9 kg) (scale c) IBW/kg (Calculated) : 57  Vital Signs: Temp: 98 F (36.7 C) (11/01 2100) Temp Source: Oral (11/01 2100) BP: 132/62 (11/01 2100) Pulse Rate: 71 (11/01 2100)  Labs:  Recent Labs  12/21/15 1447  HGB 11.6*  HCT 36.6  PLT 191  LABPROT 32.2*  INR 3.05  CREATININE 1.40*  TROPONINI <0.03    Estimated Creatinine Clearance: 53.6 mL/min (by C-G formula based on SCr of 1.4 mg/dL (H)).   Medical History: Past Medical History:  Diagnosis Date  . Asthma   . Carotid artery disease ([place])   . Chronic anticoagulation   . Complete heart block ([place]) 1//2015   Implantation of a dual chamber pacemaker on 03-06-2013 by Dr Rayann Heman.  The patient received a STJ model number 7290 pacemaker with model number 2088 right ventricular lead.   [name] Dyslipidemia   . H/O Doppler ultrasound 10/03/2010   carotid duplex - R ICA normal patency; L CCA-ICA bypass gradt patent common to interal cartoid bypass graft   . H/O mechanical aortic valve replacement   . History of echocardiogram 11/08/2011   EF >55%; mild-mod concentric LVH; trace aortic regurg.; LA mild-mod dilated  . History of nuclear stress test 01/20/2010   normal pattern of perfusion; low risk scan  . Hypertension   . Lupus (systemic lupus erythematosus) ([place])   . PVD (peripheral vascular disease) ([facility])    60% R renal artery stenosis; non-functioning L kidney    Assessment: 39 yof on warfarin PTA for mechanical AVR. Pharmacy consulted to continue inpatient. INR therapeutic 3.05 on admit - recently dose-reduced, goal is 2.5-3.5 per outpatient anticoagulation notes. Hg 11.6, plt wnl, no bleed documented. Noted patient was on Cipro PTA.  PTA warfarin dose: 5mg  daily except 7.5mg  on TuTh (last dose 10/31 PTA per med rec)   Goal  of Therapy:  INR 2.5-3.5 Monitor platelets by anticoagulation protocol: Yes   Plan:  Warfarin 5mg  x 1 dose tonight Daily INR Monitor for s/sx bleeding   Elicia Lamp, PharmD, BCPS Clinical Pharmacist 12/21/2015 10:30 PM

## 2015-12-21 NOTE — ED Notes (Signed)
Meal tray at bedside.  

## 2015-12-21 NOTE — ED Notes (Signed)
No complaints

## 2015-12-22 DIAGNOSIS — I5033 Acute on chronic diastolic (congestive) heart failure: Secondary | ICD-10-CM

## 2015-12-22 LAB — BASIC METABOLIC PANEL
Anion gap: 7 (ref 5–15)
BUN: 21 mg/dL — AB (ref 6–20)
CHLORIDE: 107 mmol/L (ref 101–111)
CO2: 26 mmol/L (ref 22–32)
Calcium: 8.7 mg/dL — ABNORMAL LOW (ref 8.9–10.3)
Creatinine, Ser: 1.22 mg/dL — ABNORMAL HIGH (ref 0.44–1.00)
GFR calc Af Amer: 53 mL/min — ABNORMAL LOW (ref >60)
GFR calc non Af Amer: 46 mL/min — ABNORMAL LOW (ref >60)
Glucose, Bld: 99 mg/dL (ref 65–99)
POTASSIUM: 3.6 mmol/L (ref 3.5–5.1)
SODIUM: 140 mmol/L (ref 135–145)

## 2015-12-22 LAB — CBC WITH DIFFERENTIAL/PLATELET
Basophils Absolute: 0 10*3/uL (ref 0.0–0.1)
Basophils Relative: 1 %
EOS PCT: 4 %
Eosinophils Absolute: 0.2 10*3/uL (ref 0.0–0.7)
HCT: 34.5 % — ABNORMAL LOW (ref 36.0–46.0)
HEMOGLOBIN: 10.9 g/dL — AB (ref 12.0–15.0)
LYMPHS ABS: 1.1 10*3/uL (ref 0.7–4.0)
LYMPHS PCT: 20 %
MCH: 28.1 pg (ref 26.0–34.0)
MCHC: 31.6 g/dL (ref 30.0–36.0)
MCV: 88.9 fL (ref 78.0–100.0)
Monocytes Absolute: 0.7 10*3/uL (ref 0.1–1.0)
Monocytes Relative: 12 %
Neutro Abs: 3.7 10*3/uL (ref 1.7–7.7)
Neutrophils Relative %: 65 %
Platelets: 194 10*3/uL (ref 150–400)
RBC: 3.88 MIL/uL (ref 3.87–5.11)
RDW: 16.5 % — ABNORMAL HIGH (ref 11.5–15.5)
WBC: 5.7 10*3/uL (ref 4.0–10.5)

## 2015-12-22 LAB — PROTIME-INR
INR: 3.01
Prothrombin Time: 31.9 seconds — ABNORMAL HIGH (ref 11.4–15.2)

## 2015-12-22 LAB — TROPONIN I
Troponin I: 0.04 ng/mL (ref <0.03)
Troponin I: <0.03 ng/mL (ref <0.03)

## 2015-12-22 MED ORDER — WARFARIN SODIUM 7.5 MG PO TABS
7.5000 mg | ORAL_TABLET | Freq: Once | ORAL | Status: DC
Start: 2015-12-22 — End: 2015-12-22

## 2015-12-22 MED ORDER — FUROSEMIDE 40 MG PO TABS: 80.0000 mg | ORAL_TABLET | Freq: Every day | ORAL | Status: DC

## 2015-12-22 NOTE — Evaluation (Signed)
Physical Therapy Evaluation Patient Details Name: [name] MRN: [id number] DOB: 01/16/1951 Today's Date: 12/22/2015   History of Present Illness  65 y.o. female with mechanical aortic valve replacement on Coumadin, hypertension, complete heart block status post pacemaker placement, asthma, diastolic CHF presents to the ER because of palpitations. Patient states patient has been getting episodes of palpitation off and on last few days last for a few minutes each time. Patient also does experienced shortness of breath with increasing lower extremity edema.  Clinical Impression  Pt presents at baseline level of functioning, independent with mobility, mod I with gait due to decreased cadence. Pt with no further PT needs identified at this time.    Follow Up Recommendations No PT follow up    Equipment Recommendations  None recommended by PT    Recommendations for Other Services       Precautions / Restrictions Precautions Precautions: None Restrictions Weight Bearing Restrictions: No      Mobility  Bed Mobility Overal bed mobility: Independent                Transfers Overall transfer level: Independent                  Ambulation/Gait Ambulation/Gait assistance: Modified independent (Device/Increase time) Ambulation Distance (Feet): 200 Feet Assistive device: None       General Gait Details: widened BOS, decreased cadence, no LOB spO2 97% on room air after gait  Stairs            Wheelchair Mobility    Modified Rankin (Stroke Patients Only)       Balance                                             Pertinent Vitals/Pain Pain Assessment: No/denies pain    Home Living Family/patient expects to be discharged to:: Private residence Living Arrangements: Alone Available Help at Discharge: Family;Available PRN/intermittently Type of Home: Apartment Home Access: Stairs to enter   Entrance Stairs-Number of Steps: 1 Home  Layout: One level        Prior Function Level of Independence: Independent               Hand Dominance        Extremity/Trunk Assessment   Upper Extremity Assessment: Overall WFL for tasks assessed           Lower Extremity Assessment: Overall WFL for tasks assessed      Cervical / Trunk Assessment: Normal  Communication   Communication: No difficulties  Cognition Arousal/Alertness: Awake/alert Behavior During Therapy: WFL for tasks assessed/performed Overall Cognitive Status: Within Functional Limits for tasks assessed                      General Comments      Exercises     Assessment/Plan    PT Assessment Patent does not need any further PT services  PT Problem List            PT Treatment Interventions      PT Goals (Current goals can be found in the Care Plan section)  Acute Rehab PT Goals PT Goal Formulation: All assessment and education complete, DC therapy    Frequency     Barriers to discharge        Co-evaluation  End of Session   Activity Tolerance: Patient tolerated treatment well Patient left: in bed;with call bell/phone within reach      Functional Assessment Tool Used: clincial judgement Functional Limitation: Mobility: Walking and moving around Mobility: Walking and Moving Around Current Status ([id number]): At least 1 percent but less than 20 percent impaired, limited or restricted Mobility: Walking and Moving Around Goal Status [phone]): At least 1 percent but less than 20 percent impaired, limited or restricted Mobility: Walking and Moving Around Discharge Status [phone]): At least 1 percent but less than 20 percent impaired, limited or restricted    Time: 1125-1135 PT Time Calculation (min) (ACUTE ONLY): 10 min   Charges:   PT Evaluation $PT Eval Low Complexity: 1 Procedure     PT G Codes:   PT G-Codes **NOT FOR INPATIENT CLASS** Functional Assessment Tool Used: clincial  judgement Functional Limitation: Mobility: Walking and moving around Mobility: Walking and Moving Around Current Status ([id number]): At least 1 percent but less than 20 percent impaired, limited or restricted Mobility: Walking and Moving Around Goal Status [phone]): At least 1 percent but less than 20 percent impaired, limited or restricted Mobility: Walking and Moving Around Discharge Status [phone]): At least 1 percent but less than 20 percent impaired, limited or restricted    [facility] 12/22/2015, 11:33 AM

## 2015-12-22 NOTE — Discharge Summary (Signed)
Physician Discharge Summary  [name] NUU:[id number] DOB: [birth year] DOA: 12/21/2015  PCP: [name], MD  Admit date: 12/21/2015 Discharge date: 12/22/2015  Admitted From: Home Disposition: Home   Recommendations for Outpatient Follow-up:  1. Follow up with cardiology.  2. Monitor renal function and volume status. Lasix increased from 40mg  daily with additional prn dose in PM, to lasix 80mg  daily until follow up. 3. Consider echocardiography. This was not performed while in the hospital.  Home Health: None recommended Equipment/Devices: None  Discharge Condition: Stable CODE STATUS: Full Diet recommendation: Heart healthy  Brief/Interim Summary: [name] is a 65 y.o. female with mechanical aortic valve replacement on Coumadin, hypertension, complete heart block s/p pacemaker placement, asthma, diastolic CHF presented to the ER because of palpitations and lower extremity edema. She has chronic lower extremity edema that has worsened despite taking 40mg  additional dose of lasix daily. She experienced shortness of breath without chest pain. In the ED, her pacemaker was interrogated which did not show anything abnormal per report. as per the ER physician. CXR showed mild congestion and BNP was 490. Case was discussed with on-call cardiologist who recommended admission for observation. Troponins were negative, and urine output was significant with IV lasix therapy with improvement in creatinine 1.4 > 1.22. Dyspnea resolved and LE edema improved.   Discharge Diagnoses:  Principal Problem:   Acute on chronic diastolic CHF (congestive heart failure) (HCC) Active Problems:   S/P AVR (aortic valve replacement)   Hyperlipidemia   Essential hypertension   Complete heart block (HCC)   Cardiac pacemaker in situ - [employer]   Palpitations   CHF (congestive heart failure) (HCC)  Acute on chronic diastolic CHF: Echo Oct 3474 with indeterminate filling pressures, EF 55-60%. - Creatinine  improved with > 2.5L UOP overnight. Symptoms resolved.  - Suspect lasix 40mg  po is no longer effective, so will discharge on 80mg  po daily dose pending follow up with cardiology.  - Due for annual echocardiogram (s/p mechanical AVR), which was not performed during this admission.  - LE edema also with component of venous stasis, recommend compression stockings.  Palpitations: TSH wnl, no telemetry events.  Discharge Instructions Discharge Instructions    (HEART FAILURE PATIENTS) Call MD:  Anytime you have any of the following symptoms: 1) 3 pound weight gain in 24 hours or 5 pounds in 1 week 2) shortness of breath, with or without a dry hacking cough 3) swelling in the hands, feet or stomach 4) if you have to sleep on extra pillows at night in order to breathe.    Complete by:  As directed    Diet - low sodium heart healthy    Complete by:  As directed    Discharge instructions    Complete by:  As directed    You were admitted for volume overload thought to be due to acute exacerbation of chronic heart failure. It is possible that the dose of 40mg  lasix is inadequate. You have responded well to IV lasix and can be discharged with the following recommendations:  - START taking lasix 80mg  daily (take 2 tablets of 40mg  each) until you follow up with cardiology. This has been scheduled.  - Compression stockings will likely help your leg swelling. You can ask your cardiologist about this and/or go to [facility] for fitting.       Medication List    STOP taking these medications   ciprofloxacin 500 MG tablet Commonly known as:  CIPRO  TAKE these medications   acetaminophen 325 MG tablet Commonly known as:  TYLENOL Take 650 mg by mouth every 6 (six) hours as needed for moderate pain.   ADVAIR DISKUS 250-50 MCG/DOSE Aepb Generic drug:  Fluticasone-Salmeterol Use 1 inhalation by mouth  two times daily   allopurinol 100 MG tablet Commonly known as:  ZYLOPRIM Take 1 tablet  by mouth daily.   CALTRATE 600 1500 (600 Ca) MG Tabs tablet Generic drug:  calcium carbonate Take 1 tablet by mouth 2 (two) times daily.   chlorhexidine 0.12 % solution Commonly known as:  PERIDEX USES ONCE EVERY FEW WEEKS IN WATER PIK   cholecalciferol 1000 units tablet Commonly known as:  VITAMIN D Take 2,000 Units by mouth daily.   furosemide 40 MG tablet Commonly known as:  LASIX Take 2 tablets (80 mg total) by mouth daily. What changed:  how much to take   hydroxychloroquine 200 MG tablet Commonly known as:  PLAQUENIL Take 1 tablet (200 mg total) by mouth 2 (two) times daily.   isosorbide mononitrate 30 MG 24 hr tablet Commonly known as:  IMDUR TAKE 1 TABLET(30 MG) BY MOUTH DAILY   loratadine 10 MG tablet Commonly known as:  CLARITIN Take 10 mg by mouth daily.   metoprolol succinate 25 MG 24 hr tablet Commonly known as:  TOPROL XL Take 1 tablet (25 mg total) by mouth daily.   predniSONE 5 MG tablet Commonly known as:  DELTASONE Take 5 mg by mouth daily.   PROBIOTIC DAILY PO Take 1 tablet by mouth daily.   simvastatin 20 MG tablet Commonly known as:  ZOCOR Take 1 tablet by mouth at  bedtime   warfarin 5 MG tablet Commonly known as:  COUMADIN Take 1 to 1 and 1/2 tablets by mouth daily as directed  by coumadin clinic What changed:  See the new instructions.      Follow-up Information    Quay Burow, MD. Go on 01/02/2016.   Specialties:  Cardiology, Radiology Why:  @10 :30am with PA [facility] information: [address] [place] [address] [phone]          No Known Allergies  Consultations:  None  Procedures/Studies: Dg Chest Port 1 View  Result Date: 12/21/2015 CLINICAL DATA:  Irregular heart rate EXAM: PORTABLE CHEST 1 VIEW COMPARISON:  04/22/2015 FINDINGS: Surgical clips in the left neck. Left-sided pacemaker device with leads overlying right atrium and right ventricle. Post sternotomy changes. There is moderate  cardiomegaly. Mild central vascular congestion. Mild perihilar interstitial opacities suggest mild edema. No focal consolidation. No effusion. No pneumothorax. IMPRESSION: 1. Stable moderate cardiomegaly with mild central congestion. Mild perihilar interstitial opacities suggest mild edema. 2. No focal consolidation or effusion. Electronically Signed   By: Donavan Foil M.D.   On: 12/21/2015 16:31     Subjective: Pt feels much better, no dyspnea or palpitations overnight. No chest pain, LE edema at baseline.   Discharge Exam: Vitals:   12/21/15 2354 12/22/15 0501  BP: (!) 127/46 (!) 127/54  Pulse: 73 69  Resp: 18 16  Temp: 97.8 F (36.6 C) 98.3 F (36.8 C)   Vitals:   12/21/15 2100 12/21/15 2354 12/22/15 0501 12/22/15 0928  BP: 132/62 (!) 127/46 (!) 127/54   Pulse: 71 73 69   Resp: 18 18 16    Temp: 98 F (36.7 C) 97.8 F (36.6 C) 98.3 F (36.8 C)   TempSrc: Oral Oral Oral   SpO2: 100% 100% 100% 98%  Weight: 120.9 kg (  266 lb 9.6 oz)  119.3 kg (262 lb 14.4 oz)   Height: 5\' 5"  (1.651 m)      General: Obese, well-appearing female in no distress Cardiovascular: RRR, G3/O7 with crisp click, no rubs, no gallops Respiratory: CTA bilaterally, no wheezing, no rhonchi Abdominal: Soft, NT, ND, bowel sounds + Extremities: 1+ pitting L > R nontender bilateral LE edema  The results of significant diagnostics from this hospitalization (including imaging, microbiology, ancillary and laboratory) are listed below for reference.    Microbiology: No results found for this or any previous visit (from the past 240 hour(s)).   Labs: BNP (last 3 results)  Recent Labs  01/27/15 1713 04/13/15 1230 12/21/15 1447  BNP 661.0* 202.9* 564.3*   Basic Metabolic Panel:  Recent Labs Lab 12/21/15 1447 12/21/15 2235 12/22/15 0429  NA 139  --  140  K 3.9  --  3.6  CL 105  --  107  CO2 26  --  26  GLUCOSE 116*  --  99  BUN 26*  --  21*  CREATININE 1.40*  --  1.22*  CALCIUM 9.2  --  8.7*   MG  --  1.9  --    Liver Function Tests: No results for input(s): AST, ALT, ALKPHOS, BILITOT, PROT, ALBUMIN in the last 168 hours. No results for input(s): LIPASE, AMYLASE in the last 168 hours. No results for input(s): AMMONIA in the last 168 hours. CBC:  Recent Labs Lab 12/21/15 1447 12/22/15 0429  WBC 5.6 5.7  NEUTROABS  --  3.7  HGB 11.6* 10.9*  HCT 36.6 34.5*  MCV 89.5 88.9  PLT 191 194   Cardiac Enzymes:  Recent Labs Lab 12/21/15 1447 12/21/15 2235 12/22/15 0429 12/22/15 1021  TROPONINI <0.03 <0.03 0.04* <0.03   BNP: Invalid input(s): POCBNP CBG: No results for input(s): GLUCAP in the last 168 hours. D-Dimer No results for input(s): DDIMER in the last 72 hours. Hgb A1c No results for input(s): HGBA1C in the last 72 hours. Lipid Profile No results for input(s): CHOL, HDL, LDLCALC, TRIG, CHOLHDL, LDLDIRECT in the last 72 hours. Thyroid function studies  Recent Labs  12/21/15 2235  TSH 3.022   Anemia work up No results for input(s): VITAMINB12, FOLATE, FERRITIN, TIBC, IRON, RETICCTPCT in the last 72 hours. Urinalysis    Component Value Date/Time   COLORURINE YELLOW 03/08/2013 1212   APPEARANCEUR CLEAR 03/08/2013 1212   LABSPEC 1.030 03/08/2013 1212   PHURINE 5.0 03/08/2013 1212   GLUCOSEU NEGATIVE 03/08/2013 1212   HGBUR NEGATIVE 03/08/2013 1212   BILIRUBINUR NEGATIVE 03/08/2013 1212   KETONESUR NEGATIVE 03/08/2013 1212   PROTEINUR 30 (A) 03/08/2013 1212   UROBILINOGEN 0.2 03/08/2013 1212   NITRITE NEGATIVE 03/08/2013 1212   LEUKOCYTESUR NEGATIVE 03/08/2013 1212   Sepsis Labs Invalid input(s): PROCALCITONIN,  WBC,  LACTICIDVEN Microbiology No results found for this or any previous visit (from the past 240 hour(s)).  Time coordinating discharge: Over 30 minutes  Vance Gather, MD  [facility] 12/22/2015, 12:40 PM Pager [phone]  If 7PM-7AM, please contact night-coverage [email or web address] Password [email or web address]

## 2015-12-22 NOTE — Progress Notes (Signed)
Nutrition Education Note  RD consulted for nutrition education.   RD familiar with patient from previous admission at which time pt demonstrated knowledge of low sodium diet. Pt states that she reads all nutrition labels and buys all low sodium products; she eats lots of fresh fruits and vegetables and rarely eats out. She denies any additional education needs at this time. RD encouraged pt to continue with her intake of fresh fruits and vegetables. In further discussion, pt mentioned drinking lots of water.   RD discussed the role of fluids and reviewed current 1.2 L fluid restriction which is about 5 cups of fluid. Pt reports drinking 6 to 8 cups of fluid most days sometimes up to 12 cups. Teach back method used.  Expect good compliance.  Body mass index is 43.75 kg/m. Pt meets criteria for Morbid Obesity based on current BMI.  Current diet order is Low Sodium/Heart Healthy Diet, patient is consuming approximately 75-100% of meals at this time. Labs and medications reviewed. No further nutrition interventions warranted at this time. RD contact information provided. If additional nutrition issues arise, please re-consult RD.   Scarlette Ar RD, CSP, LDN Inpatient Clinical Dietitian Pager: [phone] After Hours Pager: [phone]

## 2015-12-22 NOTE — Telephone Encounter (Addendum)
Patient is still admitted to hospital 12/22/15  patient went to hospital for palpitation on 12/21/15  need try again tomorrow - TCM  Call 12/23/15

## 2015-12-22 NOTE — Telephone Encounter (Signed)
Follow up      Not sure if this is a TCM appt, but the nurse called to schedule a 7-10 day hosp follow up.  Appt made on 01-02-16 with [name]

## 2015-12-22 NOTE — Progress Notes (Signed)
ANTICOAGULATION CONSULT NOTE - Initial Consult  Pharmacy Consult for warfarin Indication: mechanical valve  No Known Allergies  Patient Measurements: Height: 5\' 5"  (165.1 cm) Weight: 262 lb 14.4 oz (119.3 kg) (scale c) IBW/kg (Calculated) : 57  Vital Signs: Temp: 98.3 F (36.8 C) (11/02 0501) Temp Source: Oral (11/02 0501) BP: 127/54 (11/02 0501) Pulse Rate: 69 (11/02 0501)  Labs:  Recent Labs  12/21/15 1447 12/21/15 2235 12/22/15 0429  HGB 11.6*  --  10.9*  HCT 36.6  --  34.5*  PLT 191  --  194  LABPROT 32.2*  --  31.9*  INR 3.05  --  3.01  CREATININE 1.40*  --  1.22*  TROPONINI <0.03 <0.03 0.04*    Estimated Creatinine Clearance: 61 mL/min (by C-G formula based on SCr of 1.22 mg/dL (H)).   Medical History: Past Medical History:  Diagnosis Date  . Asthma   . Carotid artery disease ([place])   . Chronic anticoagulation   . Complete heart block ([place]) 1//2015   Implantation of a dual chamber pacemaker on 03-06-2013 by Dr Rayann Heman.  The patient received a STJ model number 6256 pacemaker with model number 2088 right ventricular lead.   [name] Dyslipidemia   . H/O Doppler ultrasound 10/03/2010   carotid duplex - R ICA normal patency; L CCA-ICA bypass gradt patent common to interal cartoid bypass graft   . H/O mechanical aortic valve replacement   . History of echocardiogram 11/08/2011   EF >55%; mild-mod concentric LVH; trace aortic regurg.; LA mild-mod dilated  . History of nuclear stress test 01/20/2010   normal pattern of perfusion; low risk scan  . Hypertension   . Lupus (systemic lupus erythematosus) ([place])   . PVD (peripheral vascular disease) (HCC)    60% R renal artery stenosis; non-functioning L kidney    Assessment: 57 yof on Coumadin 5mg  daily exc for 7.5mg  on Tues/Thurs PTA for mechanical AVR. Goal INR of 2.5-3.5. INR today is therapeutic at 3.01. Hgb 10.9, plts wnl. No s/s of bleed.  Goal of Therapy:  INR 2.5-3.5 Monitor platelets by anticoagulation protocol:  Yes   Plan:  Give Coumadin 7.5mg  PO x 1 tonight Monitor daily INR, CBC, s/s of bleed  Elenor Quinones, PharmD, [facility] Clinical Pharmacist Pager [phone] 12/22/2015 11:04 AM

## 2015-12-22 NOTE — Progress Notes (Signed)
Pt has orders to be discharged. Discharge instructions given and pt has no additional questions at this time. Medication regimen reviewed and pt educated. Pt verbalized understanding and has no additional questions. Telemetry box removed. IV removed and site in good condition. Pt stable and waiting for transportation.  Lakendra Helling RN 

## 2015-12-23 ENCOUNTER — Ambulatory Visit (INDEPENDENT_AMBULATORY_CARE_PROVIDER_SITE_OTHER): Payer: Medicare Other | Admitting: Pharmacist

## 2015-12-23 DIAGNOSIS — Z5181 Encounter for therapeutic drug level monitoring: Secondary | ICD-10-CM

## 2015-12-23 DIAGNOSIS — Z952 Presence of prosthetic heart valve: Secondary | ICD-10-CM

## 2015-12-23 NOTE — Telephone Encounter (Signed)
Patient contacted regarding discharge from [facility] 12/22/15.  Patient understands to follow up with provider [facility] on 01/02/16 at {10:30 AM at [facility]. Patient understands discharge instructions? yes  Patient understands medications and regiment? yes  Patient understands to bring all medications to this visit? yes

## 2015-12-27 ENCOUNTER — Encounter: Payer: Self-pay | Admitting: Physician Assistant

## 2016-01-02 ENCOUNTER — Encounter: Payer: Self-pay | Admitting: Student

## 2016-01-02 ENCOUNTER — Ambulatory Visit (INDEPENDENT_AMBULATORY_CARE_PROVIDER_SITE_OTHER): Payer: Medicare Other | Admitting: Student

## 2016-01-02 VITALS — BP 134/76 | HR 71 | Ht 65.0 in | Wt 259.6 lb

## 2016-01-02 DIAGNOSIS — I5032 Chronic diastolic (congestive) heart failure: Secondary | ICD-10-CM | POA: Diagnosis not present

## 2016-01-02 DIAGNOSIS — R002 Palpitations: Secondary | ICD-10-CM

## 2016-01-02 DIAGNOSIS — I10 Essential (primary) hypertension: Secondary | ICD-10-CM

## 2016-01-02 DIAGNOSIS — Z950 Presence of cardiac pacemaker: Secondary | ICD-10-CM

## 2016-01-02 DIAGNOSIS — Z952 Presence of prosthetic heart valve: Secondary | ICD-10-CM | POA: Diagnosis not present

## 2016-01-02 DIAGNOSIS — I472 Ventricular tachycardia: Secondary | ICD-10-CM | POA: Diagnosis not present

## 2016-01-02 DIAGNOSIS — I4729 Other ventricular tachycardia: Secondary | ICD-10-CM

## 2016-01-02 LAB — BASIC METABOLIC PANEL
BUN: 33 mg/dL — ABNORMAL HIGH (ref 7–25)
CO2: 34 mmol/L — ABNORMAL HIGH (ref 20–31)
Calcium: 9.7 mg/dL (ref 8.6–10.4)
Chloride: 105 mmol/L (ref 98–110)
Creat: 1.29 mg/dL — ABNORMAL HIGH (ref 0.50–0.99)
GLUCOSE: 86 mg/dL (ref 65–99)
POTASSIUM: 3.9 mmol/L (ref 3.5–5.3)
SODIUM: 146 mmol/L (ref 135–146)

## 2016-01-02 MED ORDER — FUROSEMIDE 40 MG PO TABS: 40.0000 mg | ORAL_TABLET | Freq: Every day | ORAL | Status: DC

## 2016-01-02 MED ORDER — METOPROLOL SUCCINATE ER 50 MG PO TB24: 50.0000 mg | ORAL_TABLET | Freq: Every day | ORAL | 1 refills | Status: DC

## 2016-01-02 MED ORDER — METOPROLOL SUCCINATE ER 50 MG PO TB24: 50.0000 mg | ORAL_TABLET | Freq: Every day | ORAL | 3 refills | Status: DC

## 2016-01-02 NOTE — Patient Instructions (Addendum)
Medication Instructions:  INCREASE Toprolol XL to 50mg  Take 1 tab once a day  DECREASE Lasix 40mg  Take 1 tab by mouth daily  Labwork: Your physician recommends that you return for lab work in: TODAY-BMET  Testing/Procedures: None   Follow-Up: Your physician recommends that you schedule a follow-up appointment in: [place].  Your physician recommends that you schedule a follow-up appointment in: SCHEDULE A PACER CHECK WITH DR Rayann Heman FIRST AVAILABLE  Any Other Special Instructions Will Be Listed Below (If Applicable).     If you need a refill on your cardiac medications before your next appointment, please call your pharmacy.

## 2016-01-02 NOTE — Progress Notes (Signed)
Cardiology Office Note    Date:  01/02/2016   ID:  DRISANA SCHWEICKERT, DOB 03/16/1950, MRN [id number]  PCP:  [name], MD  Cardiologist: Dr. Gwenlyn Found Electrophysiologist: Dr. Rayann Heman  Chief Complaint  Patient presents with  . Follow-up    History of Present Illness:    [name] is a 65 y.o. female with past medical history of severe aortic insufficiency (s/p St. Jude AVR in 2006, on Coumadin), CHB (s/p PPM 02/2013), chronic diastolic CHF (EF 72-53% by echo in 11/2014), HTN, HLD, carotid stenosis (s/p L CEA), HTN, HLD and normal cors by cath in 03/2015 who presents to the office today for hospital follow-up.   She was recently admitted from 11/1 - 12/22/2015 for acute on chronic diastolic CHF. She had presented with worsening palpitations and lower extremity edema. BNP was elevated to 490 and CXR showed stable cardiomegaly with mild central congestion and edema. Creatinine was elevated to 1.40 but improved to 1.22 at the time of discharge. PTA Lasix dosing of 40mg  daily was increased to 80mg  daily.   Today, she reports her breathing has significantly improved. In describing the symptoms she experienced on the day of her recent hospital admission, she had an episode of palpitations lasting 20-30 minutes. Was unable to check her pulse at that time but says she felt like her "heart was pounding out of her chest". Reports associated flushing and dyspnea. Denies any episodes of chest pain, diaphoresis, nausea, vomiting, or presyncope. She denies any repeat episodes while being admitted.   Since hospital discharge, she had been taking Lasix 80mg  daily. Weight is close to baseline (258-259 lbs), granted she was up to 265 lbs in 11/2015. Denies any orthopnea or PND. She is still experiencing palpitations a few times per week. Denies any associated symptoms and says these last for about 5 minutes. Not precipitated by any events. Denies any excessive caffeine intake. Does not consume coffee, tea, or  alcohol.     Past Medical History:  Diagnosis Date  . Asthma   . CAD (coronary artery disease)    a. normal cors by cath in 03/2015  . Carotid artery disease ([place])   . Chronic anticoagulation   . Complete heart block ([place]) 02/2013   a. Implantation of a dual chamber pacemaker in 02/2013 by Dr Rayann Heman. STJ model number 6644 pacemaker with model number [id number] right ventricular lead.   [name] Dyslipidemia   . H/O Doppler ultrasound 10/03/2010   carotid duplex - R ICA normal patency; L CCA-ICA bypass gradt patent common to interal cartoid bypass graft   . H/O mechanical aortic valve replacement   . History of echocardiogram 11/08/2011   EF >55%; mild-mod concentric LVH; trace aortic regurg.; LA mild-mod dilated  . History of nuclear stress test 01/20/2010   normal pattern of perfusion; low risk scan  . Hypertension   . Lupus (systemic lupus erythematosus) ([place])   . PVD (peripheral vascular disease) (H[facility])    60% R renal artery stenosis; non-functioning L kidney    Past Surgical History:  Procedure Laterality Date  . AORTIC VALVE REPLACEMENT  11/29/006   Gerhardt; [place]; on coumadin  . CARDIAC CATHETERIZATION  11/01/2004   define anatomy   . CARDIAC CATHETERIZATION N/A 04/19/2015   Procedure: Coronary Angiography;  Surgeon: Peter M [name], MD;  Location: [facility];  Service: Cardiovascular;  Laterality: N/A;  . CAROTID ENDARTERECTOMY     lt.  . CHOLECYSTECTOMY  1999  . EXPLORATORY LAPAROTOMY  04/12/2012  small bowel perf; ileostomy & R hemicolectomy   . PACEMAKER INSERTION  03/06/2013   SJM Assurity DR PPM implanted by Dr Rayann Heman for complete heart block  . PERMANENT PACEMAKER INSERTION N/A 03/06/2013   Procedure: PERMANENT PACEMAKER INSERTION;  Surgeon: Coralyn Mark, MD;  Location: [facility];  Service: Cardiovascular;  Laterality: N/A;  . TEMPORARY PACEMAKER INSERTION N/A 03/06/2013   Procedure: TEMPORARY PACEMAKER INSERTION;  Surgeon: Coralyn Mark, MD;  Location: [facility]  [facility];  Service: Cardiovascular;  Laterality: N/A;  . TUBAL LIGATION  11/1982    Current Medications: Outpatient Medications Prior to Visit  Medication Sig Dispense Refill  . acetaminophen (TYLENOL) 325 MG tablet Take 650 mg by mouth every 6 (six) hours as needed for moderate pain.     [name] ADVAIR DISKUS 250-50 MCG/DOSE AEPB Use 1 inhalation by mouth  two times daily 180 each 0  . allopurinol (ZYLOPRIM) 100 MG tablet Take 1 tablet by mouth daily.  3  . Calcium Carbonate (CALTRATE 600) 1500 MG TABS Take 1 tablet by mouth 2 (two) times daily.      . chlorhexidine (PERIDEX) 0.12 % solution USES ONCE EVERY FEW WEEKS IN WATER PIK  5  . cholecalciferol (VITAMIN D) 1000 UNITS tablet Take 2,000 Units by mouth daily.     . hydroxychloroquine (PLAQUENIL) 200 MG tablet Take 1 tablet (200 mg total) by mouth 2 (two) times daily. 60 tablet 11  . isosorbide mononitrate (IMDUR) 30 MG 24 hr tablet TAKE 1 TABLET(30 MG) BY MOUTH DAILY 30 tablet 11  . loratadine (CLARITIN) 10 MG tablet Take 10 mg by mouth daily.    . predniSONE (DELTASONE) 5 MG tablet Take 5 mg by mouth daily.      . Probiotic Product (PROBIOTIC DAILY PO) Take 1 tablet by mouth daily.    . simvastatin (ZOCOR) 20 MG tablet Take 1 tablet by mouth at  bedtime 90 tablet 0  . warfarin (COUMADIN) 5 MG tablet Take 1 to 1 and 1/2 tablets by mouth daily as directed  by coumadin clinic (Patient taking differently: TAKES 7.5MG  ON TUES AND THURS, TAKES 5MG  ALL OTHER DAYS) 75 tablet 5  . furosemide (LASIX) 40 MG tablet Take 2 tablets (80 mg total) by mouth daily.    . metoprolol succinate (TOPROL XL) 25 MG 24 hr tablet Take 1 tablet (25 mg total) by mouth daily. 90 tablet 3   No facility-administered medications prior to visit.      Allergies:   Patient has no known allergies.   Social History   Social History  . Marital status: Divorced    Spouse name: N/A  . Number of children: 3  . Years of education: N/A   Occupational History  . Retired     Social History Main Topics  . Smoking status: Never Smoker  . Smokeless tobacco: Never Used  . Alcohol use No  . Drug use: No  . Sexual activity: Not Asked   Other Topics Concern  . None   Social History Narrative   Lives alone in [place].     Family History:  The patient's family history includes Alzheimer's disease in her father; Cancer in her father; Cirrhosis in her father.   Review of Systems:   Please see the history of present illness.     General:  No chills, fever, night sweats or weight changes.  Cardiovascular:  No chest pain, dyspnea on exertion, orthopnea, paroxysmal nocturnal dyspnea. Positive for lower extremity edema and palpitations.  Dermatological: No rash, lesions/masses Respiratory: No cough, dyspnea Urologic: No hematuria, dysuria Abdominal:   No nausea, vomiting, diarrhea, bright red blood per rectum, melena, or hematemesis Neurologic:  No visual changes, wkns, changes in mental status. All other systems reviewed and are otherwise negative except as noted above.   Physical Exam:    VS:  BP 134/76   Pulse 71   Ht 5\' 5"  (1.651 m)   Wt 259 lb 9.6 oz (117.8 kg)   SpO2 100%   BMI 43.20 kg/m    General: Well developed, well nourished,female appearing in no acute distress. Head: Normocephalic, atraumatic, sclera non-icteric, no xanthomas, nares are without discharge.  Neck: No carotid bruits. JVD not elevated.  Lungs: Respirations regular and unlabored, without wheezes or rales.  Heart: Regular rate and rhythm. No S3 or S4.  No murmur, no rubs, or gallops appreciated. Crisp valve sounds appreciated. Abdomen: Soft, non-tender, non-distended with normoactive bowel sounds. No hepatomegaly. No rebound/guarding. No obvious abdominal masses. Msk:  Strength and tone appear normal for age. No joint deformities or effusions. Extremities: No clubbing or cyanosis. Trace lower extremity edema.  Distal pedal pulses are 2+ bilaterally. Neuro: Alert and  oriented X 3. Moves all extremities spontaneously. No focal deficits noted. Psych:  Responds to questions appropriately with a normal affect. Skin: No rashes or lesions noted  Wt Readings from Last 3 Encounters:  01/02/16 259 lb 9.6 oz (117.8 kg)  12/22/15 262 lb 14.4 oz (119.3 kg)  12/05/15 265 lb (120.2 kg)        Studies/Labs Reviewed:   EKG:  EKG is not ordered today.    Recent Labs: 04/13/2015: ALT 30 12/21/2015: B Natriuretic Peptide 490.2; Magnesium 1.9; TSH 3.022 12/22/2015: BUN 21; Creatinine, Ser 1.22; Hemoglobin 10.9; Platelets 194; Potassium 3.6; Sodium 140   Lipid Panel    Component Value Date/Time   CHOL 157 06/02/2014 1035   TRIG 78 06/02/2014 1035   HDL 101 06/02/2014 1035   CHOLHDL 2.1 09/02/2012 0000   LDLCALC 40 06/02/2014 1035    Additional studies/ records that were reviewed today include:  Cardiac Cath: 03/2015 - Normal Coronary Arteries. Device Check: 03/2015 - 28 beats NSVT at 177 bpm  Assessment:    1. Chronic diastolic heart failure (HCC)   2. Palpitations   3. NSVT (nonsustained ventricular tachycardia) ([place])   4. S/P AVR (aortic valve replacement)   5. Cardiac pacemaker in situ - St Jude   6. Essential hypertension      Plan:   In order of problems listed above:  1. Chronic Diastolic CHF - history of preserved EF 50-55% by echo in 11/2014. She was admitted from 11/1 - 12/22/2015 for acute on chronic diastolic CHF. BNP elevated to 490 and CXR consistent with CHF.  - she is back at her baseline weight of 259 lbs.Denies any orthopnea, PND, or worsening edema.  - Will decrease Lasix dosing from 80mg  daily to PTA dosing of 40mg  daily. Check BMET to assess creatinine and K+ levels.   2. Palpitations/ History of NSVT - reports episodes of palpitations occurring at random. Denies any precipitating factors. Can last for seconds to minutes. Recent episode on 11/1 lasted 20 minutes. Denies any prodromal symptoms. Does experience associated  flushing and dyspnea. - denies any episodes of palpitations during today's visit and heart is RRR by physical examination. - most recent device check in 03/2015 showed 28 beats of NSVT. The symptoms [name] are describing today seem most consistent with pSVT, granted there  is concern for PAF (already on Coumadin) or longer episodes of NSVT. Will arrange for device to be interrogated.  - increase Toprol-XL from 25mg  daily to 50mg  daily.    3. Severe Aortic Insufficiency - s/p St. Jude AVR in 2006 - INR goal of 2.5-3.5. Followed by Coumadin Clinic with recent check on 11/3 showing INR of 3.01.  4. Complete Heart Block - s/p PPM placement in 02/2013. Last remote device check was in 03/2015. Will schedule pacer check with Dr. Rayann Heman as she is overdue for this.  5. HTN - BP well-controlled at 134/74 today.  - continue current medication regimen with adjustments as above.  Medication Adjustments/Labs and Tests Ordered: Current medicines are reviewed at length with the patient today.  Concerns regarding medicines are outlined above.  Medication changes, Labs and Tests ordered today are listed in the Patient Instructions below. Patient Instructions  Medication Instructions:  INCREASE Toprol XL to 50mg  Take 1 tab once a day  DECREASE Lasix 40mg  Take 1 tab by mouth daily  Labwork: Your physician recommends that you return for lab work in: TODAY-BMET  Testing/Procedures: None   Follow-Up: Your physician recommends that you schedule a follow-up appointment in: [place].  Your physician recommends that you schedule a follow-up appointment in: [place]  Any Other Special Instructions Will Be Listed Below (If Applicable).  If you need a refill on your cardiac medications before your next appointment, please call your pharmacy.   Signed, Erma Heritage, PA  01/02/2016 3:50 PM    [facility] [place], [place], [place]  [address] Phone: [phone]; Fax: [phone]  [address], [place], [place] [address] Phone: [phone]

## 2016-01-11 ENCOUNTER — Encounter: Payer: Medicare Other | Admitting: Internal Medicine

## 2016-01-16 ENCOUNTER — Ambulatory Visit (INDEPENDENT_AMBULATORY_CARE_PROVIDER_SITE_OTHER): Payer: Medicare Other | Admitting: Internal Medicine

## 2016-01-16 ENCOUNTER — Encounter: Payer: Self-pay | Admitting: Internal Medicine

## 2016-01-16 VITALS — BP 120/82 | HR 74 | Ht 65.0 in | Wt 263.6 lb

## 2016-01-16 DIAGNOSIS — R002 Palpitations: Secondary | ICD-10-CM

## 2016-01-16 DIAGNOSIS — I5032 Chronic diastolic (congestive) heart failure: Secondary | ICD-10-CM

## 2016-01-16 DIAGNOSIS — I442 Atrioventricular block, complete: Secondary | ICD-10-CM | POA: Diagnosis not present

## 2016-01-16 NOTE — Patient Instructions (Addendum)
Medication Instructions:   Your physician recommends that you continue on your current medications as directed. Please refer to the Current Medication list given to you today.    Labwork: None ordered   Testing/Procedures: None ordered   Follow-Up: Remote monitoring is used to monitor your Pacemaker  from home. This monitoring reduces the number of office visits required to check your device to one time per year. It allows [place] to keep an eye on the functioning of your device to ensure it is working properly. You are scheduled for a device check from home on 04/16/16. You may send your transmission at any time that day. If you have a wireless device, the transmission will be sent automatically. After your physician reviews your transmission, you will receive a postcard with your next transmission date.    Your physician wants you to follow-up in: 81 months Chanetta Marshall, NP You will receive a reminder letter in the mail two months in advance. If you don't receive a letter, please call our office to schedule the follow-up appointment.   Any Other Special Instructions Will Be Listed Below (If Applicable).     If you need a refill on your cardiac medications before your next appointment, please call your pharmacy.

## 2016-01-16 NOTE — Progress Notes (Signed)
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 female who presents today for routine electrophysiology followup. She is doing well.  Recently in the ED for palpitations though device interrogation and ekg were uneventful.  She has venous insufficiency but does not wear support hose.  Today, she denies symptoms of palpitations, chest pain,  dizziness, presyncope, or syncope.  The patient is otherwise without complaint today.   Past Medical History:  Diagnosis Date  . Asthma   . CAD (coronary artery disease)    a. normal cors by cath in 03/2015  . Carotid artery disease ([place])   . Chronic anticoagulation   . Complete heart block ([place]) 02/2013   a. Implantation of a dual chamber pacemaker in 02/2013 by Dr Rayann Heman. STJ model number 8921 pacemaker with model number [id number] right ventricular lead.   [name] Dyslipidemia   . H/O Doppler ultrasound 10/03/2010   carotid duplex - R ICA normal patency; L CCA-ICA bypass gradt patent common to interal cartoid bypass graft   . H/O mechanical aortic valve replacement   . History of echocardiogram 11/08/2011   EF >55%; mild-mod concentric LVH; trace aortic regurg.; LA mild-mod dilated  . History of nuclear stress test 01/20/2010   normal pattern of perfusion; low risk scan  . Hypertension   . Lupus (systemic lupus erythematosus) ([place])   . PVD (peripheral vascular disease) (HCC)    60% R renal artery stenosis; non-functioning L kidney   Past Surgical History:  Procedure Laterality Date  . AORTIC VALVE REPLACEMENT  11/29/006   Gerhardt; [place]; on coumadin  . CARDIAC CATHETERIZATION  11/01/2004   define anatomy   . CARDIAC CATHETERIZATION N/A 04/19/2015   Procedure: Coronary Angiography;  Surgeon: Peter M [name], MD;  Location: [facility];  Service: Cardiovascular;  Laterality: N/A;  . CAROTID ENDARTERECTOMY     lt.  . CHOLECYSTECTOMY  1999  . EXPLORATORY LAPAROTOMY  04/12/2012   small bowel perf; ileostomy & R  hemicolectomy   . PACEMAKER INSERTION  03/06/2013   SJM Assurity DR PPM implanted by Dr Rayann Heman for complete heart block  . PERMANENT PACEMAKER INSERTION N/A 03/06/2013   Procedure: PERMANENT PACEMAKER INSERTION;  Surgeon: Coralyn Mark, MD;  Location: [facility];  Service: Cardiovascular;  Laterality: N/A;  . TEMPORARY PACEMAKER INSERTION N/A 03/06/2013   Procedure: TEMPORARY PACEMAKER INSERTION;  Surgeon: Coralyn Mark, MD;  Location: [facility];  Service: Cardiovascular;  Laterality: N/A;  . TUBAL LIGATION  11/1982    Current Outpatient Prescriptions  Medication Sig Dispense Refill  . acetaminophen (TYLENOL) 325 MG tablet Take 650 mg by mouth every 6 (six) hours as needed for moderate pain.     [name] ADVAIR DISKUS 250-50 MCG/DOSE AEPB Use 1 inhalation by mouth  two times daily 180 each 0  . allopurinol (ZYLOPRIM) 100 MG tablet Take 1 tablet by mouth daily.  3  . Calcium Carbonate (CALTRATE 600) 1500 MG TABS Take 1 tablet by mouth 2 (two) times daily.      . chlorhexidine (PERIDEX) 0.12 % solution USES ONCE EVERY FEW WEEKS IN WATER PIK AS DIRECTED  5  . cholecalciferol (VITAMIN D) 1000 UNITS tablet Take 2,000 Units by mouth daily.     . furosemide (LASIX) 40 MG tablet Take 1 tablet (40 mg total) by mouth daily. 30 tablet   . hydroxychloroquine (PLAQUENIL) 200 MG tablet Take 1 tablet (200 mg total) by mouth 2 (two) times daily.  60 tablet 11  . isosorbide mononitrate (IMDUR) 30 MG 24 hr tablet TAKE 1 TABLET(30 MG) BY MOUTH DAILY 30 tablet 11  . loratadine (CLARITIN) 10 MG tablet Take 10 mg by mouth daily.    . metoprolol succinate (TOPROL-XL) 50 MG 24 hr tablet Take 1 tablet (50 mg total) by mouth daily. 90 tablet 1  . predniSONE (DELTASONE) 5 MG tablet Take 5 mg by mouth daily.      . Probiotic Product (PROBIOTIC DAILY PO) Take 1 tablet by mouth daily.    . simvastatin (ZOCOR) 20 MG tablet Take 1 tablet by mouth at  bedtime 90 tablet 0  . warfarin (COUMADIN) 5 MG tablet Take 1 to 1 and 1/2  tablets by mouth daily as directed  by coumadin clinic (Patient taking differently: TAKES 7.5MG  ON TUES AND THURS, TAKES 5MG  ALL OTHER DAYS) 75 tablet 5   No current facility-administered medications for this visit.     Physical Exam: Vitals:   01/16/16 1341  BP: 120/82  Pulse: 74  Weight: 263 lb 9.6 oz (119.6 kg)  Height: 5\' 5"  (1.651 m)    GEN- The patient is overweight appearing, alert and oriented x 3 today.   Head- normocephalic, atraumatic Eyes-  Sclera clear, conjunctiva pink Ears- hearing intact Oropharynx- clear Lungs- few bibasilar rales, normal work of breathing Chest- pacemaker pocket is well healed Heart- Regular rate and rhythm, mechanical S2 GI- soft, NT, ND, + BS Extremities- no clubbing, cyanosis,trace edema  Pacemaker interrogation- reviewed in detail today,  See PACEART report Recent ED notes and ekgs are reviewed  Assessment and Plan:  1. complete heart block Normal pacemaker function See Pace Art report No changes today No arrhythmias detected I suspect that her recent palpitations were due to PVCs in the setting of transient increase in prednisone No further workup planned  2. Chronic diastolic dysfunction EF 94% No changes She appears dry on exam Would advise support hose for venous insufficiency May benefit from less diuretics Will defer to Dr Gwenlyn Found  3. S/p mechanical AVR Continue coumadin  4. atach She has episodic atach on PPM interrogation though this does not correspond to her symptoms No change in management  5. Morbid obesity She would benefit greatly from weight reduction Body mass index is 43.87 kg/m.   [place]  Return to see EP NP in the device clinic in 1 year Follow-up with Dr Gwenlyn Found as scheduled  Thompson Grayer MD, [facility] 01/16/2016 2:14 PM

## 2016-01-23 ENCOUNTER — Other Ambulatory Visit: Payer: Self-pay | Admitting: Cardiovascular Disease

## 2016-01-24 LAB — PROTIME-INR
INR: 3.7 — AB (ref 0.8–1.2)
Prothrombin Time: 36.3 s — ABNORMAL HIGH (ref 9.1–12.0)

## 2016-01-25 ENCOUNTER — Ambulatory Visit (INDEPENDENT_AMBULATORY_CARE_PROVIDER_SITE_OTHER): Payer: Medicare Other | Admitting: Pharmacist Clinician (PhC)/ Clinical Pharmacy Specialist

## 2016-01-25 DIAGNOSIS — Z952 Presence of prosthetic heart valve: Secondary | ICD-10-CM

## 2016-01-25 DIAGNOSIS — Z5181 Encounter for therapeutic drug level monitoring: Secondary | ICD-10-CM

## 2016-02-01 ENCOUNTER — Other Ambulatory Visit: Payer: Self-pay | Admitting: Internal Medicine

## 2016-02-03 ENCOUNTER — Other Ambulatory Visit: Payer: Self-pay | Admitting: *Deleted

## 2016-02-03 LAB — POCT INR: INR: 2.8

## 2016-02-07 ENCOUNTER — Telehealth: Payer: Self-pay | Admitting: Pharmacist

## 2016-02-07 ENCOUNTER — Ambulatory Visit (INDEPENDENT_AMBULATORY_CARE_PROVIDER_SITE_OTHER): Payer: Medicare Other | Admitting: Pharmacist

## 2016-02-07 DIAGNOSIS — Z5181 Encounter for therapeutic drug level monitoring: Secondary | ICD-10-CM

## 2016-02-07 DIAGNOSIS — Z952 Presence of prosthetic heart valve: Secondary | ICD-10-CM

## 2016-02-07 NOTE — Telephone Encounter (Signed)
Received letter from insurance about potential interaction with medications (warfarin and fluconazole)  Spoke with patient and states she was check on Friday Dec 8th - INR 3.3 and Dec 15th INR 2.8 at PCP, Dr. Glennon Mac.   See anticoag note from 02/07/16 for additional details.

## 2016-02-08 ENCOUNTER — Other Ambulatory Visit: Payer: Self-pay

## 2016-02-08 MED ORDER — ISOSORBIDE MONONITRATE ER 30 MG PO TB24: ORAL_TABLET | ORAL | 11 refills | Status: DC

## 2016-02-29 ENCOUNTER — Other Ambulatory Visit: Payer: Self-pay | Admitting: Cardiovascular Disease

## 2016-02-29 ENCOUNTER — Encounter: Payer: Medicare Other | Admitting: Cardiovascular Disease

## 2016-03-01 LAB — PROTIME-INR
INR: 2.9 — AB (ref 0.8–1.2)
PROTHROMBIN TIME: 28.5 s — AB (ref 9.1–12.0)

## 2016-03-02 ENCOUNTER — Ambulatory Visit (INDEPENDENT_AMBULATORY_CARE_PROVIDER_SITE_OTHER): Payer: Medicare Other | Admitting: Pharmacist

## 2016-03-02 DIAGNOSIS — Z952 Presence of prosthetic heart valve: Secondary | ICD-10-CM

## 2016-03-02 DIAGNOSIS — Z5181 Encounter for therapeutic drug level monitoring: Secondary | ICD-10-CM

## 2016-03-20 ENCOUNTER — Other Ambulatory Visit: Payer: Self-pay | Admitting: Student

## 2016-03-20 DIAGNOSIS — I4729 Other ventricular tachycardia: Secondary | ICD-10-CM

## 2016-03-20 DIAGNOSIS — I472 Ventricular tachycardia: Secondary | ICD-10-CM

## 2016-03-22 ENCOUNTER — Other Ambulatory Visit: Payer: Self-pay | Admitting: *Deleted

## 2016-03-22 DIAGNOSIS — I4729 Other ventricular tachycardia: Secondary | ICD-10-CM

## 2016-03-22 DIAGNOSIS — I472 Ventricular tachycardia: Secondary | ICD-10-CM

## 2016-03-22 MED ORDER — METOPROLOL SUCCINATE ER 50 MG PO TB24: 50.0000 mg | ORAL_TABLET | Freq: Every day | ORAL | 2 refills | Status: DC

## 2016-03-27 ENCOUNTER — Other Ambulatory Visit: Payer: Self-pay | Admitting: Cardiovascular Disease

## 2016-03-28 LAB — PROTIME-INR
INR: 4.5 — ABNORMAL HIGH (ref 0.8–1.2)
Prothrombin Time: 43 s — ABNORMAL HIGH (ref 9.1–12.0)

## 2016-03-29 ENCOUNTER — Ambulatory Visit (INDEPENDENT_AMBULATORY_CARE_PROVIDER_SITE_OTHER): Payer: Medicare Other | Admitting: Pharmacist

## 2016-03-29 DIAGNOSIS — Z5181 Encounter for therapeutic drug level monitoring: Secondary | ICD-10-CM

## 2016-03-29 DIAGNOSIS — Z952 Presence of prosthetic heart valve: Secondary | ICD-10-CM

## 2016-03-30 ENCOUNTER — Other Ambulatory Visit: Payer: Self-pay | Admitting: *Deleted

## 2016-03-30 DIAGNOSIS — I472 Ventricular tachycardia: Secondary | ICD-10-CM

## 2016-03-30 DIAGNOSIS — I4729 Other ventricular tachycardia: Secondary | ICD-10-CM

## 2016-03-30 MED ORDER — METOPROLOL SUCCINATE ER 50 MG PO TB24: 50.0000 mg | ORAL_TABLET | Freq: Every day | ORAL | 2 refills | Status: DC

## 2016-03-30 NOTE — Telephone Encounter (Signed)
Rx has been sent to the pharmacy electronically. ° °

## 2016-04-03 ENCOUNTER — Ambulatory Visit: Payer: Medicare Other | Admitting: Cardiovascular Disease

## 2016-04-10 ENCOUNTER — Other Ambulatory Visit: Payer: Self-pay | Admitting: Cardiovascular Disease

## 2016-04-10 LAB — PROTIME-INR: INR: 3.2 — AB (ref <=1.1)

## 2016-04-11 LAB — PROTIME-INR
INR: 3.2 — ABNORMAL HIGH (ref 0.8–1.2)
PROTHROMBIN TIME: 31.8 s — AB (ref 9.1–12.0)

## 2016-04-13 ENCOUNTER — Other Ambulatory Visit: Payer: Self-pay | Admitting: Cardiovascular Disease

## 2016-04-13 ENCOUNTER — Ambulatory Visit (INDEPENDENT_AMBULATORY_CARE_PROVIDER_SITE_OTHER): Payer: Medicare Other | Admitting: Pharmacist

## 2016-04-13 DIAGNOSIS — Z952 Presence of prosthetic heart valve: Secondary | ICD-10-CM | POA: Diagnosis not present

## 2016-04-13 DIAGNOSIS — Z5181 Encounter for therapeutic drug level monitoring: Secondary | ICD-10-CM

## 2016-04-13 DIAGNOSIS — I701 Atherosclerosis of renal artery: Secondary | ICD-10-CM

## 2016-04-16 ENCOUNTER — Telehealth: Payer: Self-pay | Admitting: Cardiology

## 2016-04-16 ENCOUNTER — Ambulatory Visit (INDEPENDENT_AMBULATORY_CARE_PROVIDER_SITE_OTHER): Payer: Medicare Other | Admitting: *Deleted

## 2016-04-16 DIAGNOSIS — I442 Atrioventricular block, complete: Secondary | ICD-10-CM | POA: Diagnosis not present

## 2016-04-16 NOTE — Progress Notes (Signed)
Remote pacemaker transmission.   

## 2016-04-16 NOTE — Telephone Encounter (Signed)
Spoke with pt and reminded pt of remote transmission that is due today. Pt verbalized understanding.   

## 2016-04-17 ENCOUNTER — Encounter: Payer: Self-pay | Admitting: Cardiology

## 2016-04-18 ENCOUNTER — Encounter: Payer: Self-pay | Admitting: Cardiovascular Disease

## 2016-04-18 ENCOUNTER — Ambulatory Visit (HOSPITAL_COMMUNITY)
Admission: RE | Admit: 2016-04-18 | Discharge: 2016-04-18 | Disposition: A | Discharge status: Home / Self Care | Payer: Medicare Other | Source: Ambulatory Visit | Attending: Cardiovascular Disease | Admitting: Cardiovascular Disease

## 2016-04-18 ENCOUNTER — Ambulatory Visit (INDEPENDENT_AMBULATORY_CARE_PROVIDER_SITE_OTHER): Payer: Medicare Other | Admitting: Cardiovascular Disease

## 2016-04-18 VITALS — BP 128/76 | HR 69 | Ht 65.0 in | Wt 264.0 lb

## 2016-04-18 DIAGNOSIS — R001 Bradycardia, unspecified: Secondary | ICD-10-CM | POA: Diagnosis not present

## 2016-04-18 DIAGNOSIS — I701 Atherosclerosis of renal artery: Secondary | ICD-10-CM

## 2016-04-18 DIAGNOSIS — I10 Essential (primary) hypertension: Secondary | ICD-10-CM | POA: Diagnosis not present

## 2016-04-18 DIAGNOSIS — I5031 Acute diastolic (congestive) heart failure: Secondary | ICD-10-CM

## 2016-04-18 DIAGNOSIS — I359 Nonrheumatic aortic valve disorder, unspecified: Secondary | ICD-10-CM | POA: Diagnosis not present

## 2016-04-18 DIAGNOSIS — I6523 Occlusion and stenosis of bilateral carotid arteries: Secondary | ICD-10-CM

## 2016-04-18 LAB — CUP PACEART REMOTE DEVICE CHECK
Battery Remaining Longevity: 118 mo
Battery Remaining Percentage: 95.5 %
Battery Voltage: 2.99 V
Brady Statistic RA Percent Paced: 19 %
Date Time Interrogation Session: 20180226173019
Implantable Lead Implant Date: 20150116
Implantable Lead Location: 753859
Implantable Lead Model: 5076
Implantable Pulse Generator Implant Date: 20150116
Lead Channel Impedance Value: 400 Ohm
Lead Channel Pacing Threshold Amplitude: 0.75 V
Lead Channel Pacing Threshold Pulse Width: 0.4 ms
Lead Channel Sensing Intrinsic Amplitude: 4.6 mV
Lead Channel Setting Pacing Amplitude: 2 V
Lead Channel Setting Sensing Sensitivity: 2 mV
MDC IDC LEAD IMPLANT DT: 20150116
MDC IDC LEAD LOCATION: 753860
MDC IDC MSMT LEADCHNL RV IMPEDANCE VALUE: 560 Ohm
MDC IDC MSMT LEADCHNL RV PACING THRESHOLD AMPLITUDE: 1.875 V
MDC IDC MSMT LEADCHNL RV PACING THRESHOLD PULSEWIDTH: 0.4 ms
MDC IDC MSMT LEADCHNL RV SENSING INTR AMPL: 6.9 mV
MDC IDC SET LEADCHNL RV PACING AMPLITUDE: 2.125
MDC IDC SET LEADCHNL RV PACING PULSEWIDTH: 0.4 ms
MDC IDC STAT BRADY AP VP PERCENT: 20 %
MDC IDC STAT BRADY AP VS PERCENT: 1 %
MDC IDC STAT BRADY AS VP PERCENT: 80 %
MDC IDC STAT BRADY AS VS PERCENT: 1 %
MDC IDC STAT BRADY RV PERCENT PACED: 99 %
Pulse Gen Model: 2240
Pulse Gen Serial Number: 7587004

## 2016-04-18 NOTE — Assessment & Plan Note (Signed)
History of carotid artery disease status post left carotid endarterectomy in 2000. Her most recent Doppler performed 08/10/15 revealed an occluded left carotid with a widely patent right carotid artery.

## 2016-04-18 NOTE — Assessment & Plan Note (Signed)
Patient was hospitalized 12/21/15 for 2 days because of volume overload. She was diuresed. She has 1+ to 2+ left lower extremity edema which is chronic. She is aware. Restriction. Her last 2-D echo performed several years ago revealed an EF of 35%.

## 2016-04-18 NOTE — Patient Instructions (Signed)
Medication Instructions: Your physician recommends that you continue on your current medications as directed. Please refer to the Current Medication list given to you today.   Testing/Procedures: Your physician has requested that you have an echocardiogram. Echocardiography is a painless test that uses sound waves to create images of your heart. It provides your doctor with information about the size and shape of your heart and how well your heart's chambers and valves are working. This procedure takes approximately one hour. There are no restrictions for this procedure.  Follow-Up: We request that you follow-up in: 6 months with an extender and in 12 months with Dr Berry  You will receive a reminder letter in the mail two months in advance. If you don't receive a letter, please call our office to schedule the follow-up appointment.  If you need a refill on your cardiac medications before your next appointment, please call your pharmacy.  

## 2016-04-18 NOTE — Assessment & Plan Note (Signed)
History of hyperlipidemia on statin therapy followed by her PCP. 

## 2016-04-18 NOTE — Assessment & Plan Note (Signed)
History of 60% right renal artery stenosis which had been we have been following by the ultrasound.

## 2016-04-18 NOTE — Assessment & Plan Note (Signed)
Status post aortic valve replacement 2006 by Dr. [name] for severe aortic insufficiency. This was done with a St. Jude mechanical valve. She is on Coumadin anticoagulation. Her last 2-D echo performed in 2015 showed an EF of 35%. We will repeat a 2-D echocardiogram.

## 2016-04-18 NOTE — Progress Notes (Signed)
04/18/2016 [name]   October 08, 1950  [id number]  Primary Physician [name] Messick, MD Primary Cardiologist: [name] Harp MD [name]  HPI:  The patient is a this 66 year old, moderately overweight, almost divorced Caucasian female mother of 47, grandmother to 2 grandchildren who I last saw in the office 06/07/15.  She had a St. Jude AVR performed by [name] September of 2006 because of severe aortic insufficiency. She had normal coronary arteries by cath at that time. A look at her renal arteries and demonstrated a 60% right renal artery stenosis and patent accessory left renal arteries. By duplex she does have a non-functioning left kidney. Her other problems include hypertension and hyperlipidemia. She also has a history of lupus followed by [name]. An echo performed a year ago showed a well-functioning aortic prosthesis, and recent renal Dopplers revealed a widely patent right renal artery. She had a small bowel obstruction and underwent surgery at [facility] for perforated small bowel. She had an exploratory laparotomy with ileostomy and a right hemicolectomy 04/12/12. She saw [name] PAC back in our office 06/12/12 and her medicines were adjusted.  She was admitted in early January for symptomatic complete heart block underwent permanent pacemaker insertion by [name].[name] She was remitted approximately week later with orthostatic hypotension and was dehydrated. She responded to fluid resuscitation.  Since I saw her a year ago she was admitted with heart failure requiring diuresis secondary to diastolic dysfunction and dietary indiscretion. In addition, she recently underwent an cardiac catheterization by [name] because of chest pain 04/19/15  revealing normal coronary arteries.  She was admitted to [facility] of volume overload 12/21/15 and was diuresed.   Current Outpatient Prescriptions  Medication Sig Dispense Refill  .  acetaminophen (TYLENOL) 325 MG tablet Take 650 mg by mouth every 6 (six) hours as needed for moderate pain.     [name] ADVAIR DISKUS 250-50 MCG/DOSE AEPB Use 1 inhalation by mouth  two times daily 180 each 0  . allopurinol (ZYLOPRIM) 100 MG tablet Take 1 tablet by mouth daily.  3  . Calcium Carbonate (CALTRATE 600) 1500 MG TABS Take 1 tablet by mouth 2 (two) times daily.      . chlorhexidine (PERIDEX) 0.12 % solution USES ONCE EVERY FEW WEEKS IN WATER PIK AS DIRECTED  5  . cholecalciferol (VITAMIN D) 1000 UNITS tablet Take 2,000 Units by mouth daily.     . furosemide (LASIX) 40 MG tablet Take 1 tablet (40 mg total) by mouth daily. 30 tablet   . hydroxychloroquine (PLAQUENIL) 200 MG tablet Take 1 tablet (200 mg total) by mouth 2 (two) times daily. 60 tablet 11  . isosorbide mononitrate (IMDUR) 30 MG 24 hr tablet TAKE 1 TABLET(30 MG) BY MOUTH DAILY 30 tablet 11  . loratadine (CLARITIN) 10 MG tablet Take 10 mg by mouth daily.    . metoprolol succinate (TOPROL-XL) 50 MG 24 hr tablet Take 1 tablet (50 mg total) by mouth daily. Take with or immediately following a meal. 90 tablet 2  . predniSONE (DELTASONE) 5 MG tablet Take 5 mg by mouth daily.      . Probiotic Product (PROBIOTIC DAILY PO) Take 1 tablet by mouth daily.    . simvastatin (ZOCOR) 20 MG tablet Take 1 tablet by mouth at  bedtime 90 tablet 0  . warfarin (COUMADIN) 5 MG tablet Take 1 to 1 and 1/2 tablets by mouth daily as directed  by coumadin clinic (  Patient taking differently: TAKES 7.5MG  ON TUES AND THURS, TAKES 5MG  ALL OTHER DAYS) 75 tablet 5   No current facility-administered medications for this visit.     No Known Allergies  Social History   Social History  . Marital status: Divorced    Spouse name: N/A  . Number of children: 3  . Years of education: N/A   Occupational History  . Retired    Social History Main Topics  . Smoking status: Never Smoker  . Smokeless tobacco: Never Used  . Alcohol use No  . Drug use: No  . Sexual  activity: Not on file   Other Topics Concern  . Not on file   Social History Narrative   Lives alone in [place].     Review of Systems: General: negative for chills, fever, night sweats or weight changes.  Cardiovascular: negative for chest pain, dyspnea on exertion, edema, orthopnea, palpitations, paroxysmal nocturnal dyspnea or shortness of breath Dermatological: negative for rash Respiratory: negative for cough or wheezing Urologic: negative for hematuria Abdominal: negative for nausea, vomiting, diarrhea, bright red blood per rectum, melena, or hematemesis Neurologic: negative for visual changes, syncope, or dizziness All other systems reviewed and are otherwise negative except as noted above.    Blood pressure 128/76, pulse 69, height 5\' 5"  (1.651 m), weight 264 lb (119.7 kg).  General appearance: alert and no distress Neck: no adenopathy, no JVD, supple, symmetrical, trachea midline, thyroid not enlarged, symmetric, no tenderness/mass/nodules and Soft right carotid bruit Lungs: clear to auscultation bilaterally Heart: [name] mechanical aortic valve sounds. Extremities: 2+ left, 1+ right lower extremity pitting edema.  EKG not performed today.  ASSESSMENT AND PLAN:   S/P AVR (aortic valve replacement) Status post aortic valve replacement 2006 by Dr. [name] for severe aortic insufficiency. This was done with a St. Jude mechanical valve. She is on Coumadin anticoagulation. Her last 2-D echo performed in 2015 showed an EF of 35%. We will repeat a 2-D echocardiogram.  Hyperlipidemia History of hyperlipidemia on statin therapy followed by her PCP.  Essential hypertension History of hypertension with blood pressure stable 120/76. She is on metoprolol. Continue current meds at current dosing  Symptomatic bradycardia History of symptomatically bradycardia and complete heart block status post permanent transvenous pacemaker insertion by Dr. Rayann [name] which he follows  quarterly by [facility]. He last saw her 01/16/16 in the office. He suspected her palpitations related to PVCs.  Renal artery stenosis (HCC) History of 60% right renal artery stenosis which had been we have been following by the ultrasound.  Carotid stenosis History of carotid artery disease status post left carotid endarterectomy in 2000. Her most recent Doppler performed 08/10/15 revealed an occluded left carotid with a widely patent right carotid artery.  Acute congestive heart failure with left ventricular diastolic dysfunction ([place]) Patient was hospitalized 12/21/15 for 2 days because of volume overload. She was diuresed. She has 1+ to 2+ left lower extremity edema which is chronic. She is aware. Restriction. Her last 2-D echo performed several years ago revealed an EF of 35%.      [name] Harp MD FACP,FACC,FAHA, [facility] 04/18/2016 12:13 PM

## 2016-04-18 NOTE — Assessment & Plan Note (Signed)
History of hypertension with blood pressure stable 120/76. She is on metoprolol. Continue current meds at current dosing

## 2016-04-18 NOTE — Assessment & Plan Note (Signed)
History of symptomatically bradycardia and complete heart block status post permanent transvenous pacemaker insertion by Dr. Rayann Heman which he follows quarterly by [facility]. He last saw her 01/16/16 in the office. He suspected her palpitations related to PVCs.

## 2016-04-25 ENCOUNTER — Telehealth: Payer: Self-pay | Admitting: Cardiovascular Disease

## 2016-04-25 ENCOUNTER — Other Ambulatory Visit: Payer: Self-pay | Admitting: Cardiovascular Disease

## 2016-04-25 DIAGNOSIS — I701 Atherosclerosis of renal artery: Secondary | ICD-10-CM

## 2016-04-25 NOTE — Telephone Encounter (Signed)
VAS US RENAL ARTERY DUPLEX  Order: [id number]  Status:  Final result Visible to patient:  No (Not Released) Dx:  Renal artery stenosis ([place])  Notes Recorded by Therisa Doyne on 04/25/2016 at 3:49 PM EST Results given to pt. Pt verbalized understanding. Repeat order entered.  ------  Notes Recorded by [name], MD on 04/20/2016 at 1:19 PM EST Poor quality image without abilities assessed velocities in the proximal right renal artery. Left renal artery known to be occluded. Right renal dimensions have remained stable. Repeat 12 months

## 2016-05-07 ENCOUNTER — Other Ambulatory Visit: Payer: Self-pay | Admitting: Cardiovascular Disease

## 2016-05-08 ENCOUNTER — Other Ambulatory Visit: Payer: Self-pay | Admitting: Internal Medicine

## 2016-05-08 LAB — PROTIME-INR
INR: 4.6 — AB (ref 0.8–1.2)
PROTHROMBIN TIME: 43.8 s — AB (ref 9.1–12.0)

## 2016-05-10 ENCOUNTER — Ambulatory Visit (INDEPENDENT_AMBULATORY_CARE_PROVIDER_SITE_OTHER): Payer: Medicare Other | Admitting: Pharmacist Clinician (PhC)/ Clinical Pharmacy Specialist

## 2016-05-10 DIAGNOSIS — Z952 Presence of prosthetic heart valve: Secondary | ICD-10-CM

## 2016-05-10 DIAGNOSIS — Z5181 Encounter for therapeutic drug level monitoring: Secondary | ICD-10-CM

## 2016-05-11 ENCOUNTER — Telehealth: Payer: Self-pay | Admitting: Internal Medicine

## 2016-05-11 MED ORDER — FLUTICASONE-SALMETEROL 250-50 MCG/DOSE IN AEPB: INHALATION_SPRAY | RESPIRATORY_TRACT | 3 refills | Status: DC

## 2016-05-11 NOTE — Telephone Encounter (Signed)
Called and spoke with pt and she is aware of refill that has been sent in to the mail order pharmacy.  Nothing further is needed.

## 2016-05-22 ENCOUNTER — Other Ambulatory Visit: Payer: Self-pay | Admitting: Cardiovascular Disease

## 2016-05-23 ENCOUNTER — Other Ambulatory Visit: Payer: Self-pay | Admitting: Cardiovascular Disease

## 2016-05-24 LAB — PROTIME-INR
INR: 3.2 — AB (ref 0.8–1.2)
PROTHROMBIN TIME: 31.8 s — AB (ref 9.1–12.0)

## 2016-05-28 ENCOUNTER — Ambulatory Visit (INDEPENDENT_AMBULATORY_CARE_PROVIDER_SITE_OTHER): Payer: Medicare Other | Admitting: Pharmacist

## 2016-05-28 DIAGNOSIS — Z5181 Encounter for therapeutic drug level monitoring: Secondary | ICD-10-CM

## 2016-05-28 DIAGNOSIS — Z952 Presence of prosthetic heart valve: Secondary | ICD-10-CM

## 2016-05-29 ENCOUNTER — Telehealth: Payer: Self-pay | Admitting: Pharmacist

## 2016-05-29 DIAGNOSIS — E785 Hyperlipidemia, unspecified: Secondary | ICD-10-CM

## 2016-05-29 NOTE — Telephone Encounter (Signed)
Standing order for INR printed and mailed to patient as requested.  Next INR in 3 weeks while in [place]

## 2016-05-31 ENCOUNTER — Ambulatory Visit (HOSPITAL_COMMUNITY): Payer: Medicare Other | Attending: Internal Medicine

## 2016-05-31 ENCOUNTER — Other Ambulatory Visit: Payer: Self-pay

## 2016-05-31 DIAGNOSIS — I359 Nonrheumatic aortic valve disorder, unspecified: Secondary | ICD-10-CM | POA: Insufficient documentation

## 2016-05-31 DIAGNOSIS — I272 Pulmonary hypertension, unspecified: Secondary | ICD-10-CM | POA: Diagnosis not present

## 2016-05-31 DIAGNOSIS — Z952 Presence of prosthetic heart valve: Secondary | ICD-10-CM | POA: Diagnosis not present

## 2016-06-05 ENCOUNTER — Telehealth: Payer: Self-pay | Admitting: Cardiovascular Disease

## 2016-06-05 NOTE — Telephone Encounter (Signed)
New message    Pt is calling about her echo results.

## 2016-06-05 NOTE — Telephone Encounter (Signed)
Returned call to patient-requesting echo results, reports she is SOB and has LE edema (states she always has this), seeing PCP today to evaluate her lungs.    Also states she is traveling on Friday and wanted to make sure everything is okay before she flies.      Advised I would route to Dr. Gwenlyn Found to review.

## 2016-06-06 NOTE — Telephone Encounter (Signed)
Patient made aware of results and recommendations

## 2016-06-06 NOTE — Telephone Encounter (Signed)
Her LV function is normal and her valve is functioning well. She does have severe TR. It was moderate 2 years ago. This should not affect her ability to travel. She has chronic lower extremity edema.

## 2016-06-07 ENCOUNTER — Telehealth: Payer: Self-pay | Admitting: Cardiovascular Disease

## 2016-06-07 NOTE — Telephone Encounter (Signed)
New Message  Pt call requesting to speak with RN about flying on an airplane. Please call back to discuss

## 2016-06-07 NOTE — Telephone Encounter (Signed)
Pt of Dr. Gwenlyn Found PCP Mishi Carmell [name] to patient. Notes she was seen by PCP yesterday and earlier last week for some fatigue, shortness of breath she's been having. Her heartbeat was "a little irregular". Was put on new fluid pills, reports now feeling better. Think some of her feeling unwell was due to nerves/anxiety.  She's been cleared to travel by PCP but wanted to double check w Dr. Gwenlyn Found. She is traveling out of town tomorrow. Advised if PCP stated she had no remaining concerns, patient should be fine to proceed w these plans.   She states PCP office sent records of most recent visit, but I'm unable to confirm that these have been received. At pt request, will see if Dr. Gwenlyn Found has further recommendations at this time.

## 2016-06-10 NOTE — Telephone Encounter (Signed)
I've spoken to her PCP about her and apparently she's heading to [place] for a month. Her PCP suggested that she see an MD out there as well. We will arrange an OV here as soon as she returns.   JJB

## 2016-06-19 LAB — PROTIME-INR: INR: 3.7 — AB (ref 0.9–1.1)

## 2016-06-19 NOTE — Telephone Encounter (Signed)
Pt scheduled for appt with Dr. Gwenlyn Found on 08/01/16.

## 2016-06-26 ENCOUNTER — Ambulatory Visit (INDEPENDENT_AMBULATORY_CARE_PROVIDER_SITE_OTHER): Payer: Medicare Other | Admitting: Pharmacist

## 2016-06-26 DIAGNOSIS — Z952 Presence of prosthetic heart valve: Secondary | ICD-10-CM

## 2016-06-26 DIAGNOSIS — Z5181 Encounter for therapeutic drug level monitoring: Secondary | ICD-10-CM

## 2016-07-10 ENCOUNTER — Other Ambulatory Visit: Payer: Self-pay | Admitting: Cardiovascular Disease

## 2016-07-11 LAB — PROTIME-INR
INR: 2.9 — AB (ref 0.8–1.2)
Prothrombin Time: 28.3 s — ABNORMAL HIGH (ref 9.1–12.0)

## 2016-07-12 ENCOUNTER — Ambulatory Visit (INDEPENDENT_AMBULATORY_CARE_PROVIDER_SITE_OTHER): Payer: Medicare Other | Admitting: Pharmacist Clinician (PhC)/ Clinical Pharmacy Specialist

## 2016-07-12 DIAGNOSIS — Z5181 Encounter for therapeutic drug level monitoring: Secondary | ICD-10-CM

## 2016-07-12 DIAGNOSIS — Z952 Presence of prosthetic heart valve: Secondary | ICD-10-CM

## 2016-07-17 ENCOUNTER — Ambulatory Visit (INDEPENDENT_AMBULATORY_CARE_PROVIDER_SITE_OTHER): Payer: Medicare Other | Admitting: *Deleted

## 2016-07-17 DIAGNOSIS — R001 Bradycardia, unspecified: Secondary | ICD-10-CM | POA: Diagnosis not present

## 2016-07-17 NOTE — Progress Notes (Signed)
Remote pacemaker transmission.   

## 2016-07-18 LAB — CUP PACEART REMOTE DEVICE CHECK
Battery Remaining Longevity: 131 mo
Battery Remaining Percentage: 95.5 %
Battery Voltage: 2.99 V
Brady Statistic AS VS Percent: 1 %
Brady Statistic RA Percent Paced: 17 %
Implantable Lead Implant Date: 20150116
Implantable Lead Implant Date: 20150116
Implantable Lead Location: 753859
Implantable Pulse Generator Implant Date: 20150116
Lead Channel Impedance Value: 480 Ohm
Lead Channel Impedance Value: 660 Ohm
Lead Channel Pacing Threshold Amplitude: 0.75 V
Lead Channel Pacing Threshold Amplitude: 0.75 V
Lead Channel Pacing Threshold Pulse Width: 0.4 ms
Lead Channel Sensing Intrinsic Amplitude: 5 mV
Lead Channel Setting Sensing Sensitivity: 2 mV
MDC IDC LEAD LOCATION: 753860
MDC IDC MSMT LEADCHNL RV PACING THRESHOLD PULSEWIDTH: 0.4 ms
MDC IDC MSMT LEADCHNL RV SENSING INTR AMPL: 6.9 mV
MDC IDC PG SERIAL: 7587004
MDC IDC SESS DTM: 20180529100334
MDC IDC SET LEADCHNL RA PACING AMPLITUDE: 2 V
MDC IDC SET LEADCHNL RV PACING AMPLITUDE: 1 V
MDC IDC SET LEADCHNL RV PACING PULSEWIDTH: 0.4 ms
MDC IDC STAT BRADY AP VP PERCENT: 18 %
MDC IDC STAT BRADY AP VS PERCENT: 1 %
MDC IDC STAT BRADY AS VP PERCENT: 82 %
MDC IDC STAT BRADY RV PERCENT PACED: 99 %

## 2016-07-24 ENCOUNTER — Encounter: Payer: Self-pay | Admitting: Cardiology

## 2016-07-31 ENCOUNTER — Ambulatory Visit: Payer: Medicare Other | Admitting: Cardiovascular Disease

## 2016-08-01 ENCOUNTER — Ambulatory Visit (INDEPENDENT_AMBULATORY_CARE_PROVIDER_SITE_OTHER): Payer: Medicare Other | Admitting: Cardiovascular Disease

## 2016-08-01 ENCOUNTER — Encounter: Payer: Self-pay | Admitting: Cardiovascular Disease

## 2016-08-01 DIAGNOSIS — I10 Essential (primary) hypertension: Secondary | ICD-10-CM

## 2016-08-01 DIAGNOSIS — I701 Atherosclerosis of renal artery: Secondary | ICD-10-CM

## 2016-08-01 DIAGNOSIS — I442 Atrioventricular block, complete: Secondary | ICD-10-CM

## 2016-08-01 DIAGNOSIS — Z952 Presence of prosthetic heart valve: Secondary | ICD-10-CM | POA: Diagnosis not present

## 2016-08-01 DIAGNOSIS — E7800 Pure hypercholesterolemia, unspecified: Secondary | ICD-10-CM | POA: Diagnosis not present

## 2016-08-01 DIAGNOSIS — I5031 Acute diastolic (congestive) heart failure: Secondary | ICD-10-CM

## 2016-08-01 DIAGNOSIS — I6522 Occlusion and stenosis of left carotid artery: Secondary | ICD-10-CM

## 2016-08-01 NOTE — Assessment & Plan Note (Addendum)
History of known carotid disease status post left carotid endarterectomy in 2000 with Dopplers performed 08/10/15 revealing an occluded left internal carotid artery with only mild right ICA stenosis. We are following this every other year by duplex  ultrasound.

## 2016-08-01 NOTE — Assessment & Plan Note (Signed)
History of renal artery stenosis with no nonfunctioning left kidney and 60% right renal artery stenosis which we followed by duplex ultrasound and this has remained unchanged with a stable renal dimension

## 2016-08-01 NOTE — Assessment & Plan Note (Signed)
History of diastolic heart failure currently on spironolactone maintaining euvolemic.

## 2016-08-01 NOTE — Addendum Note (Signed)
Addended by: Therisa Doyne on: 08/01/2016 09:56 AM   Modules accepted: Orders

## 2016-08-01 NOTE — Assessment & Plan Note (Signed)
History of hyperlipidemia not on statin therapy followed by her PCP 

## 2016-08-01 NOTE — Assessment & Plan Note (Signed)
History of complete heart block status post permanent transient venous pacemaker insertion by Dr. Rayann Heman which she follows remotely.

## 2016-08-01 NOTE — Assessment & Plan Note (Signed)
History of aortic insufficiency status post St. Jude aortic valve replacement by Dr. [name] in September 2006. Catheterization at that time showed normal coronary arteries. Recent 2-D echo performed 05/31/16 revealed normal LV systolic function, a well-functioning aorta aortic mechanical prosthesis with severe TR and mild pulmonary hypertension. She is on Coumadin anticoagulation.

## 2016-08-01 NOTE — Patient Instructions (Signed)

## 2016-08-01 NOTE — Progress Notes (Signed)
08/01/2016 [name]   Sep 16, 1950  [id number]  Primary Physician [name] Messick, MD Primary Cardiologist: [name] Harp MD [name], [name]  HPI:  The patient is a this 66 year old, moderately overweight, almost divorced Caucasian female mother of 49, grandmother to 2 grandchildren who I last saw in the office 04/18/16.  She had a St. Jude AVR performed by Dr. Ceasar Mons September of 2006 because of severe aortic insufficiency. She had normal coronary arteries by cath at that time. A look at her renal arteries and demonstrated a 60% right renal artery stenosis and patent accessory left renal arteries. By duplex she does have a non-functioning left kidney. Her other problems include hypertension and hyperlipidemia. She also has a history of lupus followed by Dr. Jalene Mullet. An echo performed a year ago showed a well-functioning aortic prosthesis, and recent renal Dopplers revealed a widely patent right renal artery. She had a small bowel obstruction and underwent surgery at [facility] for perforated small bowel. She had an exploratory laparotomy with ileostomy and a right hemicolectomy 04/12/12. She saw [name] PAC back in our office 06/12/12 and her medicines were adjusted.  She was admitted in early January for symptomatic complete heart block underwent permanent pacemaker insertion by Dr. Thompson Grayer.[name] She was remitted approximately week later with orthostatic hypotension and was dehydrated. She responded to fluid resuscitation.  Since I saw her a year ago she was admitted with heart failure requiring diuresis secondary to diastolic dysfunction and dietary indiscretion. In addition, she recently underwent an cardiac catheterization by Dr. [name] because of chest pain 04/19/15 revealing normal coronary arteries.  She was admitted to [facility] of volume overload 12/21/15 and was diuresed. She currently is on spiral lactone be done by her PCP which has allowed her to  maintain normal fluid balance. She denies chest pain or shortness of breath.   Current Outpatient Prescriptions  Medication Sig Dispense Refill  . acetaminophen (TYLENOL) 325 MG tablet Take 650 mg by mouth every 6 (six) hours as needed for moderate pain.     . Calcium Carbonate (CALTRATE 600) 1500 MG TABS Take 1 tablet by mouth 2 (two) times daily.      . chlorhexidine (PERIDEX) 0.12 % solution USES ONCE EVERY FEW WEEKS IN WATER PIK AS DIRECTED  5  . cholecalciferol (VITAMIN D) 1000 UNITS tablet Take 2,000 Units by mouth daily.     . febuxostat (ULORIC) 40 MG tablet Take 1 tablet by mouth daily.    . Fluticasone-Salmeterol (ADVAIR DISKUS) 250-50 MCG/DOSE AEPB Use 1 inhalation by mouth  two times daily 180 each 3  . furosemide (LASIX) 40 MG tablet Take 1 tablet (40 mg total) by mouth daily. 30 tablet   . hydroxychloroquine (PLAQUENIL) 200 MG tablet Take 1 tablet (200 mg total) by mouth 2 (two) times daily. 60 tablet 11  . isosorbide mononitrate (IMDUR) 30 MG 24 hr tablet TAKE 1 TABLET(30 MG) BY MOUTH DAILY 30 tablet 11  . loratadine (CLARITIN) 10 MG tablet Take 10 mg by mouth daily.    . metoprolol succinate (TOPROL-XL) 50 MG 24 hr tablet Take 1 tablet (50 mg total) by mouth daily. Take with or immediately following a meal. 90 tablet 2  . predniSONE (DELTASONE) 5 MG tablet Take 5 mg by mouth daily.      . Probiotic Product (PROBIOTIC DAILY PO) Take 1 tablet by mouth daily.    . simvastatin (ZOCOR) 20 MG tablet Take 1 tablet by mouth  at  bedtime 90 tablet 0  . spironolactone (ALDACTONE) 50 MG tablet Take 1 tablet by mouth daily.  5  . warfarin (COUMADIN) 5 MG tablet TAKE 1 TO 1 AND 1/2 TABLETS BY MOUTH DAILY AS DIRECTED  BY COUMADIN CLINIC 135 tablet 1   No current facility-administered medications for this visit.     No Known Allergies  Social History   Social History  . Marital status: Divorced    Spouse name: N/A  . Number of children: 3  . Years of education: N/A   Occupational  History  . Retired    Social History Main Topics  . Smoking status: Never Smoker  . Smokeless tobacco: Never Used  . Alcohol use No  . Drug use: No  . Sexual activity: Not on file   Other Topics Concern  . Not on file   Social History Narrative   Lives alone in [place].     Review of Systems: General: negative for chills, fever, night sweats or weight changes.  Cardiovascular: negative for chest pain, dyspnea on exertion, edema, orthopnea, palpitations, paroxysmal nocturnal dyspnea or shortness of breath Dermatological: negative for rash Respiratory: negative for cough or wheezing Urologic: negative for hematuria Abdominal: negative for nausea, vomiting, diarrhea, bright red blood per rectum, melena, or hematemesis Neurologic: negative for visual changes, syncope, or dizziness All other systems reviewed and are otherwise negative except as noted above.    Blood pressure 130/88, pulse 71, height 5\' 5"  (1.651 m), weight 248 lb 6.4 oz (112.7 kg), SpO2 97 %.  General appearance: alert and no distress Neck: no adenopathy, no carotid bruit, no JVD, supple, symmetrical, trachea midline and thyroid not enlarged, symmetric, no tenderness/mass/nodules Lungs: clear to auscultation bilaterally Heart: Soft but crisp mechanical aortic valve sounds Extremities: extremities normal, atraumatic, no cyanosis or edema  EKG not performed today  ASSESSMENT AND PLAN:   S/P AVR (aortic valve replacement) History of aortic insufficiency status post St. Jude aortic valve replacement by Dr. Servando [name] in September 2006. Catheterization at that time showed normal coronary arteries. Recent 2-D echo performed 05/31/16 revealed normal LV systolic function, a well-functioning aorta aortic mechanical prosthesis with severe TR and mild pulmonary hypertension. She is on Coumadin anticoagulation.  Hyperlipidemia History of hyperlipidemia not on statin therapy followed by her PCP  Essential  hypertension History of essential hypertension blood pressure 130/88. She is on metoprolol. Continue current meds at current dosing  Complete heart block (HCC) History of complete heart block status post permanent transient venous pacemaker insertion by Dr. Rayann Heman which she follows remotely.  Renal artery stenosis (HCC) History of renal artery stenosis with no nonfunctioning left kidney and 60% right renal artery stenosis which we followed by duplex ultrasound and this has remained unchanged with a stable renal dimension  Carotid stenosis History of known carotid disease status post left carotid endarterectomy in 2000 with Dopplers performed 08/10/15 revealing an occluded left internal carotid artery with only mild right ICA stenosis. We are following this every other year by duplex  ultrasound.  Acute congestive heart failure with left ventricular diastolic dysfunction (HCC) History of diastolic heart failure currently on spironolactone maintaining euvolemic.      [name] Harp MD FACP,FACC,FAHA, [facility] 08/01/2016 9:52 AM

## 2016-08-01 NOTE — Assessment & Plan Note (Signed)
History of essential hypertension blood pressure 130/88. She is on metoprolol. Continue current meds at current dosing

## 2016-08-15 ENCOUNTER — Other Ambulatory Visit: Payer: Self-pay | Admitting: Cardiovascular Disease

## 2016-08-16 LAB — PROTIME-INR
INR: 5.2 — AB (ref 0.8–1.2)
PROTHROMBIN TIME: 48.9 s — AB (ref 9.1–12.0)

## 2016-08-17 ENCOUNTER — Ambulatory Visit (INDEPENDENT_AMBULATORY_CARE_PROVIDER_SITE_OTHER): Payer: Medicare Other | Admitting: Pharmacist

## 2016-08-17 DIAGNOSIS — Z952 Presence of prosthetic heart valve: Secondary | ICD-10-CM

## 2016-08-17 DIAGNOSIS — Z5181 Encounter for therapeutic drug level monitoring: Secondary | ICD-10-CM

## 2016-08-27 ENCOUNTER — Other Ambulatory Visit: Payer: Self-pay | Admitting: Cardiovascular Disease

## 2016-08-28 LAB — PROTIME-INR
INR: 2.2 — ABNORMAL HIGH (ref 0.8–1.2)
PROTHROMBIN TIME: 22.4 s — AB (ref 9.1–12.0)

## 2016-08-30 ENCOUNTER — Ambulatory Visit (INDEPENDENT_AMBULATORY_CARE_PROVIDER_SITE_OTHER): Payer: Medicare Other | Admitting: Pharmacist

## 2016-08-30 DIAGNOSIS — Z5181 Encounter for therapeutic drug level monitoring: Secondary | ICD-10-CM

## 2016-08-30 DIAGNOSIS — Z952 Presence of prosthetic heart valve: Secondary | ICD-10-CM

## 2016-10-02 ENCOUNTER — Other Ambulatory Visit: Payer: Self-pay | Admitting: Cardiovascular Disease

## 2016-10-03 LAB — PROTIME-INR
INR: 2.3 — AB (ref 0.8–1.2)
Prothrombin Time: 22.9 s — ABNORMAL HIGH (ref 9.1–12.0)

## 2016-10-05 ENCOUNTER — Ambulatory Visit (INDEPENDENT_AMBULATORY_CARE_PROVIDER_SITE_OTHER): Payer: Self-pay | Admitting: Pharmacist

## 2016-10-05 DIAGNOSIS — Z952 Presence of prosthetic heart valve: Secondary | ICD-10-CM

## 2016-10-05 DIAGNOSIS — Z5181 Encounter for therapeutic drug level monitoring: Secondary | ICD-10-CM

## 2016-10-16 ENCOUNTER — Ambulatory Visit (INDEPENDENT_AMBULATORY_CARE_PROVIDER_SITE_OTHER): Payer: Medicare Other | Admitting: *Deleted

## 2016-10-16 DIAGNOSIS — I442 Atrioventricular block, complete: Secondary | ICD-10-CM

## 2016-10-16 NOTE — Progress Notes (Signed)
Remote pacemaker transmission.   

## 2016-10-17 ENCOUNTER — Telehealth: Payer: Self-pay | Admitting: Pharmacist Clinician (PhC)/ Clinical Pharmacy Specialist

## 2016-10-17 NOTE — Telephone Encounter (Signed)
Coumadin letter 

## 2016-10-18 ENCOUNTER — Telehealth: Payer: Self-pay | Admitting: Pharmacist Clinician (PhC)/ Clinical Pharmacy Specialist

## 2016-10-18 NOTE — Telephone Encounter (Signed)
Coumadin letter 

## 2016-10-26 ENCOUNTER — Other Ambulatory Visit: Payer: Self-pay | Admitting: Internal Medicine

## 2016-10-26 ENCOUNTER — Encounter: Payer: Self-pay | Admitting: Cardiology

## 2016-10-30 ENCOUNTER — Other Ambulatory Visit: Payer: Self-pay | Admitting: Cardiovascular Disease

## 2016-10-30 ENCOUNTER — Telehealth: Payer: Self-pay | Admitting: Physician Assistant

## 2016-10-30 NOTE — Telephone Encounter (Signed)
Received records from [facility] for appointment on 11/06/16 with Rosaria Ferries, PA.  Records put with Rhonda's schedule for 11/06/16. lp

## 2016-10-31 ENCOUNTER — Ambulatory Visit (INDEPENDENT_AMBULATORY_CARE_PROVIDER_SITE_OTHER): Payer: Medicare Other | Admitting: Pharmacist Clinician (PhC)/ Clinical Pharmacy Specialist

## 2016-10-31 DIAGNOSIS — Z5181 Encounter for therapeutic drug level monitoring: Secondary | ICD-10-CM | POA: Diagnosis not present

## 2016-10-31 DIAGNOSIS — Z952 Presence of prosthetic heart valve: Secondary | ICD-10-CM | POA: Diagnosis not present

## 2016-10-31 LAB — PROTIME-INR
INR: 2.9 — AB (ref 0.8–1.2)
PROTHROMBIN TIME: 28 s — AB (ref 9.1–12.0)

## 2016-11-02 LAB — CUP PACEART REMOTE DEVICE CHECK
Date Time Interrogation Session: 20180914121109
Implantable Lead Implant Date: 20150116
Implantable Lead Location: 753859
Implantable Lead Model: 5076
MDC IDC LEAD IMPLANT DT: 20150116
MDC IDC LEAD LOCATION: 753860
MDC IDC PG IMPLANT DT: 20150116
Pulse Gen Model: 2240
Pulse Gen Serial Number: 7587004

## 2016-11-06 ENCOUNTER — Encounter: Payer: Self-pay | Admitting: Physician Assistant

## 2016-11-06 ENCOUNTER — Ambulatory Visit (INDEPENDENT_AMBULATORY_CARE_PROVIDER_SITE_OTHER): Payer: Medicare Other | Admitting: Physician Assistant

## 2016-11-06 VITALS — BP 118/80 | HR 128 | Ht 65.0 in | Wt 254.0 lb

## 2016-11-06 DIAGNOSIS — I442 Atrioventricular block, complete: Secondary | ICD-10-CM | POA: Diagnosis not present

## 2016-11-06 DIAGNOSIS — I6523 Occlusion and stenosis of bilateral carotid arteries: Secondary | ICD-10-CM | POA: Diagnosis not present

## 2016-11-06 DIAGNOSIS — I5032 Chronic diastolic (congestive) heart failure: Secondary | ICD-10-CM
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
## 2016-11-06 NOTE — Patient Instructions (Addendum)
Medication Instructions:  Your physician recommends that you continue on your current medications as directed. Please refer to the Current Medication list given to you today.  Labwork: None   Testing/Procedures: None   Follow-Up: Keep your follow up as scheduled   Any Other Special Instructions Will Be Listed Below (If Applicable). 1. Drink a extra glass today 2. Continue Lasix 3. Keep track of your weight 4. Increase  Activity  If you need a refill on your cardiac medications before your next appointment, please call your pharmacy.

## 2016-11-09 ENCOUNTER — Telehealth: Payer: Self-pay | Admitting: Physician Assistant

## 2016-11-09 NOTE — Telephone Encounter (Signed)
Appears this patient is seen by Dr. Rayann Heman for EP. Routed to [address] for follow up.  Notes recorded by Thompson Grayer, MD on 11/02/2016 at 3:22 PM EDT Remote device check reviewed. Normal device function. Battery status, leads stable. Histograms reviewed and appropriate.  Routine follow-up

## 2016-11-09 NOTE — Telephone Encounter (Signed)
[name] is calling about the pacer maker check that was done on Tuesday , Her heart rate was up and not sure why. She was to receive a call about what they found after checking her pace maker . Please call

## 2016-11-09 NOTE — Telephone Encounter (Signed)
Transmission received. Normal device function. No recent episodes of tachycardia, AF, AT.  [name] made aware and is appreciative.

## 2016-11-09 NOTE — Telephone Encounter (Signed)
Spoke w/ pt and requested that she send a manual transmission w/ her home monitor. Pt verbalized understanding.

## 2016-11-29 ENCOUNTER — Telehealth: Payer: Self-pay | Admitting: Cardiovascular Disease

## 2016-11-29 NOTE — Telephone Encounter (Signed)
New Message  Clara from [facility] would like to get verbal orders for lab work for pt. bs

## 2016-11-29 NOTE — Telephone Encounter (Signed)
Called returned. No answer when called. Will close the encounter.

## 2016-11-29 NOTE — Telephone Encounter (Signed)
Pt is currently in the office and will close at 12.

## 2016-11-30 ENCOUNTER — Other Ambulatory Visit: Payer: Self-pay | Admitting: Cardiovascular Disease

## 2016-12-01 LAB — PROTIME-INR
INR: 3.6 — AB (ref 0.8–1.2)
PROTHROMBIN TIME: 34.6 s — AB (ref 9.1–12.0)

## 2016-12-03 ENCOUNTER — Ambulatory Visit (INDEPENDENT_AMBULATORY_CARE_PROVIDER_SITE_OTHER): Payer: Medicare Other | Admitting: Pharmacist Clinician (PhC)/ Clinical Pharmacy Specialist

## 2016-12-03 DIAGNOSIS — Z952 Presence of prosthetic heart valve: Secondary | ICD-10-CM

## 2016-12-03 DIAGNOSIS — Z5181 Encounter for therapeutic drug level monitoring: Secondary | ICD-10-CM | POA: Diagnosis not present

## 2016-12-04 ENCOUNTER — Encounter: Payer: Self-pay | Admitting: Pharmacist

## 2016-12-04 DIAGNOSIS — Z952 Presence of prosthetic heart valve: Secondary | ICD-10-CM

## 2016-12-04 DIAGNOSIS — Z5181 Encounter for therapeutic drug level monitoring: Secondary | ICD-10-CM

## 2016-12-04 NOTE — Progress Notes (Signed)
This encounter was created in error - please disregard.

## 2016-12-12 ENCOUNTER — Other Ambulatory Visit: Payer: Self-pay | Admitting: *Deleted

## 2016-12-12 DIAGNOSIS — I6523 Occlusion and stenosis of bilateral carotid arteries: Secondary | ICD-10-CM

## 2017-01-08 ENCOUNTER — Other Ambulatory Visit: Payer: Self-pay | Admitting: Cardiovascular Disease

## 2017-01-08 LAB — PROTIME-INR: INR: 3.1 — AB (ref <=1.1)

## 2017-01-09 ENCOUNTER — Ambulatory Visit (INDEPENDENT_AMBULATORY_CARE_PROVIDER_SITE_OTHER): Payer: Medicare Other | Admitting: Pharmacist

## 2017-01-09 DIAGNOSIS — Z952 Presence of prosthetic heart valve: Secondary | ICD-10-CM

## 2017-01-09 DIAGNOSIS — Z5181 Encounter for therapeutic drug level monitoring: Secondary | ICD-10-CM

## 2017-01-09 LAB — PROTIME-INR
INR: 3.1 — AB (ref 0.8–1.2)
Prothrombin Time: 30 s — ABNORMAL HIGH (ref 9.1–12.0)

## 2017-01-15 ENCOUNTER — Telehealth: Payer: Self-pay | Admitting: Cardiology

## 2017-01-15 ENCOUNTER — Ambulatory Visit (INDEPENDENT_AMBULATORY_CARE_PROVIDER_SITE_OTHER): Payer: Medicare Other | Admitting: *Deleted

## 2017-01-15 DIAGNOSIS — I442 Atrioventricular block, complete: Secondary | ICD-10-CM

## 2017-01-15 NOTE — Telephone Encounter (Signed)
Spoke with pt and reminded pt of remote transmission that is due today. Pt verbalized understanding.   

## 2017-01-16 NOTE — Progress Notes (Signed)
Remote pacemaker transmission.   

## 2017-01-18 ENCOUNTER — Encounter: Payer: Self-pay | Admitting: Cardiology

## 2017-01-20 NOTE — Progress Notes (Addendum)
Electrophysiology Office Note Date: 01/23/2017  ID:  Colby, Catanese 1950/03/11, MRN [id number]  PCP: [name], MD Primary Cardiologist: Gwenlyn Found Electrophysiologist: Allred  CC: Pacemaker follow-up  [name] is a 66 y.o. female seen today for Dr Rayann Heman.  She presents today for routine electrophysiology followup.  Since last being seen in our clinic, the patient reports doing reasonably well.  Her shortness of breath is stable. She has chronic arthritic pain. She denies chest pain, palpitations, PND, orthopnea, nausea, vomiting, dizziness, syncope, edema, weight gain, or early satiety.  Device History: STJ dual chamber PPM implanted 2015 for transient complete heart block   Past Medical History:  Diagnosis Date  . Asthma   . CAD (coronary artery disease)    a. normal cors by cath in 03/2015  . Carotid artery disease ([place])   . Chronic anticoagulation   . Complete heart block ([place]) 02/2013   a. Implantation of a dual chamber pacemaker in 02/2013 by Dr Rayann Heman. STJ model number 0867 pacemaker with model number 2088 right ventricular lead.   [name] Dyslipidemia   . H/O Doppler ultrasound 10/03/2010   carotid duplex - R ICA normal patency; L CCA-ICA bypass gradt patent common to interal cartoid bypass graft   . H/O mechanical aortic valve replacement   . History of echocardiogram 11/08/2011   EF >55%; mild-mod concentric LVH; trace aortic regurg.; LA mild-mod dilated  . History of nuclear stress test 01/20/2010   normal pattern of perfusion; low risk scan  . Hypertension   . Lupus (systemic lupus erythematosus) ([place])   . PVD (peripheral vascular disease) ([facility])    60% R renal artery stenosis; non-functioning L kidney   Past Surgical History:  Procedure Laterality Date  . AORTIC VALVE REPLACEMENT  11/29/006   [facility]; [place]; on coumadin  . CARDIAC CATHETERIZATION  11/01/2004   define anatomy   . CARDIAC CATHETERIZATION N/A 04/19/2015   Procedure: Coronary Angiography;   Surgeon: Peter M [name], MD;  Location: [facility] CV LAB;  Service: Cardiovascular;  Laterality: N/A;  . CAROTID ENDARTERECTOMY     lt.  . CHOLECYSTECTOMY  1999  . EXPLORATORY LAPAROTOMY  04/12/2012   small bowel perf; ileostomy & R hemicolectomy   . PACEMAKER INSERTION  03/06/2013   SJM Assurity DR PPM implanted by Dr Rayann Heman for complete heart block  . PERMANENT PACEMAKER INSERTION N/A 03/06/2013   Procedure: PERMANENT PACEMAKER INSERTION;  Surgeon: Coralyn Mark, MD;  Location: [facility] CATH LAB;  Service: Cardiovascular;  Laterality: N/A;  . TEMPORARY PACEMAKER INSERTION N/A 03/06/2013   Procedure: TEMPORARY PACEMAKER INSERTION;  Surgeon: Coralyn Mark, MD;  Location: [facility] CATH LAB;  Service: Cardiovascular;  Laterality: N/A;  . TUBAL LIGATION  11/1982    Current Outpatient Medications  Medication Sig Dispense Refill  . acetaminophen (TYLENOL) 325 MG tablet Take 650 mg by mouth every 6 (six) hours as needed for moderate pain.     . Calcium Carbonate (CALTRATE 600) 1500 MG TABS Take 1 tablet by mouth 2 (two) times daily.      . chlorhexidine (PERIDEX) 0.12 % solution USES ONCE EVERY FEW WEEKS IN WATER PIK AS DIRECTED  5  . cholecalciferol (VITAMIN D) 1000 UNITS tablet Take 2,000 Units by mouth daily.     . febuxostat (ULORIC) 40 MG tablet Take 1 tablet by mouth every other day.     . Fluticasone-Salmeterol (ADVAIR DISKUS) 250-50 MCG/DOSE AEPB Use 1 inhalation by mouth  two times daily 180 each 3  .  furosemide (LASIX) 40 MG tablet Take 1 tablet (40 mg total) by mouth daily. 30 tablet   . hydroxychloroquine (PLAQUENIL) 200 MG tablet Take 200 mg by mouth daily.    . isosorbide mononitrate (IMDUR) 30 MG 24 hr tablet TAKE 1 TABLET BY MOUTH  DAILY 90 tablet 3  . loratadine (CLARITIN) 10 MG tablet Take 10 mg by mouth daily.    . metoprolol succinate (TOPROL-XL) 50 MG 24 hr tablet Take 1 tablet (50 mg total) by mouth daily. Take with or immediately following a meal. 90 tablet 2  . predniSONE  (DELTASONE) 5 MG tablet Take 5 mg by mouth daily.      . Probiotic Product (PROBIOTIC DAILY PO) Take 1 tablet by mouth daily.    . simvastatin (ZOCOR) 20 MG tablet Take 1 tablet by mouth at  bedtime 90 tablet 0  . warfarin (COUMADIN) 5 MG tablet TAKE 1 TO 1 AND 1/2 TABLETS BY MOUTH DAILY AS DIRECTED  BY COUMADIN CLINIC 135 tablet 1  . albuterol (PROVENTIL HFA;VENTOLIN HFA) 108 (90 Base) MCG/ACT inhaler Inhale 1 puff into the lungs as needed.     No current facility-administered medications for this visit.     Allergies:   Patient has no known allergies.   Social History: Social History   Socioeconomic History  . Marital status: Divorced    Spouse name: Not on file  . Number of children: 3  . Years of education: Not on file  . Highest education level: Not on file  Social Needs  . Financial resource strain: Not on file  . Food insecurity - worry: Not on file  . Food insecurity - inability: Not on file  . Transportation needs - medical: Not on file  . Transportation needs - non-medical: Not on file  Occupational History  . Occupation: Retired  Tobacco Use  . Smoking status: Never Smoker  . Smokeless tobacco: Never Used  Substance and Sexual Activity  . Alcohol use: No  . Drug use: No  . Sexual activity: Not on file  Other Topics Concern  . Not on file  Social History Narrative   Lives alone in [place].    Family History: Family History  Problem Relation Age of Onset  . Alzheimer's disease Father   . Cirrhosis Father   . Cancer Father        prostate  . Heart attack Neg Hx      Review of Systems: All other systems reviewed and are otherwise negative except as noted above.   Physical Exam: VS:  BP 122/78   Pulse 72   Ht 5\' 5"  (1.651 m)   Wt 268 lb 12.8 oz (121.9 kg)   SpO2 98%   BMI 44.73 kg/m  , BMI Body mass index is 44.73 kg/m.  GEN- The patient is obese appearing, alert and oriented x 3 today.   HEENT: normocephalic, atraumatic; sclera clear,  conjunctiva pink; hearing intact; oropharynx clear; neck supple  Lungs- Clear to ausculation bilaterally, normal work of breathing.  No wheezes, rales, rhonchi Heart- Regular rate and rhythm (paced) GI- soft, non-tender, non-distended, bowel sounds present  Extremities- no clubbing, cyanosis, or edema  MS- no significant deformity or atrophy Skin- warm and dry, no rash or lesion; PPM pocket well healed Psych- euthymic mood, full affect Neuro- strength and sensation are intact  PPM Interrogation- reviewed in detail today,  See PACEART report  EKG:  EKG is not ordered today.   Recent Labs: No results found for requested  labs within last 8760 hours.   Wt Readings from Last 3 Encounters:  01/23/17 268 lb 12.8 oz (121.9 kg)  11/06/16 254 lb (115.2 kg)  08/01/16 248 lb 6.4 oz (112.7 kg)     Other studies Reviewed: Additional studies/ records that were reviewed today include: Dr Rayann Heman and Dr Kennon Holter office notes  Assessment and Plan:  1.  Transient complete heart block Normal PPM function See Pace Art report No changes today  2.  Chronic diastolic heart failure Euvolemic on exam Continue current therapy  3.  S/p mechanical AVR Continue Warfarin No bleeding issues Echo 2018 stable   4. Obesity Body mass index is 44.73 kg/m. Weight loss advised   5.  NSVT Seen on PPM interrogation today Asymptomatic EF normal Will follow   Current medicines are reviewed at length with the patient today.   The patient does not have concerns regarding her medicines.  The following changes were made today:  none  Labs/ tests ordered today include: none  Disposition:   Follow up with Merlin transmissions, Dr Gwenlyn Found as scheduled, Dr Rayann Heman in 1 year    Signed, Chanetta Marshall, NP 01/23/2017 12:00 PM  [facility] [address] [place] [address] [phone] (office) [phone] (fax)

## 2017-01-23 ENCOUNTER — Encounter: Payer: Self-pay | Admitting: Nurse Practitioner

## 2017-01-23 ENCOUNTER — Encounter (INDEPENDENT_AMBULATORY_CARE_PROVIDER_SITE_OTHER): Payer: Self-pay

## 2017-01-23 ENCOUNTER — Ambulatory Visit: Payer: Medicare Other | Admitting: Nurse Practitioner

## 2017-01-23 VITALS — BP 122/78 | HR 72 | Ht 65.0 in | Wt 268.8 lb

## 2017-01-23 DIAGNOSIS — I5032 Chronic diastolic (congestive) heart failure: Secondary | ICD-10-CM

## 2017-01-23 DIAGNOSIS — I359 Nonrheumatic aortic valve disorder, unspecified: Secondary | ICD-10-CM | POA: Diagnosis not present

## 2017-01-23 DIAGNOSIS — I442 Atrioventricular block, complete: Secondary | ICD-10-CM

## 2017-01-23 LAB — CUP PACEART INCLINIC DEVICE CHECK
Date Time Interrogation Session: 20181205121156
Implantable Lead Implant Date: 20150116
Implantable Lead Location: 753859
Implantable Lead Location: 753860
Implantable Lead Model: 5076
MDC IDC LEAD IMPLANT DT: 20150116
MDC IDC PG IMPLANT DT: 20150116
Pulse Gen Model: 2240
Pulse Gen Serial Number: 7587004

## 2017-01-23 NOTE — Patient Instructions (Addendum)
Medication Instructions:  Your physician recommends that you continue on your current medications as directed. Please refer to the Current Medication list given to you today.   If you need a refill on your cardiac medications before your next appointment, please call your pharmacy.  Labwork: NONE ORDERED  Testing/Procedures: NONE ORDERED  Follow-Up: Remote monitoring is used to monitor your Pacemaker of ICD from home. This monitoring reduces the number of office visits required to check your device to one time per year. It allows [place] to keep an eye on the functioning of your device to ensure it is working properly. You are scheduled for a device check from home on 04/24/2017. You may send your transmission at any time that day. If you have a wireless device, the transmission will be sent automatically. After your physician reviews your transmission, you will receive a postcard with your next transmission date.  Your physician wants you to follow-up in: [place].  You will receive a reminder letter in the mail two months in advance. If you don't receive a letter, please call our office to schedule the follow-up appointment.   Any Other Special Instructions Will Be Listed Below (If Applicable).

## 2017-01-28 LAB — CUP PACEART REMOTE DEVICE CHECK
Battery Remaining Longevity: 128 mo
Battery Remaining Percentage: 95.5 %
Brady Statistic AP VS Percent: 1 %
Brady Statistic AS VP Percent: 79 %
Brady Statistic RV Percent Paced: 99 %
Date Time Interrogation Session: 20181127092734
Implantable Lead Location: 753860
Implantable Lead Model: 5076
Lead Channel Pacing Threshold Amplitude: 0.75 V
Lead Channel Sensing Intrinsic Amplitude: 12 mV
Lead Channel Setting Pacing Amplitude: 2 V
Lead Channel Setting Pacing Pulse Width: 0.4 ms
Lead Channel Setting Sensing Sensitivity: 2 mV
MDC IDC LEAD IMPLANT DT: 20150116
MDC IDC LEAD IMPLANT DT: 20150116
MDC IDC LEAD LOCATION: 753859
MDC IDC MSMT BATTERY VOLTAGE: 2.99 V
MDC IDC MSMT LEADCHNL RA IMPEDANCE VALUE: 400 Ohm
MDC IDC MSMT LEADCHNL RA PACING THRESHOLD AMPLITUDE: 0.75 V
MDC IDC MSMT LEADCHNL RA PACING THRESHOLD PULSEWIDTH: 0.4 ms
MDC IDC MSMT LEADCHNL RA SENSING INTR AMPL: 5 mV
MDC IDC MSMT LEADCHNL RV IMPEDANCE VALUE: 590 Ohm
MDC IDC MSMT LEADCHNL RV PACING THRESHOLD PULSEWIDTH: 0.4 ms
MDC IDC PG IMPLANT DT: 20150116
MDC IDC SET LEADCHNL RV PACING AMPLITUDE: 1 V
MDC IDC STAT BRADY AP VP PERCENT: 20 %
MDC IDC STAT BRADY AS VS PERCENT: 1 %
MDC IDC STAT BRADY RA PERCENT PACED: 20 %
Pulse Gen Model: 2240
Pulse Gen Serial Number: 7587004

## 2017-02-14 ENCOUNTER — Other Ambulatory Visit: Payer: Self-pay | Admitting: Cardiovascular Disease

## 2017-02-14 LAB — PROTIME-INR: INR: 4.3 — AB (ref <=1.1)

## 2017-02-15 ENCOUNTER — Other Ambulatory Visit: Payer: Self-pay | Admitting: Cardiovascular Disease

## 2017-02-15 ENCOUNTER — Ambulatory Visit (INDEPENDENT_AMBULATORY_CARE_PROVIDER_SITE_OTHER): Payer: Medicare Other | Admitting: Pharmacist Clinician (PhC)/ Clinical Pharmacy Specialist

## 2017-02-15 DIAGNOSIS — Z952 Presence of prosthetic heart valve: Secondary | ICD-10-CM | POA: Diagnosis not present

## 2017-02-15 DIAGNOSIS — Z5181 Encounter for therapeutic drug level monitoring: Secondary | ICD-10-CM | POA: Diagnosis not present

## 2017-02-15 DIAGNOSIS — I472 Ventricular tachycardia: Secondary | ICD-10-CM

## 2017-02-15 DIAGNOSIS — I4729 Other ventricular tachycardia: Secondary | ICD-10-CM

## 2017-02-15 LAB — PROTIME-INR
INR: 4.3 — AB (ref 0.8–1.2)
PROTHROMBIN TIME: 41.1 s — AB (ref 9.1–12.0)

## 2017-02-20 ENCOUNTER — Other Ambulatory Visit: Payer: Self-pay | Admitting: Cardiovascular Disease

## 2017-02-28 ENCOUNTER — Other Ambulatory Visit: Payer: Self-pay | Admitting: Cardiovascular Disease

## 2017-03-01 ENCOUNTER — Ambulatory Visit (INDEPENDENT_AMBULATORY_CARE_PROVIDER_SITE_OTHER): Payer: Medicare Other | Admitting: Pharmacist

## 2017-03-01 DIAGNOSIS — Z5181 Encounter for therapeutic drug level monitoring: Secondary | ICD-10-CM

## 2017-03-01 DIAGNOSIS — Z952 Presence of prosthetic heart valve: Secondary | ICD-10-CM

## 2017-03-01 LAB — PROTIME-INR
INR: 2.6 — ABNORMAL HIGH (ref 0.8–1.2)
Prothrombin Time: 25.5 s — ABNORMAL HIGH (ref 9.1–12.0)

## 2017-04-04 ENCOUNTER — Other Ambulatory Visit: Payer: Self-pay | Admitting: Cardiovascular Disease

## 2017-04-05 ENCOUNTER — Ambulatory Visit (INDEPENDENT_AMBULATORY_CARE_PROVIDER_SITE_OTHER): Payer: Medicare Other | Admitting: Pharmacist

## 2017-04-05 DIAGNOSIS — Z5181 Encounter for therapeutic drug level monitoring: Secondary | ICD-10-CM | POA: Diagnosis not present

## 2017-04-05 DIAGNOSIS — Z952 Presence of prosthetic heart valve: Secondary | ICD-10-CM

## 2017-04-05 LAB — PROTIME-INR
INR: 1.8 — AB (ref 0.8–1.2)
Prothrombin Time: 18.5 s — ABNORMAL HIGH (ref 9.1–12.0)

## 2017-04-16 ENCOUNTER — Encounter: Payer: Medicare Other | Admitting: *Deleted

## 2017-04-17 ENCOUNTER — Encounter: Payer: Self-pay | Admitting: Cardiology

## 2017-04-17 ENCOUNTER — Other Ambulatory Visit: Payer: Self-pay | Admitting: Cardiovascular Disease

## 2017-04-18 LAB — PROTIME-INR
INR: 2.3 — ABNORMAL HIGH (ref 0.8–1.2)
Prothrombin Time: 22.9 s — ABNORMAL HIGH (ref 9.1–12.0)

## 2017-04-19 ENCOUNTER — Ambulatory Visit (INDEPENDENT_AMBULATORY_CARE_PROVIDER_SITE_OTHER): Payer: Medicare Other | Admitting: Pharmacist

## 2017-04-19 ENCOUNTER — Encounter: Payer: Self-pay | Admitting: Cardiovascular Disease

## 2017-04-19 ENCOUNTER — Ambulatory Visit: Payer: Medicare Other | Admitting: Cardiovascular Disease

## 2017-04-19 DIAGNOSIS — E7800 Pure hypercholesterolemia, unspecified: Secondary | ICD-10-CM

## 2017-04-19 DIAGNOSIS — Z952 Presence of prosthetic heart valve: Secondary | ICD-10-CM | POA: Diagnosis not present

## 2017-04-19 DIAGNOSIS — I5031 Acute diastolic (congestive) heart failure: Secondary | ICD-10-CM

## 2017-04-19 DIAGNOSIS — Z5181 Encounter for therapeutic drug level monitoring: Secondary | ICD-10-CM

## 2017-04-19 DIAGNOSIS — I10 Essential (primary) hypertension: Secondary | ICD-10-CM

## 2017-04-19 DIAGNOSIS — I442 Atrioventricular block, complete: Secondary | ICD-10-CM | POA: Diagnosis not present

## 2017-04-19 LAB — POCT INR: INR: 2.7

## 2017-04-19 NOTE — Assessment & Plan Note (Signed)
Status post St. Jude AVR by Dr. [name] in September 2006 with normal coronary arteries demonstrated by cardiac catheterization again 04/19/15. We check her echo revealed basis last checked 05/31/16 revealing normal LV systolic function with a normally functioning aortic mechanical prosthesis, moderate MR and mild pulmonary hypertension. There was severe TR noted however. She is on Coumadin anticoagulation.

## 2017-04-19 NOTE — Progress Notes (Signed)
04/19/2017 LILIANN FILE   Jan 28, 1951  [id number]  Primary Physician [name], MD Primary Cardiologist: [name] MD FACP, [place], [place], [name]  HPI:  MAYARI MATUS is a 67 y.o.  moderately overweight, almost divorced Caucasian female mother of 39, grandmother to 2 grandchildren who I last saw in the office  08/01/16. She had a St. Jude AVR performed by Dr. Ceasar Mons September of 2006 because of severe aortic insufficiency. She had normal coronary arteries by cath at that time. A look at her renal arteries and demonstrated a 60% right renal artery stenosis and patent accessory left renal arteries. By duplex she does have a non-functioning left kidney. Her other problems include hypertension and hyperlipidemia. She also has a history of lupus followed by Dr. Jalene Mullet. An echo performed a year ago showed a well-functioning aortic prosthesis, and recent renal Dopplers revealed a widely patent right renal artery. She had a small bowel obstruction and underwent surgery at [facility] for perforated small bowel. She had an exploratory laparotomy with ileostomy and a right hemicolectomy 04/12/12. She saw [name] PAC back in our office 06/12/12 and her medicines were adjusted.  She was admitted in early January for symptomatic complete heart block underwent permanent pacemaker insertion by Dr. Thompson Grayer.[name] She was remitted approximately week later with orthostatic hypotension and was dehydrated. She responded to fluid resuscitation.  Since I saw her a year ago she was admitted with heart failure requiring diuresis secondary to diastolic dysfunction and dietary indiscretion. In addition, she recently underwent an cardiac catheterization by Dr. [name] because of chest pain 04/19/15 revealing normal coronary arteries. She was admitted to [facility] of volume overload 12/21/15 and was diuresed Since I saw her 9 months ago she's remained stable. She has seen Dr. Rayann Heman in the office  and her pacemaker is functioning well. She watches her salt intake and has no peripheral edema. Her PT INRs are checked here in the office on a regular basis. Her last echo performed 05/31/16 revealed normal LV systolic function with a well functioning aortic mechanical prosthesis, moderate MR and severe TR with mild pulmonary hypertension.      Current Meds  Medication Sig  . acetaminophen (TYLENOL) 325 MG tablet Take 650 mg by mouth every 6 (six) hours as needed for moderate pain.   . Calcium Carbonate (CALTRATE 600) 1500 MG TABS Take 1 tablet by mouth 2 (two) times daily.    . chlorhexidine (PERIDEX) 0.12 % solution USES ONCE EVERY FEW WEEKS IN WATER PIK AS DIRECTED  . cholecalciferol (VITAMIN D) 1000 UNITS tablet Take 2,000 Units by mouth daily.   . febuxostat (ULORIC) 40 MG tablet Take 1 tablet by mouth every other day.   . Fluticasone-Salmeterol (ADVAIR DISKUS) 250-50 MCG/DOSE AEPB Use 1 inhalation by mouth  two times daily  . furosemide (LASIX) 40 MG tablet Take 1 tablet (40 mg total) by mouth daily.  . hydroxychloroquine (PLAQUENIL) 200 MG tablet Take 200 mg by mouth daily.  . isosorbide mononitrate (IMDUR) 30 MG 24 hr tablet TAKE 1 TABLET BY MOUTH  DAILY  . loratadine (CLARITIN) 10 MG tablet Take 10 mg by mouth daily.  . metoprolol succinate (TOPROL-XL) 50 MG 24 hr tablet TAKE 1 TABLET BY MOUTH  DAILY - TAKE WITH OR  IMMEDIATELY FOLLOWING A  MEAL  . predniSONE (DELTASONE) 5 MG tablet Take 5 mg by mouth daily.    . Probiotic Product (PROBIOTIC DAILY PO) Take 1 tablet by mouth  daily.  . simvastatin (ZOCOR) 20 MG tablet Take 1 tablet by mouth at  bedtime  . warfarin (COUMADIN) 5 MG tablet TAKE 1 TO 1 AND 1/2 TABLETS BY MOUTH DAILY AS DIRECTED  BY COUMADIN CLINIC     No Known Allergies  Social History   Socioeconomic History  . Marital status: Divorced    Spouse name: Not on file  . Number of children: 3  . Years of education: Not on file  . Highest education level: Not on file   Social Needs  . Financial resource strain: Not on file  . Food insecurity - worry: Not on file  . Food insecurity - inability: Not on file  . Transportation needs - medical: Not on file  . Transportation needs - non-medical: Not on file  Occupational History  . Occupation: Retired  Tobacco Use  . Smoking status: Never Smoker  . Smokeless tobacco: Never Used  Substance and Sexual Activity  . Alcohol use: No  . Drug use: No  . Sexual activity: Not on file  Other Topics Concern  . Not on file  Social History Narrative   Lives alone in [place].     Review of Systems: General: negative for chills, fever, night sweats or weight changes.  Cardiovascular: negative for chest pain, dyspnea on exertion, edema, orthopnea, palpitations, paroxysmal nocturnal dyspnea or shortness of breath Dermatological: negative for rash Respiratory: negative for cough or wheezing Urologic: negative for hematuria Abdominal: negative for nausea, vomiting, diarrhea, bright red blood per rectum, melena, or hematemesis Neurologic: negative for visual changes, syncope, or dizziness All other systems reviewed and are otherwise negative except as noted above.    Blood pressure (!) 143/82, pulse 72, height 5\' 5"  (1.651 m), weight 267 lb 3.2 oz (121.2 kg).  General appearance: alert and no distress Neck: no adenopathy, no carotid bruit, no JVD, supple, symmetrical, trachea midline and thyroid not enlarged, symmetric, no tenderness/mass/nodules Lungs: clear to auscultation bilaterally Heart: Crisp prosthetic aortic valve sounds Extremities: extremities normal, atraumatic, no cyanosis or edema Pulses: 2+ and symmetric Skin: Skin color, texture, turgor normal. No rashes or lesions Neurologic: Alert and oriented X 3, normal strength and tone. Normal symmetric reflexes. Normal coordination and gait  EKG not performed today  ASSESSMENT AND PLAN:   S/P AVR (aortic valve replacement) Status post St. Jude AVR  by Dr. [name] in September 2006 with normal coronary arteries demonstrated by cardiac catheterization again 04/19/15. We check her echo revealed basis last checked 05/31/16 revealing normal LV systolic function with a normally functioning aortic mechanical prosthesis, moderate MR and mild pulmonary hypertension. There was severe TR noted however. She is on Coumadin anticoagulation.  Hyperlipidemia History of hyperlipidemia on statin therapy followed by her PCP  Essential hypertension History of essential hypertension blood pressure measured at 143/82. She is on metoprolol. Continue current meds at current dosing.  Complete heart block (HCC) History of complete heart block status post permanent transvenous pacemaker insertion by Dr. Rayann Heman who follows this.  Acute congestive heart failure with left ventricular diastolic dysfunction (HCC) History of multiple admissions for diastolic dysfunction and heart failure with diuresis probably secondary to dietary indiscretion. She was on spironolactone which is discontinued because of hyperkalemia. She avoids salt and has no peripheral edema.      [name] MD FACP,FACC,FAHA, [facility] 04/19/2017 10:47 AM

## 2017-04-19 NOTE — Assessment & Plan Note (Signed)
History of essential hypertension blood pressure measured at 143/82. She is on metoprolol. Continue current meds at current dosing.

## 2017-04-19 NOTE — Patient Instructions (Signed)
Medication Instructions: Your physician recommends that you continue on your current medications as directed. Please refer to the Current Medication list given to you today.  Labwork: PT-INR check today.  Testing/Procedures: Your physician has requested that you have an echocardiogram in 1 month. Echocardiography is a painless test that uses sound waves to create images of your heart. It provides your doctor with information about the size and shape of your heart and how well your heart's chambers and valves are working. This procedure takes approximately one hour. There are no restrictions for this procedure.   Follow-Up: We request that you follow-up in: 6 months with Rosaria Ferries, PA and in 12 months with Dr Andria Rhein will receive a reminder letter in the mail two months in advance. If you don't receive a letter, please call our office to schedule the follow-up appointment.

## 2017-04-19 NOTE — Assessment & Plan Note (Signed)
History of hyperlipidemia on statin therapy followed by her PCP. 

## 2017-04-19 NOTE — Assessment & Plan Note (Signed)
History of complete heart block status post permanent transvenous pacemaker insertion by Dr. Rayann Heman who follows this.

## 2017-04-19 NOTE — Assessment & Plan Note (Signed)
History of multiple admissions for diastolic dysfunction and heart failure with diuresis probably secondary to dietary indiscretion. She was on spironolactone which is discontinued because of hyperkalemia. She avoids salt and has no peripheral edema.

## 2017-05-17 ENCOUNTER — Other Ambulatory Visit: Payer: Self-pay | Admitting: Internal Medicine

## 2017-05-17 ENCOUNTER — Telehealth: Payer: Self-pay | Admitting: Internal Medicine

## 2017-05-17 NOTE — Telephone Encounter (Signed)
Called and spoke with pt letting her know we needed to schedule her for an OV before we could send Rx in since it has been since 04/22/15 since she had been at the office.  Pt expressed understanding. OV scheduled for pt with TP Thursday, 4/4 at 4:15.  Nothing further needed at this time.

## 2017-05-23 ENCOUNTER — Encounter: Payer: Self-pay | Admitting: Adult Health

## 2017-05-23 ENCOUNTER — Ambulatory Visit: Payer: Medicare Other | Admitting: Adult Health

## 2017-05-23 DIAGNOSIS — J4520 Mild intermittent asthma, uncomplicated: Secondary | ICD-10-CM | POA: Diagnosis not present

## 2017-05-23 NOTE — Assessment & Plan Note (Signed)
Well controlled on present regimen   Plan  Patient Instructions  Continue on current regimen  Follow up with Dr. Annamaria Boots  In 1 year and As needed

## 2017-05-23 NOTE — Progress Notes (Signed)
@Patient  ID: [name], female    DOB: 1950/04/24, 67 y.o.   MRN: [id number]  Chief Complaint  Patient presents with  . Follow-up    Asthma     Referring provider: [name], MD  HPI: 67 yo female never smoker with asthma  Has Lupus on plaquenil and pred 5mg  daily  S/p AVR , pacemaker , on coumadin.   TES T Echo 11/2014 with EF 50-55%. PAP  87mmHg.  2017 Spirometry today reviewecd with pt with FEV1 58%, ratio 83, FVC 55%.    05/23/2017 Follow up : Asthma  Patient presents for a follow-up.  She was last seen in 2017.  Says overall her asthma is been doing well.  She denies any flare of cough or wheezing. She remains on Advair twice daily. Rare use of albuterol .   Has allergic rhinitis  On claritin . Does have more drainage since pollen is high .   She does have lupus and is on Plaquenil and prednisone 5 mg daily  Just returned from vacation from [place]. Had a great time . More fluid retention, feels from eating out so much .    No Known Allergies  Immunization History  Administered Date(s) Administered  . Influenza Split 11/06/2011, 12/20/2016  . Influenza,inj,Quad PF,6+ Mos 11/06/2012, 11/25/2013, 12/09/2014  . Pneumococcal Polysaccharide-23 04/19/2012    Past Medical History:  Diagnosis Date  . Asthma   . CAD (coronary artery disease)    a. normal cors by cath in 03/2015  . Carotid artery disease ([place])   . Chronic anticoagulation   . Complete heart block ([place]) 02/2013   a. Implantation of a dual chamber pacemaker in 02/2013 by Dr Rayann Heman. STJ model number 6301 pacemaker with model number 2088 right ventricular lead.   [name] Dyslipidemia   . H/O Doppler ultrasound 10/03/2010   carotid duplex - R ICA normal patency; L CCA-ICA bypass gradt patent common to interal cartoid bypass graft   . H/O mechanical aortic valve replacement   . History of echocardiogram 11/08/2011   EF >55%; mild-mod concentric LVH; trace aortic regurg.; LA mild-mod dilated  . History of  nuclear stress test 01/20/2010   normal pattern of perfusion; low risk scan  . Hypertension   . Lupus (systemic lupus erythematosus) ([place])   . PVD (peripheral vascular disease) (HCC)    60% R renal artery stenosis; non-functioning L kidney    Tobacco History: Social History   Tobacco Use  Smoking Status Never Smoker  Smokeless Tobacco Never Used   Counseling given: Not Answered   Outpatient Encounter Medications as of 05/23/2017  Medication Sig  . acetaminophen (TYLENOL) 325 MG tablet Take 650 mg by mouth every 6 (six) hours as needed for moderate pain.   . Calcium Carbonate (CALTRATE 600) 1500 MG TABS Take 1 tablet by mouth 2 (two) times daily.    . chlorhexidine (PERIDEX) 0.12 % solution USES ONCE EVERY FEW WEEKS IN WATER PIK AS DIRECTED  . cholecalciferol (VITAMIN D) 1000 UNITS tablet Take 2,000 Units by mouth daily.   . febuxostat (ULORIC) 40 MG tablet Take 1 tablet by mouth every other day.   . Fluticasone-Salmeterol (ADVAIR DISKUS) 250-50 MCG/DOSE AEPB USE 1 INHALATION TWO TIMES  DAILY  . furosemide (LASIX) 40 MG tablet Take 1 tablet (40 mg total) by mouth daily.  . hydroxychloroquine (PLAQUENIL) 200 MG tablet Take 200 mg by mouth daily.  . isosorbide mononitrate (IMDUR) 30 MG 24 hr tablet TAKE 1 TABLET BY MOUTH  DAILY  .  loratadine (CLARITIN) 10 MG tablet Take 10 mg by mouth daily.  . metoprolol succinate (TOPROL-XL) 50 MG 24 hr tablet TAKE 1 TABLET BY MOUTH  DAILY - TAKE WITH OR  IMMEDIATELY FOLLOWING A  MEAL  . predniSONE (DELTASONE) 5 MG tablet Take 5 mg by mouth daily.    . Probiotic Product (PROBIOTIC DAILY PO) Take 1 tablet by mouth daily.  . simvastatin (ZOCOR) 20 MG tablet Take 1 tablet by mouth at  bedtime  . warfarin (COUMADIN) 5 MG tablet TAKE 1 TO 1 AND 1/2 TABLETS BY MOUTH DAILY AS DIRECTED  BY COUMADIN CLINIC   No facility-administered encounter medications on file as of 05/23/2017.      Review of Systems  Constitutional:   No  weight loss, night sweats,   Fevers, chills, fatigue, or  lassitude.  HEENT:   No headaches,  Difficulty swallowing,  Tooth/dental problems, or  Sore throat,                No sneezing, itching, ear ache,  +nasal congestion, post nasal drip,   CV:  No chest pain,  Orthopnea, PND, swelling in lower extremities, anasarca, dizziness, palpitations, syncope.   GI  No heartburn, indigestion, abdominal pain, nausea, vomiting, diarrhea, change in bowel habits, loss of appetite, bloody stools.   Resp:    No chest wall deformity  Skin: no rash or lesions.  GU: no dysuria, change in color of urine, no urgency or frequency.  No flank pain, no hematuria   MS:  No joint pain or swelling.  No decreased range of motion.  No back pain.    Physical Exam  BP 116/84 (BP Location: Left Arm, Cuff Size: Normal)   Pulse 75   Ht 5\' 5"  (1.651 m)   Wt 278 lb 6.4 oz (126.3 kg)   SpO2 96%   BMI 46.33 kg/m   GEN: A/Ox3; pleasant , NAD, obese    HEENT:  [place]/AT,  EACs-clear, TMs-wnl, NOSE-clear, THROAT-clear, no lesions, no postnasal drip or exudate noted.   NECK:  Supple w/ fair ROM; no JVD; normal carotid impulses w/o bruits; no thyromegaly or nodules palpated; no lymphadenopathy.    RESP  Clear  P & A; w/o, wheezes/ rales/ or rhonchi. no accessory muscle use, no dullness to percussion  CARD:  RRR, no m/r/g, 1+ peripheral edema, pulses intact, no cyanosis or clubbing.  GI:   Soft & nt; nml bowel sounds; no organomegaly or masses detected.   Musco: Warm bil, no deformities or joint swelling noted.   Neuro: alert, no focal deficits noted.    Skin: Warm, no lesions or rashes    Lab Results:  BMET  BNP  ProBNP  Imaging: No results found.   Assessment & Plan:   Asthma, mild intermittent Well controlled on present regimen   Plan  Patient Instructions  Continue on current regimen  Follow up with Dr. Annamaria Boots  In 1 year and As needed           Rexene Edison, NP 05/23/2017

## 2017-05-23 NOTE — Patient Instructions (Addendum)
Continue on current regimen  May add Flonase 2 puff daily As needed   Follow up with Dr. Annamaria Boots  In 1 year and As needed

## 2017-05-27 ENCOUNTER — Other Ambulatory Visit: Payer: Self-pay | Admitting: Cardiovascular Disease

## 2017-05-28 ENCOUNTER — Ambulatory Visit (HOSPITAL_BASED_OUTPATIENT_CLINIC_OR_DEPARTMENT_OTHER)
Admission: RE | Admit: 2017-05-28 | Discharge: 2017-05-28 | Disposition: A | Discharge status: Home / Self Care | Payer: Medicare Other | Source: Ambulatory Visit | Attending: Cardiovascular Disease | Admitting: Cardiovascular Disease

## 2017-05-28 ENCOUNTER — Ambulatory Visit (HOSPITAL_COMMUNITY)
Admission: RE | Admit: 2017-05-28 | Discharge: 2017-05-28 | Disposition: A | Discharge status: Home / Self Care | Payer: Medicare Other | Source: Ambulatory Visit | Attending: Cardiology | Admitting: Cardiology

## 2017-05-28 ENCOUNTER — Ambulatory Visit (INDEPENDENT_AMBULATORY_CARE_PROVIDER_SITE_OTHER): Payer: Medicare Other | Admitting: Pharmacist Clinician (PhC)/ Clinical Pharmacy Specialist

## 2017-05-28 DIAGNOSIS — E669 Obesity, unspecified: Secondary | ICD-10-CM | POA: Diagnosis not present

## 2017-05-28 DIAGNOSIS — I701 Atherosclerosis of renal artery: Secondary | ICD-10-CM | POA: Insufficient documentation

## 2017-05-28 DIAGNOSIS — I6523 Occlusion and stenosis of bilateral carotid arteries: Secondary | ICD-10-CM | POA: Diagnosis not present

## 2017-05-28 DIAGNOSIS — E785 Hyperlipidemia, unspecified: Secondary | ICD-10-CM | POA: Insufficient documentation

## 2017-05-28 DIAGNOSIS — Z6841 Body Mass Index (BMI) 40.0 and over, adult: Secondary | ICD-10-CM | POA: Diagnosis not present

## 2017-05-28 DIAGNOSIS — Z952 Presence of prosthetic heart valve: Secondary | ICD-10-CM

## 2017-05-28 DIAGNOSIS — Z5181 Encounter for therapeutic drug level monitoring: Secondary | ICD-10-CM | POA: Diagnosis not present

## 2017-05-28 DIAGNOSIS — I774 Celiac artery compression syndrome: Secondary | ICD-10-CM | POA: Insufficient documentation

## 2017-05-28 DIAGNOSIS — I10 Essential (primary) hypertension: Secondary | ICD-10-CM | POA: Insufficient documentation

## 2017-05-28 DIAGNOSIS — N261 Atrophy of kidney (terminal): Secondary | ICD-10-CM | POA: Insufficient documentation

## 2017-05-28 LAB — POCT INR: INR: 3

## 2017-05-28 LAB — PROTIME-INR
INR: 3 — ABNORMAL HIGH (ref 0.8–1.2)
PROTHROMBIN TIME: 29.7 s — AB (ref 9.1–12.0)

## 2017-05-30 ENCOUNTER — Other Ambulatory Visit: Payer: Self-pay

## 2017-05-30 DIAGNOSIS — I701 Atherosclerosis of renal artery: Secondary | ICD-10-CM

## 2017-05-30 DIAGNOSIS — I6523 Occlusion and stenosis of bilateral carotid arteries: Secondary | ICD-10-CM

## 2017-06-03 ENCOUNTER — Ambulatory Visit (HOSPITAL_COMMUNITY): Payer: Medicare Other | Attending: Cardiology

## 2017-06-03 ENCOUNTER — Other Ambulatory Visit: Payer: Self-pay

## 2017-06-03 DIAGNOSIS — I272 Pulmonary hypertension, unspecified: Secondary | ICD-10-CM | POA: Insufficient documentation

## 2017-06-03 DIAGNOSIS — E669 Obesity, unspecified: Secondary | ICD-10-CM | POA: Insufficient documentation

## 2017-06-03 DIAGNOSIS — E785 Hyperlipidemia, unspecified: Secondary | ICD-10-CM | POA: Diagnosis not present

## 2017-06-03 DIAGNOSIS — Z48812 Encounter for surgical aftercare following surgery on the circulatory system: Secondary | ICD-10-CM | POA: Diagnosis not present

## 2017-06-03 DIAGNOSIS — Z952 Presence of prosthetic heart valve: Secondary | ICD-10-CM | POA: Insufficient documentation

## 2017-06-03 DIAGNOSIS — I359 Nonrheumatic aortic valve disorder, unspecified: Secondary | ICD-10-CM | POA: Diagnosis present

## 2017-06-03 DIAGNOSIS — I10 Essential (primary) hypertension: Secondary | ICD-10-CM | POA: Diagnosis not present

## 2017-06-03 DIAGNOSIS — I081 Rheumatic disorders of both mitral and tricuspid valves: Secondary | ICD-10-CM | POA: Diagnosis not present

## 2017-06-10 ENCOUNTER — Telehealth: Payer: Self-pay | Admitting: Adult Health

## 2017-06-10 MED ORDER — ALBUTEROL SULFATE HFA 108 (90 BASE) MCG/ACT IN AERS: 2.0000 | INHALATION_SPRAY | Freq: Four times a day (QID) | RESPIRATORY_TRACT | 1 refills | Status: DC | PRN

## 2017-06-10 MED ORDER — FLUTICASONE-SALMETEROL 250-50 MCG/DOSE IN AEPB: INHALATION_SPRAY | RESPIRATORY_TRACT | 2 refills | Status: DC

## 2017-06-10 NOTE — Telephone Encounter (Signed)
Spoke with Christy Sartorius at [facility]. He stated that they do not have any requests for Advair or ProAir.   Spoke with patient. Advised her that I will send in the RXs for her today. She verbalized understanding. Nothing else needed at time of call.

## 2017-06-13 ENCOUNTER — Telehealth: Payer: Self-pay | Admitting: Internal Medicine

## 2017-06-13 NOTE — Telephone Encounter (Signed)
Called and spoke to pharmacist at [facility]. Per patient's formulary patient needed to be switched from one albuterol inhaler to another of the same to be covered which has been done. Nothing further needed at this time.

## 2017-06-21 ENCOUNTER — Ambulatory Visit: Payer: Medicare Other | Admitting: Cardiovascular Disease

## 2017-06-21 ENCOUNTER — Encounter: Payer: Self-pay | Admitting: Cardiovascular Disease

## 2017-06-21 VITALS — BP 134/82 | HR 73 | Ht 65.0 in | Wt 259.0 lb

## 2017-06-21 DIAGNOSIS — I10 Essential (primary) hypertension: Secondary | ICD-10-CM

## 2017-06-21 DIAGNOSIS — Z952 Presence of prosthetic heart valve: Secondary | ICD-10-CM | POA: Diagnosis not present

## 2017-06-21 NOTE — Patient Instructions (Signed)
Medication Instructions: Your physician recommends that you continue on your current medications as directed. Please refer to the Current Medication list given to you today.   Testing/Procedures: Your physician has requested that you have an echocardiogram in 1 year. Echocardiography is a painless test that uses sound waves to create images of your heart. It provides your doctor with information about the size and shape of your heart and how well your heart's chambers and valves are working. This procedure takes approximately one hour. There are no restrictions for this procedure.  Follow-Up: We request that you follow-up in: 6 months with an extender and in 12 months with Dr Andria Rhein will receive a reminder letter in the mail two months in advance. If you don't receive a letter, please call our office to schedule the follow-up appointment.  If you need a refill on your cardiac medications before your next appointment, please call your pharmacy.

## 2017-06-21 NOTE — Assessment & Plan Note (Signed)
Status post St. Jude aVR in 2006. She had her annual 2-D echo performed 06/03/17 which showed slight decline in her LV function from 50 at 55% down to 45%. Her aortic mechanical prosthesis was working well. She did have moderate MR which is chronic as well as severe TR which is chronic as well. She does have chronic dyspnea. She is wearing some restriction. She has lost 20 pounds intentionally. We will continue to follow her 2-D echocardiogram on annual basis.

## 2017-06-21 NOTE — Progress Notes (Signed)
Status post St. Jude aVR in 2006. She had her annual 2-D echo performed 06/03/17 which showed slight decline in her LV function from 50 at 55% down to 45%. Her aortic mechanical prosthesis was working well. She did have moderate MR which is chronic as well as severe TR which is chronic as well. She does have chronic dyspnea. She is wearing some restriction. She has lost 20 pounds intentionally. We will continue to follow her 2-D echocardiogram on annual basis.she did have carotid Dopplers that showed a known occluded left carotid artery with no evidence of right ICA stenosis and renal Dopplers that showed a chronically occluded left renal artery with a widely patent right renal artery.her EKG shows a paced rhythm today.  [name], M.D., Time, [facility], [place] [address] [place], [place]  [address]  [phone] 06/21/2017 11:34 AM

## 2017-06-24 ENCOUNTER — Other Ambulatory Visit: Payer: Self-pay | Admitting: Cardiovascular Disease

## 2017-06-24 LAB — PROTIME-INR: INR: 4.5 — AB (ref 0.9–1.1)

## 2017-06-25 ENCOUNTER — Ambulatory Visit (INDEPENDENT_AMBULATORY_CARE_PROVIDER_SITE_OTHER): Payer: Medicare Other | Admitting: Pharmacist Clinician (PhC)/ Clinical Pharmacy Specialist

## 2017-06-25 DIAGNOSIS — Z952 Presence of prosthetic heart valve: Secondary | ICD-10-CM

## 2017-06-25 DIAGNOSIS — Z5181 Encounter for therapeutic drug level monitoring: Secondary | ICD-10-CM | POA: Diagnosis not present

## 2017-06-25 LAB — PROTIME-INR
INR: 4.5 — ABNORMAL HIGH (ref 0.8–1.2)
Prothrombin Time: 42.7 s — ABNORMAL HIGH (ref 9.1–12.0)

## 2017-06-27 ENCOUNTER — Telehealth: Payer: Self-pay | Admitting: Cardiovascular Disease

## 2017-06-27 NOTE — Telephone Encounter (Signed)
Patient with diagnosis of mechanical AVR with documented antiphospholipid per rheumatology on warfarin for anticoagulation.    Procedure: colonoscopy Date of procedure: 07/17/17  Per office protocol, patient can hold warfarin for 5 days prior to procedure WITH lovenox bridge.    Pt will need to be called and scheduled in the office to go over lovenox bridge instructions. She last bridged in 2014 it appears.

## 2017-06-27 NOTE — Telephone Encounter (Signed)
   [facility] Pre-operative Risk Assessment    Request for surgical clearance:  1. What type of surgery is being performed? colonoscopy    2. When is this surgery scheduled? Jul 17, 2017   3. What type of clearance is required (medical clearance vs. Pharmacy clearance to hold med vs. Both)? Both   4. Are there any medications that need to be held prior to surgery and how long? Warfarin - 4 days prior    5. Practice name and name of physician performing surgery? Dr. Shary Key @ [facility]    6. What is your office phone number [phone]      7.   What is your office fax number [phone]  8.   Anesthesia type (None, local, MAC, general) ? Not specified    Fidel Levy 06/27/2017, 7:53 AM  _________________________________________________________________   (provider comments below)

## 2017-06-28 ENCOUNTER — Encounter: Payer: Self-pay | Admitting: Pharmacist

## 2017-06-28 ENCOUNTER — Telehealth: Payer: Self-pay | Admitting: Pharmacist

## 2017-06-28 DIAGNOSIS — R760 Raised antibody titer: Secondary | ICD-10-CM

## 2017-06-28 HISTORY — DX: Raised antibody titer: R76.0

## 2017-06-28 NOTE — Telephone Encounter (Signed)
   Primary Cardiologist:Jonathan Gwenlyn Found, MD  Chart reviewed as part of pre-operative protocol coverage. Pharmacist feels pt will need to be called and scheduled in the office to go over Lovenox bridge instructions. I am not sure if this has occurred yet as I don't see the note was routed anywhere for scheduling. In the interim I will forward to Dr. Gwenlyn Found for cardiac clearance of colonoscopy - seen 06/21/17 in clinic. No recent CBC or BMET on file; last performed 05/2016 in care everywhere. Await MD input on clearance. Will also route back to pharmacy to find out if they are scheduling appt.  Charlie Pitter, PA-C  06/28/2017, 3:50 PM

## 2017-06-28 NOTE — Telephone Encounter (Signed)
Medical  history update.  Patient with Positive antiphospholipid antibody lab without thrombosis - per Dr Leigh Aurora notes on 06/11/2012.   *See Media --> Note--> Rheumatology* for additional information

## 2017-06-28 NOTE — Telephone Encounter (Signed)
Pt has been scheduled in office on 5/24 to go over bridge instructions. She is aware that 5/23 will be last dose of warfarin before procedure.

## 2017-06-29 NOTE — Telephone Encounter (Signed)
OK to have colonoscopy with lovenox bridge

## 2017-07-02 NOTE — Telephone Encounter (Signed)
Per chart review, it does not appear that the patient has been medically cleared. Left voicemail for patient to call back for cardiac clearance.

## 2017-07-08 ENCOUNTER — Other Ambulatory Visit: Payer: Self-pay | Admitting: Cardiovascular Disease

## 2017-07-08 LAB — POCT INR: INR: 3.5 — AB (ref 2.0–3.0)

## 2017-07-08 NOTE — Telephone Encounter (Signed)
   Primary Cardiologist:Jonathan Gwenlyn Found, MD  Chart reviewed as part of pre-operative protocol coverage. Pre-op clearance already addressed by colleagues in earlier phone notes. To summarize recommendations:  The patient ws recently seen in the office on 06/21/17 by Dr. Gwenlyn Found and noted to have mild decrease in LV function with EF 45%. Her aortic prosthetic was working well. She has chronic dyspnea and was stable.  Per Dr. Gwenlyn Found she is OK to have colonoscopy with lovenox bridge.   The patient is scheduled in the office on 07/12/17 to go over bridge instructions. The patient is aware that her last dose of warfarin before the procedure will be on 5/23.   Will route this bundled recommendation to requesting provider via Epic fax function. Please call with questions.  [name] Perch, NP 07/08/2017, 1:34 PM

## 2017-07-09 ENCOUNTER — Ambulatory Visit (INDEPENDENT_AMBULATORY_CARE_PROVIDER_SITE_OTHER): Payer: Medicare Other | Admitting: Pharmacist Clinician (PhC)/ Clinical Pharmacy Specialist

## 2017-07-09 DIAGNOSIS — Z952 Presence of prosthetic heart valve: Secondary | ICD-10-CM

## 2017-07-09 DIAGNOSIS — Z5181 Encounter for therapeutic drug level monitoring: Secondary | ICD-10-CM

## 2017-07-09 LAB — PROTIME-INR
INR: 3.5 — ABNORMAL HIGH (ref 0.8–1.2)
Prothrombin Time: 34.1 s — ABNORMAL HIGH (ref 9.1–12.0)

## 2017-07-12 ENCOUNTER — Other Ambulatory Visit: Payer: Self-pay | Admitting: Pharmacist Clinician (PhC)/ Clinical Pharmacy Specialist

## 2017-07-12 ENCOUNTER — Ambulatory Visit (INDEPENDENT_AMBULATORY_CARE_PROVIDER_SITE_OTHER): Payer: Medicare Other | Admitting: Pharmacist Clinician (PhC)/ Clinical Pharmacy Specialist

## 2017-07-12 DIAGNOSIS — Z5181 Encounter for therapeutic drug level monitoring: Secondary | ICD-10-CM | POA: Diagnosis not present

## 2017-07-12 DIAGNOSIS — Z952 Presence of prosthetic heart valve: Secondary | ICD-10-CM

## 2017-07-12 DIAGNOSIS — Z7901 Long term (current) use of anticoagulants: Secondary | ICD-10-CM | POA: Diagnosis not present

## 2017-07-12 LAB — POCT INR: INR: 4.3 — AB (ref 2.0–3.0)

## 2017-07-12 MED ORDER — ENOXAPARIN SODIUM 120 MG/0.8ML ~~LOC~~ SOLN: 120.0000 mg | Freq: Two times a day (BID) | SUBCUTANEOUS | 1 refills | Status: DC

## 2017-07-12 MED ORDER — ENOXAPARIN SODIUM 120 MG/0.8ML ~~LOC~~ SOLN: 120.0000 mg | Freq: Two times a day (BID) | SUBCUTANEOUS | 0 refills | Status: DC

## 2017-07-12 NOTE — Patient Instructions (Signed)
Description   Follow bridging schedule, repeat INR on June 4

## 2017-07-16 ENCOUNTER — Ambulatory Visit (INDEPENDENT_AMBULATORY_CARE_PROVIDER_SITE_OTHER): Payer: Medicare Other | Admitting: *Deleted

## 2017-07-16 DIAGNOSIS — I442 Atrioventricular block, complete: Secondary | ICD-10-CM | POA: Diagnosis not present

## 2017-07-16 NOTE — Progress Notes (Signed)
Remote pacemaker transmission.   

## 2017-07-19 ENCOUNTER — Encounter: Payer: Self-pay | Admitting: Cardiology

## 2017-07-23 ENCOUNTER — Ambulatory Visit (INDEPENDENT_AMBULATORY_CARE_PROVIDER_SITE_OTHER): Payer: Medicare Other | Admitting: Pharmacist

## 2017-07-23 DIAGNOSIS — Z952 Presence of prosthetic heart valve: Secondary | ICD-10-CM

## 2017-07-23 DIAGNOSIS — Z5181 Encounter for therapeutic drug level monitoring: Secondary | ICD-10-CM | POA: Diagnosis not present

## 2017-07-23 LAB — POCT INR: INR: 2.2 (ref 2.0–3.0)

## 2017-08-01 LAB — CUP PACEART REMOTE DEVICE CHECK
Date Time Interrogation Session: 20190613101655
Implantable Lead Implant Date: 20150116
Implantable Lead Location: 753859
Implantable Lead Model: 5076
MDC IDC LEAD IMPLANT DT: 20150116
MDC IDC LEAD LOCATION: 753860
MDC IDC PG IMPLANT DT: 20150116
MDC IDC PG SERIAL: 7587004

## 2017-08-12 ENCOUNTER — Other Ambulatory Visit: Payer: Self-pay | Admitting: Cardiovascular Disease

## 2017-08-12 LAB — PROTIME-INR: INR: 2.3 — AB (ref <=1.1)

## 2017-08-13 ENCOUNTER — Ambulatory Visit (INDEPENDENT_AMBULATORY_CARE_PROVIDER_SITE_OTHER): Payer: Medicare Other | Admitting: Pharmacist Clinician (PhC)/ Clinical Pharmacy Specialist

## 2017-08-13 DIAGNOSIS — Z952 Presence of prosthetic heart valve: Secondary | ICD-10-CM

## 2017-08-13 DIAGNOSIS — Z7901 Long term (current) use of anticoagulants: Secondary | ICD-10-CM

## 2017-08-13 DIAGNOSIS — Z5181 Encounter for therapeutic drug level monitoring: Secondary | ICD-10-CM

## 2017-08-13 DIAGNOSIS — R760 Raised antibody titer: Secondary | ICD-10-CM

## 2017-08-13 LAB — PROTIME-INR
INR: 2.3 — ABNORMAL HIGH (ref 0.8–1.2)
PROTHROMBIN TIME: 23.1 s — AB (ref 9.1–12.0)

## 2017-08-26 ENCOUNTER — Other Ambulatory Visit: Payer: Self-pay | Admitting: Cardiovascular Disease

## 2017-08-26 LAB — PROTIME-INR: INR: 2.1 — AB (ref <=1.1)

## 2017-08-27 ENCOUNTER — Ambulatory Visit (INDEPENDENT_AMBULATORY_CARE_PROVIDER_SITE_OTHER): Payer: Medicare Other | Admitting: Pharmacist Clinician (PhC)/ Clinical Pharmacy Specialist

## 2017-08-27 DIAGNOSIS — Z952 Presence of prosthetic heart valve: Secondary | ICD-10-CM | POA: Diagnosis not present

## 2017-08-27 DIAGNOSIS — Z5181 Encounter for therapeutic drug level monitoring: Secondary | ICD-10-CM | POA: Diagnosis not present

## 2017-08-27 DIAGNOSIS — R760 Raised antibody titer: Secondary | ICD-10-CM

## 2017-08-27 LAB — PROTIME-INR
INR: 2.1 — ABNORMAL HIGH (ref 0.8–1.2)
PROTHROMBIN TIME: 20.8 s — AB (ref 9.1–12.0)

## 2017-09-07 ENCOUNTER — Encounter (HOSPITAL_COMMUNITY): Payer: Self-pay | Admitting: Emergency Medicine

## 2017-09-07 ENCOUNTER — Emergency Department (HOSPITAL_COMMUNITY): Payer: Medicare Other

## 2017-09-07 ENCOUNTER — Emergency Department (HOSPITAL_COMMUNITY)
Admission: EM | Admit: 2017-09-07 | Discharge: 2017-09-07 | Disposition: A | Discharge status: Home / Self Care | Payer: Medicare Other | Attending: Emergency Medicine | Admitting: Emergency Medicine

## 2017-09-07 DIAGNOSIS — Z79899 Other long term (current) drug therapy: Secondary | ICD-10-CM | POA: Insufficient documentation

## 2017-09-07 DIAGNOSIS — E860 Dehydration: Secondary | ICD-10-CM | POA: Diagnosis not present

## 2017-09-07 DIAGNOSIS — I110 Hypertensive heart disease with heart failure: Secondary | ICD-10-CM | POA: Diagnosis not present

## 2017-09-07 DIAGNOSIS — J45909 Unspecified asthma, uncomplicated: Secondary | ICD-10-CM | POA: Insufficient documentation

## 2017-09-07 DIAGNOSIS — R197 Diarrhea, unspecified: Secondary | ICD-10-CM | POA: Diagnosis not present

## 2017-09-07 DIAGNOSIS — I5032 Chronic diastolic (congestive) heart failure: Secondary | ICD-10-CM | POA: Insufficient documentation

## 2017-09-07 DIAGNOSIS — R42 Dizziness and giddiness: Secondary | ICD-10-CM

## 2017-09-07 DIAGNOSIS — Z7901 Long term (current) use of anticoagulants: Secondary | ICD-10-CM | POA: Insufficient documentation

## 2017-09-07 DIAGNOSIS — Z950 Presence of cardiac pacemaker: Secondary | ICD-10-CM | POA: Diagnosis not present

## 2017-09-07 DIAGNOSIS — I2510 Atherosclerotic heart disease of native coronary artery without angina pectoris: Secondary | ICD-10-CM | POA: Insufficient documentation

## 2017-09-07 DIAGNOSIS — Z952 Presence of prosthetic heart valve: Secondary | ICD-10-CM | POA: Insufficient documentation

## 2017-09-07 LAB — PROTIME-INR
INR: 2.64
PROTHROMBIN TIME: 28 s — AB (ref 11.4–15.2)

## 2017-09-07 LAB — COMPREHENSIVE METABOLIC PANEL
ALBUMIN: 3.5 g/dL (ref 3.5–5.0)
ALT: 36 U/L (ref 0–44)
ANION GAP: 9 (ref 5–15)
AST: 35 U/L (ref 15–41)
Alkaline Phosphatase: 79 U/L (ref 38–126)
BILIRUBIN TOTAL: 0.7 mg/dL (ref 0.3–1.2)
BUN: 26 mg/dL — ABNORMAL HIGH (ref 8–23)
CALCIUM: 9.6 mg/dL (ref 8.9–10.3)
CO2: 30 mmol/L (ref 22–32)
CREATININE: 1.44 mg/dL — AB (ref 0.44–1.00)
Chloride: 103 mmol/L (ref 98–111)
GFR calc Af Amer: 43 mL/min — ABNORMAL LOW (ref >60)
GFR calc non Af Amer: 37 mL/min — ABNORMAL LOW (ref >60)
GLUCOSE: 106 mg/dL — AB (ref 70–99)
POTASSIUM: 3.6 mmol/L (ref 3.5–5.1)
SODIUM: 142 mmol/L (ref 135–145)
TOTAL PROTEIN: 7 g/dL (ref 6.5–8.1)

## 2017-09-07 LAB — CBC
HCT: 43.2 % (ref 36.0–46.0)
Hemoglobin: 13.3 g/dL (ref 12.0–15.0)
MCH: 27.8 pg (ref 26.0–34.0)
MCHC: 30.8 g/dL (ref 30.0–36.0)
MCV: 90.4 fL (ref 78.0–100.0)
PLATELETS: 214 10*3/uL (ref 150–400)
RBC: 4.78 MIL/uL (ref 3.87–5.11)
RDW: 15.2 % (ref 11.5–15.5)
WBC: 5.8 10*3/uL (ref 4.0–10.5)

## 2017-09-07 LAB — DIFFERENTIAL
Abs Immature Granulocytes: 0 10*3/uL (ref 0.0–0.1)
BASOS ABS: 0.1 10*3/uL (ref 0.0–0.1)
Basophils Relative: 1 %
EOS ABS: 0.2 10*3/uL (ref 0.0–0.7)
Eosinophils Relative: 3 %
IMMATURE GRANULOCYTES: 0 %
Lymphocytes Relative: 18 %
Lymphs Abs: 1 10*3/uL (ref 0.7–4.0)
Monocytes Absolute: 0.7 10*3/uL (ref 0.1–1.0)
Monocytes Relative: 12 %
Neutro Abs: 3.9 10*3/uL (ref 1.7–7.7)
Neutrophils Relative %: 66 %

## 2017-09-07 LAB — I-STAT CHEM 8, ED
BUN: 30 mg/dL — AB (ref 8–23)
CALCIUM ION: 1.11 mmol/L — AB (ref 1.15–1.40)
Chloride: 103 mmol/L (ref 98–111)
Creatinine, Ser: 1.3 mg/dL — ABNORMAL HIGH (ref 0.44–1.00)
GLUCOSE: 100 mg/dL — AB (ref 70–99)
HCT: 42 % (ref 36.0–46.0)
Hemoglobin: 14.3 g/dL (ref 12.0–15.0)
Potassium: 3.7 mmol/L (ref 3.5–5.1)
SODIUM: 140 mmol/L (ref 135–145)
TCO2: 31 mmol/L (ref 22–32)

## 2017-09-07 LAB — APTT: APTT: 32 s (ref 24–36)

## 2017-09-07 LAB — I-STAT TROPONIN, ED: Troponin i, poc: 0.02 ng/mL (ref 0.00–0.08)

## 2017-09-07 MED ORDER — SODIUM CHLORIDE 0.9 % IV BOLUS: 1000.0000 mL | Freq: Once | INTRAVENOUS | Status: DC

## 2017-09-07 MED ORDER — LOPERAMIDE HCL 2 MG PO CAPS
2.0000 mg | ORAL_CAPSULE | Freq: Once | ORAL | Status: AC
  Administered 2017-09-07: 2 mg via ORAL
  Filled 2017-09-07: qty 1

## 2017-09-07 NOTE — ED Provider Notes (Signed)
[facility] EMERGENCY DEPARTMENT Provider Note   CSN: [id number] Arrival date & time: 09/07/17  1133     History   Chief Complaint Chief Complaint  Patient presents with  . Dizziness    HPI [name] is a 67 y.o. female.  The history is provided by the patient. No language interpreter was used.  Dizziness  Quality:  Vertigo Severity:  Severe Onset quality:  Sudden Duration:  1 day Timing:  Sporadic Progression:  Worsening Chronicity:  New Context: standing up   Relieved by:  Nothing Worsened by:  Nothing Ineffective treatments:  None tried Associated symptoms: diarrhea   Associated symptoms: no chest pain and no nausea   Risk factors: hx of vertigo   Pt reports she has had diarrhea on and off for several days.  No abdominal pain.  Pt reports when she stood up today she felt very dizzy.  Pt has had vertigo in the past.  Pt reports this episode was similar.  Pt reports no chest pain, no shortness of breath.no palpitations.    Past Medical History:  Diagnosis Date  . Antiphospholipid antibody positive 06/28/2017  . Asthma   . CAD (coronary artery disease)    a. normal cors by cath in 03/2015  . Carotid artery disease ([place])   . Chronic anticoagulation   . Complete heart block ([place]) 02/2013   a. Implantation of a dual chamber pacemaker in 02/2013 by Dr Rayann Heman. STJ model number 0254 pacemaker with model number 2088 right ventricular lead.   [name] Dyslipidemia   . H/O Doppler ultrasound 10/03/2010   carotid duplex - R ICA normal patency; L CCA-ICA bypass gradt patent common to interal cartoid bypass graft   . H/O mechanical aortic valve replacement   . History of echocardiogram 11/08/2011   EF >55%; mild-mod concentric LVH; trace aortic regurg.; LA mild-mod dilated  . History of nuclear stress test 01/20/2010   normal pattern of perfusion; low risk scan  . Hypertension   . Lupus (systemic lupus erythematosus) ([place])   . PVD (peripheral vascular disease)  ([facility])    60% R renal artery stenosis; non-functioning L kidney    Patient Active Problem List   Diagnosis Date Noted  . Antiphospholipid antibody positive 06/28/2017  . Hypervolemia 12/21/2015  . Palpitations 12/21/2015  . CHF (congestive heart failure) ([place]) 12/21/2015  . Chest pain with moderate risk for cardiac etiology 04/19/2015  . Diastolic dysfunction 27/07/2374  . Renal insufficiency 02/10/2015  . Acute on chronic diastolic CHF (congestive heart failure) ([place]) 01/28/2015  . Hypokalemia 01/28/2015  . Normal coronary arteries 12/09/2014  . Morbid obesity ([place]) 12/09/2014  . Acute congestive heart failure with left ventricular diastolic dysfunction ([place]) 07/08/2013  . Encounter for therapeutic drug monitoring 03/16/2013  . Renal artery stenosis ([place]) 03/16/2013  . Carotid stenosis 03/16/2013  . Cardiac pacemaker in situ - St Jude 03/16/2013  . Orthostatic hypotension 03/09/2013  . Dizziness 03/08/2013  . Dizziness of unknown cause 03/08/2013  . Symptomatic bradycardia 03/06/2013  . Asystole, with syncope 03/06/2013  . Anticoagulated on Coumadin for mechanical valve 03/06/2013  . Complete heart block ([place]) 03/06/2013  . Hyperlipidemia 08/28/2012  . Essential hypertension 08/28/2012  . Long term (current) use of anticoagulants - Coumadin for mechanical AoV 05/09/2012  . Seasonal and perennial allergic rhinitis 11/14/2011  . Asthma, mild intermittent 08/26/2007  . LUPUS (SLE) 08/26/2007  . S/P AVR (aortic valve replacement) 08/26/2007    Past Surgical History:  Procedure Laterality Date  .  AORTIC VALVE REPLACEMENT  11/29/006   Gerhardt; [place]; on coumadin  . CARDIAC CATHETERIZATION  11/01/2004   define anatomy   . CARDIAC CATHETERIZATION N/A 04/19/2015   Procedure: Coronary Angiography;  Surgeon: Peter M [name], MD;  Location: [facility];  Service: Cardiovascular;  Laterality: N/A;  . CAROTID ENDARTERECTOMY     lt.  . CHOLECYSTECTOMY  1999  . EXPLORATORY  LAPAROTOMY  04/12/2012   small bowel perf; ileostomy & R hemicolectomy   . PACEMAKER INSERTION  03/06/2013   SJM Assurity DR PPM implanted by Dr Rayann Heman for complete heart block  . PERMANENT PACEMAKER INSERTION N/A 03/06/2013   Procedure: PERMANENT PACEMAKER INSERTION;  Surgeon: Coralyn Mark, MD;  Location: [facility] CATH LAB;  Service: Cardiovascular;  Laterality: N/A;  . TEMPORARY PACEMAKER INSERTION N/A 03/06/2013   Procedure: TEMPORARY PACEMAKER INSERTION;  Surgeon: Coralyn Mark, MD;  Location: [facility] C[facility] LAB;  Service: Cardiovascular;  Laterality: N/A;  . TUBAL LIGATION  11/1982     OB History   None      Home Medications    Prior to Admission medications   Medication Sig Start Date End Date Taking? Authorizing Provider  acetaminophen (TYLENOL) 325 MG tablet Take 650 mg by mouth every 6 (six) hours as needed for moderate pain.     [provider]  albuterol (PROVENTIL HFA;VENTOLIN HFA) 108 (90 Base) MCG/ACT inhaler Inhale 2 puffs into the lungs every 6 (six) hours as needed for wheezing or shortness of breath. 06/10/17   Baird Lyons D, MD  Calcium Carbonate (CALTRATE 600) 1500 MG TABS Take 1 tablet by mouth 2 (two) times daily.      [provider]  chlorhexidine (PERIDEX) 0.12 % solution USES ONCE EVERY FEW WEEKS IN WATER PIK AS DIRECTED 03/31/15   [provider]  cholecalciferol (VITAMIN D) 1000 UNITS tablet Take 2,000 Units by mouth daily.     [provider]  enoxaparin (LOVENOX) 120 MG/0.8ML injection Inject 0.8 mLs (120 mg total) into the skin every 12 (twelve) hours. As directed by coumadin clinic 07/12/17   [name], MD  febuxostat (ULORIC) 40 MG tablet Take 1 tablet by mouth every other day.     [provider]  Fluticasone-Salmeterol (ADVAIR DISKUS) 250-50 MCG/DOSE AEPB USE 1 INHALATION TWO TIMES  DAILY 06/10/17   Baird Lyons D, MD  furosemide (LASIX) 40 MG tablet Take 1 tablet (40 mg total) by mouth daily. 01/02/16   Strader,  Fransisco Hertz, PA-C  hydroxychloroquine (PLAQUENIL) 200 MG tablet Take 200 mg by mouth daily.    [provider]  isosorbide mononitrate (IMDUR) 30 MG 24 hr tablet TAKE 1 TABLET BY MOUTH  DAILY 10/26/16   [name], MD  leflunomide (ARAVA) 10 MG tablet Take 10 mg by mouth daily.    [provider]  loratadine (CLARITIN) 10 MG tablet Take 10 mg by mouth daily.    [provider]  metoprolol succinate (TOPROL-XL) 50 MG 24 hr tablet TAKE 1 TABLET BY MOUTH  DAILY - TAKE WITH OR  IMMEDIATELY FOLLOWING A  MEAL 02/15/17   [name], MD  predniSONE (DELTASONE) 5 MG tablet Take 5 mg by mouth daily.      [provider]  Probiotic Product (PROBIOTIC DAILY PO) Take 1 tablet by mouth daily.    [provider]  simvastatin (ZOCOR) 20 MG tablet Take 1 tablet by mouth at  bedtime 06/07/15   [name], MD  warfarin (COUMADIN)  5 MG tablet TAKE 1 TO 1 AND 1/2 TABLETS BY MOUTH DAILY AS DIRECTED  BY COUMADIN CLINIC 02/20/17   [name], MD    Family History Family History  Problem Relation Age of Onset  . Alzheimer's disease Father   . Cirrhosis Father   . Cancer Father        prostate  . Heart attack Neg Hx     Social History Social History   Tobacco Use  . Smoking status: Never Smoker  . Smokeless tobacco: Never Used  Substance Use Topics  . Alcohol use: No  . Drug use: No     Allergies   Patient has no known allergies.   Review of Systems Review of Systems  Cardiovascular: Negative for chest pain.  Gastrointestinal: Positive for diarrhea. Negative for nausea.  Neurological: Positive for dizziness.  All other systems reviewed and are negative.    Physical Exam Updated Vital Signs BP (!) 142/74   Pulse 72   Temp 97.7 F (36.5 C) (Oral)   Resp 18   SpO2 98%   Physical Exam  Constitutional: She appears well-developed and well-nourished.  HENT:  Head: Normocephalic.  Right Ear: External ear normal.  Left Ear:  External ear normal.  Nose: Nose normal.  Mouth/Throat: Oropharynx is clear and moist.  Eyes: Pupils are equal, round, and reactive to light. Conjunctivae and EOM are normal.  Neck: Normal range of motion. Neck supple.  Cardiovascular: Normal rate and regular rhythm.  Pulmonary/Chest: Effort normal.  Abdominal: Soft. Bowel sounds are normal.  Musculoskeletal: Normal range of motion.  Neurological: She is alert.  Skin: Skin is warm.  Psychiatric: She has a normal mood and affect.  Nursing note and vitals reviewed.    ED Treatments / Results  Labs (all labs ordered are listed, but only abnormal results are displayed) Labs Reviewed  PROTIME-INR - Abnormal; Notable for the following components:      Result Value   Prothrombin Time 28.0 (*)    All other components within normal limits  COMPREHENSIVE METABOLIC PANEL - Abnormal; Notable for the following components:   Glucose, Bld 106 (*)    BUN 26 (*)    Creatinine, Ser 1.44 (*)    GFR calc non Af Amer 37 (*)    GFR calc Af Amer 43 (*)    All other components within normal limits  I-STAT CHEM 8, ED - Abnormal; Notable for the following components:   BUN 30 (*)    Creatinine, Ser 1.30 (*)    Glucose, Bld 100 (*)    Calcium, Ion 1.11 (*)    All other components within normal limits  APTT  CBC  DIFFERENTIAL  I-STAT TROPONIN, ED    EKG None  Radiology Ct Head Wo Contrast  Result Date: 09/07/2017 CLINICAL DATA:  Dizziness. EXAM: CT HEAD WITHOUT CONTRAST TECHNIQUE: Contiguous axial images were obtained from the base of the skull through the vertex without intravenous contrast. COMPARISON:  CT head dated March 08, 2013. FINDINGS: Brain: No evidence of acute infarction, hemorrhage, hydrocephalus, extra-axial collection or mass lesion/mass effect. Stable mild age related cerebral atrophy. Vascular: Atherosclerotic vascular calcification of the carotid siphons. No hyperdense vessel. Skull: Normal. Negative for fracture or focal  lesion. Sinuses/Orbits: No acute finding. Other: None. IMPRESSION: 1. Normal for age noncontrast head CT. Electronically Signed   By: Titus Dubin M.D.   On: 09/07/2017 12:26    Procedures Procedures (including critical care time)  Medications Ordered in ED Medications  sodium  chloride 0.9 % bolus 1,000 mL (has no administration in time range)     Initial Impression / Assessment and Plan / ED Course  I have reviewed the triage vital signs and the nursing notes.  Pertinent labs & imaging results that were available during my care of the patient were reviewed by me and considered in my medical decision making (see chart for details).     MDM  Ct scan is normal  Pt has slight increase in her BUN.   I suspect pt is mildly dehydrated given recent diarrhea.  Pt does not have abdominal pain.   Pt was able to tolerate 24 ounces of po fluids. Pt able to stand without dizziness.  Pt given imodium 2mg  here to help with diarrhea.  Pt is advised to see her MD for recheck this week.  She is return if symptoms worsen or change.    Final Clinical Impressions(s) / ED Diagnoses   Final diagnoses:  Dizziness  Dehydration  Diarrhea, unspecified type    ED Discharge Orders    None    An After Visit Summary was printed and given to the patient.   Fransico Meadow, [place] 09/07/17 1652    Charlesetta Shanks, MD 09/21/17 [phone]

## 2017-09-07 NOTE — ED Notes (Signed)
Patient able to ambulate independently  

## 2017-09-07 NOTE — Discharge Instructions (Addendum)
See your Physician for recheck next week.  Return if any problems.

## 2017-09-07 NOTE — ED Notes (Signed)
Pt transported to CT ?

## 2017-09-07 NOTE — ED Notes (Signed)
Registration at bedside.

## 2017-09-07 NOTE — ED Notes (Signed)
This RN attempted for IV x 2 without success.  Second RN looking at this time

## 2017-09-07 NOTE — ED Triage Notes (Addendum)
Patient arrives with [facility] with complaint of dizziness. Went to sleep at baseline 0400 and woke up at 0700 this morning with severe dizziness. Anticoagulated with coumadin, states her level has been low for the last two months. Report slight improvement when she closes her eyes. No other neuro changes. Has St Jude's pacemaker. Patient alert, oriented, and in no apparent distress at this time.

## 2017-09-07 NOTE — ED Notes (Signed)
Patient ambulated independently to restroom.  Gait steady and even.

## 2017-09-07 NOTE — ED Notes (Signed)
Angela Nevin, ED RN unsuccessful at IV attempt.  Spoke to PA about IV team consult.  Instructed this RN to provide several glasses of water to patient to drink.  If patient successful, will come to reassess.

## 2017-09-11 ENCOUNTER — Other Ambulatory Visit: Payer: Self-pay | Admitting: Cardiovascular Disease

## 2017-09-11 LAB — PROTIME-INR: INR: 2.3 — AB (ref <=1.1)

## 2017-09-12 ENCOUNTER — Ambulatory Visit (INDEPENDENT_AMBULATORY_CARE_PROVIDER_SITE_OTHER): Payer: Medicare Other | Admitting: Pharmacist

## 2017-09-12 DIAGNOSIS — Z952 Presence of prosthetic heart valve: Secondary | ICD-10-CM

## 2017-09-12 DIAGNOSIS — Z5181 Encounter for therapeutic drug level monitoring: Secondary | ICD-10-CM | POA: Diagnosis not present

## 2017-09-12 LAB — PROTIME-INR
INR: 2.3 — AB (ref 0.8–1.2)
PROTHROMBIN TIME: 22.7 s — AB (ref 9.1–12.0)

## 2017-10-02 ENCOUNTER — Other Ambulatory Visit: Payer: Self-pay | Admitting: Cardiovascular Disease

## 2017-10-03 ENCOUNTER — Ambulatory Visit (INDEPENDENT_AMBULATORY_CARE_PROVIDER_SITE_OTHER): Payer: Medicare Other | Admitting: Pharmacist

## 2017-10-03 DIAGNOSIS — Z5181 Encounter for therapeutic drug level monitoring: Secondary | ICD-10-CM

## 2017-10-03 DIAGNOSIS — Z952 Presence of prosthetic heart valve: Secondary | ICD-10-CM

## 2017-10-03 LAB — PROTIME-INR
INR: 2.9 — ABNORMAL HIGH (ref 0.8–1.2)
Prothrombin Time: 28 s — ABNORMAL HIGH (ref 9.1–12.0)

## 2017-10-15 ENCOUNTER — Ambulatory Visit (INDEPENDENT_AMBULATORY_CARE_PROVIDER_SITE_OTHER): Payer: Medicare Other | Admitting: *Deleted

## 2017-10-15 DIAGNOSIS — I442 Atrioventricular block, complete: Secondary | ICD-10-CM

## 2017-10-15 NOTE — Progress Notes (Signed)
Remote pacemaker transmission.   

## 2017-10-17 ENCOUNTER — Encounter: Payer: Self-pay | Admitting: Cardiology

## 2017-10-24 ENCOUNTER — Other Ambulatory Visit: Payer: Self-pay | Admitting: Cardiovascular Disease

## 2017-10-30 ENCOUNTER — Other Ambulatory Visit: Payer: Self-pay | Admitting: Cardiovascular Disease

## 2017-10-31 ENCOUNTER — Ambulatory Visit (INDEPENDENT_AMBULATORY_CARE_PROVIDER_SITE_OTHER): Payer: Medicare Other | Admitting: Pharmacist

## 2017-10-31 DIAGNOSIS — Z952 Presence of prosthetic heart valve: Secondary | ICD-10-CM

## 2017-10-31 DIAGNOSIS — Z5181 Encounter for therapeutic drug level monitoring: Secondary | ICD-10-CM

## 2017-10-31 LAB — PROTIME-INR
INR: 3.1 — AB (ref 0.8–1.2)
PROTHROMBIN TIME: 29.3 s — AB (ref 9.1–12.0)

## 2017-11-18 LAB — CUP PACEART REMOTE DEVICE CHECK
Battery Remaining Longevity: 129 mo
Brady Statistic AP VP Percent: 17 %
Brady Statistic AS VP Percent: 83 %
Brady Statistic RA Percent Paced: 16 %
Brady Statistic RV Percent Paced: 99 %
Date Time Interrogation Session: 20190827060020
Implantable Lead Implant Date: 20150116
Implantable Lead Implant Date: 20150116
Implantable Lead Location: 753859
Implantable Lead Location: 753860
Implantable Lead Model: 5076
Lead Channel Impedance Value: 590 Ohm
Lead Channel Pacing Threshold Pulse Width: 0.4 ms
Lead Channel Sensing Intrinsic Amplitude: 12 mV
Lead Channel Setting Pacing Amplitude: 2 V
Lead Channel Setting Sensing Sensitivity: 2 mV
MDC IDC MSMT BATTERY REMAINING PERCENTAGE: 95.5 %
MDC IDC MSMT BATTERY VOLTAGE: 2.99 V
MDC IDC MSMT LEADCHNL RA IMPEDANCE VALUE: 400 Ohm
MDC IDC MSMT LEADCHNL RA PACING THRESHOLD AMPLITUDE: 0.75 V
MDC IDC MSMT LEADCHNL RA SENSING INTR AMPL: 5 mV
MDC IDC MSMT LEADCHNL RV PACING THRESHOLD AMPLITUDE: 0.625 V
MDC IDC MSMT LEADCHNL RV PACING THRESHOLD PULSEWIDTH: 0.4 ms
MDC IDC PG IMPLANT DT: 20150116
MDC IDC SET LEADCHNL RV PACING AMPLITUDE: 0.875
MDC IDC SET LEADCHNL RV PACING PULSEWIDTH: 0.4 ms
MDC IDC STAT BRADY AP VS PERCENT: 1 %
MDC IDC STAT BRADY AS VS PERCENT: 1 %
Pulse Gen Model: 2240
Pulse Gen Serial Number: 7587004

## 2017-11-27 ENCOUNTER — Other Ambulatory Visit: Payer: Self-pay | Admitting: Cardiovascular Disease

## 2017-11-27 LAB — PROTIME-INR: INR: 4 — AB (ref <=1.1)

## 2017-11-28 ENCOUNTER — Ambulatory Visit (INDEPENDENT_AMBULATORY_CARE_PROVIDER_SITE_OTHER): Payer: Medicare Other | Admitting: Pharmacist

## 2017-11-28 DIAGNOSIS — Z5181 Encounter for therapeutic drug level monitoring: Secondary | ICD-10-CM

## 2017-11-28 DIAGNOSIS — Z952 Presence of prosthetic heart valve: Secondary | ICD-10-CM

## 2017-11-28 LAB — PROTIME-INR
INR: 4 — ABNORMAL HIGH (ref 0.8–1.2)
Prothrombin Time: 36.8 s — ABNORMAL HIGH (ref 9.1–12.0)

## 2017-12-04 ENCOUNTER — Other Ambulatory Visit: Payer: Self-pay | Admitting: Cardiovascular Disease

## 2017-12-04 DIAGNOSIS — I4729 Other ventricular tachycardia: Secondary | ICD-10-CM

## 2017-12-04 DIAGNOSIS — I472 Ventricular tachycardia: Secondary | ICD-10-CM

## 2017-12-11 ENCOUNTER — Other Ambulatory Visit: Payer: Self-pay | Admitting: Cardiovascular Disease

## 2017-12-12 ENCOUNTER — Ambulatory Visit (INDEPENDENT_AMBULATORY_CARE_PROVIDER_SITE_OTHER): Payer: Medicare Other | Admitting: Pharmacist Clinician (PhC)/ Clinical Pharmacy Specialist

## 2017-12-12 ENCOUNTER — Telehealth: Payer: Self-pay | Admitting: Cardiovascular Disease

## 2017-12-12 DIAGNOSIS — Z5181 Encounter for therapeutic drug level monitoring: Secondary | ICD-10-CM | POA: Diagnosis not present

## 2017-12-12 DIAGNOSIS — Z7901 Long term (current) use of anticoagulants: Secondary | ICD-10-CM

## 2017-12-12 DIAGNOSIS — Z952 Presence of prosthetic heart valve: Secondary | ICD-10-CM

## 2017-12-12 LAB — PROTIME-INR
INR: 5.7 (ref 0.8–1.2)
INR: 5.7 — AB (ref 0.9–1.1)
Prothrombin Time: 50.5 s — ABNORMAL HIGH (ref 9.1–12.0)

## 2017-12-12 NOTE — Telephone Encounter (Signed)
See anticoag note

## 2017-12-12 NOTE — Telephone Encounter (Signed)
° °  [facility] calling with critical lab result

## 2017-12-12 NOTE — Telephone Encounter (Signed)
Crystal with [facility] called to report critical value of INR 5.7 and PT 50.5.Marland KitchenMarland Kitchen Will forward to pharmacist.

## 2017-12-25 ENCOUNTER — Other Ambulatory Visit: Payer: Self-pay | Admitting: Cardiovascular Disease

## 2017-12-25 LAB — PROTIME-INR: INR: 3.6 — AB (ref 0.9–1.1)

## 2017-12-26 ENCOUNTER — Ambulatory Visit (INDEPENDENT_AMBULATORY_CARE_PROVIDER_SITE_OTHER): Payer: Medicare Other | Admitting: Pharmacist Clinician (PhC)/ Clinical Pharmacy Specialist

## 2017-12-26 DIAGNOSIS — Z5181 Encounter for therapeutic drug level monitoring: Secondary | ICD-10-CM | POA: Diagnosis not present

## 2017-12-26 DIAGNOSIS — Z952 Presence of prosthetic heart valve: Secondary | ICD-10-CM

## 2017-12-26 LAB — PROTIME-INR
INR: 3.6 — AB (ref 0.8–1.2)
PROTHROMBIN TIME: 33.2 s — AB (ref 9.1–12.0)

## 2018-01-14 ENCOUNTER — Ambulatory Visit (INDEPENDENT_AMBULATORY_CARE_PROVIDER_SITE_OTHER): Payer: Medicare Other

## 2018-01-14 DIAGNOSIS — I5032 Chronic diastolic (congestive) heart failure: Secondary | ICD-10-CM | POA: Diagnosis not present

## 2018-01-14 DIAGNOSIS — R001 Bradycardia, unspecified: Secondary | ICD-10-CM

## 2018-01-14 NOTE — Progress Notes (Signed)
Remote pacemaker transmission.   

## 2018-01-22 ENCOUNTER — Other Ambulatory Visit: Payer: Self-pay | Admitting: Cardiovascular Disease

## 2018-01-23 ENCOUNTER — Ambulatory Visit (INDEPENDENT_AMBULATORY_CARE_PROVIDER_SITE_OTHER): Payer: Medicare Other | Admitting: Pharmacist Clinician (PhC)/ Clinical Pharmacy Specialist

## 2018-01-23 DIAGNOSIS — Z952 Presence of prosthetic heart valve: Secondary | ICD-10-CM

## 2018-01-23 DIAGNOSIS — Z5181 Encounter for therapeutic drug level monitoring: Secondary | ICD-10-CM | POA: Diagnosis not present

## 2018-01-23 LAB — PROTIME-INR
INR: 3.2 — AB (ref 0.8–1.2)
Prothrombin Time: 29.6 s — ABNORMAL HIGH (ref 9.1–12.0)

## 2018-02-21 ENCOUNTER — Telehealth: Payer: Self-pay

## 2018-02-21 NOTE — Telephone Encounter (Signed)
Spoke with pt informed her that she had been in AF since 02/19/2018, asked pt if she had any increased SOB pt stated that no more than normal pt agreeable to apt on 02/25/2018 at 1:30 pm with AF clinic, number to AF clinic given instructed pt to call the clinic the morning before for directions and parking code.

## 2018-02-24 ENCOUNTER — Other Ambulatory Visit: Payer: Self-pay | Admitting: Cardiovascular Disease

## 2018-02-24 LAB — POCT INR: INR: 3.5 — AB (ref 2.0–3.0)

## 2018-02-25 ENCOUNTER — Encounter (HOSPITAL_COMMUNITY): Payer: Self-pay | Admitting: Nurse Practitioner

## 2018-02-25 ENCOUNTER — Ambulatory Visit (HOSPITAL_COMMUNITY)
Admission: RE | Admit: 2018-02-25 | Discharge: 2018-02-25 | Disposition: A | Discharge status: Home / Self Care | Payer: Medicare Other | Source: Ambulatory Visit | Attending: Nurse Practitioner | Admitting: Nurse Practitioner

## 2018-02-25 ENCOUNTER — Ambulatory Visit (INDEPENDENT_AMBULATORY_CARE_PROVIDER_SITE_OTHER): Payer: Medicare Other | Admitting: Cardiovascular Disease

## 2018-02-25 VITALS — BP 152/86 | HR 70 | Ht 65.0 in | Wt 267.0 lb

## 2018-02-25 DIAGNOSIS — I10 Essential (primary) hypertension: Secondary | ICD-10-CM | POA: Insufficient documentation

## 2018-02-25 DIAGNOSIS — I442 Atrioventricular block, complete: Secondary | ICD-10-CM | POA: Diagnosis not present

## 2018-02-25 DIAGNOSIS — Z950 Presence of cardiac pacemaker: Secondary | ICD-10-CM | POA: Insufficient documentation

## 2018-02-25 DIAGNOSIS — J45909 Unspecified asthma, uncomplicated: Secondary | ICD-10-CM | POA: Diagnosis not present

## 2018-02-25 DIAGNOSIS — Z952 Presence of prosthetic heart valve: Secondary | ICD-10-CM

## 2018-02-25 DIAGNOSIS — I4891 Unspecified atrial fibrillation: Secondary | ICD-10-CM

## 2018-02-25 DIAGNOSIS — M329 Systemic lupus erythematosus, unspecified: Secondary | ICD-10-CM | POA: Diagnosis not present

## 2018-02-25 DIAGNOSIS — I739 Peripheral vascular disease, unspecified: Secondary | ICD-10-CM | POA: Insufficient documentation

## 2018-02-25 DIAGNOSIS — Z7951 Long term (current) use of inhaled steroids: Secondary | ICD-10-CM | POA: Insufficient documentation

## 2018-02-25 DIAGNOSIS — E785 Hyperlipidemia, unspecified: Secondary | ICD-10-CM | POA: Diagnosis not present

## 2018-02-25 DIAGNOSIS — I2510 Atherosclerotic heart disease of native coronary artery without angina pectoris: Secondary | ICD-10-CM | POA: Diagnosis not present

## 2018-02-25 DIAGNOSIS — Z7901 Long term (current) use of anticoagulants: Secondary | ICD-10-CM | POA: Insufficient documentation

## 2018-02-25 DIAGNOSIS — Z79899 Other long term (current) drug therapy: Secondary | ICD-10-CM | POA: Diagnosis not present

## 2018-02-25 DIAGNOSIS — Z7952 Long term (current) use of systemic steroids: Secondary | ICD-10-CM | POA: Diagnosis not present

## 2018-02-25 DIAGNOSIS — I480 Paroxysmal atrial fibrillation: Secondary | ICD-10-CM | POA: Insufficient documentation

## 2018-02-25 DIAGNOSIS — Z5181 Encounter for therapeutic drug level monitoring: Secondary | ICD-10-CM

## 2018-02-25 LAB — PROTIME-INR
INR: 3.5 — AB (ref 0.8–1.2)
PROTHROMBIN TIME: 32.4 s — AB (ref 9.1–12.0)

## 2018-02-25 NOTE — Progress Notes (Signed)
Primary Care Physician: [name], MD Referring Physician: Device clinic EP: Dr. Rayann Heman Cardiologist: Der. Natalia Wittmeyer is a 68 y.o. female with a h/o PVD, CAD, CHB, s/p PPM, h/o mechanical valve replacement, HTN, that is in the afib clinic by remote check  showing afib since 02/19/2018. V rates appears to have been controlled less than 100 bpm and pt was unaware. She is a paced today so it appears that she is back in SR. She did not have any shortness of breath that was any worse than her usual shortness of breath. She is already on warfarin and metoprolol. She denies any alcohol, no significant caffeine use. Feels she may snore some but does not have daytime somnolence.  She had been had in [place] for a month with her sister, "had a lot of fluid" in her legs on return. She had eaten out a lot more than her usual while there. .    Today, she denies symptoms of palpitations, chest pain, shortness of breath, orthopnea, PND, lower extremity edema, dizziness, presyncope, syncope, or neurologic sequela. The patient is tolerating medications without difficulties and is otherwise without complaint today.   Past Medical History:  Diagnosis Date  . Antiphospholipid antibody positive 06/28/2017  . Asthma   . CAD (coronary artery disease)    a. normal cors by cath in 03/2015  . Carotid artery disease ([place])   . Chronic anticoagulation   . Complete heart block ([place]) 02/2013   a. Implantation of a dual chamber pacemaker in 02/2013 by Dr Rayann Heman. STJ model number 0962 pacemaker with model number 2088 right ventricular lead.   [name] Dyslipidemia   . H/O Doppler ultrasound 10/03/2010   carotid duplex - R ICA normal patency; L CCA-ICA bypass gradt patent common to interal cartoid bypass graft   . H/O mechanical aortic valve replacement   . History of echocardiogram 11/08/2011   EF >55%; mild-mod concentric LVH; trace aortic regurg.; LA mild-mod dilated  . History of nuclear stress test 01/20/2010     normal pattern of perfusion; low risk scan  . Hypertension   . Lupus (systemic lupus erythematosus) ([place])   . PVD (peripheral vascular disease) ([facility])    60% R renal artery stenosis; non-functioning L kidney   Past Surgical History:  Procedure Laterality Date  . AORTIC VALVE REPLACEMENT  11/29/006   Gerhardt; [place]; on coumadin  . CARDIAC CATHETERIZATION  11/01/2004   define anatomy   . CARDIAC CATHETERIZATION N/A 04/19/2015   Procedure: Coronary Angiography;  Surgeon: Peter M [name], MD;  Location: [facility] CV LAB;  Service: Cardiovascular;  Laterality: N/A;  . CAROTID ENDARTERECTOMY     lt.  . CHOLECYSTECTOMY  1999  . EXPLORATORY LAPAROTOMY  04/12/2012   small bowel perf; ileostomy & R hemicolectomy   . PACEMAKER INSERTION  03/06/2013   SJM Assurity DR PPM implanted by Dr Rayann Heman for complete heart block  . PERMANENT PACEMAKER INSERTION N/A 03/06/2013   Procedure: PERMANENT PACEMAKER INSERTION;  Surgeon: Coralyn Mark, MD;  Location: [facility] CATH LAB;  Service: Cardiovascular;  Laterality: N/A;  . TEMPORARY PACEMAKER INSERTION N/A 03/06/2013   Procedure: TEMPORARY PACEMAKER INSERTION;  Surgeon: Coralyn Mark, MD;  Location: [facility] CATH LAB;  Service: Cardiovascular;  Laterality: N/A;  . TUBAL LIGATION  11/1982    Current Outpatient Medications  Medication Sig Dispense Refill  . acetaminophen (TYLENOL) 325 MG tablet Take 650 mg by mouth every 6 (six) hours as needed for moderate pain.     [name]  albuterol (PROVENTIL HFA;VENTOLIN HFA) 108 (90 Base) MCG/ACT inhaler Inhale 2 puffs into the lungs every 6 (six) hours as needed for wheezing or shortness of breath. 3 Inhaler 1  . allopurinol (ZYLOPRIM) 100 MG tablet Take 50 mg by mouth daily.    . Calcium Carbonate (CALTRATE 600) 1500 MG TABS Take 1 tablet by mouth 2 (two) times daily.      . chlorhexidine (PERIDEX) 0.12 % solution USES ONCE EVERY FEW WEEKS IN WATER PIK AS DIRECTED  5  . cholecalciferol (VITAMIN D) 1000 UNITS tablet Take 2,000  Units by mouth daily.     [name] enoxaparin (LOVENOX) 120 MG/0.8ML injection Inject 0.8 mLs (120 mg total) into the skin every 12 (twelve) hours. As directed by coumadin clinic 18 Syringe 0  . Fluticasone-Salmeterol (ADVAIR DISKUS) 250-50 MCG/DOSE AEPB USE 1 INHALATION TWO TIMES  DAILY 3 each 2  . furosemide (LASIX) 40 MG tablet Take 1 tablet (40 mg total) by mouth daily. 30 tablet   . hydroxychloroquine (PLAQUENIL) 200 MG tablet Take 200 mg by mouth daily.    . isosorbide mononitrate (IMDUR) 30 MG 24 hr tablet TAKE 1 TABLET BY MOUTH  DAILY 90 tablet 3  . leflunomide (ARAVA) 10 MG tablet Take 10 mg by mouth daily.    [name] loratadine (CLARITIN) 10 MG tablet Take 10 mg by mouth daily.    . metoprolol succinate (TOPROL-XL) 50 MG 24 hr tablet TAKE 1 TABLET BY MOUTH  DAILY WITH OR IMMEDIATELY  FOLLOWING A MEAL 90 tablet 3  . predniSONE (DELTASONE) 5 MG tablet Take 5 mg by mouth daily.      . Probiotic Product (PROBIOTIC DAILY PO) Take 1 tablet by mouth daily.    . simvastatin (ZOCOR) 20 MG tablet Take 1 tablet by mouth at  bedtime 90 tablet 0  . warfarin (COUMADIN) 5 MG tablet TAKE 1 TO 1 AND 1/2 TABLETS BY MOUTH DAILY AS DIRECTED  BY COUMADIN CLINIC 135 tablet 1   No current facility-administered medications for this encounter.     No Known Allergies  Social History   Socioeconomic History  . Marital status: Divorced    Spouse name: Not on file  . Number of children: 3  . Years of education: Not on file  . Highest education level: Not on file  Occupational History  . Occupation: Retired  Scientific laboratory technician  . Financial resource strain: Not on file  . Food insecurity:    Worry: Not on file    Inability: Not on file  . Transportation needs:    Medical: Not on file    Non-medical: Not on file  Tobacco Use  . Smoking status: Never Smoker  . Smokeless tobacco: Never Used  Substance and Sexual Activity  . Alcohol use: No  . Drug use: No  . Sexual activity: Not on file  Lifestyle  . Physical  activity:    Days per week: Not on file    Minutes per session: Not on file  . Stress: Not on file  Relationships  . Social connections:    Talks on phone: Not on file    Gets together: Not on file    Attends religious service: Not on file    Active member of club or organization: Not on file    Attends meetings of clubs or organizations: Not on file    Relationship status: Not on file  . Intimate partner violence:    Fear of current or ex partner: Not on file  Emotionally abused: Not on file    Physically abused: Not on file    Forced sexual activity: Not on file  Other Topics Concern  . Not on file  Social History Narrative   Lives alone in [place].    Family History  Problem Relation Age of Onset  . Alzheimer's disease Father   . Cirrhosis Father   . Cancer Father        prostate  . Heart attack Neg Hx     ROS- All systems are reviewed and negative except as per the HPI above  Physical Exam: Vitals:   02/25/18 1334  BP: (!) 152/86  Pulse: 70  Weight: 121.1 kg  Height: 5\' 5"  (1.651 m)   Wt Readings from Last 3 Encounters:  02/25/18 121.1 kg  06/21/17 117.5 kg  05/23/17 126.3 kg    Labs: Lab Results  Component Value Date   NA 140 09/07/2017   K 3.7 09/07/2017   CL 103 09/07/2017   CO2 30 09/07/2017   GLUCOSE 100 (H) 09/07/2017   BUN 30 (H) 09/07/2017   CREATININE 1.30 (H) 09/07/2017   CALCIUM 9.6 09/07/2017   MG 1.9 12/21/2015   Lab Results  Component Value Date   INR 3.5 (A) 02/24/2018   Lab Results  Component Value Date   CHOL 157 06/02/2014   HDL 101 06/02/2014   LDLCALC 40 06/02/2014   TRIG 78 06/02/2014     GEN- The patient is well appearing, alert and oriented x 3 today.   Head- normocephalic, atraumatic Eyes-  Sclera clear, conjunctiva pink Ears- hearing intact Oropharynx- clear Neck- supple, no JVP Lymph- no cervical lymphadenopathy Lungs- Clear to ausculation bilaterally, normal work of breathing Heart- Regular rate  and rhythm, no murmurs, rubs or gallops, PMI not laterally displaced GI- soft, NT, ND, + BS Extremities- no clubbing, cyanosis, or edema MS- no significant deformity or atrophy Skin- no rash or lesion Psych- euthymic mood, full affect Neuro- strength and sensation are intact  EKG-a sensed, v paced at 70 bpm.  St Jude remote report reviewed   Assessment and Plan: 1. Paroxysmal afib  General education re afib By being a sensed appears to be back in rhythm today Triggers discussed Pt asymptomatic during time of afib V rates were controlled Continue metoprolol succinate 50 mg daily  2. CHA2DS2VASc score of at least 4 Continue on warfarin Already on this for h/o of mechanical heart valve  Per coumadin clinic INR 3.5 yesterday  3. PPM Per Dr. Rayann Heman and device clinic Next remote check 2/25  4. CAD No anginal symptoms  afib clinic as needed  Geroge Baseman. , [facility] [address] [place], [place] [address] [phone]

## 2018-03-07 LAB — CUP PACEART REMOTE DEVICE CHECK
Battery Remaining Longevity: 128 mo
Battery Remaining Percentage: 95.5 %
Battery Voltage: 2.99 V
Brady Statistic AP VP Percent: 16 %
Brady Statistic AP VS Percent: 1 %
Brady Statistic AS VP Percent: 84 %
Brady Statistic AS VS Percent: 1 %
Brady Statistic RA Percent Paced: 15 %
Brady Statistic RV Percent Paced: 99 %
Date Time Interrogation Session: 20191126101408
Implantable Lead Implant Date: 20150116
Implantable Lead Location: 753859
Implantable Lead Location: 753860
Implantable Lead Model: 5076
Implantable Pulse Generator Implant Date: 20150116
Lead Channel Impedance Value: 380 Ohm
Lead Channel Impedance Value: 560 Ohm
Lead Channel Pacing Threshold Amplitude: 0.625 V
Lead Channel Pacing Threshold Amplitude: 0.75 V
Lead Channel Pacing Threshold Pulse Width: 0.4 ms
Lead Channel Pacing Threshold Pulse Width: 0.4 ms
Lead Channel Sensing Intrinsic Amplitude: 12 mV
Lead Channel Setting Pacing Amplitude: 0.875
Lead Channel Setting Pacing Amplitude: 2 V
Lead Channel Setting Pacing Pulse Width: 0.4 ms
Lead Channel Setting Sensing Sensitivity: 2 mV
MDC IDC LEAD IMPLANT DT: 20150116
MDC IDC MSMT LEADCHNL RA SENSING INTR AMPL: 5 mV
Pulse Gen Model: 2240
Pulse Gen Serial Number: 7587004

## 2018-03-17 ENCOUNTER — Encounter: Payer: Self-pay | Admitting: Cardiology

## 2018-03-17 ENCOUNTER — Telehealth: Payer: Self-pay | Admitting: Cardiovascular Disease

## 2018-03-17 ENCOUNTER — Ambulatory Visit: Payer: Medicare Other | Admitting: Cardiology

## 2018-03-17 DIAGNOSIS — Z952 Presence of prosthetic heart valve: Secondary | ICD-10-CM

## 2018-03-17 DIAGNOSIS — I480 Paroxysmal atrial fibrillation: Secondary | ICD-10-CM | POA: Insufficient documentation

## 2018-03-17 DIAGNOSIS — I442 Atrioventricular block, complete: Secondary | ICD-10-CM

## 2018-03-17 DIAGNOSIS — Z0389 Encounter for observation for other suspected diseases and conditions ruled out: Secondary | ICD-10-CM

## 2018-03-17 DIAGNOSIS — I5043 Acute on chronic combined systolic (congestive) and diastolic (congestive) heart failure: Secondary | ICD-10-CM

## 2018-03-17 DIAGNOSIS — N183 Chronic kidney disease, stage 3 unspecified: Secondary | ICD-10-CM

## 2018-03-17 DIAGNOSIS — Z7901 Long term (current) use of anticoagulants: Secondary | ICD-10-CM

## 2018-03-17 DIAGNOSIS — IMO0001 Reserved for inherently not codable concepts without codable children: Secondary | ICD-10-CM

## 2018-03-17 DIAGNOSIS — I701 Atherosclerosis of renal artery: Secondary | ICD-10-CM

## 2018-03-17 MED ORDER — FUROSEMIDE 80 MG PO TABS: 80.0000 mg | ORAL_TABLET | Freq: Every day | ORAL | 2 refills | Status: DC

## 2018-03-17 NOTE — Progress Notes (Addendum)
03/17/2018 [name]   01/27/1951  [id number]  Primary Physician [name], MD Primary Cardiologist: Dr Gwenlyn Found                                    Dr Rayann Heman EP  HPI: [name] is a pleasant 68 year old female followed by Dr. Gwenlyn Found.  She has an extensive cardiac history and multiple medical problems.  In 2006 she had a [place] AVR.  She had normal coronaries at that time.  She had normal LV function up until recently.  Myoview in 2011 was low risk, and she subsequently had a diagnostic catheterization in 2017 for chest pain that showed normal coronaries.  In 2015 she had a [facility] pacemaker placed for complete heart block.  She has vascular disease with known nonobstructive right renal artery disease and a nonfunctioning left kidney.  She had a left internal carotid surgery in 2000.  She has chronic renal insufficiency stage III with a GFR in the 40s.  She has asthma and is followed by [facility].  She has chronic lupus and is on steroids and Plaquenil.  She is morbidly obese, her BMI is 45.  Her last echocardiogram was in April 2019.  Her ejection fraction was lower than previous echos - EF 45% with grade 1 diastolic dysfunction.  The left atrium was mildly dilated.  The prosthetic valve appeared to be functioning within normal limits.  She did have moderately dilated right ventricle with severe TR.  RA pressure was 16mmHg.  In January 2020 atrial fibrillation was noted on a remote pacemaker check.  The patient was unaware of this rhythm.  When she was seen by [name] in the AF clinic she was in sinus rhythm.  She is seeing me in the office today with complaints of increasing weight and increasing dyspnea on exertion.  The patient says she has gained 5 or 6 pounds just in the last week.  Her weight today is 267 pounds, in May she was 259 pounds.  She also complains of lower extremity edema.  She denies any excessive sodium intake.  She is unaware of any tachycardia or  irregular heart rhythm.  Her EKG shows a V paced rhythm.     Current Outpatient Medications  Medication Sig Dispense Refill  . acetaminophen (TYLENOL) 325 MG tablet Take 650 mg by mouth every 6 (six) hours as needed for moderate pain.     [name] albuterol (PROVENTIL HFA;VENTOLIN HFA) 108 (90 Base) MCG/ACT inhaler Inhale 2 puffs into the lungs every 6 (six) hours as needed for wheezing or shortness of breath. 3 Inhaler 1  . allopurinol (ZYLOPRIM) 100 MG tablet Take 50 mg by mouth daily.    . Calcium Carbonate (CALTRATE 600) 1500 MG TABS Take 1 tablet by mouth 2 (two) times daily.      . chlorhexidine (PERIDEX) 0.12 % solution USES ONCE EVERY FEW WEEKS IN WATER PIK AS DIRECTED  5  . cholecalciferol (VITAMIN D) 1000 UNITS tablet Take 2,000 Units by mouth daily.     [name] enoxaparin (LOVENOX) 120 MG/0.8ML injection Inject 0.8 mLs (120 mg total) into the skin every 12 (twelve) hours. As directed by coumadin clinic 18 Syringe 0  . Fluticasone-Salmeterol (ADVAIR DISKUS) 250-50 MCG/DOSE AEPB USE 1 INHALATION TWO TIMES  DAILY 3 each 2  . furosemide (LASIX) 80 MG tablet Take 1 tablet (80 mg total) by mouth  daily. 90 tablet 2  . hydroxychloroquine (PLAQUENIL) 200 MG tablet Take 200 mg by mouth daily.    . isosorbide mononitrate (IMDUR) 30 MG 24 hr tablet TAKE 1 TABLET BY MOUTH  DAILY 90 tablet 3  . leflunomide (ARAVA) 10 MG tablet Take 10 mg by mouth daily.    [name] loratadine (CLARITIN) 10 MG tablet Take 10 mg by mouth daily.    . metoprolol succinate (TOPROL-XL) 50 MG 24 hr tablet TAKE 1 TABLET BY MOUTH  DAILY WITH OR IMMEDIATELY  FOLLOWING A MEAL 90 tablet 3  . predniSONE (DELTASONE) 5 MG tablet Take 5 mg by mouth daily.      . Probiotic Product (PROBIOTIC DAILY PO) Take 1 tablet by mouth daily.    . simvastatin (ZOCOR) 20 MG tablet Take 1 tablet by mouth at  bedtime 90 tablet 0  . warfarin (COUMADIN) 5 MG tablet TAKE 1 TO 1 AND 1/2 TABLETS BY MOUTH DAILY AS DIRECTED  BY COUMADIN CLINIC 135 tablet 1   No current  facility-administered medications for this visit.     No Known Allergies  Past Medical History:  Diagnosis Date  . Antiphospholipid antibody positive 06/28/2017  . Asthma   . CAD (coronary artery disease)    a. normal cors by cath in 03/2015  . Carotid artery disease ([place])   . Chronic anticoagulation   . Complete heart block ([place]) 02/2013   a. Implantation of a dual chamber pacemaker in 02/2013 by Dr Rayann Heman. STJ model number 4944 pacemaker with model number [id number] right ventricular lead.   [name] Dyslipidemia   . H/O Doppler ultrasound 10/03/2010   carotid duplex - R ICA normal patency; L CCA-ICA bypass gradt patent common to interal cartoid bypass graft   . H/O mechanical aortic valve replacement   . History of echocardiogram 11/08/2011   EF >55%; mild-mod concentric LVH; trace aortic regurg.; LA mild-mod dilated  . History of nuclear stress test 01/20/2010   normal pattern of perfusion; low risk scan  . Hypertension   . Lupus (systemic lupus erythematosus) ([place])   . PVD (peripheral vascular disease) (HCC)    60% R renal artery stenosis; non-functioning L kidney    Social History   Socioeconomic History  . Marital status: Divorced    Spouse name: Not on file  . Number of children: 3  . Years of education: Not on file  . Highest education level: Not on file  Occupational History  . Occupation: Retired  Scientific laboratory technician  . Financial resource strain: Not on file  . Food insecurity:    Worry: Not on file    Inability: Not on file  . Transportation needs:    Medical: Not on file    Non-medical: Not on file  Tobacco Use  . Smoking status: Never Smoker  . Smokeless tobacco: Never Used  Substance and Sexual Activity  . Alcohol use: No  . Drug use: No  . Sexual activity: Not on file  Lifestyle  . Physical activity:    Days per week: Not on file    Minutes per session: Not on file  . Stress: Not on file  Relationships  . Social connections:    Talks on phone: Not on file    Gets  together: Not on file    Attends religious service: Not on file    Active member of club or organization: Not on file    Attends meetings of clubs or organizations: Not on file    Relationship status:  Not on file  . Intimate partner violence:    Fear of current or ex partner: Not on file    Emotionally abused: Not on file    Physically abused: Not on file    Forced sexual activity: Not on file  Other Topics Concern  . Not on file  Social History Narrative   Lives alone in [place].     Family History  Problem Relation Age of Onset  . Alzheimer's disease Father   . Cirrhosis Father   . Cancer Father        prostate  . Heart attack Neg Hx      Review of Systems: General: negative for chills, fever, night sweats or weight changes.  Cardiovascular: negative for chest pain, orthopnea, palpitations, paroxysmal nocturnal dyspnea  Dermatological: negative for rash Respiratory: negative for cough or wheezing Urologic: negative for hematuria Abdominal: negative for nausea, vomiting, diarrhea, bright red blood per rectum, melena, or hematemesis Neurologic: negative for visual changes, syncope, or dizziness All other systems reviewed and are otherwise negative except as noted above.    Blood pressure 112/68, pulse 95, height 5\' 5"  (1.651 m), weight 271 lb (122.9 kg).  General appearance: alert, cooperative, no distress and morbidly obese Lungs: basilar rales Heart: regular rate and rhythm and 2/6 systolic murmur Extremities: 1+ pitting edema Skin: warm and dry Neurologic: Grossly normal  EKG V-paced  ASSESSMENT AND PLAN:   Acute on chronic combined systolic and diastolic CHF (congestive heart failure) (HCC) Pt seen in the office today with 20 lb weight gain and CHF by history and on exam  PAF (paroxysmal atrial fibrillation) ([place]) PAF recently documented on remote pacemaker check. I suspect this is contributing her CHF  Normal coronary arteries 2006, 2017 cath  S/P  AVR (aortic valve replacement) St Jude AVR 2006-last echo April 2019  Long term (current) use of anticoagulants - Coumadin for mechanical AoV Coumadin for AVR  Complete heart block ([place]) S/P St Jude PTVDP Jan 2015  CRI (chronic renal insufficiency), stage 3 (moderate) (HCC) GFR 40's  Renal artery stenosis (HCC) Known non functioning Lt kidney, 60% Rt RAS  Morbid obesity (HCC) BMI 45   PLAN  Check labs today.  Increase Lasix to 80 mg daily.  Will discuss with Dr Rayann Heman and Dr Gwenlyn Found- ? Consider antiarrythmic Rx once she is volume stable.  Amiodarone not a good choice with her asthmatic COPD- not sure Sotalol would be either. ? Consider Multaq.  I'll see her in f/u in a couple of weeks.   Kerin Ransom PA-C 03/17/2018 1:57 PM   Addendum:  Reviewed by Dr Rayann Heman, he felt that her CHF was multifactoral and driven by obesity, severe TR, and dietary noncompliance. he was not convinced that given her BMI and structural heart disease that we will make major long term impact with antiarrhythmic drugs. Her only drug options would be tikosyn or amiodarone.  He advises diuresis and follow up.  If not improved we could again consider antiarrythmic and DCCV.   Kerin Ransom PA-C 03/18/2018 10:15 AM

## 2018-03-17 NOTE — Assessment & Plan Note (Signed)
BMI 45 

## 2018-03-17 NOTE — Assessment & Plan Note (Signed)
GFR 40's

## 2018-03-17 NOTE — Assessment & Plan Note (Signed)
Coumadin for AVR

## 2018-03-17 NOTE — Assessment & Plan Note (Signed)
S/P St Jude PTVDP Jan 2015

## 2018-03-17 NOTE — Assessment & Plan Note (Signed)
PAF recently documented on remote pacemaker check. I suspect this is contributing her CHF

## 2018-03-17 NOTE — Patient Instructions (Signed)
Medication Instructions:  INCREASE Lasix to 80mg  Take 1 tablet once a day If you need a refill on your cardiac medications before your next appointment, please call your pharmacy.   Lab work: Your physician recommends that you return for lab work in: TODAY-BMET, CBC, TSH, MAG If you have labs (blood work) drawn today and your tests are completely normal, you will receive your results only by: [name] MyChart Message (if you have MyChart) OR . A paper copy in the mail If you have any lab test that is abnormal or we need to change your treatment, we will call you to review the results.  Testing/Procedures: NONE   Follow-Up: At [facility], you and your health needs are our priority.  As part of our continuing mission to provide you with exceptional heart care, we have created designated Provider Care Teams.  These Care Teams include your primary Cardiologist (physician) and Advanced Practice Providers (APPs -  Physician Assistants and Nurse Practitioners) who all work together to provide you with the care you need, when you need it.  . Your physician recommends that you schedule a follow-up appointment in: [place], PA-C  Any Other Special Instructions Will Be Listed Below (If Applicable). LUKE WILL SPEAK WITH DR Rayann Heman AND WE WILL LET YOU KNOW WHAT MEDICATION CHANGES HE SUGGESTS

## 2018-03-17 NOTE — Assessment & Plan Note (Signed)
2006, 2017 cath

## 2018-03-17 NOTE — Assessment & Plan Note (Signed)
Pt seen in the office today with 20 lb weight gain and CHF by history and on exam

## 2018-03-17 NOTE — Assessment & Plan Note (Signed)
Known non functioning Lt kidney, 60% Rt RAS

## 2018-03-17 NOTE — Assessment & Plan Note (Signed)
St Jude AVR 2006-last echo April 2019

## 2018-03-18 LAB — CBC
Hematocrit: 36.7 % (ref 34.0–46.6)
Hemoglobin: 11.8 g/dL (ref 11.1–15.9)
MCH: 28.2 pg (ref 26.6–33.0)
MCHC: 32.2 g/dL (ref 31.5–35.7)
MCV: 88 fL (ref 79–97)
Platelets: 216 10*3/uL (ref 150–450)
RBC: 4.18 x10E6/uL (ref 3.77–5.28)
RDW: 14.3 % (ref 11.7–15.4)
WBC: 7.3 10*3/uL (ref 3.4–10.8)

## 2018-03-18 LAB — BASIC METABOLIC PANEL
BUN/Creatinine Ratio: 19 (ref 12–28)
BUN: 29 mg/dL — ABNORMAL HIGH (ref 8–27)
CO2: 26 mmol/L (ref 20–29)
Calcium: 9 mg/dL (ref 8.7–10.3)
Chloride: 100 mmol/L (ref 96–106)
Creatinine, Ser: 1.5 mg/dL — ABNORMAL HIGH (ref 0.57–1.00)
GFR calc Af Amer: 42 mL/min/{1.73_m2} — ABNORMAL LOW (ref >59)
GFR calc non Af Amer: 36 mL/min/{1.73_m2} — ABNORMAL LOW (ref >59)
Glucose: 104 mg/dL — ABNORMAL HIGH (ref 65–99)
Potassium: 4.6 mmol/L (ref 3.5–5.2)
Sodium: 140 mmol/L (ref 134–144)

## 2018-03-18 LAB — MAGNESIUM: Magnesium: 2 mg/dL (ref 1.6–2.3)

## 2018-03-18 LAB — TSH: TSH: 3.9 u[IU]/mL (ref 0.450–4.500)

## 2018-03-21 ENCOUNTER — Encounter: Payer: Self-pay | Admitting: Internal Medicine

## 2018-03-21 NOTE — Progress Notes (Addendum)
[place] alert for AF burden > threshold. Pt remains in persistent AF. Recent office visit notes reviewed. Appt with Dr Gwenlyn Found scheduled for 04/01/18. Will route to Dr Rayann Heman as well for review of any recommendations prior to that appointment.  Chanetta Marshall, NP 03/21/2018 3:27 PM           I have reviewed above. EKG 02/25/2018 reveals afib not sinus. I have reached out to Dr Gwenlyn Found and Lurena Joiner who will see her in the next 2 weeks.  I think that it would be best to try to get her back into sinus, though I worry with severe TR and obesity that our results may be marginal. I have advised tikosyn.  If she is willing, then Dr Gwenlyn Found can send to AF clinic for initiation after her visit with him. Thompson Grayer MD, [facility] 03/21/2018 4:17 PM

## 2018-03-25 ENCOUNTER — Ambulatory Visit (INDEPENDENT_AMBULATORY_CARE_PROVIDER_SITE_OTHER): Payer: Medicare Other | Admitting: Pharmacist Clinician (PhC)/ Clinical Pharmacy Specialist

## 2018-03-25 ENCOUNTER — Encounter (HOSPITAL_COMMUNITY): Payer: Self-pay | Admitting: Nurse Practitioner

## 2018-03-25 ENCOUNTER — Ambulatory Visit (HOSPITAL_COMMUNITY)
Admission: RE | Admit: 2018-03-25 | Discharge: 2018-03-25 | Disposition: A | Discharge status: Home / Self Care | Payer: Medicare Other | Source: Ambulatory Visit | Attending: Nurse Practitioner | Admitting: Nurse Practitioner

## 2018-03-25 ENCOUNTER — Telehealth: Payer: Self-pay | Admitting: Pharmacist

## 2018-03-25 VITALS — BP 142/80 | HR 76 | Ht 65.0 in | Wt 261.0 lb

## 2018-03-25 DIAGNOSIS — I701 Atherosclerosis of renal artery: Secondary | ICD-10-CM | POA: Insufficient documentation

## 2018-03-25 DIAGNOSIS — I081 Rheumatic disorders of both mitral and tricuspid valves: Secondary | ICD-10-CM | POA: Insufficient documentation

## 2018-03-25 DIAGNOSIS — Z9049 Acquired absence of other specified parts of digestive tract: Secondary | ICD-10-CM | POA: Diagnosis not present

## 2018-03-25 DIAGNOSIS — Z950 Presence of cardiac pacemaker: Secondary | ICD-10-CM | POA: Diagnosis not present

## 2018-03-25 DIAGNOSIS — I739 Peripheral vascular disease, unspecified: Secondary | ICD-10-CM | POA: Diagnosis not present

## 2018-03-25 DIAGNOSIS — I2510 Atherosclerotic heart disease of native coronary artery without angina pectoris: Secondary | ICD-10-CM | POA: Insufficient documentation

## 2018-03-25 DIAGNOSIS — Z5181 Encounter for therapeutic drug level monitoring: Secondary | ICD-10-CM | POA: Diagnosis not present

## 2018-03-25 DIAGNOSIS — Z7952 Long term (current) use of systemic steroids: Secondary | ICD-10-CM | POA: Diagnosis not present

## 2018-03-25 DIAGNOSIS — I442 Atrioventricular block, complete: Secondary | ICD-10-CM | POA: Diagnosis not present

## 2018-03-25 DIAGNOSIS — Z8042 Family history of malignant neoplasm of prostate: Secondary | ICD-10-CM | POA: Insufficient documentation

## 2018-03-25 DIAGNOSIS — I272 Pulmonary hypertension, unspecified: Secondary | ICD-10-CM | POA: Diagnosis not present

## 2018-03-25 DIAGNOSIS — E785 Hyperlipidemia, unspecified: Secondary | ICD-10-CM | POA: Diagnosis not present

## 2018-03-25 DIAGNOSIS — I480 Paroxysmal atrial fibrillation: Secondary | ICD-10-CM

## 2018-03-25 DIAGNOSIS — M329 Systemic lupus erythematosus, unspecified: Secondary | ICD-10-CM | POA: Diagnosis not present

## 2018-03-25 DIAGNOSIS — R9431 Abnormal electrocardiogram [ECG] [EKG]: Secondary | ICD-10-CM | POA: Insufficient documentation

## 2018-03-25 DIAGNOSIS — J45909 Unspecified asthma, uncomplicated: Secondary | ICD-10-CM | POA: Diagnosis not present

## 2018-03-25 DIAGNOSIS — Z952 Presence of prosthetic heart valve: Secondary | ICD-10-CM

## 2018-03-25 DIAGNOSIS — Z7901 Long term (current) use of anticoagulants: Secondary | ICD-10-CM | POA: Insufficient documentation

## 2018-03-25 DIAGNOSIS — I10 Essential (primary) hypertension: Secondary | ICD-10-CM | POA: Insufficient documentation

## 2018-03-25 DIAGNOSIS — I4819 Other persistent atrial fibrillation: Secondary | ICD-10-CM | POA: Insufficient documentation

## 2018-03-25 DIAGNOSIS — Z79899 Other long term (current) drug therapy: Secondary | ICD-10-CM | POA: Diagnosis not present

## 2018-03-25 LAB — BASIC METABOLIC PANEL
Anion gap: 12 (ref 5–15)
BUN: 39 mg/dL — ABNORMAL HIGH (ref 8–23)
CO2: 31 mmol/L (ref 22–32)
Calcium: 10.1 mg/dL (ref 8.9–10.3)
Chloride: 97 mmol/L — ABNORMAL LOW (ref 98–111)
Creatinine, Ser: 2.02 mg/dL — ABNORMAL HIGH (ref 0.44–1.00)
GFR calc Af Amer: 29 mL/min — ABNORMAL LOW (ref >60)
GFR calc non Af Amer: 25 mL/min — ABNORMAL LOW (ref >60)
Glucose, Bld: 113 mg/dL — ABNORMAL HIGH (ref 70–99)
Potassium: 4 mmol/L (ref 3.5–5.1)
Sodium: 140 mmol/L (ref 135–145)

## 2018-03-25 LAB — PROTIME-INR
INR: 4.59
PROTHROMBIN TIME: 42.7 s — AB (ref 11.4–15.2)

## 2018-03-25 LAB — MAGNESIUM: Magnesium: 2 mg/dL (ref 1.7–2.4)

## 2018-03-25 NOTE — Telephone Encounter (Signed)
Medication list reviewed in anticipation of upcoming Tikosyn initiation. Patient is taking hydroxychloroquine which is QTc prolonging. Will need to monitor QTc closely during Tikosyn initiation.  Patient is anticoagulated on warfarin and will require 4 weekly therapeutic INR checks. She is managed at the [facility] office - will send a message to their pharmacists to coordinate INRs.  K > 4 and Mg > 1.8 when checked last week, both acceptable for Tikosyn start.  Patient will need to be counseled to avoid use of Benadryl while on Tikosyn and in the 2-3 days prior to Tikosyn initiation.

## 2018-03-25 NOTE — Progress Notes (Signed)
Primary Care Physician: [name], MD Referring Physician:Dr. Jerl Santos Vanosdol is a 68 y.o. female with a h/o PPM,CAD, mechanical  AVR on warfarin, persistent afib for the last 30 days per [facility] device alert brought to the attention of [facility], NP/ Dr. Rayann Heman. He feels that is is worth a try to try pt on tikosyn as she is having more issues with edema and shortness of breath in the last several weeks.  He is concerned with her obesity and severe TR that the results may be marginal. The pt is informed of dofetilide admission and what is to be expected with hospitalization for same. She is interested in pursing. EKG shows v paced today at 76 bpm.   Today, she denies symptoms of palpitations, chest pain, shortness of breath, orthopnea, PND, lower extremity edema, dizziness, presyncope, syncope, or neurologic sequela. The patient is tolerating medications without difficulties and is otherwise without complaint today.   Past Medical History:  Diagnosis Date  . Antiphospholipid antibody positive 06/28/2017  . Asthma   . CAD (coronary artery disease)    a. normal cors by cath in 03/2015  . Carotid artery disease ([place])   . Chronic anticoagulation   . Complete heart block ([place]) 02/2013   a. Implantation of a dual chamber pacemaker in 02/2013 by Dr Rayann Heman. STJ model number 9629 pacemaker with model number 2088 right ventricular lead.   [name] Dyslipidemia   . H/O Doppler ultrasound 10/03/2010   carotid duplex - R ICA normal patency; L CCA-ICA bypass gradt patent common to interal cartoid bypass graft   . H/O mechanical aortic valve replacement   . History of echocardiogram 11/08/2011   EF >55%; mild-mod concentric LVH; trace aortic regurg.; LA mild-mod dilated  . History of nuclear stress test 01/20/2010   normal pattern of perfusion; low risk scan  . Hypertension   . Lupus (systemic lupus erythematosus) ([place])   . PVD (peripheral vascular disease) (HCC)    60% R renal artery stenosis;  non-functioning L kidney   Past Surgical History:  Procedure Laterality Date  . AORTIC VALVE REPLACEMENT  11/29/006   [place]; [place]; on coumadin  . CARDIAC CATHETERIZATION  11/01/2004   define anatomy   . CARDIAC CATHETERIZATION N/A 04/19/2015   Procedure: Coronary Angiography;  Surgeon: Peter M [name], MD;  Location: [facility];  Service: Cardiovascular;  Laterality: N/A;  . CAROTID ENDARTERECTOMY     lt.  . CHOLECYSTECTOMY  1999  . EXPLORATORY LAPAROTOMY  04/12/2012   small bowel perf; ileostomy & R hemicolectomy   . PACEMAKER INSERTION  03/06/2013   SJM Assurity DR PPM implanted by Dr Rayann Heman for complete heart block  . PERMANENT PACEMAKER INSERTION N/A 03/06/2013   Procedure: PERMANENT PACEMAKER INSERTION;  Surgeon: Coralyn Mark, MD;  Location: [facility] CATH LAB;  Service: Cardiovascular;  Laterality: N/A;  . TEMPORARY PACEMAKER INSERTION N/A 03/06/2013   Procedure: TEMPORARY PACEMAKER INSERTION;  Surgeon: Coralyn Mark, MD;  Location: [facility] CATH LAB;  Service: Cardiovascular;  Laterality: N/A;  . TUBAL LIGATION  11/1982    Current Outpatient Medications  Medication Sig Dispense Refill  . acetaminophen (TYLENOL) 325 MG tablet Take 650 mg by mouth every 6 (six) hours as needed for moderate pain.     [name] albuterol (PROVENTIL HFA;VENTOLIN HFA) 108 (90 Base) MCG/ACT inhaler Inhale 2 puffs into the lungs every 6 (six) hours as needed for wheezing or shortness of breath. 3 Inhaler 1  . allopurinol (ZYLOPRIM) 100  MG tablet Take 50 mg by mouth daily.    . Calcium Carbonate (CALTRATE 600) 1500 MG TABS Take 1 tablet by mouth 2 (two) times daily.      . chlorhexidine (PERIDEX) 0.12 % solution USES ONCE EVERY FEW WEEKS IN WATER PIK AS DIRECTED  5  . cholecalciferol (VITAMIN D) 1000 UNITS tablet Take 2,000 Units by mouth daily.     . Fluticasone-Salmeterol (ADVAIR DISKUS) 250-50 MCG/DOSE AEPB USE 1 INHALATION TWO TIMES  DAILY 3 each 2  . furosemide (LASIX) 40 MG tablet Take 40 mg by mouth  daily.    . hydroxychloroquine (PLAQUENIL) 200 MG tablet Take 200 mg by mouth daily.    . isosorbide mononitrate (IMDUR) 30 MG 24 hr tablet TAKE 1 TABLET BY MOUTH  DAILY 90 tablet 3  . leflunomide (ARAVA) 10 MG tablet Take 10 mg by mouth daily.    [name] loratadine (CLARITIN) 10 MG tablet Take 10 mg by mouth daily.    . metoprolol succinate (TOPROL-XL) 50 MG 24 hr tablet TAKE 1 TABLET BY MOUTH  DAILY WITH OR IMMEDIATELY  FOLLOWING A MEAL 90 tablet 3  . predniSONE (DELTASONE) 5 MG tablet Take 5 mg by mouth daily.      . Probiotic Product (PROBIOTIC DAILY PO) Take 1 tablet by mouth daily.    . simvastatin (ZOCOR) 20 MG tablet Take 1 tablet by mouth at  bedtime 90 tablet 0  . warfarin (COUMADIN) 5 MG tablet TAKE 1 TO 1 AND 1/2 TABLETS BY MOUTH DAILY AS DIRECTED  BY COUMADIN CLINIC 135 tablet 1   No current facility-administered medications for this encounter.     No Known Allergies  Social History   Socioeconomic History  . Marital status: Divorced    Spouse name: Not on file  . Number of children: 3  . Years of education: Not on file  . Highest education level: Not on file  Occupational History  . Occupation: Retired  Scientific laboratory technician  . Financial resource strain: Not on file  . Food insecurity:    Worry: Not on file    Inability: Not on file  . Transportation needs:    Medical: Not on file    Non-medical: Not on file  Tobacco Use  . Smoking status: Never Smoker  . Smokeless tobacco: Never Used  Substance and Sexual Activity  . Alcohol use: No  . Drug use: No  . Sexual activity: Not on file  Lifestyle  . Physical activity:    Days per week: Not on file    Minutes per session: Not on file  . Stress: Not on file  Relationships  . Social connections:    Talks on phone: Not on file    Gets together: Not on file    Attends religious service: Not on file    Active member of club or organization: Not on file    Attends meetings of clubs or organizations: Not on file    Relationship  status: Not on file  . Intimate partner violence:    Fear of current or ex partner: Not on file    Emotionally abused: Not on file    Physically abused: Not on file    Forced sexual activity: Not on file  Other Topics Concern  . Not on file  Social History Narrative   Lives alone in [place].    Family History  Problem Relation Age of Onset  . Alzheimer's disease Father   . Cirrhosis Father   . Cancer  Father        prostate  . Heart attack Neg Hx     ROS- All systems are reviewed and negative except as per the HPI above  Physical Exam: Vitals:   03/25/18 1323  BP: (!) 142/80  Pulse: 76  Weight: 118.4 kg  Height: 5\' 5"  (1.651 m)   Wt Readings from Last 3 Encounters:  03/25/18 118.4 kg  03/17/18 122.9 kg  02/25/18 121.1 kg    Labs: Lab Results  Component Value Date   NA 140 03/17/2018   K 4.6 03/17/2018   CL 100 03/17/2018   CO2 26 03/17/2018   GLUCOSE 104 (H) 03/17/2018   BUN 29 (H) 03/17/2018   CREATININE 1.50 (H) 03/17/2018   CALCIUM 9.0 03/17/2018   MG 2.0 03/17/2018   Lab Results  Component Value Date   INR 3.5 (H) 02/24/2018   Lab Results  Component Value Date   CHOL 157 06/02/2014   HDL 101 06/02/2014   LDLCALC 40 06/02/2014   TRIG 78 06/02/2014     GEN- The patient is well appearing, alert and oriented x 3 today.   Head- normocephalic, atraumatic Eyes-  Sclera clear, conjunctiva pink Ears- hearing intact Oropharynx- clear Neck- supple, no JVP Lymph- no cervical lymphadenopathy Lungs- Clear to ausculation bilaterally, normal work of breathing Heart- Regular rate and rhythm, no murmurs, rubs or gallops, PMI not laterally displaced GI- soft, NT, ND, + BS Extremities- no clubbing, cyanosis, or edema MS- no significant deformity or atrophy Skin- no rash or lesion Psych- euthymic mood, full affect Neuro- strength and sensation are intact  EKG-v paced rhythm at 76 bpm Echo-Study Conclusions  - Left ventricle: The cavity size was  mildly dilated. Wall   thickness was increased in a pattern of mild LVH. Marked   septal-lateral dyssynchrony with septal hypokinesis. The   estimated ejection fraction was 45%. Doppler parameters are   consistent with abnormal left ventricular relaxation (grade 1   diastolic dysfunction). - Aortic valve: Mechanical aortic valve. Mean gradient not   significantly elevated across the aortic valve. There was trivial   regurgitation. Mean gradient (S): 16 mm Hg. - Mitral valve: Mildly calcified annulus. There was moderate   regurgitation. - Left atrium: The atrium was mildly dilated. - Right ventricle: The cavity size was moderately dilated. Pacer   wire or catheter noted in right ventricle. Systolic function was   normal. - Right atrium: The atrium was moderately dilated. - Tricuspid valve: There was severe regurgitation with incomplete   coaptation of the tricuspid leaflets. Peak RV-RA gradient (S): 27   mm Hg. - Pulmonary arteries: PA peak pressure: 42 mm Hg (S). - Systemic veins: IVC measured 2.8 cm with < 50% respirophasic   variation, suggesting RA pressure 15 mmHg.  Impressions:  - Mildly dilated LV with EF 45%. Septal-lateral dyssynchrony with   septal hypokinesis. Moderately dilated RV with normal systolic   function. Moderate mitral regurgitation. Severe tricuspid   regurgitation with incomplete coaptation of tricuspid leaflets.   Mechanical aortic valve appeared to function normally. Mild   pulmonary hypertension.    Assessment and Plan: 1. Persistent  afib  Present for the last 30 days associated with edema, shortness of breath Discussed a Tikosyn admit with the pt to try to restore SR She is in agreement  will check on the price of the drug, but she does have the good rx app and can get  for $29.94 if discharged on 500 mcgs bid  No benadryl  use Will draw bmet/mag today as she has recently had her lasix increased to 80 mg for a few days, now back to 40 mg daily She  is on warfarin, I discussed with Dr. Rayann Heman and because all of her INR's have been either therapeutic or supra therpeutic for the last 6 months, he feels that the weekly 4 INR's can be waived and TEE is not needed Drugs screened by PharmD and she is on Plaquenil which is QT prolonging,  but PharmD did not say that it was contraindicated  We are tentatively looking at 2/11 for admission Bmet/cbc/INR today  Butch Penny C. Alexa Golebiewski, [facility] [address] [place], [place] [address] [phone]

## 2018-03-26 NOTE — Telephone Encounter (Signed)
Per Dr. Rayann Heman patient has been therapeutic for at least last 4 months on INRs. Weekly INRs not required nor TEE prior to admission for tikosyn.

## 2018-04-01 ENCOUNTER — Other Ambulatory Visit: Payer: Self-pay

## 2018-04-01 ENCOUNTER — Inpatient Hospital Stay (HOSPITAL_COMMUNITY)
Admission: RE | Admit: 2018-04-01 | Discharge: 2018-04-04 | DRG: 309 | Disposition: A | Discharge status: Home / Self Care | Payer: Medicare Other | Source: Ambulatory Visit | Attending: Internal Medicine | Admitting: Internal Medicine

## 2018-04-01 ENCOUNTER — Ambulatory Visit: Payer: Medicare Other | Admitting: Cardiovascular Disease

## 2018-04-01 ENCOUNTER — Encounter (HOSPITAL_COMMUNITY): Payer: Self-pay | Admitting: Nurse Practitioner

## 2018-04-01 ENCOUNTER — Ambulatory Visit (HOSPITAL_COMMUNITY)
Admission: RE | Admit: 2018-04-01 | Discharge: 2018-04-01 | Disposition: A | Discharge status: Home / Self Care | Payer: Medicare Other | Source: Ambulatory Visit | Attending: Nurse Practitioner | Admitting: Nurse Practitioner

## 2018-04-01 VITALS — BP 118/78 | HR 83 | Ht 65.0 in | Wt 261.0 lb

## 2018-04-01 DIAGNOSIS — I4819 Other persistent atrial fibrillation: Secondary | ICD-10-CM

## 2018-04-01 DIAGNOSIS — I081 Rheumatic disorders of both mitral and tricuspid valves: Secondary | ICD-10-CM | POA: Diagnosis present

## 2018-04-01 DIAGNOSIS — I129 Hypertensive chronic kidney disease with stage 1 through stage 4 chronic kidney disease, or unspecified chronic kidney disease: Secondary | ICD-10-CM | POA: Diagnosis present

## 2018-04-01 DIAGNOSIS — Z952 Presence of prosthetic heart valve: Secondary | ICD-10-CM | POA: Diagnosis not present

## 2018-04-01 DIAGNOSIS — Z950 Presence of cardiac pacemaker: Secondary | ICD-10-CM | POA: Diagnosis not present

## 2018-04-01 DIAGNOSIS — N179 Acute kidney failure, unspecified: Secondary | ICD-10-CM | POA: Diagnosis present

## 2018-04-01 DIAGNOSIS — I2510 Atherosclerotic heart disease of native coronary artery without angina pectoris: Secondary | ICD-10-CM | POA: Diagnosis present

## 2018-04-01 DIAGNOSIS — R0602 Shortness of breath: Secondary | ICD-10-CM | POA: Diagnosis present

## 2018-04-01 DIAGNOSIS — I272 Pulmonary hypertension, unspecified: Secondary | ICD-10-CM | POA: Diagnosis present

## 2018-04-01 DIAGNOSIS — N184 Chronic kidney disease, stage 4 (severe): Secondary | ICD-10-CM | POA: Diagnosis present

## 2018-04-01 DIAGNOSIS — I4892 Unspecified atrial flutter: Secondary | ICD-10-CM | POA: Diagnosis present

## 2018-04-01 DIAGNOSIS — I739 Peripheral vascular disease, unspecified: Secondary | ICD-10-CM | POA: Diagnosis present

## 2018-04-01 DIAGNOSIS — Z7901 Long term (current) use of anticoagulants: Secondary | ICD-10-CM

## 2018-04-01 DIAGNOSIS — E785 Hyperlipidemia, unspecified: Secondary | ICD-10-CM | POA: Diagnosis present

## 2018-04-01 DIAGNOSIS — M329 Systemic lupus erythematosus, unspecified: Secondary | ICD-10-CM | POA: Diagnosis present

## 2018-04-01 DIAGNOSIS — N189 Chronic kidney disease, unspecified: Secondary | ICD-10-CM | POA: Diagnosis not present

## 2018-04-01 DIAGNOSIS — J45909 Unspecified asthma, uncomplicated: Secondary | ICD-10-CM | POA: Diagnosis present

## 2018-04-01 DIAGNOSIS — I701 Atherosclerosis of renal artery: Secondary | ICD-10-CM | POA: Diagnosis present

## 2018-04-01 DIAGNOSIS — I442 Atrioventricular block, complete: Secondary | ICD-10-CM | POA: Diagnosis present

## 2018-04-01 DIAGNOSIS — I4891 Unspecified atrial fibrillation: Secondary | ICD-10-CM | POA: Diagnosis present

## 2018-04-01 DIAGNOSIS — N289 Disorder of kidney and ureter, unspecified: Secondary | ICD-10-CM | POA: Diagnosis not present

## 2018-04-01 LAB — BASIC METABOLIC PANEL
Anion gap: 10 (ref 5–15)
BUN: 41 mg/dL — ABNORMAL HIGH (ref 8–23)
CO2: 28 mmol/L (ref 22–32)
Calcium: 9.3 mg/dL (ref 8.9–10.3)
Chloride: 103 mmol/L (ref 98–111)
Creatinine, Ser: 2.16 mg/dL — ABNORMAL HIGH (ref 0.44–1.00)
GFR calc Af Amer: 27 mL/min — ABNORMAL LOW (ref >60)
GFR calc non Af Amer: 23 mL/min — ABNORMAL LOW (ref >60)
GLUCOSE: 94 mg/dL (ref 70–99)
Potassium: 4 mmol/L (ref 3.5–5.1)
Sodium: 141 mmol/L (ref 135–145)

## 2018-04-01 LAB — PROTIME-INR
INR: 4.58
Prothrombin Time: 42.6 seconds — ABNORMAL HIGH (ref 11.4–15.2)

## 2018-04-01 LAB — MAGNESIUM: Magnesium: 2 mg/dL (ref 1.7–2.4)

## 2018-04-01 MED ORDER — CALCIUM CARBONATE 1250 (500 CA) MG PO TABS: 1.0000 | ORAL_TABLET | Freq: Two times a day (BID) | ORAL | Status: DC

## 2018-04-01 MED ORDER — DOFETILIDE 250 MCG PO CAPS
250.0000 ug | ORAL_CAPSULE | Freq: Two times a day (BID) | ORAL | Status: DC
  Administered 2018-04-01 – 2018-04-02 (×2): 250 ug via ORAL
  Filled 2018-04-01 (×2): qty 1

## 2018-04-01 MED ORDER — LORATADINE 10 MG PO TABS: 10.0000 mg | ORAL_TABLET | Freq: Every day | ORAL | Status: DC

## 2018-04-01 MED ORDER — SODIUM CHLORIDE 0.9% FLUSH
3.0000 mL | Freq: Two times a day (BID) | INTRAVENOUS | Status: DC
  Administered 2018-04-01: 3 mL via INTRAVENOUS

## 2018-04-01 MED ORDER — ALLOPURINOL 100 MG PO TABS: 100.0000 mg | ORAL_TABLET | Freq: Every day | ORAL | Status: DC

## 2018-04-01 MED ORDER — FUROSEMIDE 40 MG PO TABS: 40.0000 mg | ORAL_TABLET | Freq: Every day | ORAL | Status: DC

## 2018-04-01 MED ORDER — PREDNISONE 5 MG PO TABS
5.0000 mg | ORAL_TABLET | Freq: Every day | ORAL | Status: DC
  Administered 2018-04-02: 5 mg via ORAL
  Filled 2018-04-01 (×2): qty 1

## 2018-04-01 MED ORDER — ISOSORBIDE MONONITRATE ER 30 MG PO TB24: 30.0000 mg | ORAL_TABLET | Freq: Every day | ORAL | Status: DC

## 2018-04-01 MED ORDER — SIMVASTATIN 20 MG PO TABS
20.0000 mg | ORAL_TABLET | Freq: Every day | ORAL | Status: DC
  Administered 2018-04-01: 20 mg via ORAL
  Filled 2018-04-01: qty 1

## 2018-04-01 MED ORDER — HYDROXYCHLOROQUINE SULFATE 200 MG PO TABS
200.0000 mg | ORAL_TABLET | Freq: Every day | ORAL | Status: DC
  Administered 2018-04-02: 200 mg via ORAL
  Filled 2018-04-01 (×2): qty 1

## 2018-04-01 MED ORDER — SODIUM CHLORIDE 0.9 % IV SOLN: 250.0000 mL | INTRAVENOUS | Status: DC | PRN

## 2018-04-01 MED ORDER — SODIUM CHLORIDE 0.9% FLUSH: 3.0000 mL | INTRAVENOUS | Status: DC | PRN

## 2018-04-01 MED ORDER — METOPROLOL SUCCINATE ER 50 MG PO TB24
50.0000 mg | ORAL_TABLET | Freq: Every day | ORAL | Status: DC
  Administered 2018-04-02: 50 mg via ORAL
  Filled 2018-04-01 (×2): qty 1

## 2018-04-01 MED ORDER — MAGNESIUM SULFATE 2 GM/50ML IV SOLN
2.0000 g | Freq: Once | INTRAVENOUS | Status: AC
  Administered 2018-04-01: 2 g via INTRAVENOUS
  Filled 2018-04-01: qty 50

## 2018-04-01 MED ORDER — VITAMIN D 25 MCG (1000 UNIT) PO TABS: 2000.0000 [IU] | ORAL_TABLET | Freq: Every day | ORAL | Status: DC

## 2018-04-01 MED ORDER — ACETAMINOPHEN 325 MG PO TABS: 650.0000 mg | ORAL_TABLET | Freq: Four times a day (QID) | ORAL | Status: DC | PRN

## 2018-04-01 MED ORDER — FLUTICASONE FUROATE-VILANTEROL 200-25 MCG/INH IN AEPB
1.0000 | INHALATION_SPRAY | Freq: Every day | RESPIRATORY_TRACT | Status: DC
  Administered 2018-04-02 – 2018-04-04 (×3): 1 via RESPIRATORY_TRACT
  Filled 2018-04-01: qty 28

## 2018-04-01 MED ORDER — LEFLUNOMIDE 20 MG PO TABS
10.0000 mg | ORAL_TABLET | Freq: Every day | ORAL | Status: DC
  Filled 2018-04-01: qty 0.5

## 2018-04-01 NOTE — Progress Notes (Addendum)
ANTICOAGULATION CONSULT NOTE - Initial Consult  Pharmacy Consult for Warfarin Indication: atrial fibrillation  No Known Allergies  Patient Measurements: Height: 5\' 5"  (165.1 cm) Weight: 260 lb 14.4 oz (118.3 kg) IBW/kg (Calculated) : 57  Vital Signs: Temp: 97.5 F (36.4 C) (02/11 1525) Temp Source: Oral (02/11 1525) BP: 120/70 (02/11 1525) Pulse Rate: 70 (02/11 1525)  Labs: Recent Labs    04/01/18 1005  LABPROT 42.6*  INR 4.58*  CREATININE 2.16*    Estimated Creatinine Clearance: 33.4 mL/min (A) (by C-G formula based on SCr of 2.16 mg/dL (H)).   Medical History: Past Medical History:  Diagnosis Date  . Antiphospholipid antibody positive 06/28/2017  . Asthma   . CAD (coronary artery disease)    a. normal cors by cath in 03/2015  . Carotid artery disease ([place])   . Chronic anticoagulation   . Complete heart block ([place]) 02/2013   a. Implantation of a dual chamber pacemaker in 02/2013 by Dr Rayann Heman. STJ model number 7824 pacemaker with model number [id number] right ventricular lead.   [name] Dyslipidemia   . H/O Doppler ultrasound 10/03/2010   carotid duplex - R ICA normal patency; L CCA-ICA bypass gradt patent common to interal cartoid bypass graft   . H/O mechanical aortic valve replacement   . History of echocardiogram 11/08/2011   EF >55%; mild-mod concentric LVH; trace aortic regurg.; LA mild-mod dilated  . History of nuclear stress test 01/20/2010   normal pattern of perfusion; low risk scan  . Hypertension   . Lupus (systemic lupus erythematosus) ([place])   . PVD (peripheral vascular disease) ([facility])    60% R renal artery stenosis; non-functioning L kidney    Assessment: 68 year old female on warfarin prior to admission for Afib, admitted today for Tikosyn initiation Admission INR = 4.58  Goal of Therapy:  INR 2-3? Monitor platelets by anticoagulation protocol: Yes   Plan:  No warfarin today Daily INR  Thank you Anette Guarneri, PharmD  04/01/2018,3:40 PM

## 2018-04-01 NOTE — Progress Notes (Signed)
Pharmacy Consult for [place] Electrolyte Replacement  Pharmacy consulted to assist in monitoring and replacing electrolytes in this 68 y.o. female admitted on 04/01/2018 undergoing dofetilide initiation. First dofetilide dose: 2/11 at 8 PM  Labs:    Component Value Date/Time   K 4.0 04/01/2018 1005   MG 2.0 04/01/2018 1005     Plan: Potassium: K >/= 4: Appropriate to initiate Tikosyn    Magnesium: Mg 1.8-2: Mg 2 gm IV x1 to prevent Mg from dropping below 1.8 - do not need to recheck Mg. Appropriate to initiate Tikosyn   ----------------------------------------------  Pharmacy Review for Dofetilide (Tikosyn) Initiation  Admit Complaint: 68 y.o. female admitted 04/01/2018 with atrial fibrillation to be initiated on dofetilide.   Assessment:  Patient Exclusion Criteria: If any screening criteria checked as "Yes", then  patient  should NOT receive dofetilide until criteria item is corrected. If "Yes" please indicate correction plan.  YES  NO Patient  Exclusion Criteria Correction Plan  []  [x]  Baseline QTc interval is greater than or equal to 440 msec. IF above YES box checked dofetilide contraindicated unless patient has ICD; then may proceed if QTc 500-550 msec or with known ventricular conduction abnormalities may proceed with QTc 550-600 msec. QTc = 495, review by Dr. Rayann Heman, okay to initiation   []  [x]  Magnesium level is less than 1.8 mEq/l : Last magnesium:  Lab Results  Component Value Date   MG 2.0 04/01/2018         []  [x]  Potassium level is less than 4 mEq/l : Last potassium:  Lab Results  Component Value Date   K 4.0 04/01/2018         []  [x]  Patient is known or suspected to have a digoxin level greater than 2 ng/ml: No results found for: DIGOXIN    []  [x]  Creatinine clearance less than 20 ml/min (calculated using Cockcroft-Gault, actual body weight and serum creatinine): Estimated Creatinine Clearance: 33.4 mL/min (A) (by C-G formula based on SCr of 2.16 mg/dL  (H)).   CrCl: 48 mL/min utilizing actual body weight     []  [x]  Patient has received drugs known to prolong the QT intervals within the last 48 hours (phenothiazines, tricyclics or tetracyclic antidepressants, erythromycin, H-1 antihistamines, cisapride, fluoroquinolones, azithromycin). Drugs not listed above may have an, as yet, undetected potential to prolong the QT interval, updated information on QT prolonging agents is available at this website:QT prolonging agents   []  [x]  Patient received a dose of hydrochlorothiazide (Oretic) alone or in any combination including triamterene (Dyazide, Maxzide) in the last 48 hours.   []  [x]  Patient received a medication known to increase dofetilide plasma concentrations prior to initial dofetilide dose:  . Trimethoprim (Primsol, Proloprim) in the last 36 hours . Verapamil (Calan, Verelan) in the last 36 hours or a sustained release dose in the last 72 hours . Megestrol (Megace) in the last 5 days  . Cimetidine (Tagamet) in the last 6 hours . Ketoconazole (Nizoral) in the last 24 hours . Itraconazole (Sporanox) in the last 48 hours  . Prochlorperazine (Compazine) in the last 36 hours    []  [x]  Patient is known to have a history of torsades de pointes; congenital or acquired long QT syndromes.   []  [x]  Patient has received a Class 1 antiarrhythmic with less than 2 half-lives since last dose. (Disopyramide, Quinidine, Procainamide, Lidocaine, Mexiletine, Flecainide, Propafenone)   []  [x]  Patient has received amiodarone therapy in the past 3 months or amiodarone level is greater than 0.3 ng/ml.  Patient has been appropriately anticoagulated with warfarin, INR has been supratherapeutic, okay per [facility].   Goal of Therapy: Follow renal function, electrolytes, potential drug interactions, and dose adjustment. Provide education and 1 week supply at discharge.  Plan:  [x]   Physician selected initial dose within range recommended for patients level of renal  function - will monitor for response.  []   Physician selected initial dose outside of range recommended for patients level of renal function - will discuss if the dose should be altered at this time.   Select One Calculated CrCl  Dose q12h  []  > 60 ml/min 500 mcg  [x]  40-60 ml/min 250 mcg  []  20-40 ml/min 125 mcg   2. Follow up QTc after the first 5 doses, renal function, electrolytes (K & Mg) daily x 3     days, dose adjustment, success of initiation and facilitate 1 week discharge supply as     clinically indicated.  3. Initiate Tikosyn education video (Call [phone] and ask for Tikosyn Video # [id number]).  4. Place Enrollment Form on the chart for discharge supply of dofetilide.    Thank you for allowing pharmacy to participate in this patient's care   Claiborne Billings, PharmD PGY2 Cardiology Pharmacy Resident Please check AMION for all Pharmacist numbers by unit 04/01/2018 4:00 PM

## 2018-04-01 NOTE — Progress Notes (Signed)
Primary Care Physician: [name], MD Referring Physician:Dr. Jerl Santos Nault is a 68 y.o. female with a h/o PPM,CAD, mechanical  AVR on warfarin, persistent afib for the last 30 days per [facility] device alert brought to the attention of [facility], NP/ Dr. Rayann Heman. He feels that is is worth a try to try pt on tikosyn as she is having more issues with edema and shortness of breath in the last several weeks.  He is concerned with her obesity and severe TR that the results may be marginal. The pt is informed of dofetilide admission and what is to be expected with hospitalization for same. She is interested in pursing. EKG shows v paced today at 76 bpm.   F/u in afib clinic, 2/11, she is here for dofetilide admit. She plans to go thru [facility] for her med.  Today, she denies symptoms of palpitations, chest pain, shortness of breath, orthopnea, PND, lower extremity edema, dizziness, presyncope, syncope, or neurologic sequela. The patient is tolerating medications without difficulties and is otherwise without complaint today.   Past Medical History:  Diagnosis Date  . Antiphospholipid antibody positive 06/28/2017  . Asthma   . CAD (coronary artery disease)    a. normal cors by cath in 03/2015  . Carotid artery disease ([place])   . Chronic anticoagulation   . Complete heart block ([place]) 02/2013   a. Implantation of a dual chamber pacemaker in 02/2013 by Dr Rayann Heman. STJ model number 8341 pacemaker with model number [id number] right ventricular lead.   [name] Dyslipidemia   . H/O Doppler ultrasound 10/03/2010   carotid duplex - R ICA normal patency; L CCA-ICA bypass gradt patent common to interal cartoid bypass graft   . H/O mechanical aortic valve replacement   . History of echocardiogram 11/08/2011   EF >55%; mild-mod concentric LVH; trace aortic regurg.; LA mild-mod dilated  . History of nuclear stress test 01/20/2010   normal pattern of perfusion; low risk scan  . Hypertension   . Lupus (systemic  lupus erythematosus) ([place])   . PVD (peripheral vascular disease) ([facility])    60% R renal artery stenosis; non-functioning L kidney   Past Surgical History:  Procedure Laterality Date  . AORTIC VALVE REPLACEMENT  11/29/006   [place]; [place]; on coumadin  . CARDIAC CATHETERIZATION  11/01/2004   define anatomy   . CARDIAC CATHETERIZATION N/A 04/19/2015   Procedure: Coronary Angiography;  Surgeon: Peter M [name], MD;  Location: [place] CV LAB;  Service: Cardiovascular;  Laterality: N/A;  . CAROTID ENDARTERECTOMY     lt.  . CHOLECYSTECTOMY  1999  . EXPLORATORY LAPAROTOMY  04/12/2012   small bowel perf; ileostomy & R hemicolectomy   . PACEMAKER INSERTION  03/06/2013   SJM Assurity DR PPM implanted by Dr Rayann Heman for complete heart block  . PERMANENT PACEMAKER INSERTION N/A 03/06/2013   Procedure: PERMANENT PACEMAKER INSERTION;  Surgeon: Coralyn Mark, MD;  Location: [place] CATH LAB;  Service: Cardiovascular;  Laterality: N/A;  . TEMPORARY PACEMAKER INSERTION N/A 03/06/2013   Procedure: TEMPORARY PACEMAKER INSERTION;  Surgeon: Coralyn Mark, MD;  Location: [place] CATH LAB;  Service: Cardiovascular;  Laterality: N/A;  . TUBAL LIGATION  11/1982    Current Outpatient Medications  Medication Sig Dispense Refill  . acetaminophen (TYLENOL) 325 MG tablet Take 650 mg by mouth every 6 (six) hours as needed for moderate pain.     [name] allopurinol (ZYLOPRIM) 100 MG tablet Take 100 mg by mouth daily.     [name]  Calcium Carbonate (CALTRATE 600) 1500 MG TABS Take 1 tablet by mouth 2 (two) times daily.      . chlorhexidine (PERIDEX) 0.12 % solution USES ONCE EVERY FEW WEEKS IN WATER PIK AS DIRECTED  5  . cholecalciferol (VITAMIN D) 1000 UNITS tablet Take 2,000 Units by mouth daily.     . Fluticasone-Salmeterol (ADVAIR DISKUS) 250-50 MCG/DOSE AEPB USE 1 INHALATION TWO TIMES  DAILY 3 each 2  . furosemide (LASIX) 40 MG tablet Take 40 mg by mouth daily.    . hydroxychloroquine (PLAQUENIL) 200 MG tablet Take 200 mg by mouth  daily.    . isosorbide mononitrate (IMDUR) 30 MG 24 hr tablet TAKE 1 TABLET BY MOUTH  DAILY 90 tablet 3  . leflunomide (ARAVA) 10 MG tablet Take 10 mg by mouth daily.    [name] loratadine (CLARITIN) 10 MG tablet Take 10 mg by mouth daily.    . metoprolol succinate (TOPROL-XL) 50 MG 24 hr tablet TAKE 1 TABLET BY MOUTH  DAILY WITH OR IMMEDIATELY  FOLLOWING A MEAL 90 tablet 3  . predniSONE (DELTASONE) 5 MG tablet Take 5 mg by mouth daily.      . Probiotic Product (PROBIOTIC DAILY PO) Take 1 tablet by mouth daily.    . simvastatin (ZOCOR) 20 MG tablet Take 1 tablet by mouth at  bedtime 90 tablet 0  . warfarin (COUMADIN) 5 MG tablet TAKE 1 TO 1 AND 1/2 TABLETS BY MOUTH DAILY AS DIRECTED  BY COUMADIN CLINIC 135 tablet 1  . albuterol (PROVENTIL HFA;VENTOLIN HFA) 108 (90 Base) MCG/ACT inhaler Inhale 2 puffs into the lungs every 6 (six) hours as needed for wheezing or shortness of breath. (Patient not taking: Reported on 04/01/2018) 3 Inhaler 1   No current facility-administered medications for this encounter.     No Known Allergies  Social History   Socioeconomic History  . Marital status: Divorced    Spouse name: Not on file  . Number of children: 3  . Years of education: Not on file  . Highest education level: Not on file  Occupational History  . Occupation: Retired  Scientific laboratory technician  . Financial resource strain: Not on file  . Food insecurity:    Worry: Not on file    Inability: Not on file  . Transportation needs:    Medical: Not on file    Non-medical: Not on file  Tobacco Use  . Smoking status: Never Smoker  . Smokeless tobacco: Never Used  Substance and Sexual Activity  . Alcohol use: No  . Drug use: No  . Sexual activity: Not on file  Lifestyle  . Physical activity:    Days per week: Not on file    Minutes per session: Not on file  . Stress: Not on file  Relationships  . Social connections:    Talks on phone: Not on file    Gets together: Not on file    Attends religious  service: Not on file    Active member of club or organization: Not on file    Attends meetings of clubs or organizations: Not on file    Relationship status: Not on file  . Intimate partner violence:    Fear of current or ex partner: Not on file    Emotionally abused: Not on file    Physically abused: Not on file    Forced sexual activity: Not on file  Other Topics Concern  . Not on file  Social History Narrative   Lives alone in  [place]    Family History  Problem Relation Age of Onset  . Alzheimer's disease Father   . Cirrhosis Father   . Cancer Father        prostate  . Heart attack Neg Hx     ROS- All systems are reviewed and negative except as per the HPI above  Physical Exam: Vitals:   04/01/18 1013  BP: 118/78  Pulse: 83  Weight: 118.4 kg  Height: 5\' 5"  (1.651 m)   Wt Readings from Last 3 Encounters:  04/01/18 118.4 kg  03/25/18 118.4 kg  03/17/18 122.9 kg    Labs: Lab Results  Component Value Date   NA 140 03/25/2018   K 4.0 03/25/2018   CL 97 (L) 03/25/2018   CO2 31 03/25/2018   GLUCOSE 113 (H) 03/25/2018   BUN 39 (H) 03/25/2018   CREATININE 2.02 (H) 03/25/2018   CALCIUM 10.1 03/25/2018   MG 2.0 03/25/2018   Lab Results  Component Value Date   INR 4.59 (HH) 03/25/2018   Lab Results  Component Value Date   CHOL 157 06/02/2014   HDL 101 06/02/2014   LDLCALC 40 06/02/2014   TRIG 78 06/02/2014     GEN- The patient is well appearing, alert and oriented x 3 today.   Head- normocephalic, atraumatic Eyes-  Sclera clear, conjunctiva pink Ears- hearing intact Oropharynx- clear Neck- supple, no JVP Lymph- no cervical lymphadenopathy Lungs- Clear to ausculation bilaterally, normal work of breathing Heart- Regular rate and rhythm, no murmurs, rubs or gallops, PMI not laterally displaced GI- soft, NT, ND, + BS Extremities- no clubbing, cyanosis, or edema MS- no significant deformity or atrophy Skin- no rash or lesion Psych- euthymic mood,  full affect Neuro- strength and sensation are intact  EKG-v paced rhythm at 83 bpm, qtc 528 ms Echo-Study Conclusions  - Left ventricle: The cavity size was mildly dilated. Wall   thickness was increased in a pattern of mild LVH. Marked   septal-lateral dyssynchrony with septal hypokinesis. The   estimated ejection fraction was 45%. Doppler parameters are   consistent with abnormal left ventricular relaxation (grade 1   diastolic dysfunction). - Aortic valve: Mechanical aortic valve. Mean gradient not   significantly elevated across the aortic valve. There was trivial   regurgitation. Mean gradient (S): 16 mm Hg. - Mitral valve: Mildly calcified annulus. There was moderate   regurgitation. - Left atrium: The atrium was mildly dilated. - Right ventricle: The cavity size was moderately dilated. Pacer   wire or catheter noted in right ventricle. Systolic function was   normal. - Right atrium: The atrium was moderately dilated. - Tricuspid valve: There was severe regurgitation with incomplete   coaptation of the tricuspid leaflets. Peak RV-RA gradient (S): 27   mm Hg. - Pulmonary arteries: PA peak pressure: 42 mm Hg (S). - Systemic veins: IVC measured 2.8 cm with < 50% respirophasic   variation, suggesting RA pressure 15 mmHg.  Impressions:  - Mildly dilated LV with EF 45%. Septal-lateral dyssynchrony with   septal hypokinesis. Moderately dilated RV with normal systolic   function. Moderate mitral regurgitation. Severe tricuspid   regurgitation with incomplete coaptation of tricuspid leaflets.   Mechanical aortic valve appeared to function normally. Mild   pulmonary hypertension.    Assessment and Plan: 1. Persistent  afib  Present for the last 30+ days associated with edema, shortness of breath Discussed previously Tikosyn admit with the pt to try to restore SR She is in agreement and is here  for Tikosyn admit She has not had any benadryl   She is on warfarin, I  discussed with Dr. Rayann Heman and because all of her INR's have been either therapeutic or supra therpeutic for the last 6 months, he feels that the weekly 4 INR's can be waived and TEE is not needed Drugs screened by PharmD and she is on Plaquenil which is QT prolonging,  but PharmD did not say that it was contraindicated  Bmet/cbc/INR today Qtc reviewed with Dr. Rayann Heman, corrected at 495 ms  Pending admission when bed is available later today  Butch Penny C. Laryn Venning, [facility] [address] [place], [place] [address] [phone]

## 2018-04-02 DIAGNOSIS — I4819 Other persistent atrial fibrillation: Principal | ICD-10-CM

## 2018-04-02 LAB — BASIC METABOLIC PANEL
Anion gap: 8 (ref 5–15)
BUN: 43 mg/dL — AB (ref 8–23)
CO2: 30 mmol/L (ref 22–32)
Calcium: 9 mg/dL (ref 8.9–10.3)
Chloride: 102 mmol/L (ref 98–111)
Creatinine, Ser: 2.31 mg/dL — ABNORMAL HIGH (ref 0.44–1.00)
GFR calc Af Amer: 25 mL/min — ABNORMAL LOW (ref >60)
GFR calc non Af Amer: 21 mL/min — ABNORMAL LOW (ref >60)
Glucose, Bld: 105 mg/dL — ABNORMAL HIGH (ref 70–99)
POTASSIUM: 3.9 mmol/L (ref 3.5–5.1)
Sodium: 140 mmol/L (ref 135–145)

## 2018-04-02 LAB — PROTIME-INR
INR: 3.55
Prothrombin Time: 35 seconds — ABNORMAL HIGH (ref 11.4–15.2)

## 2018-04-02 LAB — HIV ANTIBODY (ROUTINE TESTING W REFLEX): HIV Screen 4th Generation wRfx: NONREACTIVE

## 2018-04-02 LAB — MAGNESIUM: Magnesium: 2.4 mg/dL (ref 1.7–2.4)

## 2018-04-02 MED ORDER — LORATADINE 10 MG PO TABS
10.0000 mg | ORAL_TABLET | Freq: Every day | ORAL | Status: DC
  Administered 2018-04-02 – 2018-04-04 (×3): 10 mg via ORAL
  Filled 2018-04-02 (×3): qty 1

## 2018-04-02 MED ORDER — POTASSIUM CHLORIDE CRYS ER 20 MEQ PO TBCR
40.0000 meq | EXTENDED_RELEASE_TABLET | Freq: Once | ORAL | Status: AC
  Administered 2018-04-02: 40 meq via ORAL
  Filled 2018-04-02: qty 2

## 2018-04-02 MED ORDER — ALLOPURINOL 100 MG PO TABS
100.0000 mg | ORAL_TABLET | Freq: Every day | ORAL | Status: DC
  Administered 2018-04-02 – 2018-04-04 (×3): 100 mg via ORAL
  Filled 2018-04-02 (×3): qty 1

## 2018-04-02 MED ORDER — DOFETILIDE 250 MCG PO CAPS
250.0000 ug | ORAL_CAPSULE | Freq: Two times a day (BID) | ORAL | Status: DC
  Administered 2018-04-02 – 2018-04-04 (×4): 250 ug via ORAL
  Filled 2018-04-02 (×4): qty 1

## 2018-04-02 MED ORDER — ACETAMINOPHEN 325 MG PO TABS
650.0000 mg | ORAL_TABLET | Freq: Four times a day (QID) | ORAL | Status: DC | PRN
  Administered 2018-04-03: 650 mg via ORAL
  Filled 2018-04-02: qty 2

## 2018-04-02 MED ORDER — VITAMIN D 25 MCG (1000 UNIT) PO TABS
2000.0000 [IU] | ORAL_TABLET | Freq: Every day | ORAL | Status: DC
  Administered 2018-04-02 – 2018-04-03 (×2): 2000 [IU] via ORAL
  Filled 2018-04-02 (×2): qty 2

## 2018-04-02 MED ORDER — ISOSORBIDE MONONITRATE ER 30 MG PO TB24
30.0000 mg | ORAL_TABLET | Freq: Every day | ORAL | Status: DC
  Administered 2018-04-02 – 2018-04-04 (×3): 30 mg via ORAL
  Filled 2018-04-02 (×3): qty 1

## 2018-04-02 MED ORDER — WARFARIN - PHARMACIST DOSING INPATIENT: Freq: Every day | Status: DC

## 2018-04-02 MED ORDER — SIMVASTATIN 20 MG PO TABS
20.0000 mg | ORAL_TABLET | Freq: Every day | ORAL | Status: DC
  Administered 2018-04-02 – 2018-04-03 (×2): 20 mg via ORAL
  Filled 2018-04-02 (×2): qty 1

## 2018-04-02 MED ORDER — LEFLUNOMIDE 20 MG PO TABS
10.0000 mg | ORAL_TABLET | Freq: Every day | ORAL | Status: DC
  Administered 2018-04-02 – 2018-04-04 (×3): 10 mg via ORAL
  Filled 2018-04-02 (×3): qty 0.5

## 2018-04-02 MED ORDER — WARFARIN SODIUM 5 MG PO TABS
5.0000 mg | ORAL_TABLET | Freq: Once | ORAL | Status: AC
  Administered 2018-04-02: 5 mg via ORAL
  Filled 2018-04-02: qty 1

## 2018-04-02 MED ORDER — CALCIUM CARBONATE 1250 (500 CA) MG PO TABS
1.0000 | ORAL_TABLET | Freq: Two times a day (BID) | ORAL | Status: DC
  Administered 2018-04-02 – 2018-04-04 (×4): 500 mg via ORAL
  Filled 2018-04-02 (×5): qty 1

## 2018-04-02 MED ORDER — SODIUM CHLORIDE 0.9 % IV SOLN: 250.0000 mL | INTRAVENOUS | Status: DC | PRN

## 2018-04-02 NOTE — Progress Notes (Signed)
Progress Note  Patient Name: [name] Date of Encounter: 04/02/2018  Primary Cardiologist: Quay Burow, MD   Subjective   Feels OK, no complaints  Inpatient Medications    Scheduled Meds: . allopurinol  100 mg Oral Daily  . calcium carbonate  1 tablet Oral BID WC  . cholecalciferol  2,000 Units Oral Daily  . dofetilide  250 mcg Oral BID  . fluticasone furoate-vilanterol  1 puff Inhalation Daily  . hydroxychloroquine  200 mg Oral Daily  . isosorbide mononitrate  30 mg Oral Daily  . leflunomide  10 mg Oral Daily  . loratadine  10 mg Oral Daily  . metoprolol succinate  50 mg Oral Daily  . predniSONE  5 mg Oral Q breakfast  . simvastatin  20 mg Oral QHS  . sodium chloride flush  3 mL Intravenous Q12H   Continuous Infusions: . sodium chloride     PRN Meds: sodium chloride, acetaminophen, sodium chloride flush   Vital Signs    Vitals:   04/01/18 1525 04/01/18 2024 04/02/18 0436  BP: 120/70 125/85 125/69  Pulse: 70 69 70  Resp: 16    Temp: (!) 97.5 F (36.4 C) 97.8 F (36.6 C) (!) 97.2 F (36.2 C)  TempSrc: Oral Oral Oral  SpO2: 100% 98% 97%  Weight: 118.3 kg  118.2 kg  Height: 5\' 5"  (1.651 m)      Intake/Output Summary (Last 24 hours) at 04/02/2018 0838 Last data filed at 04/01/2018 1800 Gross per 24 hour  Intake 222 ml  Output -  Net 222 ml   Last 3 Weights 04/02/2018 04/01/2018 04/01/2018  Weight (lbs) 260 lb 9.6 oz 260 lb 14.4 oz 261 lb  Weight (kg) 118.207 kg 118.343 kg 118.389 kg      Telemetry    V paced - Personally Reviewed  ECG    AFlutter, Vpaced - Personally Reviewed  Physical Exam   GEN: No acute distress.   Neck: No JVD Cardiac: RRR (paced), no murmurs, rubs, or gallops.  Respiratory: CTA b/l. GI: Soft, nontender, non-distended  MS: No edema; No deformity. Neuro:  Nonfocal  Psych: Normal affect   Labs    Chemistry Recent Labs  Lab 04/01/18 1005 04/02/18 0341  NA 141 140  K 4.0 3.9  CL 103 102  CO2 28 30  GLUCOSE  94 105*  BUN 41* 43*  CREATININE 2.16* 2.31*  CALCIUM 9.3 9.0  GFRNONAA 23* 21*  GFRAA 27* 25*  ANIONGAP 10 8     HematologyNo results for input(s): WBC, RBC, HGB, HCT, MCV, MCH, MCHC, RDW, PLT in the last 168 hours.  Cardiac EnzymesNo results for input(s): TROPONINI in the last 168 hours. No results for input(s): TROPIPOC in the last 168 hours.   BNPNo results for input(s): BNP, PROBNP in the last 168 hours.   DDimer No results for input(s): DDIMER in the last 168 hours.   Radiology    No results found.  Cardiac Studies    06/03/17: TTE Study Conclusions - Left ventricle: The cavity size was mildly dilated. Wall   thickness was increased in a pattern of mild LVH. Marked   septal-lateral dyssynchrony with septal hypokinesis. The   estimated ejection fraction was 45%. Doppler parameters are   consistent with abnormal left ventricular relaxation (grade 1   diastolic dysfunction). - Aortic valve: Mechanical aortic valve. Mean gradient not   significantly elevated across the aortic valve. There was trivial   regurgitation. Mean gradient (S): 16 mm Hg. -  Mitral valve: Mildly calcified annulus. There was moderate   regurgitation. - Left atrium: The atrium was mildly dilated. - Right ventricle: The cavity size was moderately dilated. Pacer   wire or catheter noted in right ventricle. Systolic function was   normal. - Right atrium: The atrium was moderately dilated. - Tricuspid valve: There was severe regurgitation with incomplete   coaptation of the tricuspid leaflets. Peak RV-RA gradient (S): 27   mm Hg. - Pulmonary arteries: PA peak pressure: 42 mm Hg (S). - Systemic veins: IVC measured 2.8 cm with < 50% respirophasic   variation, suggesting RA pressure 15 mmHg. Impressions: - Mildly dilated LV with EF 45%. Septal-lateral dyssynchrony with   septal hypokinesis. Moderately dilated RV with normal systolic   function. Moderate mitral regurgitation. Severe tricuspid    regurgitation with incomplete coaptation of tricuspid leaflets.   Mechanical aortic valve appeared to function normally. Mild   pulmonary hypertension.   Patient Profile     68 y.o. female w/PMHx of CHB w/PPM, VHD w/Mechanical AVR, HTN, Lupus, asthma, PVD w/hx of carotid surgery remotely, CKD (III) single functioning kidney, HLD and AFib, admitted for Tikosyn initiation  Assessment & Plan    1. Persistent Afib     CHA2DS2Vasc is 4, on warfarin     K+ 3.9, replaced via RPH already     Mag 2.4     Creat 2.31 (trending up, calc CrCl today is 45) on 247mcg dose     QT is OK, given long QRS, EKG reviewed with Dr. Rayann Heman  Plan DCCV tomorrow if not in SR, patient is agreeable  2. Mechanic AVR     On warfarin, pharmacy managing     INR today is 3.55, trending down,      I d/w pharmacy, would like her to get a dose of warfarin today to start to level off, and avoid getting subtherapeutic  3. CKD     Single functioning kidney     Creat trending upwards, still ok for current tikosyn dose     Will hold her lasix today    For questions or updates, please contact [place] Please consult [email or web address] for contact info under        Signed, Baldwin [place], PA-C  04/02/2018, 8:38 AM

## 2018-04-02 NOTE — Progress Notes (Signed)
Pharmacy Consult for [place] Electrolyte Replacement  Pharmacy consulted to assist in monitoring and replacing electrolytes in this 68 y.o. female admitted on 04/01/2018 undergoing dofetilide initiation. First dofetilide dose: 2/11 @ 8 PM  Labs:    Component Value Date/Time   K 3.9 04/02/2018 0341   MG 2.4 04/02/2018 0341     Plan: Potassium: K 3.8-3.9:  Give KCl 40 mEq po x1 then give Tikosyn at least 2hr after KCl dose - do not need to recheck K   Magnesium: Mg >2: No additional supplementation needed   Thank you for allowing pharmacy to participate in this patient's care   Claiborne Billings, PharmD PGY2 Cardiology Pharmacy Resident Please check AMION for all Pharmacist numbers by unit 04/02/2018 6:58 AM

## 2018-04-02 NOTE — Care Management (Signed)
#    5. S/W   [facility]  @  [place] # [phone]   1. TIKOSYN  250 MCG CAPSULE  BID COVER- YES CO-PAY- $ 100.00 TIER- NONE FORMULARY PRIOR APPROVAL- NO  2. DOFETILIDE  250 MCG CAPSULE BID COVER- YES CO-PAY-  $ 99.50 TIER- 4 DRUG PRIOR APPROVAL- NO  NO DEDUCTIBLE  PREFERRED PHARMACY : YES [facility]  AND [facility]

## 2018-04-02 NOTE — H&P (Signed)
Primary Care Physician: [name], MD Referring Physician:Dr. Jerl Santos Couse is a 68 y.o. female with a h/o PPM,CAD, mechanical  AVR on warfarin, persistent afib for the last 30 days per [facility] device alert brought to the attention of [facility], NP/ Dr. Rayann Heman. He feels that is is worth a try to try pt on tikosyn as she is having more issues with edema and shortness of breath in the last several weeks.  He is concerned with her obesity and severe TR that the results may be marginal. The pt is informed of dofetilide admission and what is to be expected with hospitalization for same. She is interested in pursing. EKG shows v paced today at 76 bpm.   F/u in afib clinic, 2/11, she is here for dofetilide admit. She plans to go thru [facility] for her med.  Today, she denies symptoms of palpitations, chest pain, shortness of breath, orthopnea, PND, lower extremity edema, dizziness, presyncope, syncope, or neurologic sequela. The patient is tolerating medications without difficulties and is otherwise without complaint today.   Past Medical History:  Diagnosis Date  . Antiphospholipid antibody positive 06/28/2017  . Asthma   . CAD (coronary artery disease)    a. normal cors by cath in 03/2015  . Carotid artery disease ([place])   . Chronic anticoagulation   . Complete heart block ([place]) 02/2013   a. Implantation of a dual chamber pacemaker in 02/2013 by Dr Rayann Heman. STJ model number 8938 pacemaker with model number 2088 right ventricular lead.   [name] Dyslipidemia   . H/O Doppler ultrasound 10/03/2010   carotid duplex - R ICA normal patency; L CCA-ICA bypass gradt patent common to interal cartoid bypass graft   . H/O mechanical aortic valve replacement   . History of echocardiogram 11/08/2011   EF >55%; mild-mod concentric LVH; trace aortic regurg.; LA mild-mod dilated  . History of nuclear stress test 01/20/2010   normal pattern of perfusion; low risk scan  . Hypertension   . Lupus (systemic  lupus erythematosus) ([place])   . PVD (peripheral vascular disease) ([facility])    60% R renal artery stenosis; non-functioning L kidney   Past Surgical History:  Procedure Laterality Date  . AORTIC VALVE REPLACEMENT  11/29/006   [place]; [place]; on coumadin  . CARDIAC CATHETERIZATION  11/01/2004   define anatomy   . CARDIAC CATHETERIZATION N/A 04/19/2015   Procedure: Coronary Angiography;  Surgeon: Peter M [name], MD;  Location: [place] CV LAB;  Service: Cardiovascular;  Laterality: N/A;  . CAROTID ENDARTERECTOMY     lt.  . CHOLECYSTECTOMY  1999  . EXPLORATORY LAPAROTOMY  04/12/2012   small bowel perf; ileostomy & R hemicolectomy   . PACEMAKER INSERTION  03/06/2013   SJM Assurity DR PPM implanted by Dr Rayann Heman for complete heart block  . PERMANENT PACEMAKER INSERTION N/A 03/06/2013   Procedure: PERMANENT PACEMAKER INSERTION;  Surgeon: Coralyn Mark, MD;  Location: [place] CATH LAB;  Service: Cardiovascular;  Laterality: N/A;  . TEMPORARY PACEMAKER INSERTION N/A 03/06/2013   Procedure: TEMPORARY PACEMAKER INSERTION;  Surgeon: Coralyn Mark, MD;  Location: [place] CATH LAB;  Service: Cardiovascular;  Laterality: N/A;  . TUBAL LIGATION  11/1982    Current Facility-Administered Medications  Medication Dose Route Frequency Provider Last Rate Last Dose  . 0.9 %  sodium chloride infusion  250 mL Intravenous PRN Kenley Rettinger, Jeneen Rinks, MD      . acetaminophen (TYLENOL) tablet 650 mg  650 mg Oral Q6H PRN Devaney Segers,  Jeneen Rinks, MD      . allopurinol (ZYLOPRIM) tablet 100 mg  100 mg Oral Daily Saahir Prude, Jeneen Rinks, MD   100 mg at 04/02/18 1005  . calcium carbonate (OS-CAL - dosed in mg of elemental calcium) tablet 500 mg of elemental calcium  1 tablet Oral BID WC Imanii Gosdin, MD   500 mg of elemental calcium at 04/02/18 1813  . cholecalciferol (VITAMIN D3) tablet 2,000 Units  2,000 Units Oral Daily Thompson Grayer, MD   2,000 Units at 04/02/18 1005  . dofetilide (TIKOSYN) capsule 250 mcg  250 mcg Oral BID Mihailo Sage, MD      .  fluticasone furoate-vilanterol (BREO ELLIPTA) 200-25 MCG/INH 1 puff  1 puff Inhalation Daily Sherran Needs, NP   1 puff at 04/02/18 0911  . hydroxychloroquine (PLAQUENIL) tablet 200 mg  200 mg Oral Daily Sherran Needs, NP   200 mg at 04/02/18 1203  . isosorbide mononitrate (IMDUR) 24 hr tablet 30 mg  30 mg Oral Daily Kynslei Art, Jeneen Rinks, MD   30 mg at 04/02/18 1005  . leflunomide (ARAVA) tablet 10 mg  10 mg Oral Daily Jatavian Calica, Jeneen Rinks, MD   10 mg at 04/02/18 1005  . loratadine (CLARITIN) tablet 10 mg  10 mg Oral Daily Audi Wettstein, Jeneen Rinks, MD   10 mg at 04/02/18 1006  . metoprolol succinate (TOPROL-XL) 24 hr tablet 50 mg  50 mg Oral Daily Sherran Needs, NP   50 mg at 04/02/18 1202  . predniSONE (DELTASONE) tablet 5 mg  5 mg Oral Q breakfast Sherran Needs, NP   5 mg at 04/02/18 1202  . simvastatin (ZOCOR) tablet 20 mg  20 mg Oral QHS Thompson Grayer, MD      . Warfarin - Pharmacist Dosing Inpatient   Does not apply [id number] Thompson Grayer, MD        No Known Allergies  Social History   Socioeconomic History  . Marital status: Divorced    Spouse name: Not on file  . Number of children: 3  . Years of education: Not on file  . Highest education level: Not on file  Occupational History  . Occupation: Retired  Scientific laboratory technician  . Financial resource strain: Not on file  . Food insecurity:    Worry: Not on file    Inability: Not on file  . Transportation needs:    Medical: Not on file    Non-medical: Not on file  Tobacco Use  . Smoking status: Never Smoker  . Smokeless tobacco: Never Used  Substance and Sexual Activity  . Alcohol use: No  . Drug use: No  . Sexual activity: Not on file  Lifestyle  . Physical activity:    Days per week: Not on file    Minutes per session: Not on file  . Stress: Not on file  Relationships  . Social connections:    Talks on phone: Not on file    Gets together: Not on file    Attends religious service: Not on file    Active member of club or organization: Not on  file    Attends meetings of clubs or organizations: Not on file    Relationship status: Not on file  . Intimate partner violence:    Fear of current or ex partner: Not on file    Emotionally abused: Not on file    Physically abused: Not on file    Forced sexual activity: Not on file  Other Topics Concern  . Not on file  Social History Narrative   Lives alone in [place].    Family History  Problem Relation Age of Onset  . Alzheimer's disease Father   . Cirrhosis Father   . Cancer Father        prostate  . Heart attack Neg Hx     ROS- All systems are reviewed and negative except as per the HPI above  Physical Exam: Vitals:   04/01/18 1525 04/01/18 2024 04/02/18 0436 04/02/18 0912  BP: 120/70 125/85 125/69   Pulse: 70 69 70   Resp: 16     Temp: (!) 97.5 F (36.4 C) 97.8 F (36.6 C) (!) 97.2 F (36.2 C)   TempSrc: Oral Oral Oral   SpO2: 100% 98% 97% 97%  Weight: 118.3 kg  118.2 kg   Height: 5\' 5"  (1.651 m)      Wt Readings from Last 3 Encounters:  04/02/18 118.2 kg  04/01/18 118.4 kg  03/25/18 118.4 kg    Labs: Lab Results  Component Value Date   NA 140 04/02/2018   K 3.9 04/02/2018   CL 102 04/02/2018   CO2 30 04/02/2018   GLUCOSE 105 (H) 04/02/2018   BUN 43 (H) 04/02/2018   CREATININE 2.31 (H) 04/02/2018   CALCIUM 9.0 04/02/2018   MG 2.4 04/02/2018   Lab Results  Component Value Date   INR 3.55 04/02/2018   Lab Results  Component Value Date   CHOL 157 06/02/2014   HDL 101 06/02/2014   LDLCALC 40 06/02/2014   TRIG 78 06/02/2014     GEN- The patient is well appearing, alert and oriented x 3 today.   Head- normocephalic, atraumatic Eyes-  Sclera clear, conjunctiva pink Ears- hearing intact Oropharynx- clear Neck- supple, no JVP Lymph- no cervical lymphadenopathy Lungs- Clear to ausculation bilaterally, normal work of breathing Heart- Regular rate and rhythm, no murmurs, rubs or gallops, PMI not laterally displaced GI- soft, NT, ND, +  BS Extremities- no clubbing, cyanosis, or edema MS- no significant deformity or atrophy Skin- no rash or lesion Psych- euthymic mood, full affect Neuro- strength and sensation are intact  EKG-v paced rhythm at 83 bpm, qtc 528 ms Echo-Study Conclusions  - Left ventricle: The cavity size was mildly dilated. Wall   thickness was increased in a pattern of mild LVH. Marked   septal-lateral dyssynchrony with septal hypokinesis. The   estimated ejection fraction was 45%. Doppler parameters are   consistent with abnormal left ventricular relaxation (grade 1   diastolic dysfunction). - Aortic valve: Mechanical aortic valve. Mean gradient not   significantly elevated across the aortic valve. There was trivial   regurgitation. Mean gradient (S): 16 mm Hg. - Mitral valve: Mildly calcified annulus. There was moderate   regurgitation. - Left atrium: The atrium was mildly dilated. - Right ventricle: The cavity size was moderately dilated. Pacer   wire or catheter noted in right ventricle. Systolic function was   normal. - Right atrium: The atrium was moderately dilated. - Tricuspid valve: There was severe regurgitation with incomplete   coaptation of the tricuspid leaflets. Peak RV-RA gradient (S): 27   mm Hg. - Pulmonary arteries: PA peak pressure: 42 mm Hg (S). - Systemic veins: IVC measured 2.8 cm with < 50% respirophasic   variation, suggesting RA pressure 15 mmHg.  Impressions:  - Mildly dilated LV with EF 45%. Septal-lateral dyssynchrony with   septal hypokinesis. Moderately dilated RV with normal systolic   function. Moderate mitral regurgitation. Severe tricuspid   regurgitation with incomplete  coaptation of tricuspid leaflets.   Mechanical aortic valve appeared to function normally. Mild   pulmonary hypertension.    Assessment and Plan: 1. Persistent  afib  Present for the last 30+ days associated with edema, shortness of breath Discussed previously Tikosyn admit with the  pt to try to restore SR She is in agreement and is here for Tikosyn admit She has not had any benadryl   She is on warfarin, I discussed with Dr. Rayann Heman and because all of her INR's have been either therapeutic or supra therpeutic for the last 6 months, he feels that the weekly 4 INR's can be waived and TEE is not needed Drugs screened by PharmD and she is on Plaquenil which is QT prolonging,  but PharmD did not say that it was contraindicated  Bmet/cbc/INR today Qtc reviewed with Dr. Rayann Heman, corrected at 495 ms  Pending admission when bed is available later today  Butch Penny C. Carroll, [facility] [address] [place], [place] [address] [phone]  I have seen, examined the patient, and reviewed the above assessment and plan.  Changes to above are made where necessary.  On exam, iRRR.  She is admitted for initiation of tikosyn.  She reports compliance with anticoagulation.  Will follow qt closely while here.  May require cardioversion if still in AF tomorrow.  Co Sign: Thompson Grayer, MD 04/02/2018

## 2018-04-02 NOTE — Progress Notes (Signed)
[facility] for Warfarin Indication: atrial fibrillation  No Known Allergies  Patient Measurements: Height: 5\' 5"  (165.1 cm) Weight: 260 lb 9.6 oz (118.2 kg) IBW/kg (Calculated) : 57  Vital Signs: Temp: 97.2 F (36.2 C) (02/12 0436) Temp Source: Oral (02/12 0436) BP: 125/69 (02/12 0436) Pulse Rate: 70 (02/12 0436)  Labs: Recent Labs    04/01/18 1005 04/02/18 0341  LABPROT 42.6* 35.0*  INR 4.58* 3.55  CREATININE 2.16* 2.31*    Estimated Creatinine Clearance: 31.2 mL/min (A) (by C-G formula based on SCr of 2.31 mg/dL (H)).  Assessment: 68 year old female on warfarin prior to admission for Afib, admitted today for Tikosyn initiation Admission INR = 4.58  INR today down to 3.55 after holding x 1 day  Dose prior to admission -> alternating 7.5 / 5 mg dose  Goal of Therapy:  INR = 2.5 to 3.5 Monitor platelets by anticoagulation protocol: Yes   Plan:  Warfarin 5 mg po x 1 today Daily INR  Thank you Anette Guarneri, PharmD  04/02/2018,8:49 AM

## 2018-04-02 NOTE — Progress Notes (Signed)
Post dose EKG is reviewed.   Remains in AFlutter, paced QRS Given QRS duration of 238ms, QT is ok to continue with Tikosyn load.  No change in dose  Tommye Standard, PA-C

## 2018-04-03 ENCOUNTER — Encounter (HOSPITAL_COMMUNITY)
Admission: RE | Disposition: A | Discharge status: Home / Self Care | Payer: Self-pay | Source: Ambulatory Visit | Attending: Internal Medicine

## 2018-04-03 DIAGNOSIS — N189 Chronic kidney disease, unspecified: Secondary | ICD-10-CM

## 2018-04-03 DIAGNOSIS — N289 Disorder of kidney and ureter, unspecified: Secondary | ICD-10-CM

## 2018-04-03 LAB — BASIC METABOLIC PANEL
Anion gap: 12 (ref 5–15)
BUN: 38 mg/dL — ABNORMAL HIGH (ref 8–23)
CO2: 25 mmol/L (ref 22–32)
Calcium: 9.2 mg/dL (ref 8.9–10.3)
Chloride: 101 mmol/L (ref 98–111)
Creatinine, Ser: 1.99 mg/dL — ABNORMAL HIGH (ref 0.44–1.00)
GFR calc Af Amer: 30 mL/min — ABNORMAL LOW (ref >60)
GFR calc non Af Amer: 26 mL/min — ABNORMAL LOW (ref >60)
Glucose, Bld: 121 mg/dL — ABNORMAL HIGH (ref 70–99)
Potassium: 4.3 mmol/L (ref 3.5–5.1)
Sodium: 138 mmol/L (ref 135–145)

## 2018-04-03 LAB — PROTIME-INR
INR: 3.21
Prothrombin Time: 32.4 seconds — ABNORMAL HIGH (ref 11.4–15.2)

## 2018-04-03 LAB — MAGNESIUM: Magnesium: 2.3 mg/dL (ref 1.7–2.4)

## 2018-04-03 SURGERY — CARDIOVERSION
Anesthesia: General

## 2018-04-03 MED ORDER — METOPROLOL SUCCINATE ER 50 MG PO TB24
50.0000 mg | ORAL_TABLET | Freq: Every day | ORAL | Status: DC
  Administered 2018-04-03 – 2018-04-04 (×2): 50 mg via ORAL
  Filled 2018-04-03: qty 1

## 2018-04-03 MED ORDER — SODIUM CHLORIDE 0.9% FLUSH
3.0000 mL | Freq: Two times a day (BID) | INTRAVENOUS | Status: DC
  Administered 2018-04-03: 3 mL via INTRAVENOUS

## 2018-04-03 MED ORDER — WARFARIN SODIUM 7.5 MG PO TABS
7.5000 mg | ORAL_TABLET | Freq: Once | ORAL | Status: AC
  Administered 2018-04-03: 7.5 mg via ORAL
  Filled 2018-04-03: qty 1

## 2018-04-03 MED ORDER — SODIUM CHLORIDE 0.9 % IV SOLN: 250.0000 mL | INTRAVENOUS | Status: DC

## 2018-04-03 MED ORDER — HYDROXYCHLOROQUINE SULFATE 200 MG PO TABS
200.0000 mg | ORAL_TABLET | Freq: Every day | ORAL | Status: DC
  Administered 2018-04-03 – 2018-04-04 (×2): 200 mg via ORAL
  Filled 2018-04-03: qty 1

## 2018-04-03 MED ORDER — PREDNISONE 5 MG PO TABS
5.0000 mg | ORAL_TABLET | Freq: Every day | ORAL | Status: DC
  Administered 2018-04-03 – 2018-04-04 (×2): 5 mg via ORAL
  Filled 2018-04-03: qty 1

## 2018-04-03 MED ORDER — SODIUM CHLORIDE 0.9% FLUSH: 3.0000 mL | INTRAVENOUS | Status: DC | PRN

## 2018-04-03 MED ORDER — HYDROCORTISONE 1 % EX CREA
1.0000 "application " | TOPICAL_CREAM | Freq: Three times a day (TID) | CUTANEOUS | Status: DC | PRN
  Filled 2018-04-03: qty 28

## 2018-04-03 NOTE — Progress Notes (Signed)
Post dose EKG is reviewed, given paced QRS duration, QTc is OK to continue Tikosyn load.  Tommye Standard, PA-C

## 2018-04-03 NOTE — Progress Notes (Signed)
Pharmacy Consult for [place] Electrolyte Replacement  Pharmacy consulted to assist in monitoring and replacing electrolytes in this 68 y.o. female admitted on 04/01/2018 undergoing dofetilide initiation. First dofetilide dose: 2/11 @ 8 PM  Labs:    Component Value Date/Time   K 4.3 04/03/2018 0356   MG 2.3 04/03/2018 0356     Plan: Potassium: K >/= 4: No additional supplementation needed  Magnesium: Mg >2: No additional supplementation needed   Thank you for allowing pharmacy to participate in this patient's care   Claiborne Billings, PharmD PGY2 Cardiology Pharmacy Resident Please check AMION for all Pharmacist numbers by unit 04/03/2018 7:12 AM

## 2018-04-03 NOTE — Progress Notes (Signed)
[facility] for Warfarin Indication: atrial fibrillation  No Known Allergies  Patient Measurements: Height: 5\' 5"  (165.1 cm) Weight: 262 lb 12.8 oz (119.2 kg) IBW/kg (Calculated) : 57  Vital Signs: Temp: 97.2 F (36.2 C) (02/13 0443) Temp Source: Oral (02/13 0443) BP: 125/70 (02/13 0443) Pulse Rate: 64 (02/13 0443)  Labs: Recent Labs    04/01/18 1005 04/02/18 0341 04/03/18 0356  LABPROT 42.6* 35.0* 32.4*  INR 4.58* 3.55 3.21  CREATININE 2.16* 2.31* 1.99*    Estimated Creatinine Clearance: 36.4 mL/min (A) (by C-G formula based on SCr of 1.99 mg/dL (H)).  Assessment: 68 year old female on warfarin prior to admission for Afib, admitted today for Tikosyn initiation Admission INR = 4.58  INR = 3.21 today  Dose prior to admission -> alternating 7.5 / 5 mg dose  Goal of Therapy:  INR = 2.5 to 3.5 Monitor platelets by anticoagulation protocol: Yes   Plan:  Warfarin 7.5 mg po x 1 tonight (home regimen) Daily INR  Thank you Anette Guarneri, PharmD  04/03/2018,9:37 AM

## 2018-04-03 NOTE — Progress Notes (Signed)
Progress Note   Subjective   Doing well today, the patient denies CP or SOB.  No new concerns  Inpatient Medications    Scheduled Meds: . allopurinol  100 mg Oral Daily  . calcium carbonate  1 tablet Oral BID WC  . cholecalciferol  2,000 Units Oral Daily  . dofetilide  250 mcg Oral BID  . fluticasone furoate-vilanterol  1 puff Inhalation Daily  . hydroxychloroquine  200 mg Oral Daily  . isosorbide mononitrate  30 mg Oral Daily  . leflunomide  10 mg Oral Daily  . loratadine  10 mg Oral Daily  . metoprolol succinate  50 mg Oral Daily  . predniSONE  5 mg Oral Q breakfast  . simvastatin  20 mg Oral QHS  . sodium chloride flush  3 mL Intravenous Q12H  . Warfarin - Pharmacist Dosing Inpatient   Does not apply q1800   Continuous Infusions: . sodium chloride    . sodium chloride     PRN Meds: sodium chloride, acetaminophen, hydrocortisone cream, sodium chloride flush   Vital Signs    Vitals:   04/02/18 0436 04/02/18 0912 04/02/18 2019 04/03/18 0443  BP: 125/69  133/70 125/70  Pulse: 70  68 64  Resp:      Temp: (!) 97.2 F (36.2 C)  (!) 97.5 F (36.4 C) (!) 97.2 F (36.2 C)  TempSrc: Oral  Oral Oral  SpO2: 97% 97% 97% 97%  Weight: 118.2 kg   119.2 kg  Height:       No intake or output data in the 24 hours ending 04/03/18 0800 Filed Weights   04/01/18 1525 04/02/18 0436 04/03/18 0443  Weight: 118.3 kg 118.2 kg 119.2 kg    Telemetry    She has converted to sinus rhythm,  Rare PVCs and V pacing are also noted - Personally Reviewed  Physical Exam   GEN- The patient is well appearing, alert and oriented x 3 today.   Head- normocephalic, atraumatic Eyes-  Sclera clear, conjunctiva pink Ears- hearing intact Oropharynx- clear Neck- supple, Lungs- Clear to ausculation bilaterally, normal work of breathing Heart- Regular rate and rhythm , mechanical S2 GI- soft, NT, ND, + BS Extremities- no clubbing, cyanosis, or edema  MS- no significant deformity or  atrophy Skin- no rash or lesion Psych- euthymic mood, full affect Neuro- strength and sensation are intact   Labs    Chemistry Recent Labs  Lab 04/01/18 1005 04/02/18 0341 04/03/18 0356  NA 141 140 138  K 4.0 3.9 4.3  CL 103 102 101  CO2 28 30 25   GLUCOSE 94 105* 121*  BUN 41* 43* 38*  CREATININE 2.16* 2.31* 1.99*  CALCIUM 9.3 9.0 9.2  GFRNONAA 23* 21* 26*  GFRAA 27* 25* 30*  ANIONGAP 10 8 12      HematologyNo results for input(s): WBC, RBC, HGB, HCT, MCV, MCH, MCHC, RDW, PLT in the last 168 hours.  Cardiac EnzymesNo results for input(s): TROPONINI in the last 168 hours. No results for input(s): TROPIPOC in the last 168 hours.     ekg reveals sinus with V pacing, QT 509 msec  Assessment & Plan    1.  Persistent afib Now in sinus rhythm Will cancel cardioversion Qt is stable Continue tikosyn INR is managed by pharmacy team  2. Mechanical AVR On coumadin  3. Acute on chronic renal failure (stage IV) imiproved with holding lasix yesterday  Continue to follow closely Hopefully home tomorrow  Thompson Grayer MD, [facility] 04/03/2018 8:00 AM

## 2018-04-03 NOTE — Discharge Summary (Addendum)
ELECTROPHYSIOLOGY PROCEDURE DISCHARGE SUMMARY    Patient ID: [name],  MRN: [id number], DOB/AGE: 1950-10-11 68 y.o.  Admit date: 04/01/2018 Discharge date: 04/04/2018  Primary Care Physician: [name], MD  Primary Cardiologist: Dr. Gwenlyn Found Electrophysiologist: Dr. Rayann Heman  Primary Discharge Diagnosis:  1.  persistent atrial fibrillation status post Tikosyn loading this admission      CHA2DS2Vasc is 4, on Warfarin, monitored and managed by the coumadin clinic/Dr. Gwenlyn Found  Secondary Discharge Diagnosis:  1. VHD w/AVR (mechanical)     On warfarin 2. CKD     Single functioning kidney 3. HTN 4. Lupus 5. PVD     H/o carotid surgery (remotely)  No Known Allergies   Procedures This Admission:  1.  Tikosyn loading   Brief HPI: [name] is a 68 y.o. female with a past medical history as noted above.  She is followed by EP in the outpatient setting for treatment options of atrial fibrillation.  Risks, benefits, and alternatives to Tikosyn were reviewed with the patient who wished to proceed.    Hospital Course:  The patient was admitted and Tikosyn was initiated.  Renal function and electrolytes were followed during the hospitalization.   Her Creat noted higher then her baseline, and lasix held with return towards her baseline.  Will reduce her lasix dose to 20mg  (1/2 dose).  Her QTc remained stable.  She converted with drug and did no required DCCV.  The patient was monitored until discharge on telemetry which demonstrated SR/ V paced.  On the day of discharge, she feels, well, was examined by Dr Rayann Heman  who considered the patient stable for discharge to home.  Follow-up has been arranged with the AFib clinic in 1 week and with Dr Rayann Heman in 4 weeks.   Her INR coming in was supra-therapeutic, as was the previous.  She was taking 5mg  (4days) alternating days with 7.5mg  (3 days).  In discussion with the coumadin clinic staff who know her well and her labile INRs, since she is  in the trend down, she got a 7.5 mg yesterday, will 7.5mg  today again, then 5mg  sat, 7.5 sun, and see them in clinic Monday to adjust based on that reading   Physical Exam: Vitals:   04/03/18 2105 04/04/18 0433 04/04/18 0838 04/04/18 0939  BP: 124/67 137/81 119/66   Pulse: 62 64 65 74  Resp: 18   16  Temp: (!) 97.4 F (36.3 C) 97.6 F (36.4 C)    TempSrc: Oral Oral    SpO2: 99% 99%  96%  Weight:  119.9 kg    Height:        GEN- The patient is well appearing, alert and oriented x 3 today.   HEENT: normocephalic, atraumatic; sclera clear, conjunctiva pink; hearing intact; oropharynx clear; neck supple, no JVP Lymph- no cervical lymphadenopathy Lungs- CTA b/l, normal work of breathing.  No wheezes, rales, rhonchi Heart- RRR, no murmurs, rubs or gallops, PMI not laterally displaced GI- soft, non-tender, non-distended Extremities- no clubbing, cyanosis, or edema MS- no significant deformity or atrophy Skin- warm and dry, no rash or lesion Psych- euthymic mood, full affect Neuro- strength and sensation are intact   Labs:   Lab Results  Component Value Date   WBC 7.3 03/17/2018   HGB 11.8 03/17/2018   HCT 36.7 03/17/2018   MCV 88 03/17/2018   PLT 216 03/17/2018    Recent Labs  Lab 04/04/18 0351  NA 138  K 4.0  CL 105  CO2 24  BUN 42*  CREATININE 1.85*  CALCIUM 9.2  GLUCOSE 131*     Discharge Medications:  Allergies as of 04/04/2018   No Known Allergies     Medication List    TAKE these medications   acetaminophen 325 MG tablet Commonly known as:  TYLENOL Take 650 mg by mouth every 6 (six) hours as needed for moderate pain.   albuterol 108 (90 Base) MCG/ACT inhaler Commonly known as:  PROVENTIL HFA;VENTOLIN HFA Inhale 2 puffs into the lungs every 6 (six) hours as needed for wheezing or shortness of breath.   allopurinol 100 MG tablet Commonly known as:  ZYLOPRIM Take 100 mg by mouth daily.   CALTRATE 600 1500 (600 Ca) MG Tabs tablet Generic drug:   calcium carbonate Take 1 tablet by mouth 2 (two) times daily.   cetirizine 10 MG tablet Commonly known as:  ZYRTEC Take 10 mg by mouth at bedtime.   chlorhexidine 0.12 % solution Commonly known as:  PERIDEX Use as directed 10 mLs in the mouth or throat 2 (two) times daily as needed (before dental appts).   cholecalciferol 1000 units tablet Commonly known as:  VITAMIN D Take 2,000 Units by mouth 2 (two) times daily.   dofetilide 250 MCG capsule Commonly known as:  TIKOSYN Take 1 capsule (250 mcg total) by mouth 2 (two) times daily.   furosemide 20 MG tablet Commonly known as:  LASIX Take 1 tablet (20 mg total) by mouth daily. What changed:    medication strength  how much to take   hydroxychloroquine 200 MG tablet Commonly known as:  PLAQUENIL Take 200 mg by mouth daily.   isosorbide mononitrate 30 MG 24 hr tablet Commonly known as:  IMDUR TAKE 1 TABLET BY MOUTH  DAILY   leflunomide 10 MG tablet Commonly known as:  ARAVA Take 10 mg by mouth daily.   loratadine 10 MG tablet Commonly known as:  CLARITIN Take 10 mg by mouth daily.   metoprolol succinate 50 MG 24 hr tablet Commonly known as:  TOPROL-XL TAKE 1 TABLET BY MOUTH  DAILY WITH OR IMMEDIATELY  FOLLOWING A MEAL What changed:  See the new instructions.   predniSONE 5 MG tablet Commonly known as:  DELTASONE Take 5 mg by mouth daily.   PROBIOTIC DAILY PO Take 1 tablet by mouth daily.   simvastatin 20 MG tablet Commonly known as:  ZOCOR Take 1 tablet by mouth at  bedtime   warfarin 5 MG tablet Commonly known as:  COUMADIN Take as directed. If you are unsure how to take this medication, talk to your nurse or doctor. Original instructions:  TAKE 1 TO 1 AND 1/2 TABLETS BY MOUTH DAILY AS DIRECTED  BY COUMADIN CLINIC What changed:  See the new instructions. Notes to patient:  Friday (tonight) take 7.5mg , Saturday take 5mg , Sunday take 7.5 mg and see the coumadin clinic on Monday as we discussed   WIXELA  INHUB 250-50 MCG/DOSE Aepb Generic drug:  Fluticasone-Salmeterol Inhale 1 puff into the lungs daily. What changed:  Another medication with the same name was removed. Continue taking this medication, and follow the directions you see here. Notes to patient:  This drug was listed twice, please continue as you have been instructed by the original prescribing doctor       Disposition:  Home  Discharge Instructions    Diet - low sodium heart healthy   Complete by:  As directed    Increase activity slowly   Complete by:  As directed      Follow-up Information    [facility] Follow up.   Specialty:  Cardiology Why:  04/11/2018 @ 11:00AM Contact information: [address] [id number] [place] [id number] [phone]       Thompson Grayer, MD Follow up.   Specialty:  Cardiology Why:  05/12/2018 @ 11:45AM Contact information: [address] [place] [address] [phone]        [facility] Follow up.   Specialty:  Cardiology Why:  Monday, 04/07/2018, coumadin clinic 10:45AM Contact information: [place] [phone]          Duration of Discharge Encounter: Greater than 30 minutes including physician time.  Signed, Tommye Standard, PA-C 04/04/2018 12:53 PM   I have seen, examined the patient, and reviewed the above assessment and plan.  Changes to above are made where necessary.  On exam, RRR.  DC to home with close follow-up in the AF clinic  Co Sign: Thompson Grayer, MD 04/04/2018 6:46 PM

## 2018-04-04 LAB — PROTIME-INR
INR: 3.05
Prothrombin Time: 31.1 seconds — ABNORMAL HIGH (ref 11.4–15.2)

## 2018-04-04 LAB — BASIC METABOLIC PANEL
Anion gap: 9 (ref 5–15)
BUN: 42 mg/dL — ABNORMAL HIGH (ref 8–23)
CO2: 24 mmol/L (ref 22–32)
CREATININE: 1.85 mg/dL — AB (ref 0.44–1.00)
Calcium: 9.2 mg/dL (ref 8.9–10.3)
Chloride: 105 mmol/L (ref 98–111)
GFR calc Af Amer: 33 mL/min — ABNORMAL LOW (ref >60)
GFR calc non Af Amer: 28 mL/min — ABNORMAL LOW (ref >60)
Glucose, Bld: 131 mg/dL — ABNORMAL HIGH (ref 70–99)
Potassium: 4 mmol/L (ref 3.5–5.1)
SODIUM: 138 mmol/L (ref 135–145)

## 2018-04-04 LAB — MAGNESIUM: Magnesium: 2.2 mg/dL (ref 1.7–2.4)

## 2018-04-04 MED ORDER — DOFETILIDE 250 MCG PO CAPS: 250.0000 ug | ORAL_CAPSULE | Freq: Two times a day (BID) | ORAL | 0 refills | Status: DC

## 2018-04-04 MED ORDER — FUROSEMIDE 20 MG PO TABS: 20.0000 mg | ORAL_TABLET | Freq: Every day | ORAL | 6 refills | Status: DC

## 2018-04-04 MED ORDER — DOFETILIDE 250 MCG PO CAPS: 250.0000 ug | ORAL_CAPSULE | Freq: Two times a day (BID) | ORAL | 6 refills | Status: DC

## 2018-04-04 MED FILL — TIKOSYN 250 MCG CAPS: 250 | 7 days supply | Qty: 14 | Fill #0

## 2018-04-04 NOTE — Progress Notes (Signed)
[facility] for Warfarin Indication: atrial fibrillation  No Known Allergies  Patient Measurements: Height: 5\' 5"  (165.1 cm) Weight: 264 lb 4.8 oz (119.9 kg) IBW/kg (Calculated) : 57  Vital Signs: Temp: 97.6 F (36.4 C) (02/14 0433) Temp Source: Oral (02/14 0433) BP: 119/66 (02/14 0838) Pulse Rate: 74 (02/14 0939)  Labs: Recent Labs    04/02/18 0341 04/03/18 0356 04/04/18 0351  LABPROT 35.0* 32.4* 31.1*  INR 3.55 3.21 3.05  CREATININE 2.31* 1.99* 1.85*    Estimated Creatinine Clearance: 39.3 mL/min (A) (by C-G formula based on SCr of 1.85 mg/dL (H)).  Assessment: 68 year old female on warfarin prior to admission for Afib, admitted today for Tikosyn initiation Admission INR = 4.58  INR = 3.05 today  Dose prior to admission -> 5 mg MWFSat, 7.5 mg TThSun  Goal of Therapy:  INR = 2.5 to 3.5 Monitor platelets by anticoagulation protocol: Yes   Plan:  Home with Warfarin 5 mg MWFSS, 7.5 mg TTh Daily INR  Thank you Anette Guarneri, PharmD  04/04/2018,10:19 AM

## 2018-04-04 NOTE — Care Management Important Message (Signed)
Important Message  Patient Details  Name: [name] MRN: [id number] Date of Birth: July 31, 1950   Medicare Important Message Given:  Yes    [name] 04/04/2018, 11:53 AM

## 2018-04-04 NOTE — Progress Notes (Signed)
Pharmacy Consult for [place] Electrolyte Replacement  Pharmacy consulted to assist in monitoring and replacing electrolytes in this 68 y.o. female admitted on 04/01/2018 undergoing dofetilide initiation. First dofetilide dose: 2/11 @ 8 PM  Labs:    Component Value Date/Time   K 4.0 04/04/2018 0351   MG 2.2 04/04/2018 0351     Plan: Potassium: K >/= 4: No additional supplementation needed  Magnesium: Mg >2: No additional supplementation needed   Patient was given 40 mEq of potassium replacement once to ensure potassium remained above 4, do not recommend discharging patient with prescription for supplementation if she remains off her diuretic.   Thank you for allowing pharmacy to participate in this patient's care    Claiborne Billings, PharmD PGY2 Cardiology Pharmacy Resident Please check AMION for all Pharmacist numbers by unit 04/04/2018 7:23 AM

## 2018-04-04 NOTE — Care Management Note (Signed)
Case Management Note  Patient Details  Name: [name] MRN: [id number] Date of Birth: [birth year]  Subjective/Objective: Pt presented for Tikosyn Load-Persistent [place] Fib. PTA Independent from home.                   Action/Plan: Pt will need Rx for 7 day supply no refills sent to [facility] and pt will get refills from [facility]. No further needs from CM at this time.   Expected Discharge Date:                  Expected Discharge Plan:  Home/Self Care  In-House Referral:  NA  Discharge planning Services  CM Consult, Medication Assistance  Post Acute Care Choice:  NA Choice offered to:  NA  DME Arranged:  N/A DME Agency:  NA  HH Arranged:  NA HH Agency:  NA  Status of Service:  Completed, signed off  If discussed at [place] of Stay Meetings, dates discussed:    Additional Comments:  Bethena Roys, RN 04/04/2018, 10:07 AM

## 2018-04-07 ENCOUNTER — Ambulatory Visit (INDEPENDENT_AMBULATORY_CARE_PROVIDER_SITE_OTHER): Payer: Medicare Other | Admitting: *Deleted

## 2018-04-07 DIAGNOSIS — Z952 Presence of prosthetic heart valve: Secondary | ICD-10-CM

## 2018-04-07 DIAGNOSIS — Z5181 Encounter for therapeutic drug level monitoring: Secondary | ICD-10-CM

## 2018-04-07 LAB — POCT INR: INR: 4.5 — AB (ref 2.0–3.0)

## 2018-04-07 NOTE — Patient Instructions (Addendum)
Description   Skip today's dose, then start taking 1 tablet daily except 1.5 tablets on Sundays and Thursdays. Recheck in 2 weeks. Start eating 1 serving each week of leafy green vegetable.

## 2018-04-11 ENCOUNTER — Ambulatory Visit (HOSPITAL_COMMUNITY): Payer: Medicare Other | Admitting: Nurse Practitioner

## 2018-04-14 ENCOUNTER — Ambulatory Visit (INDEPENDENT_AMBULATORY_CARE_PROVIDER_SITE_OTHER): Payer: Medicare Other | Admitting: Pharmacist Clinician (PhC)/ Clinical Pharmacy Specialist

## 2018-04-14 ENCOUNTER — Encounter (HOSPITAL_COMMUNITY): Payer: Self-pay | Admitting: Nurse Practitioner

## 2018-04-14 ENCOUNTER — Ambulatory Visit (HOSPITAL_COMMUNITY)
Admission: RE | Admit: 2018-04-14 | Discharge: 2018-04-14 | Disposition: A | Discharge status: Home / Self Care | Payer: Medicare Other | Source: Ambulatory Visit | Attending: Nurse Practitioner | Admitting: Nurse Practitioner

## 2018-04-14 VITALS — BP 146/88 | HR 80 | Ht 65.0 in | Wt 260.8 lb

## 2018-04-14 DIAGNOSIS — I10 Essential (primary) hypertension: Secondary | ICD-10-CM | POA: Diagnosis not present

## 2018-04-14 DIAGNOSIS — Z952 Presence of prosthetic heart valve: Secondary | ICD-10-CM

## 2018-04-14 DIAGNOSIS — I739 Peripheral vascular disease, unspecified: Secondary | ICD-10-CM | POA: Insufficient documentation

## 2018-04-14 DIAGNOSIS — I4819 Other persistent atrial fibrillation: Secondary | ICD-10-CM

## 2018-04-14 DIAGNOSIS — M329 Systemic lupus erythematosus, unspecified: Secondary | ICD-10-CM | POA: Diagnosis not present

## 2018-04-14 DIAGNOSIS — Z79899 Other long term (current) drug therapy: Secondary | ICD-10-CM | POA: Insufficient documentation

## 2018-04-14 DIAGNOSIS — I2510 Atherosclerotic heart disease of native coronary artery without angina pectoris: Secondary | ICD-10-CM | POA: Insufficient documentation

## 2018-04-14 DIAGNOSIS — E785 Hyperlipidemia, unspecified: Secondary | ICD-10-CM | POA: Diagnosis not present

## 2018-04-14 DIAGNOSIS — J45909 Unspecified asthma, uncomplicated: Secondary | ICD-10-CM | POA: Insufficient documentation

## 2018-04-14 DIAGNOSIS — Z5181 Encounter for therapeutic drug level monitoring: Secondary | ICD-10-CM

## 2018-04-14 DIAGNOSIS — I480 Paroxysmal atrial fibrillation: Secondary | ICD-10-CM

## 2018-04-14 DIAGNOSIS — Z7901 Long term (current) use of anticoagulants: Secondary | ICD-10-CM | POA: Insufficient documentation

## 2018-04-14 DIAGNOSIS — Z950 Presence of cardiac pacemaker: Secondary | ICD-10-CM | POA: Diagnosis not present

## 2018-04-14 LAB — BASIC METABOLIC PANEL
Anion gap: 10 (ref 5–15)
BUN: 23 mg/dL (ref 8–23)
CALCIUM: 9.4 mg/dL (ref 8.9–10.3)
CO2: 29 mmol/L (ref 22–32)
Chloride: 102 mmol/L (ref 98–111)
Creatinine, Ser: 1.38 mg/dL — ABNORMAL HIGH (ref 0.44–1.00)
GFR calc non Af Amer: 40 mL/min — ABNORMAL LOW (ref >60)
GFR, EST AFRICAN AMERICAN: 46 mL/min — AB (ref >60)
Glucose, Bld: 132 mg/dL — ABNORMAL HIGH (ref 70–99)
Potassium: 3.4 mmol/L — ABNORMAL LOW (ref 3.5–5.1)
Sodium: 141 mmol/L (ref 135–145)

## 2018-04-14 LAB — MAGNESIUM: Magnesium: 1.8 mg/dL (ref 1.7–2.4)

## 2018-04-14 LAB — PROTIME-INR
INR: 3.1
PROTHROMBIN TIME: 31.3 s — AB (ref 11.4–15.2)

## 2018-04-14 NOTE — Progress Notes (Signed)
Primary Care Physician: [name], MD Referring Physician:Dr. Jerl Santos Spayd is a 68 y.o. female with a h/o PPM,CAD, mechanical  AVR on warfarin, persistent afib for the last 30 days per [facility] device alert brought to the attention of [facility], NP/ Dr. Rayann Heman. He feels that is is worth a try to try pt on tikosyn as she is having more issues with edema and shortness of breath in the last several weeks.  He is concerned with her obesity and severe TR that the results may be marginal. The pt is informed of dofetilide admission and what is to be expected with hospitalization for same. She is interested in pursing. EKG shows v paced today at 76 bpm.   F/u in afib clinic, 2/11, she is here for dofetilide admit. She plans [facility] med.  F/u in afib clinic, 2/24, s/p one week dofetilide load. She appears to be back in rhythm with a sensed, v paced ekg tracing. She feels improved, much less short of breath.She asks me about 4 more drugs that she takes on an intermittent basis that will be passed on to PharmD for additional screening.   Today, she denies symptoms of palpitations, chest pain, shortness of breath, orthopnea, PND, lower extremity edema, dizziness, presyncope, syncope, or neurologic sequela. The patient is tolerating medications without difficulties and is otherwise without complaint today.   Past Medical History:  Diagnosis Date  . Antiphospholipid antibody positive 06/28/2017  . Asthma   . CAD (coronary artery disease)    a. normal cors by cath in 03/2015  . Carotid artery disease ([facility])   . Chronic anticoagulation   . Complete heart block ([place]) 02/2013   a. Implantation of a dual chamber pacemaker in 02/2013 by Dr Rayann Heman. STJ model number 3716 pacemaker with model number 2088 right ventricular lead.   [name] Dyslipidemia   . H/O Doppler ultrasound 10/03/2010   carotid duplex - R ICA normal patency; L CCA-ICA bypass gradt patent common to interal cartoid  bypass graft   . H/O mechanical aortic valve replacement   . History of echocardiogram 11/08/2011   EF >55%; mild-mod concentric LVH; trace aortic regurg.; LA mild-mod dilated  . History of nuclear stress test 01/20/2010   normal pattern of perfusion; low risk scan  . Hypertension   . Lupus (systemic lupus erythematosus) ([place])   . PVD (peripheral vascular disease) ([facility])    60% R renal artery stenosis; non-functioning L kidney   Past Surgical History:  Procedure Laterality Date  . AORTIC VALVE REPLACEMENT  11/29/006   [facility]; [place]; on coumadin  . CARDIAC CATHETERIZATION  11/01/2004   define anatomy   . CARDIAC CATHETERIZATION N/A 04/19/2015   Procedure: Coronary Angiography;  Surgeon: Peter M [name], MD;  Location: [facility];  Service: Cardiovascular;  Laterality: N/A;  . CAROTID ENDARTERECTOMY     lt.  . CHOLECYSTECTOMY  1999  . EXPLORATORY LAPAROTOMY  04/12/2012   small bowel perf; ileostomy & R hemicolectomy   . PACEMAKER INSERTION  03/06/2013   SJM Assurity DR PPM implanted by Dr Rayann Heman for complete heart block  . PERMANENT PACEMAKER INSERTION N/A 03/06/2013   Procedure: PERMANENT PACEMAKER INSERTION;  Surgeon: Coralyn Mark, MD;  Location: [facility];  Service: Cardiovascular;  Laterality: N/A;  . TEMPORARY PACEMAKER INSERTION N/A 03/06/2013   Procedure: TEMPORARY PACEMAKER INSERTION;  Surgeon: Coralyn Mark, MD;  Location: [facility] C[facility] LAB;  Service: Cardiovascular;  Laterality:  N/A;  . TUBAL LIGATION  11/1982    Current Outpatient Medications  Medication Sig Dispense Refill  . acetaminophen (TYLENOL) 325 MG tablet Take 650 mg by mouth every 6 (six) hours as needed for moderate pain.     [name] albuterol (PROVENTIL HFA;VENTOLIN HFA) 108 (90 Base) MCG/ACT inhaler Inhale 2 puffs into the lungs every 6 (six) hours as needed for wheezing or shortness of breath. 3 Inhaler 1  . allopurinol (ZYLOPRIM) 100 MG tablet Take 100 mg by mouth daily.     . Calcium Carbonate (CALTRATE 600)  1500 MG TABS Take 1 tablet by mouth 2 (two) times daily.      . cetirizine (ZYRTEC) 10 MG tablet Take 10 mg by mouth at bedtime.    . chlorhexidine (PERIDEX) 0.12 % solution Use as directed 10 mLs in the mouth or throat 2 (two) times daily as needed (before dental appts).   5  . cholecalciferol (VITAMIN D) 1000 UNITS tablet Take 2,000 Units by mouth 2 (two) times daily.     [name] dofetilide (TIKOSYN) 250 MCG capsule Take 1 capsule (250 mcg total) by mouth 2 (two) times daily. 60 capsule 6  . Fluticasone-Salmeterol (WIXELA INHUB) 250-50 MCG/DOSE AEPB Inhale 1 puff into the lungs daily.    . furosemide (LASIX) 20 MG tablet Take 1 tablet (20 mg total) by mouth daily. 30 tablet 6  . hydroxychloroquine (PLAQUENIL) 200 MG tablet Take 200 mg by mouth daily.    . isosorbide mononitrate (IMDUR) 30 MG 24 hr tablet TAKE 1 TABLET BY MOUTH  DAILY (Patient taking differently: Take 30 mg by mouth daily. ) 90 tablet 3  . leflunomide (ARAVA) 10 MG tablet Take 10 mg by mouth daily.    [name] loratadine (CLARITIN) 10 MG tablet Take 10 mg by mouth daily.    . metoprolol succinate (TOPROL-XL) 50 MG 24 hr tablet TAKE 1 TABLET BY MOUTH  DAILY WITH OR IMMEDIATELY  FOLLOWING A MEAL (Patient taking differently: Take 50 mg by mouth daily. ) 90 tablet 3  . predniSONE (DELTASONE) 5 MG tablet Take 5 mg by mouth daily.      . Probiotic Product (PROBIOTIC DAILY PO) Take 1 tablet by mouth daily.    . simvastatin (ZOCOR) 20 MG tablet Take 1 tablet by mouth at  bedtime (Patient taking differently: Take 20 mg by mouth at bedtime. ) 90 tablet 0  . warfarin (COUMADIN) 5 MG tablet TAKE 1 TO 1 AND 1/2 TABLETS BY MOUTH DAILY AS DIRECTED  BY COUMADIN CLINIC (Patient taking differently: Take 5-7.5 mg by mouth See admin instructions. Take 1 tablet (5mg ) on Mon, Wed, Fri, and Sat. Take 1 1/2 tablet (7.5mg ) all other days) 135 tablet 1   No current facility-administered medications for this encounter.     No Known Allergies  Social History    Socioeconomic History  . Marital status: Divorced    Spouse name: Not on file  . Number of children: 3  . Years of education: Not on file  . Highest education level: Not on file  Occupational History  . Occupation: Retired  Scientific laboratory technician  . Financial resource strain: Not on file  . Food insecurity:    Worry: Not on file    Inability: Not on file  . Transportation needs:    Medical: Not on file    Non-medical: Not on file  Tobacco Use  . Smoking status: Never Smoker  . Smokeless tobacco: Never Used  Substance and Sexual Activity  . Alcohol  use: No  . Drug use: No  . Sexual activity: Not on file  Lifestyle  . Physical activity:    Days per week: Not on file    Minutes per session: Not on file  . Stress: Not on file  Relationships  . Social connections:    Talks on phone: Not on file    Gets together: Not on file    Attends religious service: Not on file    Active member of club or organization: Not on file    Attends meetings of clubs or organizations: Not on file    Relationship status: Not on file  . Intimate partner violence:    Fear of current or ex partner: Not on file    Emotionally abused: Not on file    Physically abused: Not on file    Forced sexual activity: Not on file  Other Topics Concern  . Not on file  Social History Narrative   Lives alone in [place].    Family History  Problem Relation Age of Onset  . Alzheimer's disease Father   . Cirrhosis Father   . Cancer Father        prostate  . Heart attack Neg Hx     ROS- All systems are reviewed and negative except as per the HPI above  Physical Exam: Vitals:   04/14/18 1402  BP: (!) 146/88  Pulse: 80  Weight: 118.3 kg  Height: 5\' 5"  (1.651 m)   Wt Readings from Last 3 Encounters:  04/14/18 118.3 kg  04/04/18 119.9 kg  04/01/18 118.4 kg    Labs: Lab Results  Component Value Date   NA 141 04/14/2018   K 3.4 (L) 04/14/2018   CL 102 04/14/2018   CO2 29 04/14/2018   GLUCOSE 132  (H) 04/14/2018   BUN 23 04/14/2018   CREATININE 1.38 (H) 04/14/2018   CALCIUM 9.4 04/14/2018   MG 1.8 04/14/2018   Lab Results  Component Value Date   INR 3.1 04/14/2018   Lab Results  Component Value Date   CHOL 157 06/02/2014   HDL 101 06/02/2014   LDLCALC 40 06/02/2014   TRIG 78 06/02/2014     GEN- The patient is well appearing, alert and oriented x 3 today.   Head- normocephalic, atraumatic Eyes-  Sclera clear, conjunctiva pink Ears- hearing intact Oropharynx- clear Neck- supple, no JVP Lymph- no cervical lymphadenopathy Lungs- Clear to ausculation bilaterally, normal work of breathing Heart- Regular rate and rhythm, no murmurs, rubs or gallops, PMI not laterally displaced GI- soft, NT, ND, + BS Extremities- no clubbing, cyanosis, or edema MS- no significant deformity or atrophy Skin- no rash or lesion Psych- euthymic mood, full affect Neuro- strength and sensation are intact  EKG-atrial sensed, v paced at 80 bpm, qt/qtc prolonged 2/2 to tracing but look stable compared to hospital EKG's. Will have Dr. Rayann Heman review. Echo-Study Conclusions  - Left ventricle: The cavity size was mildly dilated. Wall   thickness was increased in a pattern of mild LVH. Marked   septal-lateral dyssynchrony with septal hypokinesis. The   estimated ejection fraction was 45%. Doppler parameters are   consistent with abnormal left ventricular relaxation (grade 1   diastolic dysfunction). - Aortic valve: Mechanical aortic valve. Mean gradient not   significantly elevated across the aortic valve. There was trivial   regurgitation. Mean gradient (S): 16 mm Hg. - Mitral valve: Mildly calcified annulus. There was moderate   regurgitation. - Left atrium: The atrium was mildly dilated. -  Right ventricle: The cavity size was moderately dilated. Pacer   wire or catheter noted in right ventricle. Systolic function was   normal. - Right atrium: The atrium was moderately dilated. - Tricuspid  valve: There was severe regurgitation with incomplete   coaptation of the tricuspid leaflets. Peak RV-RA gradient (S): 27   mm Hg. - Pulmonary arteries: PA peak pressure: 42 mm Hg (S). - Systemic veins: IVC measured 2.8 cm with < 50% respirophasic   variation, suggesting RA pressure 15 mmHg.  Impressions:  - Mildly dilated LV with EF 45%. Septal-lateral dyssynchrony with   septal hypokinesis. Moderately dilated RV with normal systolic   function. Moderate mitral regurgitation. Severe tricuspid   regurgitation with incomplete coaptation of tricuspid leaflets.   Mechanical aortic valve appeared to function normally. Mild   pulmonary hypertension.    Assessment and Plan: 1. Persistent  afib  S/p dofetilide load  D/c on 250 mcg dofetilide bid Reminded of Tikosyn precautions I will send the list of drugs that pt takes intermittently to be  screened by PharmD Bmet/cbc/INR today Qtc appears stable compared to hospital ekg's, will send for Dr. Rayann Heman review  F/u with Dr. Rayann Heman, 3/23, Dr. Gwenlyn Found 3/17  Geroge Baseman. [place], [facility] [address] [place], [place] [address] [phone]

## 2018-04-15 ENCOUNTER — Other Ambulatory Visit (HOSPITAL_COMMUNITY): Payer: Self-pay | Admitting: *Deleted

## 2018-04-15 ENCOUNTER — Ambulatory Visit (INDEPENDENT_AMBULATORY_CARE_PROVIDER_SITE_OTHER): Payer: Medicare Other | Admitting: *Deleted

## 2018-04-15 DIAGNOSIS — I442 Atrioventricular block, complete: Secondary | ICD-10-CM

## 2018-04-15 MED ORDER — POTASSIUM CHLORIDE CRYS ER 20 MEQ PO TBCR: 20.0000 meq | EXTENDED_RELEASE_TABLET | Freq: Two times a day (BID) | ORAL | 3 refills | Status: DC

## 2018-04-15 MED ORDER — MAGNESIUM 200 MG PO TABS: 200.0000 mg | ORAL_TABLET | Freq: Every day | ORAL | Status: AC

## 2018-04-16 LAB — CUP PACEART REMOTE DEVICE CHECK
Battery Remaining Longevity: 127 mo
Battery Remaining Percentage: 95.5 %
Battery Voltage: 2.99 V
Brady Statistic AP VP Percent: 16 %
Brady Statistic AP VS Percent: 1 %
Brady Statistic AS VS Percent: 1 %
Brady Statistic RA Percent Paced: 14 %
Brady Statistic RV Percent Paced: 99 %
Date Time Interrogation Session: 20200225070014
Implantable Lead Implant Date: 20150116
Implantable Lead Implant Date: 20150116
Implantable Lead Location: 753859
Implantable Lead Location: 753860
Implantable Pulse Generator Implant Date: 20150116
Lead Channel Impedance Value: 360 Ohm
Lead Channel Impedance Value: 580 Ohm
Lead Channel Pacing Threshold Amplitude: 0.75 V
Lead Channel Pacing Threshold Amplitude: 1 V
Lead Channel Pacing Threshold Pulse Width: 0.4 ms
Lead Channel Sensing Intrinsic Amplitude: 5 mV
Lead Channel Setting Pacing Amplitude: 1.25 V
Lead Channel Setting Pacing Amplitude: 2 V
Lead Channel Setting Pacing Pulse Width: 0.4 ms
Lead Channel Setting Sensing Sensitivity: 2 mV
MDC IDC MSMT LEADCHNL RA PACING THRESHOLD PULSEWIDTH: 0.4 ms
MDC IDC MSMT LEADCHNL RV SENSING INTR AMPL: 12 mV
MDC IDC STAT BRADY AS VP PERCENT: 84 %
Pulse Gen Model: 2240
Pulse Gen Serial Number: 7587004

## 2018-04-21 ENCOUNTER — Ambulatory Visit (HOSPITAL_COMMUNITY)
Admission: RE | Admit: 2018-04-21 | Discharge: 2018-04-21 | Disposition: A | Discharge status: Home / Self Care | Payer: Medicare Other | Source: Ambulatory Visit | Attending: Nurse Practitioner | Admitting: Nurse Practitioner

## 2018-04-21 DIAGNOSIS — I480 Paroxysmal atrial fibrillation: Secondary | ICD-10-CM | POA: Diagnosis present

## 2018-04-21 DIAGNOSIS — I4819 Other persistent atrial fibrillation: Secondary | ICD-10-CM | POA: Diagnosis not present

## 2018-04-21 LAB — BASIC METABOLIC PANEL
Anion gap: 9 (ref 5–15)
BUN: 24 mg/dL — ABNORMAL HIGH (ref 8–23)
CO2: 27 mmol/L (ref 22–32)
CREATININE: 1.41 mg/dL — AB (ref 0.44–1.00)
Calcium: 9.8 mg/dL (ref 8.9–10.3)
Chloride: 105 mmol/L (ref 98–111)
GFR calc Af Amer: 45 mL/min — ABNORMAL LOW (ref >60)
GFR calc non Af Amer: 39 mL/min — ABNORMAL LOW (ref >60)
Glucose, Bld: 88 mg/dL (ref 70–99)
Potassium: 4.3 mmol/L (ref 3.5–5.1)
Sodium: 141 mmol/L (ref 135–145)

## 2018-04-21 LAB — MAGNESIUM: Magnesium: 2.2 mg/dL (ref 1.7–2.4)

## 2018-04-22 ENCOUNTER — Encounter: Payer: Self-pay | Admitting: Cardiology

## 2018-04-22 NOTE — Progress Notes (Signed)
Remote pacemaker transmission.   

## 2018-04-29 LAB — PROTIME-INR
INR: 3.3 — ABNORMAL HIGH (ref 0.8–1.2)
Prothrombin Time: 31.2 s — ABNORMAL HIGH (ref 9.1–12.0)

## 2018-04-30 ENCOUNTER — Ambulatory Visit (INDEPENDENT_AMBULATORY_CARE_PROVIDER_SITE_OTHER): Payer: Medicare Other | Admitting: Pharmacist

## 2018-04-30 ENCOUNTER — Telehealth: Payer: Self-pay

## 2018-04-30 DIAGNOSIS — Z5181 Encounter for therapeutic drug level monitoring: Secondary | ICD-10-CM

## 2018-04-30 DIAGNOSIS — Z952 Presence of prosthetic heart valve: Secondary | ICD-10-CM

## 2018-04-30 NOTE — Progress Notes (Signed)
Called the pt and let instructed them to stay on the same dose and to recheck in 3 weeks

## 2018-05-01 ENCOUNTER — Encounter: Payer: Self-pay | Admitting: Internal Medicine

## 2018-05-06 ENCOUNTER — Encounter: Payer: Self-pay | Admitting: Cardiovascular Disease

## 2018-05-06 ENCOUNTER — Other Ambulatory Visit: Payer: Self-pay

## 2018-05-06 ENCOUNTER — Ambulatory Visit: Payer: Medicare Other | Admitting: Cardiovascular Disease

## 2018-05-06 DIAGNOSIS — I10 Essential (primary) hypertension: Secondary | ICD-10-CM | POA: Diagnosis not present

## 2018-05-06 DIAGNOSIS — I442 Atrioventricular block, complete: Secondary | ICD-10-CM

## 2018-05-06 DIAGNOSIS — E782 Mixed hyperlipidemia: Secondary | ICD-10-CM

## 2018-05-06 DIAGNOSIS — Z952 Presence of prosthetic heart valve: Secondary | ICD-10-CM

## 2018-05-06 DIAGNOSIS — I480 Paroxysmal atrial fibrillation: Secondary | ICD-10-CM

## 2018-05-06 NOTE — Assessment & Plan Note (Signed)
History of hyperlipidemia on statin therapy with lipid profile followed by her PCP

## 2018-05-06 NOTE — Assessment & Plan Note (Signed)
History of [facility] AVR performed by Dr. [name] September 2006 because of severe aortic insufficiency.  She had normal coronary arteries at that time.  Her most recent 2D echo performed 06/05/2017 revealed a decline in her LV function to 45% with a well-functioning aortic mechanical prosthesis and a 38 mm peak gradient.

## 2018-05-06 NOTE — Patient Instructions (Signed)
Medication Instructions:  Your physician recommends that you continue on your current medications as directed. Please refer to the Current Medication list given to you today.  If you need a refill on your cardiac medications before your next appointment, please call your pharmacy.   Lab work: NONE If you have labs (blood work) drawn today and your tests are completely normal, you will receive your results only by: [name] MyChart Message (if you have MyChart) OR . A paper copy in the mail If you have any lab test that is abnormal or we need to change your treatment, we will call you to review the results.  Testing/Procedures: NONE  Follow-Up: At [facility], you and your health needs are our priority.  As part of our continuing mission to provide you with exceptional heart care, we have created designated Provider Care Teams.  These Care Teams include your primary Cardiologist (physician) and Advanced Practice Providers (APPs -  Physician Assistants and Nurse Practitioners) who all work together to provide you with the care you need, when you need it. . You will need a follow up appointment in [place], PA-C AND IN [place].  Please call our office 2 months in advance to schedule each appointment.  You may see Dr. Gwenlyn Found or one of the following Advanced Practice Providers on your designated Care Team:   . Kerin Ransom, [place] . Almyra Deforest, PA-C . Fabian Sharp, PA-C . Jory Sims, DNP . Rosaria Ferries, PA-C . Roby Lofts, PA-C . Sande Rives, PA-C

## 2018-05-06 NOTE — Assessment & Plan Note (Signed)
History of essential hypertension blood pressure measured today at 141/84.  She is on metoprolol.  Continue current meds at current dosing

## 2018-05-06 NOTE — Assessment & Plan Note (Signed)
History of paroxysmal atrial fibrillation for which she was symptomatic from with increasing dyspnea on exertion status post recent hospitalization 04/01/2018 for Tikosyn load.  She converted to sinus rhythm and was discharged 3 days later.  She saw Roderic Palau, NP in the office several days later and has maintained sinus rhythm feeling clinically improved.

## 2018-05-06 NOTE — Progress Notes (Signed)
05/06/2018 [name]   04/18/1950  [id number]  Primary Physician [name], MD Primary Cardiologist: [name] MD FACP, [place], [place], [name]  HPI:  [name] is a 68 y.o.    moderately overweight, almost divorced Caucasian female mother of 61, grandmother to 2 grandchildren who I last saw in the office  04/19/2017. She had a St. Jude AVR performed by Dr. Ceasar Mons September of 2006 because of severe aortic insufficiency. She had normal coronary arteries by cath at that time. A look at her renal arteries and demonstrated a 60% right renal artery stenosis and patent accessory left renal arteries. By duplex she does have a non-functioning left kidney. Her other problems include hypertension and hyperlipidemia. She also has a history of lupus followed by Dr. Jalene Mullet. An echo performed a year ago showed a well-functioning aortic prosthesis, and recent renal Dopplers revealed a widely patent right renal artery. She had a small bowel obstruction and underwent surgery at [facility] for perforated small bowel. She had an exploratory laparotomy with ileostomy and a right hemicolectomy 04/12/12. She saw [name] PAC back in our office 06/12/12 and her medicines were adjusted.  She was admitted in early January for symptomatic complete heart block underwent permanent pacemaker insertion by Dr. Thompson Grayer.[name] She was remitted approximately week later with orthostatic hypotension and was dehydrated. She responded to fluid resuscitation.  Since I saw her a year ago she was admitted with heart failure requiring diuresis secondary to diastolic dysfunction and dietary indiscretion. In addition, she recently underwent an cardiac catheterization by Dr. [name] because of chest pain 04/19/15 revealing normal coronary arteries. She was admitted to [facility] of volume overload 12/21/15 and was diuresed Since I saw her a year ago she has had an echo performed 06/03/2017 that showed a slight  decline in LV function with an EF of 45%, well-functioning aortic mechanical prosthesis with a peak aortic gradient of 30 mmHg.  She was complaining of increasing dyspnea.  She was found to be in A. fib and was admitted for Tikosyn load on 04/01/2018.  She converted to sinus rhythm and feels clinically improved.   Current Meds  Medication Sig  . acetaminophen (TYLENOL) 325 MG tablet Take 650 mg by mouth every 6 (six) hours as needed for moderate pain.   [name] albuterol (PROVENTIL HFA;VENTOLIN HFA) 108 (90 Base) MCG/ACT inhaler Inhale 2 puffs into the lungs every 6 (six) hours as needed for wheezing or shortness of breath.  . allopurinol (ZYLOPRIM) 100 MG tablet Take 100 mg by mouth daily.   . Calcium Carbonate (CALTRATE 600) 1500 MG TABS Take 1 tablet by mouth 2 (two) times daily.    . cetirizine (ZYRTEC) 10 MG tablet Take 10 mg by mouth at bedtime.  . chlorhexidine (PERIDEX) 0.12 % solution Use as directed 10 mLs in the mouth or throat 2 (two) times daily as needed (before dental appts).   . cholecalciferol (VITAMIN D) 1000 UNITS tablet Take 2,000 Units by mouth 2 (two) times daily.   [name] dofetilide (TIKOSYN) 250 MCG capsule Take 1 capsule (250 mcg total) by mouth 2 (two) times daily.  . Fluticasone-Salmeterol (WIXELA INHUB) 250-50 MCG/DOSE AEPB Inhale 1 puff into the lungs daily.  . furosemide (LASIX) 20 MG tablet Take 1 tablet (20 mg total) by mouth daily.  . hydroxychloroquine (PLAQUENIL) 200 MG tablet Take 200 mg by mouth daily.  . isosorbide mononitrate (IMDUR) 30 MG 24 hr tablet TAKE 1 TABLET BY MOUTH  DAILY (Patient taking differently: Take 30 mg by mouth daily. )  . leflunomide (ARAVA) 10 MG tablet Take 10 mg by mouth daily.  [name] loratadine (CLARITIN) 10 MG tablet Take 10 mg by mouth daily.  . Magnesium 200 MG TABS Take 1 tablet (200 mg total) by mouth daily.  . metoprolol succinate (TOPROL-XL) 50 MG 24 hr tablet TAKE 1 TABLET BY MOUTH  DAILY WITH OR IMMEDIATELY  FOLLOWING A MEAL (Patient taking  differently: Take 50 mg by mouth daily. )  . potassium chloride SA (K-DUR,KLOR-CON) 20 MEQ tablet Take 1 tablet (20 mEq total) by mouth 2 (two) times daily.  . predniSONE (DELTASONE) 5 MG tablet Take 5 mg by mouth daily.    . Probiotic Product (PROBIOTIC DAILY PO) Take 1 tablet by mouth daily.  . simvastatin (ZOCOR) 20 MG tablet Take 1 tablet by mouth at  bedtime (Patient taking differently: Take 20 mg by mouth at bedtime. )  . warfarin (COUMADIN) 5 MG tablet TAKE 1 TO 1 AND 1/2 TABLETS BY MOUTH DAILY AS DIRECTED  BY COUMADIN CLINIC (Patient taking differently: Take 5-7.5 mg by mouth See admin instructions. Take 1 tablet (5mg ) on Mon, Wed, Fri, and Sat. Take 1 1/2 tablet (7.5mg ) all other days)     No Known Allergies  Social History   Socioeconomic History  . Marital status: Divorced    Spouse name: Not on file  . Number of children: 3  . Years of education: Not on file  . Highest education level: Not on file  Occupational History  . Occupation: Retired  Scientific laboratory technician  . Financial resource strain: Not on file  . Food insecurity:    Worry: Not on file    Inability: Not on file  . Transportation needs:    Medical: Not on file    Non-medical: Not on file  Tobacco Use  . Smoking status: Never Smoker  . Smokeless tobacco: Never Used  Substance and Sexual Activity  . Alcohol use: No  . Drug use: No  . Sexual activity: Not on file  Lifestyle  . Physical activity:    Days per week: Not on file    Minutes per session: Not on file  . Stress: Not on file  Relationships  . Social connections:    Talks on phone: Not on file    Gets together: Not on file    Attends religious service: Not on file    Active member of club or organization: Not on file    Attends meetings of clubs or organizations: Not on file    Relationship status: Not on file  . Intimate partner violence:    Fear of current or ex partner: Not on file    Emotionally abused: Not on file    Physically abused: Not on  file    Forced sexual activity: Not on file  Other Topics Concern  . Not on file  Social History Narrative   Lives alone in [place].     Review of Systems: General: negative for chills, fever, night sweats or weight changes.  Cardiovascular: negative for chest pain, dyspnea on exertion, edema, orthopnea, palpitations, paroxysmal nocturnal dyspnea or shortness of breath Dermatological: negative for rash Respiratory: negative for cough or wheezing Urologic: negative for hematuria Abdominal: negative for nausea, vomiting, diarrhea, bright red blood per rectum, melena, or hematemesis Neurologic: negative for visual changes, syncope, or dizziness All other systems reviewed and are otherwise negative except as noted above.    Blood pressure [name])  141/84, pulse 81, height 5\' 5"  (1.651 m), weight 257 lb 6.4 oz (116.8 kg).  General appearance: alert and no distress Neck: no adenopathy, no carotid bruit, no JVD, supple, symmetrical, trachea midline and thyroid not enlarged, symmetric, no tenderness/mass/nodules Lungs: clear to auscultation bilaterally Heart: Crisp prosthetic valve sounds with a soft outflow tract murmur Extremities: extremities normal, atraumatic, no cyanosis or edema Pulses: 2+ and symmetric Skin: Skin color, texture, turgor normal. No rashes or lesions Neurologic: Alert and oriented X 3, normal strength and tone. Normal symmetric reflexes. Normal coordination and gait  EKG not performed today  ASSESSMENT AND PLAN:   Complete heart block (HCC) History of complete heart block status post St Jude permanent transvenous pacemaker insertion January 2015 followed by Dr. Rayann Heman  S/P AVR (aortic valve replacement) History of Saint Jude AVR performed by Dr. [name] September 2006 because of severe aortic insufficiency.  She had normal coronary arteries at that time.  Her most recent 2D echo performed 06/05/2017 revealed a decline in her LV function to 45% with a  well-functioning aortic mechanical prosthesis and a 38 mm peak gradient.  Hyperlipidemia History of hyperlipidemia on statin therapy with lipid profile followed by her PCP  Essential hypertension History of essential hypertension blood pressure measured today at 141/84.  She is on metoprolol.  Continue current meds at current dosing  PAF (paroxysmal atrial fibrillation) (HCC) History of paroxysmal atrial fibrillation for which she was symptomatic from with increasing dyspnea on exertion status post recent hospitalization 04/01/2018 for Tikosyn load.  She converted to sinus rhythm and was discharged 3 days later.  She saw Roderic Palau, NP in the office several days later and has maintained sinus rhythm feeling clinically improved.      [name] MD FACP,FACC,FAHA, [facility] 05/06/2018 2:08 PM

## 2018-05-06 NOTE — Assessment & Plan Note (Signed)
History of complete heart block status post St Jude permanent transvenous pacemaker insertion January 2015 followed by Dr. Rayann Heman

## 2018-05-08 ENCOUNTER — Ambulatory Visit: Payer: Medicare Other | Admitting: Internal Medicine

## 2018-05-08 ENCOUNTER — Other Ambulatory Visit: Payer: Self-pay

## 2018-05-08 ENCOUNTER — Telehealth (INDEPENDENT_AMBULATORY_CARE_PROVIDER_SITE_OTHER): Payer: Medicare Other | Admitting: Internal Medicine

## 2018-05-08 DIAGNOSIS — I10 Essential (primary) hypertension: Secondary | ICD-10-CM | POA: Diagnosis not present

## 2018-05-08 DIAGNOSIS — I442 Atrioventricular block, complete: Secondary | ICD-10-CM

## 2018-05-08 DIAGNOSIS — I4819 Other persistent atrial fibrillation: Secondary | ICD-10-CM | POA: Diagnosis not present

## 2018-05-08 NOTE — Progress Notes (Signed)
Electrophysiology TeleHealth Note   Due to national recommendations of social distancing due to [place] 19, Audio/video telehealth visit is felt to be most appropriate for this patient at this time.  I have discussed at length and have obtained verbal consent today for the patient's consent to telehealth for [facility].   Date:  05/08/2018   ID:  [name], DOB 17-Aug-1950, MRN [id number]  Location: home  Provider location: [address] [place] Evaluation Performed: Follow-up visit  PCP:  [name], MD  Cardiologist:  Dr Gwenlyn Found Primary Electrophysiologist: Thompson Grayer, MD    CC: afib    [name] is a 68 y.o. female who presents via audio conferencing for a telehealth visit today.  Since last being seen in our clinic, the patient reports doing very well.  She states "I am doing 100% better since staring tikosyn".  Today, she denies symptoms of palpitations, chest pain, shortness of breath,  lower extremity edema, dizziness, presyncope, or syncope.  The patient is otherwise without complaint today.  The patient denies symptoms of fevers, chills, cough, or new SOB worrisome for COVID 19.   Past Medical History:  Diagnosis Date  . Antiphospholipid antibody positive 06/28/2017  . Asthma   . CAD (coronary artery disease)    a. normal cors by cath in 03/2015  . Carotid artery disease ([place])   . Chronic anticoagulation   . Complete heart block ([place]) 02/2013   a. Implantation of a dual chamber pacemaker in 02/2013 by Dr Rayann Heman. STJ model number 1443 pacemaker with model number 2088 right ventricular lead.   [name] Dyslipidemia   . H/O Doppler ultrasound 10/03/2010   carotid duplex - R ICA normal patency; L CCA-ICA bypass gradt patent common to interal cartoid bypass graft   . H/O mechanical aortic valve replacement   . History of echocardiogram 11/08/2011   EF >55%; mild-mod concentric LVH; trace aortic regurg.; LA mild-mod dilated  . History of nuclear stress test  01/20/2010   normal pattern of perfusion; low risk scan  . Hypertension   . Lupus (systemic lupus erythematosus) ([place])   . PVD (peripheral vascular disease) (HCC)    60% R renal artery stenosis; non-functioning L kidney   Past Surgical History:  Procedure Laterality Date  . AORTIC VALVE REPLACEMENT  11/29/006   [place]; [place]; on coumadin  . CARDIAC CATHETERIZATION  11/01/2004   define anatomy   . CARDIAC CATHETERIZATION N/A 04/19/2015   Procedure: Coronary Angiography;  Surgeon: Peter M [name], MD;  Location: [facility];  Service: Cardiovascular;  Laterality: N/A;  . CAROTID ENDARTERECTOMY     lt.  . CHOLECYSTECTOMY  1999  . EXPLORATORY LAPAROTOMY  04/12/2012   small bowel perf; ileostomy & R hemicolectomy   . PACEMAKER INSERTION  03/06/2013   SJM Assurity DR PPM implanted by Dr Rayann Heman for complete heart block  . PERMANENT PACEMAKER INSERTION N/A 03/06/2013   Procedure: PERMANENT PACEMAKER INSERTION;  Surgeon: Coralyn Mark, MD;  Location: [facility] CATH [facility];  Service: Cardiovascular;  Laterality: N/A;  . TEMPORARY PACEMAKER INSERTION N/A 03/06/2013   Procedure: TEMPORARY PACEMAKER INSERTION;  Surgeon: Coralyn Mark, MD;  Location: [facility] CATH LAB;  Service: Cardiovascular;  Laterality: N/A;  . TUBAL LIGATION  11/1982     Current Outpatient Medications  Medication Sig Dispense Refill  . acetaminophen (TYLENOL) 325 MG tablet Take 650 mg by mouth every 6 (six) hours as needed for moderate pain.     [name]  albuterol (PROVENTIL HFA;VENTOLIN HFA) 108 (90 Base) MCG/ACT inhaler Inhale 2 puffs into the lungs every 6 (six) hours as needed for wheezing or shortness of breath. 3 Inhaler 1  . allopurinol (ZYLOPRIM) 100 MG tablet Take 100 mg by mouth daily.     . Calcium Carbonate (CALTRATE 600) 1500 MG TABS Take 1 tablet by mouth 2 (two) times daily.      . cetirizine (ZYRTEC) 10 MG tablet Take 10 mg by mouth at bedtime.    . chlorhexidine (PERIDEX) 0.12 % solution Use as directed 10 mLs in the  mouth or throat 2 (two) times daily as needed (before dental appts).   5  . cholecalciferol (VITAMIN D) 1000 UNITS tablet Take 2,000 Units by mouth 2 (two) times daily.     [name] dofetilide (TIKOSYN) 250 MCG capsule Take 1 capsule (250 mcg total) by mouth 2 (two) times daily. 60 capsule 6  . Fluticasone-Salmeterol (WIXELA INHUB) 250-50 MCG/DOSE AEPB Inhale 1 puff into the lungs daily.    . furosemide (LASIX) 20 MG tablet Take 1 tablet (20 mg total) by mouth daily. 30 tablet 6  . hydroxychloroquine (PLAQUENIL) 200 MG tablet Take 200 mg by mouth daily.    . isosorbide mononitrate (IMDUR) 30 MG 24 hr tablet TAKE 1 TABLET BY MOUTH  DAILY (Patient taking differently: Take 30 mg by mouth daily. ) 90 tablet 3  . leflunomide (ARAVA) 10 MG tablet Take 10 mg by mouth daily.    [name] loratadine (CLARITIN) 10 MG tablet Take 10 mg by mouth daily.    . Magnesium 200 MG TABS Take 1 tablet (200 mg total) by mouth daily. 30 each   . metoprolol succinate (TOPROL-XL) 50 MG 24 hr tablet TAKE 1 TABLET BY MOUTH  DAILY WITH OR IMMEDIATELY  FOLLOWING A MEAL (Patient taking differently: Take 50 mg by mouth daily. ) 90 tablet 3  . potassium chloride SA (K-DUR,KLOR-CON) 20 MEQ tablet Take 1 tablet (20 mEq total) by mouth 2 (two) times daily. 60 tablet 3  . predniSONE (DELTASONE) 5 MG tablet Take 5 mg by mouth daily.      . Probiotic Product (PROBIOTIC DAILY PO) Take 1 tablet by mouth daily.    . simvastatin (ZOCOR) 20 MG tablet Take 1 tablet by mouth at  bedtime (Patient taking differently: Take 20 mg by mouth at bedtime. ) 90 tablet 0  . warfarin (COUMADIN) 5 MG tablet TAKE 1 TO 1 AND 1/2 TABLETS BY MOUTH DAILY AS DIRECTED  BY COUMADIN CLINIC (Patient taking differently: Take 5-7.5 mg by mouth See admin instructions. Take 1 tablet (5mg ) on Mon, Wed, Fri, and Sat. Take 1 1/2 tablet (7.5mg ) all other days) 135 tablet 1   No current facility-administered medications for this visit.     Allergies:   Patient has no known allergies.    Social History:  The patient  reports that she has never smoked. She has never used smokeless tobacco. She reports that she does not drink alcohol or use drugs.   Family History:  The patient's  family history includes Alzheimer's disease in her father; Cancer in her father; Cirrhosis in her father.    ROS:  Please see the history of present illness.   All other systems are personally reviewed and negative.   Recent Labs: 09/07/2017: ALT 36 03/17/2018: Hemoglobin 11.8; Platelets 216; TSH 3.900 04/21/2018: BUN 24; Creatinine, Ser 1.41; Magnesium 2.2; Potassium 4.3; Sodium 141  personally reviewed   Wt Readings from Last 3 Encounters:  05/06/18  257 lb 6.4 oz (116.8 kg)  04/14/18 260 lb 12.8 oz (118.3 kg)  04/04/18 264 lb 4.8 oz (119.9 kg)      Other studies personally reviewed: Additional studies/ records that were reviewed today include: Dr Kennon Holter recent notes, ekg from  04/14/2018, labs 04/21/2018 Review of the above records today demonstrates: as above  Her PPM remote from 04/15/2018   ASSESSMENT AND PLAN:  1.  Persistent afib Doing great with tikosyn! No changes today We discussed lifestyle modification at length today  2. Complete heart block Remote from 04/15/2018 reviewed and reveals normal device function Continue to follow remotely  3. S/p AVR Mechanical  Will route message to coumadin clinic for guidance given COVID 19.  4. HTN Stable No change required today  5. COVID 19 screen The patient denies symptoms of COVID 19 at this time.  The importance of social distancing was discussed today.  Follow-up:  AF clinic in 3 months  Current medicines are reviewed at length with the patient today.   The patient does not have concerns regarding her medicines.  The following changes were made today:  none  Labs/ tests ordered today include:  No orders of the defined types were placed in this encounter.    Patient Risk:  after full review of this patients clinical  status, I feel that they are at moderate risk at this time.  Today, I have spent 13 minutes with the patient with telehealth technology discussing afib .    Signed, Thompson Grayer MD, [facility] 05/08/2018 10:36 AM   [facility] [address] [place]  [address] [phone] (office) [phone] (fax)

## 2018-05-09 ENCOUNTER — Telehealth: Payer: Self-pay | Admitting: Cardiovascular Disease

## 2018-05-09 NOTE — Telephone Encounter (Signed)
Left detailed  message of app time and call back of COVID 19 screening ./cy

## 2018-05-09 NOTE — Telephone Encounter (Signed)
New Message   Patient's states she needs a new order to have coumadin checked at [facility].  Please call patient.

## 2018-05-09 NOTE — Telephone Encounter (Signed)
Pt would like to have fingerstick instead of regular lab draw.  Will forward to [employer] [facility].

## 2018-05-09 NOTE — Telephone Encounter (Signed)
Please send order to [facility] for INR check. Pharmacist is out of the office until Monday.

## 2018-05-09 NOTE — Telephone Encounter (Signed)
INR due 1st week of April. Appointment given for 4/6 at 9/15 am.   Clinic will call patient back 1-2 days prior to appointment for screening and instructions regarding [id number]

## 2018-05-09 NOTE — Telephone Encounter (Signed)
Not sure when INR is due pt stated could come to office Please advise ./cy

## 2018-05-12 ENCOUNTER — Telehealth: Payer: Self-pay

## 2018-05-12 ENCOUNTER — Ambulatory Visit: Payer: Medicare Other | Admitting: Internal Medicine

## 2018-05-12 NOTE — Telephone Encounter (Signed)
LMOM FOR COVID SCREEN AND RS FOR [place]

## 2018-05-12 NOTE — Telephone Encounter (Signed)

## 2018-05-13 ENCOUNTER — Other Ambulatory Visit: Payer: Self-pay

## 2018-05-13 ENCOUNTER — Ambulatory Visit (INDEPENDENT_AMBULATORY_CARE_PROVIDER_SITE_OTHER): Payer: Medicare Other | Admitting: Pharmacist

## 2018-05-13 DIAGNOSIS — Z5181 Encounter for therapeutic drug level monitoring: Secondary | ICD-10-CM | POA: Diagnosis not present

## 2018-05-13 DIAGNOSIS — Z952 Presence of prosthetic heart valve: Secondary | ICD-10-CM

## 2018-05-13 LAB — POCT INR: INR: 4 — AB (ref 2.0–3.0)

## 2018-05-13 NOTE — Telephone Encounter (Signed)

## 2018-05-13 NOTE — Patient Instructions (Signed)
Description   Skip warfarin today, then continue taking 1 tablet daily except 1.5 tablets on Sundays and Thursdays. Recheck in 3 weeks.

## 2018-05-28 ENCOUNTER — Ambulatory Visit: Payer: Medicare Other | Admitting: Internal Medicine

## 2018-06-02 ENCOUNTER — Telehealth: Payer: Self-pay | Admitting: Pharmacist

## 2018-06-02 ENCOUNTER — Telehealth: Payer: Self-pay

## 2018-06-02 NOTE — Telephone Encounter (Signed)

## 2018-06-02 NOTE — Telephone Encounter (Signed)
lmom for prescreen/drive up 

## 2018-06-03 ENCOUNTER — Ambulatory Visit (INDEPENDENT_AMBULATORY_CARE_PROVIDER_SITE_OTHER): Payer: Medicare Other

## 2018-06-03 ENCOUNTER — Other Ambulatory Visit: Payer: Self-pay

## 2018-06-03 DIAGNOSIS — Z5181 Encounter for therapeutic drug level monitoring: Secondary | ICD-10-CM | POA: Diagnosis not present

## 2018-06-03 DIAGNOSIS — Z952 Presence of prosthetic heart valve: Secondary | ICD-10-CM

## 2018-06-03 LAB — POCT INR: INR: 3.7 — AB (ref 2.0–3.0)

## 2018-06-03 NOTE — Patient Instructions (Signed)
Description   Called spoke with pt, advised to skip today's dosage of warfarin, then start taking 1 tablet daily except 1.5 tablets on Thursdays. Recheck in 3 weeks.

## 2018-06-10 ENCOUNTER — Encounter (HOSPITAL_COMMUNITY): Payer: Medicare Other

## 2018-06-23 ENCOUNTER — Telehealth: Payer: Self-pay

## 2018-06-23 NOTE — Telephone Encounter (Signed)

## 2018-06-24 ENCOUNTER — Ambulatory Visit (INDEPENDENT_AMBULATORY_CARE_PROVIDER_SITE_OTHER): Payer: Medicare Other | Admitting: Pharmacist

## 2018-06-24 ENCOUNTER — Other Ambulatory Visit: Payer: Self-pay

## 2018-06-24 DIAGNOSIS — Z952 Presence of prosthetic heart valve: Secondary | ICD-10-CM | POA: Diagnosis not present

## 2018-06-24 DIAGNOSIS — Z5181 Encounter for therapeutic drug level monitoring: Secondary | ICD-10-CM

## 2018-06-24 LAB — POCT INR: INR: 3.5 — AB (ref 2.0–3.0)

## 2018-07-03 ENCOUNTER — Telehealth: Payer: Medicare Other | Admitting: Cardiovascular Disease

## 2018-07-03 ENCOUNTER — Telehealth: Payer: Self-pay | Admitting: Cardiovascular Disease

## 2018-07-03 NOTE — Telephone Encounter (Signed)
New Message             Patient is calling to get a Rx for a  INR machine  From [facility]   Their  Fax # [phone]  Office [phone]. Pls call patient to advise.

## 2018-07-03 NOTE — Telephone Encounter (Signed)
Message left to our are representative for [employer]. Will update patient with any information received

## 2018-07-07 NOTE — Telephone Encounter (Signed)
Patient called today, left message that she actually would prefer to get the home meter.  Called Jose at [employer] and left message for them to reactivate her applicaton

## 2018-07-07 NOTE — Telephone Encounter (Signed)
LMOM; patient to call back if still interested on home POC machine.  Otherwise, okay to go to laboratory for INR checks

## 2018-07-07 NOTE — Telephone Encounter (Signed)
Received call back from [facility] ([place] area rep). Patient declined meter in the past due to cost and case was closed.

## 2018-07-11 ENCOUNTER — Telehealth: Payer: Self-pay

## 2018-07-11 NOTE — Telephone Encounter (Signed)

## 2018-07-15 ENCOUNTER — Other Ambulatory Visit: Payer: Self-pay

## 2018-07-15 ENCOUNTER — Ambulatory Visit (INDEPENDENT_AMBULATORY_CARE_PROVIDER_SITE_OTHER): Payer: Medicare Other | Admitting: *Deleted

## 2018-07-15 DIAGNOSIS — Z5181 Encounter for therapeutic drug level monitoring: Secondary | ICD-10-CM | POA: Diagnosis not present

## 2018-07-15 DIAGNOSIS — Z952 Presence of prosthetic heart valve: Secondary | ICD-10-CM

## 2018-07-15 DIAGNOSIS — I442 Atrioventricular block, complete: Secondary | ICD-10-CM | POA: Diagnosis not present

## 2018-07-15 DIAGNOSIS — I4819 Other persistent atrial fibrillation: Secondary | ICD-10-CM

## 2018-07-15 LAB — CUP PACEART REMOTE DEVICE CHECK
Date Time Interrogation Session: 20200526125049
Implantable Lead Implant Date: 20150116
Implantable Lead Implant Date: 20150116
Implantable Lead Location: 753859
Implantable Lead Location: 753860
Implantable Lead Model: 5076
Implantable Pulse Generator Implant Date: 20150116
Pulse Gen Model: 2240
Pulse Gen Serial Number: 7587004

## 2018-07-15 LAB — POCT INR: INR: 2.6 (ref 2.0–3.0)

## 2018-07-15 NOTE — Patient Instructions (Signed)
Description   Spoke with pt and instructed pt continue taking 1 tablet daily except 1.5 tablets on Thursdays. Recheck in 4 weeks.

## 2018-07-22 ENCOUNTER — Ambulatory Visit (HOSPITAL_BASED_OUTPATIENT_CLINIC_OR_DEPARTMENT_OTHER)
Admission: RE | Admit: 2018-07-22 | Discharge: 2018-07-22 | Disposition: A | Discharge status: Home / Self Care | Payer: Medicare Other | Source: Ambulatory Visit | Attending: Cardiovascular Disease | Admitting: Cardiovascular Disease

## 2018-07-22 ENCOUNTER — Other Ambulatory Visit (HOSPITAL_COMMUNITY): Payer: Self-pay | Admitting: Cardiovascular Disease

## 2018-07-22 ENCOUNTER — Ambulatory Visit (HOSPITAL_COMMUNITY)
Admission: RE | Admit: 2018-07-22 | Discharge: 2018-07-22 | Disposition: A | Discharge status: Home / Self Care | Payer: Medicare Other | Source: Ambulatory Visit | Attending: Cardiology | Admitting: Cardiology

## 2018-07-22 ENCOUNTER — Other Ambulatory Visit: Payer: Self-pay

## 2018-07-22 DIAGNOSIS — I701 Atherosclerosis of renal artery: Secondary | ICD-10-CM | POA: Diagnosis present

## 2018-07-22 DIAGNOSIS — I6522 Occlusion and stenosis of left carotid artery: Secondary | ICD-10-CM

## 2018-07-22 DIAGNOSIS — I6523 Occlusion and stenosis of bilateral carotid arteries: Secondary | ICD-10-CM | POA: Diagnosis not present

## 2018-07-22 NOTE — Progress Notes (Signed)
Hypertension, hyperlipidemia, no history of smoking, coronary artery disease.

## 2018-07-23 ENCOUNTER — Telehealth: Payer: Self-pay | Admitting: Cardiovascular Disease

## 2018-07-23 NOTE — Telephone Encounter (Signed)
Phone visit, call [phone], no my chart, pre reg complete, consent obtained -- ttf

## 2018-07-24 NOTE — Progress Notes (Signed)
Remote pacemaker transmission.   

## 2018-07-30 ENCOUNTER — Telehealth: Payer: Self-pay

## 2018-07-30 ENCOUNTER — Telehealth (INDEPENDENT_AMBULATORY_CARE_PROVIDER_SITE_OTHER): Payer: Medicare Other | Admitting: Cardiovascular Disease

## 2018-07-30 VITALS — Ht 66.0 in | Wt 257.0 lb

## 2018-07-30 DIAGNOSIS — Z7901 Long term (current) use of anticoagulants: Secondary | ICD-10-CM

## 2018-07-30 DIAGNOSIS — Z952 Presence of prosthetic heart valve: Secondary | ICD-10-CM

## 2018-07-30 DIAGNOSIS — Z950 Presence of cardiac pacemaker: Secondary | ICD-10-CM

## 2018-07-30 DIAGNOSIS — I701 Atherosclerosis of renal artery: Secondary | ICD-10-CM

## 2018-07-30 DIAGNOSIS — I480 Paroxysmal atrial fibrillation: Secondary | ICD-10-CM | POA: Diagnosis not present

## 2018-07-30 DIAGNOSIS — E782 Mixed hyperlipidemia: Secondary | ICD-10-CM

## 2018-07-30 DIAGNOSIS — I10 Essential (primary) hypertension: Secondary | ICD-10-CM

## 2018-07-30 DIAGNOSIS — I5189 Other ill-defined heart diseases: Secondary | ICD-10-CM

## 2018-07-30 DIAGNOSIS — I6523 Occlusion and stenosis of bilateral carotid arteries: Secondary | ICD-10-CM

## 2018-07-30 DIAGNOSIS — I5043 Acute on chronic combined systolic (congestive) and diastolic (congestive) heart failure: Secondary | ICD-10-CM | POA: Diagnosis not present

## 2018-07-30 DIAGNOSIS — I6522 Occlusion and stenosis of left carotid artery: Secondary | ICD-10-CM

## 2018-07-30 NOTE — Patient Instructions (Addendum)
Medication Instructions:  Your physician recommends that you continue on your current medications as directed. Please refer to the Current Medication list given to you today.  If you need a refill on your cardiac medications before your next appointment, please call your pharmacy.   Lab work: NONE If you have labs (blood work) drawn today and your tests are completely normal, you will receive your results only by: [name] MyChart Message (if you have MyChart) OR . A paper copy in the mail If you have any lab test that is abnormal or we need to change your treatment, we will call you to review the results.  Testing/Procedures: Your physician has requested that you have an echocardiogram. Echocardiography is a painless test that uses sound waves to create images of your heart. It provides your doctor with information about the size and shape of your heart and how well your heart's chambers and valves are working. This procedure takes approximately one hour. There are no restrictions for this procedure. LOCATION: [place], [place] [address] TO BE SCHEDULED FOR NEXT AVAILABLE APPOINTMENT. YOU WILL BE CONTACTED BY A SCHEDULER TO SET UP THIS APPOINTMENT.  Your physician has requested that you have a renal artery duplex. During this test, an ultrasound is used to evaluate blood flow to the kidneys. Allow one hour for this exam. Do not eat after midnight the day before and avoid carbonated beverages. Take your medications as you usually do. TO BE SCHEDULED June 2021. YOU WILL BE CONTACTED BY A SCHEDULER TO SET UP THIS APPOINTMENT.  Your physician has requested that you have a carotid duplex. This test is an ultrasound of the carotid arteries in your neck. It looks at blood flow through these arteries that supply the brain with blood. Allow one hour for this exam. There are no restrictions or special instructions. TO BE SCHEDULED June 2021. YOU WILL BE CONTACTED BY A SCHEDULER TO SET UP  THIS APPOINTMENT.   Follow-Up: At [facility], you and your health needs are our priority.  As part of our continuing mission to provide you with exceptional heart care, we have created designated Provider Care Teams.  These Care Teams include your primary Cardiologist (physician) and Advanced Practice Providers (APPs -  Physician Assistants and Nurse Practitioners) who all work together to provide you with the care you need, when you need it. . You will need a follow up appointment in 6 months with an APP and in 12 months with Dr. Gwenlyn Found.  Please call our office 2 months in advance to schedule each appointment.  You may see one of the following Advanced Practice Providers on your designated Care Team:   . Kerin Ransom, [place] . Almyra Deforest, PA-C . Fabian Sharp, PA-C . Jory Sims, DNP . Rosaria Ferries, PA-C . Roby Lofts, PA-C . Sande Rives, PA-C

## 2018-07-30 NOTE — Telephone Encounter (Signed)
Patient and/or DPR-approved person aware of 6/10 AVS instructions and verbalized understanding. Letter including After Visit Summary and any other necessary documents to be mailed to the patient's address on file.

## 2018-07-30 NOTE — Progress Notes (Signed)
Virtual Visit via Telephone Note   This visit type was conducted due to national recommendations for restrictions regarding the COVID-19 Pandemic (e.g. social distancing) in an effort to limit this patient's exposure and mitigate transmission in our community.  Due to her co-morbid illnesses, this patient is at least at moderate risk for complications without adequate follow up.  This format is felt to be most appropriate for this patient at this time.  The patient did not have access to video technology/had technical difficulties with video requiring transitioning to audio format only (telephone).  All issues noted in this document were discussed and addressed.  No physical exam could be performed with this format.  Please refer to the patient's chart for her  consent to telehealth for [name].   Date:  07/30/2018   ID:  [name], DOB Jul 09, 1950, MRN [id number]  Patient Location: Home Provider Location: Home  PCP:  [name], MD  Cardiologist:  Quay Burow, MD  Electrophysiologist:  None   Evaluation Performed:  Follow-Up Visit  Chief Complaint: Follow-up A. fib and aortic valve replacement, carotid and renal disease.  History of Present Illness:    [name] is a 68 y.o.  moderately overweight, almost divorced Caucasian female mother of 65, grandmother to 3 grandchildren who I last saw in the office  05/06/2018. She had a St. Jude AVR performed by Dr. Ceasar Mons September of 2006 because of severe aortic insufficiency. She had normal coronary arteries by cath at that time. A look at her renal arteries and demonstrated a 60% right renal artery stenosis and patent accessory left renal arteries. By duplex she does have a non-functioning left kidney. Her other problems include hypertension and hyperlipidemia. She also has a history of lupus followed by Dr. Jalene Mullet. An echo performed a year ago showed a well-functioning aortic prosthesis, and recent renal Dopplers  revealed a widely patent right renal artery. She had a small bowel obstruction and underwent surgery at [facility] for perforated small bowel. She had an exploratory laparotomy with ileostomy and a right hemicolectomy 04/12/12. She saw [name] PAC back in our office 06/12/12 and her medicines were adjusted.  She was admitted in early January for symptomatic complete heart block underwent permanent pacemaker insertion by Dr. Thompson Grayer.[name] She was remitted approximately week later with orthostatic hypotension and was dehydrated. She responded to fluid resuscitation.  Since I saw her a year ago she was admitted with heart failure requiring diuresis secondary to diastolic dysfunction and dietary indiscretion. In addition, she recently underwent an cardiac catheterization by Dr. [name] because of chest pain 04/19/15 revealing normal coronary arteries. She was admitted to [facility] of volume overload 12/21/15 and was diuresed Since I saw her a year ago she has had an echo performed 06/03/2017 that showed a slight decline in LV function with an EF of 45%, well-functioning aortic mechanical prosthesis with a peak aortic gradient of 30 mmHg.  She was complaining of increasing dyspnea.  She was found to be in A. fib and was admitted for Tikosyn load on 04/01/2018.  She converted to sinus rhythm and feels clinically improved.  Since I saw her 3 months ago she continues to do well ostensibly maintaining sinus rhythm on Tikosyn and warfarin.  She did see Dr. Rayann Heman back who confirmed continued improvement.  She does have diastolic heart failure on diuretics with grade 1 diastolic dysfunction on 2D echo 05/24/2017.  Recent carotid and renal Doppler studies showed an occluded left renal  artery with a widely patent right renal artery and an occluded left internal carotid with a widely patent right.  The patient does not have symptoms concerning for COVID-19 infection (fever, chills, cough, or new shortness of  breath).    Past Medical History:  Diagnosis Date   Antiphospholipid antibody positive 06/28/2017   Asthma    CAD (coronary artery disease)    a. normal cors by cath in 03/2015   Carotid artery disease ([place])    Chronic anticoagulation    Complete heart block ([place]) 02/2013   a. Implantation of a dual chamber pacemaker in 02/2013 by Dr Rayann Heman. STJ model number 3428 pacemaker with model number [id number] right ventricular lead.    Dyslipidemia    H/O Doppler ultrasound 10/03/2010   carotid duplex - R ICA normal patency; L CCA-ICA bypass gradt patent common to interal cartoid bypass graft    H/O mechanical aortic valve replacement    History of echocardiogram 11/08/2011   EF >55%; mild-mod concentric LVH; trace aortic regurg.; LA mild-mod dilated   History of nuclear stress test 01/20/2010   normal pattern of perfusion; low risk scan   Hypertension    Lupus (systemic lupus erythematosus) (HCC)    PVD (peripheral vascular disease) (HCC)    60% R renal artery stenosis; non-functioning L kidney   Past Surgical History:  Procedure Laterality Date   AORTIC VALVE REPLACEMENT  11/29/006   Gerhardt; St. Jude; on coumadin   CARDIAC CATHETERIZATION  11/01/2004   define anatomy    CARDIAC CATHETERIZATION N/A 04/19/2015   Procedure: Coronary Angiography;  Surgeon: Peter M [name], MD;  Location: [place] CV LAB;  Service: Cardiovascular;  Laterality: N/A;   CAROTID ENDARTERECTOMY     lt.   CHOLECYSTECTOMY  1999   EXPLORATORY LAPAROTOMY  04/12/2012   small bowel perf; ileostomy & R hemicolectomy    PACEMAKER INSERTION  03/06/2013   SJM Assurity DR PPM implanted by Dr Rayann Heman for complete heart block   PERMANENT PACEMAKER INSERTION N/A 03/06/2013   Procedure: PERMANENT PACEMAKER INSERTION;  Surgeon: Coralyn Mark, MD;  Location: [place] CATH LAB;  Service: Cardiovascular;  Laterality: N/A;   TEMPORARY PACEMAKER INSERTION N/A 03/06/2013   Procedure: TEMPORARY PACEMAKER INSERTION;   Surgeon: Coralyn Mark, MD;  Location: [place] CATH LAB;  Service: Cardiovascular;  Laterality: N/A;   TUBAL LIGATION  11/1982     No outpatient medications have been marked as taking for the 07/30/18 encounter (Appointment) with [name], MD.     Allergies:   Patient has no known allergies.   Social History   Tobacco Use   Smoking status: Never Smoker   Smokeless tobacco: Never Used  Substance Use Topics   Alcohol use: No   Drug use: No     Family Hx: The patient's family history includes Alzheimer's disease in her father; Cancer in her father; Cirrhosis in her father. There is no history of Heart attack.  ROS:   Please see the history of present illness.     All other systems reviewed and are negative.   Prior CV studies:   The following studies were reviewed today:  Renal Dopplers and carotid Dopplers performed 07/22/2018  Labs/Other Tests and Data Reviewed:    EKG:  No ECG reviewed.  Recent Labs: 09/07/2017: ALT 36 03/17/2018: Hemoglobin 11.8; Platelets 216; TSH 3.900 04/21/2018: BUN 24; Creatinine, Ser 1.41; Magnesium 2.2; Potassium 4.3; Sodium 141   Recent Lipid Panel Lab Results  Component Value Date/Time   CHOL  157 06/02/2014 10:35 AM   TRIG 78 06/02/2014 10:35 AM   HDL 101 06/02/2014 10:35 AM   CHOLHDL 2.1 09/02/2012 12:00 AM   LDLCALC 40 06/02/2014 10:35 AM    Wt Readings from Last 3 Encounters:  05/06/18 257 lb 6.4 oz (116.8 kg)  04/14/18 260 lb 12.8 oz (118.3 kg)  04/04/18 264 lb 4.8 oz (119.9 kg)     Objective:    Vital Signs:  There were no vitals taken for this visit.   VITAL SIGNS:  reviewed a complete physical exam was not performed today since this was a virtual telemedicine phone visit  ASSESSMENT & PLAN:    1. Aortic valve replacement- history of Saint Jude AVR performed by Dr. Ceasar Mons 9/06 with normal coronary arteries at that time.  She is on Coumadin anticoagulation.  Her most recent echo performed 05/24/2017 revealed an EF  of 45% with grade 1 diastolic dysfunction, and intact mechanical aortic valve with a peak gradient of 30 mmHg. 2. Mitral regurgitation- moderate MR on 2D echo performed 05/24/2017 3. Permanent transvenous pacemaker- history of permanent transvenous pacemaker inserted by Dr. Rayann Heman because of complete heart block. 4. Paroxysmal atrial fibrillation- history of symptomatic PAF converted to sinus rhythm with Tikosyn load feeling clinically improved. 5. Hyperlipidemia- on simvastatin followed by her PCP 6. Essential hypertension- on metoprolol 7. Carotid artery disease- recent Dopplers performed 07/22/2018 revealed an occluded left internal carotid artery with a widely patent right 8. Renal arteries disease- recent renal Dopplers performed 07/22/2018 revealed an occluded left renal artery with a widely patent right  COVID-19 Education: The signs and symptoms of COVID-19 were discussed with the patient and how to seek care for testing (follow up with PCP or arrange E-visit).  The importance of social distancing was discussed today.  Time:   Today, I have spent 5 minutes with the patient with telehealth technology discussing the above problems.     Medication Adjustments/Labs and Tests Ordered: Current medicines are reviewed at length with the patient today.  Concerns regarding medicines are outlined above.   Tests Ordered: No orders of the defined types were placed in this encounter.   Medication Changes: No orders of the defined types were placed in this encounter.   Disposition:  Follow up in 6 month(s)  Signed, Quay Burow, MD  07/30/2018 11:15 AM    [place]

## 2018-08-05 ENCOUNTER — Other Ambulatory Visit: Payer: Self-pay

## 2018-08-05 ENCOUNTER — Telehealth: Payer: Self-pay

## 2018-08-05 ENCOUNTER — Ambulatory Visit (HOSPITAL_COMMUNITY)
Admission: RE | Admit: 2018-08-05 | Discharge: 2018-08-05 | Disposition: A | Discharge status: Home / Self Care | Payer: Medicare Other | Source: Ambulatory Visit | Attending: Physician Assistant | Admitting: Physician Assistant

## 2018-08-05 VITALS — BP 136/82 | HR 71 | Ht 66.0 in | Wt 265.2 lb

## 2018-08-05 DIAGNOSIS — E785 Hyperlipidemia, unspecified: Secondary | ICD-10-CM | POA: Diagnosis not present

## 2018-08-05 DIAGNOSIS — Z952 Presence of prosthetic heart valve: Secondary | ICD-10-CM | POA: Insufficient documentation

## 2018-08-05 DIAGNOSIS — M329 Systemic lupus erythematosus, unspecified: Secondary | ICD-10-CM | POA: Insufficient documentation

## 2018-08-05 DIAGNOSIS — Z6841 Body Mass Index (BMI) 40.0 and over, adult: Secondary | ICD-10-CM | POA: Diagnosis not present

## 2018-08-05 DIAGNOSIS — E669 Obesity, unspecified: Secondary | ICD-10-CM | POA: Diagnosis not present

## 2018-08-05 DIAGNOSIS — I4819 Other persistent atrial fibrillation: Secondary | ICD-10-CM | POA: Diagnosis present

## 2018-08-05 DIAGNOSIS — I442 Atrioventricular block, complete: Secondary | ICD-10-CM | POA: Diagnosis not present

## 2018-08-05 DIAGNOSIS — I358 Other nonrheumatic aortic valve disorders: Secondary | ICD-10-CM | POA: Insufficient documentation

## 2018-08-05 DIAGNOSIS — Z950 Presence of cardiac pacemaker: Secondary | ICD-10-CM | POA: Insufficient documentation

## 2018-08-05 DIAGNOSIS — I2510 Atherosclerotic heart disease of native coronary artery without angina pectoris: Secondary | ICD-10-CM | POA: Diagnosis not present

## 2018-08-05 DIAGNOSIS — I10 Essential (primary) hypertension: Secondary | ICD-10-CM | POA: Insufficient documentation

## 2018-08-05 DIAGNOSIS — Z79899 Other long term (current) drug therapy: Secondary | ICD-10-CM | POA: Diagnosis not present

## 2018-08-05 DIAGNOSIS — Z7901 Long term (current) use of anticoagulants: Secondary | ICD-10-CM | POA: Insufficient documentation

## 2018-08-05 DIAGNOSIS — I739 Peripheral vascular disease, unspecified: Secondary | ICD-10-CM | POA: Diagnosis not present

## 2018-08-05 LAB — BASIC METABOLIC PANEL
Anion gap: 6 (ref 5–15)
BUN: 23 mg/dL (ref 8–23)
CO2: 26 mmol/L (ref 22–32)
Calcium: 8.9 mg/dL (ref 8.9–10.3)
Chloride: 102 mmol/L (ref 98–111)
Creatinine, Ser: 1.13 mg/dL — ABNORMAL HIGH (ref 0.44–1.00)
GFR calc Af Amer: 59 mL/min — ABNORMAL LOW (ref >60)
GFR calc non Af Amer: 51 mL/min — ABNORMAL LOW (ref >60)
Glucose, Bld: 81 mg/dL (ref 70–99)
Potassium: 4.2 mmol/L (ref 3.5–5.1)
Sodium: 134 mmol/L — ABNORMAL LOW (ref 135–145)

## 2018-08-05 LAB — MAGNESIUM: Magnesium: 2 mg/dL (ref 1.7–2.4)

## 2018-08-05 NOTE — Progress Notes (Signed)
Primary Care Physician: [name], MD Referring Physician:Dr. Jerl Santos Latino is a 68 y.o. female with a h/o PPM,CAD, mechanical  AVR on warfarin, persistent afib for the last 30 days per [facility] device alert brought to the attention of [facility], NP/ Dr. Rayann Heman. He feels that is is worth a try to try pt on tikosyn as she is having more issues with edema and shortness of breath in the last several weeks.  He is concerned with her obesity and severe TR that the results may be marginal. The pt is informed of dofetilide admission and what is to be expected with hospitalization for same. She is interested in pursing. EKG shows v paced today at 76 bpm.   F/u in afib clinic, 2/11, she is here for dofetilide admit. She plans to go thru [facility] for her med.  F/u in afib clinic, 2/24, s/p one week dofetilide load. She appears to be back in rhythm with a sensed, v paced ekg tracing. She feels improved, much less short of breath.She asks me about 4 more drugs that she takes on an intermittent basis that will be passed on to PharmD for additional screening.   F/u in AF clinic on 08/05/18. Patient reports that she is doing very well on Tikosyn. She denies any symptoms of heart racing or palpitations. She is trying to increase her activity level by walking.  Today, she denies symptoms of palpitations, chest pain, shortness of breath, orthopnea, PND, lower extremity edema, dizziness, presyncope, syncope, or neurologic sequela. The patient is tolerating medications without difficulties and is otherwise without complaint today.   Past Medical History:  Diagnosis Date   Antiphospholipid antibody positive 06/28/2017   Asthma    CAD (coronary artery disease)    a. normal cors by cath in 03/2015   Carotid artery disease ([place])    Chronic anticoagulation    Complete heart block ([place]) 02/2013   a. Implantation of a dual chamber pacemaker in 02/2013 by Dr Rayann Heman. STJ model number 1287 pacemaker  with model number [id number] right ventricular lead.    Dyslipidemia    H/O Doppler ultrasound 10/03/2010   carotid duplex - R ICA normal patency; L CCA-ICA bypass gradt patent common to interal cartoid bypass graft    H/O mechanical aortic valve replacement    History of echocardiogram 11/08/2011   EF >55%; mild-mod concentric LVH; trace aortic regurg.; LA mild-mod dilated   History of nuclear stress test 01/20/2010   normal pattern of perfusion; low risk scan   Hypertension    Lupus (systemic lupus erythematosus) (HCC)    PVD (peripheral vascular disease) (HCC)    60% R renal artery stenosis; non-functioning L kidney   Past Surgical History:  Procedure Laterality Date   AORTIC VALVE REPLACEMENT  11/29/006   Gerhardt; St. Jude; on coumadin   CARDIAC CATHETERIZATION  11/01/2004   define anatomy    CARDIAC CATHETERIZATION N/A 04/19/2015   Procedure: Coronary Angiography;  Surgeon: Peter M [name], MD;  Location: [place] CV LAB;  Service: Cardiovascular;  Laterality: N/A;   CAROTID ENDARTERECTOMY     lt.   CHOLECYSTECTOMY  1999   EXPLORATORY LAPAROTOMY  04/12/2012   small bowel perf; ileostomy & R hemicolectomy    PACEMAKER INSERTION  03/06/2013   SJM Assurity DR PPM implanted by Dr Rayann Heman for complete heart block   PERMANENT PACEMAKER INSERTION N/A 03/06/2013   Procedure: PERMANENT PACEMAKER INSERTION;  Surgeon: Coralyn Mark, MD;  Location: [facility] CATH  LAB;  Service: Cardiovascular;  Laterality: N/A;   TEMPORARY PACEMAKER INSERTION N/A 03/06/2013   Procedure: TEMPORARY PACEMAKER INSERTION;  Surgeon: Coralyn Mark, MD;  Location: [place] CATH LAB;  Service: Cardiovascular;  Laterality: N/A;   TUBAL LIGATION  11/1982    Current Outpatient Medications  Medication Sig Dispense Refill   acetaminophen (TYLENOL) 325 MG tablet Take 650 mg by mouth every 6 (six) hours as needed for moderate pain.      albuterol (PROVENTIL HFA;VENTOLIN HFA) 108 (90 Base) MCG/ACT inhaler Inhale 2 puffs  into the lungs every 6 (six) hours as needed for wheezing or shortness of breath. 3 Inhaler 1   allopurinol (ZYLOPRIM) 100 MG tablet Take 100 mg by mouth daily.      Calcium Carbonate (CALTRATE 600) 1500 MG TABS Take 1 tablet by mouth 2 (two) times daily.       cetirizine (ZYRTEC) 10 MG tablet Take 10 mg by mouth at bedtime.     colestipol (COLESTID) 1 g tablet Take 1 g by mouth daily.     dofetilide (TIKOSYN) 250 MCG capsule Take 1 capsule (250 mcg total) by mouth 2 (two) times daily. 60 capsule 6   Fluticasone-Salmeterol (WIXELA INHUB) 250-50 MCG/DOSE AEPB Inhale 1 puff into the lungs daily.     furosemide (LASIX) 20 MG tablet Take 1 tablet (20 mg total) by mouth daily. 30 tablet 6   hydroxychloroquine (PLAQUENIL) 200 MG tablet Take 200 mg by mouth daily.     isosorbide mononitrate (IMDUR) 30 MG 24 hr tablet TAKE 1 TABLET BY MOUTH  DAILY (Patient taking differently: Take 30 mg by mouth daily. ) 90 tablet 3   leflunomide (ARAVA) 10 MG tablet Take 10 mg by mouth daily.     loratadine (CLARITIN) 10 MG tablet Take 10 mg by mouth daily.     Magnesium 200 MG TABS Take 1 tablet (200 mg total) by mouth daily. 30 each    metoprolol succinate (TOPROL-XL) 50 MG 24 hr tablet TAKE 1 TABLET BY MOUTH  DAILY WITH OR IMMEDIATELY  FOLLOWING A MEAL (Patient taking differently: Take 50 mg by mouth daily. ) 90 tablet 3   potassium chloride SA (K-DUR,KLOR-CON) 20 MEQ tablet Take 1 tablet (20 mEq total) by mouth 2 (two) times daily. 60 tablet 3   predniSONE (DELTASONE) 5 MG tablet Take 5 mg by mouth daily.       Probiotic Product (PROBIOTIC DAILY PO) Take 1 tablet by mouth daily.     simvastatin (ZOCOR) 20 MG tablet Take 1 tablet by mouth at  bedtime (Patient taking differently: Take 20 mg by mouth at bedtime. ) 90 tablet 0   warfarin (COUMADIN) 5 MG tablet TAKE 1 TO 1 AND 1/2 TABLETS BY MOUTH DAILY AS DIRECTED  BY COUMADIN CLINIC (Patient taking differently: Take 5-7.5 mg by mouth See admin  instructions. Take 1 tablet (5mg ) on Mon, Wed, Fri, and Sat. Take 1 1/2 tablet (7.5mg ) all other days) 135 tablet 1   chlorhexidine (PERIDEX) 0.12 % solution Use as directed 10 mLs in the mouth or throat 2 (two) times daily as needed (before dental appts).   5   cholecalciferol (VITAMIN D) 1000 UNITS tablet Take 2,000 Units by mouth 2 (two) times daily.      No current facility-administered medications for this encounter.     No Known Allergies  Social History   Socioeconomic History   Marital status: Divorced    Spouse name: Not on file   Number of children: 3  Years of education: Not on file   Highest education level: Not on file  Occupational History   Occupation: Retired  Scientist, product/process development strain: Not on file   Food insecurity    Worry: Not on file    Inability: Not on Lexicographer needs    Medical: Not on file    Non-medical: Not on file  Tobacco Use   Smoking status: Never Smoker   Smokeless tobacco: Never Used  Substance and Sexual Activity   Alcohol use: No   Drug use: No   Sexual activity: Not on file  Lifestyle   Physical activity    Days per week: Not on file    Minutes per session: Not on file   Stress: Not on file  Relationships   Social connections    Talks on phone: Not on file    Gets together: Not on file    Attends religious service: Not on file    Active member of club or organization: Not on file    Attends meetings of clubs or organizations: Not on file    Relationship status: Not on file   Intimate partner violence    Fear of current or ex partner: Not on file    Emotionally abused: Not on file    Physically abused: Not on file    Forced sexual activity: Not on file  Other Topics Concern   Not on file  Social History Narrative   Lives alone in [place].    Family History  Problem Relation Age of Onset   Alzheimer's disease Father    Cirrhosis Father    Cancer Father         prostate   Heart attack Neg Hx     ROS- All systems are reviewed and negative except as per the HPI above  Physical Exam: Vitals:   08/05/18 1015  BP: 136/82  Pulse: 71  Weight: 120.3 kg  Height: 5\' 6"  (1.676 m)   Wt Readings from Last 3 Encounters:  08/05/18 120.3 kg  07/30/18 116.6 kg  05/06/18 116.8 kg    Labs: Lab Results  Component Value Date   NA 134 (L) 08/05/2018   K 4.2 08/05/2018   CL 102 08/05/2018   CO2 26 08/05/2018   GLUCOSE 81 08/05/2018   BUN 23 08/05/2018   CREATININE 1.13 (H) 08/05/2018   CALCIUM 8.9 08/05/2018   MG 2.0 08/05/2018   Lab Results  Component Value Date   INR 2.6 07/15/2018   Lab Results  Component Value Date   CHOL 157 06/02/2014   HDL 101 06/02/2014   LDLCALC 40 06/02/2014   TRIG 78 06/02/2014    GEN- The patient is well appearing obese female, alert and oriented x 3 today.   HEENT-head normocephalic, atraumatic, sclera clear, conjunctiva pink, hearing intact, trachea midline. Lungs- Clear to ausculation bilaterally, normal work of breathing Heart- Regular rate and rhythm, no murmurs, rubs or gallops  GI- soft, NT, ND, + BS Extremities- no clubbing, cyanosis, or edema MS- no significant deformity or atrophy Skin- no rash or lesion Psych- euthymic mood, full affect Neuro- strength and sensation are intact   EKG- A sensed V paced with 1st degree AV block. HR 71, PR 210, QRS 200, QTc 530  Echo-Study Conclusions  - Left ventricle: The cavity size was mildly dilated. Wall   thickness was increased in a pattern of mild LVH. Marked   septal-lateral dyssynchrony with septal hypokinesis. The  estimated ejection fraction was 45%. Doppler parameters are   consistent with abnormal left ventricular relaxation (grade 1   diastolic dysfunction). - Aortic valve: Mechanical aortic valve. Mean gradient not   significantly elevated across the aortic valve. There was trivial   regurgitation. Mean gradient (S): 16 mm Hg. - Mitral  valve: Mildly calcified annulus. There was moderate   regurgitation. - Left atrium: The atrium was mildly dilated. - Right ventricle: The cavity size was moderately dilated. Pacer   wire or catheter noted in right ventricle. Systolic function was   normal. - Right atrium: The atrium was moderately dilated. - Tricuspid valve: There was severe regurgitation with incomplete   coaptation of the tricuspid leaflets. Peak RV-RA gradient (S): 27   mm Hg. - Pulmonary arteries: PA peak pressure: 42 mm Hg (S). - Systemic veins: IVC measured 2.8 cm with < 50% respirophasic   variation, suggesting RA pressure 15 mmHg.  Impressions:  - Mildly dilated LV with EF 45%. Septal-lateral dyssynchrony with   septal hypokinesis. Moderately dilated RV with normal systolic   function. Moderate mitral regurgitation. Severe tricuspid   regurgitation with incomplete coaptation of tricuspid leaflets.   Mechanical aortic valve appeared to function normally. Mild   pulmonary hypertension.   Assessment and Plan: 1. Persistent  afib  Patient appears to be maintaining SR. Continue dofetilide 250 mcg BID, QT stable. Reminded of Tikosyn precautions Bmet/mag today Continue warfarin  2. Complete heart block S/p PPM, followed by Dr Rayann Heman and device clinic.  3. Aortic valve disease S/p AVR with mechanical valve. Followed by Dr Gwenlyn Found  4. HTN Stable, no changes today.  5. Obesity Body mass index is 42.8 kg/m. Lifestyle modification was discussed and encouraged including regular physical activity and weight reduction.   F/u in AF clinic in 3 months.   [facility] [address] [place], [place] [address] [phone]

## 2018-08-05 NOTE — Telephone Encounter (Signed)

## 2018-08-06 ENCOUNTER — Encounter (HOSPITAL_COMMUNITY): Payer: Medicare Other | Admitting: Physician Assistant

## 2018-08-13 ENCOUNTER — Telehealth (HOSPITAL_COMMUNITY): Payer: Self-pay | Admitting: *Deleted

## 2018-08-13 LAB — POCT INR: INR: 3.5 — AB (ref 2.0–3.0)

## 2018-08-13 NOTE — Telephone Encounter (Signed)
COVID-19 Pre-Screening Questions:  . Do you currently have a fever?No (yes = cancel and refer to pcp for e-visit) . Have you recently travelled on a cruise, internationally, or to [place], [place], [place], [place], [place], or [place], [place] [employer]) ?NO  (yes = cancel, stay home, monitor symptoms, and contact pcp or initiate e-visit if symptoms develop) . Have you been in contact with someone that is currently pending confirmation of Covid19 testing or has been confirmed to have the [place] virus?NO  (yes = cancel, stay home, away from tested individual, monitor symptoms, and contact pcp or initiate e-visit if symptoms develop) . Are you currently experiencing fatigue or cough? NO (yes = pt should be prepared to have a mask placed at the time of their visit). . Reiterated no additional visitors. Eartha Inch no earlier than 15 minutes before appointment time. . Please bring own mask.

## 2018-08-14 ENCOUNTER — Ambulatory Visit (INDEPENDENT_AMBULATORY_CARE_PROVIDER_SITE_OTHER): Payer: Medicare Other | Admitting: Pharmacist

## 2018-08-14 ENCOUNTER — Other Ambulatory Visit: Payer: Self-pay

## 2018-08-14 ENCOUNTER — Ambulatory Visit (HOSPITAL_COMMUNITY): Payer: Medicare Other | Attending: Cardiology

## 2018-08-14 DIAGNOSIS — Z5181 Encounter for therapeutic drug level monitoring: Secondary | ICD-10-CM | POA: Diagnosis not present

## 2018-08-14 DIAGNOSIS — Z952 Presence of prosthetic heart valve: Secondary | ICD-10-CM | POA: Diagnosis present

## 2018-08-19 ENCOUNTER — Other Ambulatory Visit: Payer: Self-pay

## 2018-08-19 DIAGNOSIS — Z952 Presence of prosthetic heart valve: Secondary | ICD-10-CM

## 2018-08-19 NOTE — Progress Notes (Signed)
Notes recorded by [name], MD on 08/15/2018 at 7:07 AM EDT  No change in 2D since last echo 2019. Repeat 12 months  Z95.2 Status Post AVR

## 2018-08-27 LAB — POCT INR: INR: 3.3 — AB (ref 2.0–3.0)

## 2018-08-28 ENCOUNTER — Ambulatory Visit (INDEPENDENT_AMBULATORY_CARE_PROVIDER_SITE_OTHER): Payer: Medicare Other | Admitting: Cardiology

## 2018-08-28 DIAGNOSIS — Z952 Presence of prosthetic heart valve: Secondary | ICD-10-CM

## 2018-08-28 DIAGNOSIS — Z5181 Encounter for therapeutic drug level monitoring: Secondary | ICD-10-CM | POA: Diagnosis not present

## 2018-08-28 NOTE — Patient Instructions (Signed)
Description   Spoke with pt and instructed pt continue taking 1 tablet daily except 1.5 tablets on Thursdays. Recheck in 2 weeks.

## 2018-09-08 ENCOUNTER — Ambulatory Visit (INDEPENDENT_AMBULATORY_CARE_PROVIDER_SITE_OTHER): Payer: Medicare Other

## 2018-09-08 ENCOUNTER — Encounter: Payer: Self-pay | Admitting: Internal Medicine

## 2018-09-08 ENCOUNTER — Ambulatory Visit: Payer: Medicare Other | Admitting: Internal Medicine

## 2018-09-08 ENCOUNTER — Other Ambulatory Visit: Payer: Self-pay

## 2018-09-08 VITALS — BP 124/80 | HR 77 | Temp 97.6°F | Ht 65.0 in | Wt 268.1 lb

## 2018-09-08 DIAGNOSIS — J4530 Mild persistent asthma, uncomplicated: Secondary | ICD-10-CM | POA: Diagnosis not present

## 2018-09-08 DIAGNOSIS — J4520 Mild intermittent asthma, uncomplicated: Secondary | ICD-10-CM | POA: Diagnosis not present

## 2018-09-08 DIAGNOSIS — I509 Heart failure, unspecified: Secondary | ICD-10-CM

## 2018-09-08 MED ORDER — FLUTICASONE-SALMETEROL 250-50 MCG/DOSE IN AEPB: 1.0000 | INHALATION_SPRAY | Freq: Every day | RESPIRATORY_TRACT | 3 refills | Status: DC

## 2018-09-08 NOTE — Assessment & Plan Note (Signed)
Continues as mild persistent uncomplicated. [place] working well for her. Meds discussed. Covid precautions reviewed.

## 2018-09-08 NOTE — Patient Instructions (Signed)
Order- CXR dx Asthma mild persistent, CHF  Script sent refilling Wixela [phone]   Please call if we can help

## 2018-09-08 NOTE — Progress Notes (Signed)
Subjective:    Patient ID: [name], female    DOB: 1950-06-24, 68 y.o.   MRN: [id number]  HPI female never smoker followed for asthma complicated by history of lupus, aortic valve replacement/ pacemaker/ warfarin. Office spirometry 04/22/15- -------------------------------------------------------------------------------  02/15/14- 68 year old female never smoker followed for asthma complicated by history of lupus, aortic valve replacement/ pacemaker/ warfarin. FOLLOWS FOR: Cough has returned since last visit with TP; SOB as well. Has been going on for about 1 week now. Denies any fever or chills.  Cough cleared after last visit but recurred starting about a week ago with no obvious trigger. Increased dyspnea on exertion past 2 or 3 weeks. She has missed Lasix only in the last 2 days. CXR 11/25/13 IMPRESSION: 1. Stable chest. No acute cardiopulmonary disease. 2. Median sternotomy and cardiac valve replacement. Cardiac pacer in stable position. Stable cardiomegaly, no congestive heart failure. Electronically Signed  By: Marcello Moores Register  On: 11/25/2013 14:51  09/08/2018- 68 year old female never smoker followed for asthma complicated by history of lupus, aortic valve replacement/ pacemaker/ warfarin. ------pt states her breathing is as basline, uses Wixela once daily & twice daily with increased SOB Prednisone 5 mg daily (for lupus), Wixela 250, albuterol hfa Satisfied with control. Discussed Wixela- asks refill. No concerns. Indicates cardiac status under control. CXR 12/21/15-  IMPRESSION: 1. Stable moderate cardiomegaly with mild central congestion. Mild perihilar interstitial opacities suggest mild edema. 2. No focal consolidation or effusion.  Review of Systems- see HPI    + = positive Constitutional:   No-   weight loss, night sweats, fevers, chills, fatigue, lassitude. HEENT:   No-  headaches, difficulty swallowing, tooth/dental problems, sore throat,       No-  sneezing,  itching, +ear pressure, + controlled nasal congestion, post nasal drip,  CV:  No-   chest pain, orthopnea, PND, swelling in lower extremities, anasarca,dizziness, palpitations Resp: +shortness of breath with exertion or at rest.              No-   productive cough, + non-productive cough,  No-  coughing up of blood.              No-   change in color of mucus.  No- wheezing.   Skin: No-   rash or lesions. GI:  No-   heartburn, indigestion, abdominal pain, nausea, vomiting,  GU:  MS:  No-   joint pain or swelling.   Neuro- nothing unusual  Psych:  No- change in mood or affect. No depression or anxiety.  No memory loss.  Objective:   Physical Exam General- Alert, Oriented, Affect-appropriate, Distress- none acute;  +obese Skin- rash-none, lesions- none, excoriation- none Lymphadenopathy- none Head- atraumatic            Eyes- Gross vision intact, PERRLA, conjunctivae clear secretions            Ears- Hearing, canals normal            Nose- Clear, no-Septal dev, mucus, polyps, erosion, perforation             Throat- Mallampati III , mucosa clear , drainage- none, tonsils- atrophic Neck- flexible , trachea midline, no stridor , thyroid nl, carotid no bruit Chest - symmetrical excursion , unlabored           Heart/CV- RRR , +crisp valve sounds,  1/6 Systolic AS  Murmur, no gallop  , no rub, nl s1 s2                           -  JVD+1 , edema+2 in feet, stasis changes- none, varices- none           Lung- wheeze+ trace LUL, cough- none , dullness-none, rub- none, rales-none           Chest wall- L pacer Abd-  Br/ Gen/ Rectal- Not done, not indicated Extrem- cyanosis- none, clubbing, none, atrophy- none, strength- nl,        + superficial varices Neuro- grossly intact to observation  Assessment & Plan:

## 2018-09-08 NOTE — Assessment & Plan Note (Signed)
Followed by cardiology. Agrees to f/u CXR since last one showed CHF

## 2018-09-10 ENCOUNTER — Telehealth: Payer: Self-pay | Admitting: Internal Medicine

## 2018-09-10 NOTE — Telephone Encounter (Signed)
Pt has been made aware of results of cxr. Documented in results. Nothing further needed.

## 2018-09-12 ENCOUNTER — Ambulatory Visit (INDEPENDENT_AMBULATORY_CARE_PROVIDER_SITE_OTHER): Payer: Medicare Other | Admitting: Pharmacist

## 2018-09-12 DIAGNOSIS — Z952 Presence of prosthetic heart valve: Secondary | ICD-10-CM

## 2018-09-12 DIAGNOSIS — Z5181 Encounter for therapeutic drug level monitoring: Secondary | ICD-10-CM | POA: Diagnosis not present

## 2018-09-12 LAB — POCT INR: INR: 3.5 — AB (ref 2.0–3.0)

## 2018-09-17 ENCOUNTER — Other Ambulatory Visit: Payer: Self-pay | Admitting: Cardiovascular Disease

## 2018-09-17 NOTE — Telephone Encounter (Signed)
Please review for refill. Thanks!  

## 2018-09-26 LAB — POCT INR: INR: 3.1 — AB (ref 2.0–3.0)

## 2018-10-07 ENCOUNTER — Ambulatory Visit (INDEPENDENT_AMBULATORY_CARE_PROVIDER_SITE_OTHER): Payer: Medicare Other | Admitting: Pharmacist Clinician (PhC)/ Clinical Pharmacy Specialist

## 2018-10-07 DIAGNOSIS — I480 Paroxysmal atrial fibrillation: Secondary | ICD-10-CM

## 2018-10-07 DIAGNOSIS — Z5181 Encounter for therapeutic drug level monitoring: Secondary | ICD-10-CM | POA: Diagnosis not present

## 2018-10-07 DIAGNOSIS — Z952 Presence of prosthetic heart valve: Secondary | ICD-10-CM

## 2018-10-15 ENCOUNTER — Ambulatory Visit (INDEPENDENT_AMBULATORY_CARE_PROVIDER_SITE_OTHER): Payer: Medicare Other | Admitting: Cardiovascular Disease

## 2018-10-15 ENCOUNTER — Ambulatory Visit (INDEPENDENT_AMBULATORY_CARE_PROVIDER_SITE_OTHER): Payer: Medicare Other | Admitting: *Deleted

## 2018-10-15 DIAGNOSIS — Z5181 Encounter for therapeutic drug level monitoring: Secondary | ICD-10-CM | POA: Diagnosis not present

## 2018-10-15 DIAGNOSIS — Z952 Presence of prosthetic heart valve: Secondary | ICD-10-CM

## 2018-10-15 DIAGNOSIS — I442 Atrioventricular block, complete: Secondary | ICD-10-CM

## 2018-10-15 LAB — CUP PACEART REMOTE DEVICE CHECK
Battery Remaining Longevity: 130 mo
Battery Remaining Percentage: 95.5 %
Battery Voltage: 2.98 V
Brady Statistic AP VP Percent: 17 %
Brady Statistic AP VS Percent: 1 %
Brady Statistic AS VP Percent: 83 %
Brady Statistic AS VS Percent: 1 %
Brady Statistic RA Percent Paced: 15 %
Brady Statistic RV Percent Paced: 99 %
Date Time Interrogation Session: 20200825060016
Implantable Lead Implant Date: 20150116
Implantable Lead Implant Date: 20150116
Implantable Lead Location: 753859
Implantable Lead Location: 753860
Implantable Lead Model: 5076
Implantable Pulse Generator Implant Date: 20150116
Lead Channel Impedance Value: 400 Ohm
Lead Channel Impedance Value: 630 Ohm
Lead Channel Pacing Threshold Amplitude: 0.625 V
Lead Channel Pacing Threshold Amplitude: 0.75 V
Lead Channel Pacing Threshold Pulse Width: 0.4 ms
Lead Channel Pacing Threshold Pulse Width: 0.4 ms
Lead Channel Sensing Intrinsic Amplitude: 12 mV
Lead Channel Sensing Intrinsic Amplitude: 5 mV
Lead Channel Setting Pacing Amplitude: 0.875
Lead Channel Setting Pacing Amplitude: 2 V
Lead Channel Setting Pacing Pulse Width: 0.4 ms
Lead Channel Setting Sensing Sensitivity: 2 mV
Pulse Gen Model: 2240
Pulse Gen Serial Number: 7587004

## 2018-10-15 LAB — POCT INR: INR: 3.2 — AB (ref 2.0–3.0)

## 2018-10-23 ENCOUNTER — Encounter: Payer: Self-pay | Admitting: Cardiology

## 2018-10-23 NOTE — Progress Notes (Signed)
Remote pacemaker transmission.   

## 2018-10-30 ENCOUNTER — Ambulatory Visit (INDEPENDENT_AMBULATORY_CARE_PROVIDER_SITE_OTHER): Payer: Medicare Other | Admitting: Pharmacist

## 2018-10-30 DIAGNOSIS — Z5181 Encounter for therapeutic drug level monitoring: Secondary | ICD-10-CM

## 2018-10-30 DIAGNOSIS — Z7901 Long term (current) use of anticoagulants: Secondary | ICD-10-CM | POA: Diagnosis not present

## 2018-10-30 DIAGNOSIS — Z952 Presence of prosthetic heart valve: Secondary | ICD-10-CM

## 2018-10-30 LAB — POCT INR: INR: 3.6 — AB (ref 2.0–3.0)

## 2018-11-06 ENCOUNTER — Other Ambulatory Visit (HOSPITAL_COMMUNITY): Payer: Self-pay | Admitting: *Deleted

## 2018-11-06 ENCOUNTER — Other Ambulatory Visit: Payer: Self-pay | Admitting: Physician Assistant

## 2018-11-06 MED ORDER — DOFETILIDE 250 MCG PO CAPS: 250.0000 ug | ORAL_CAPSULE | Freq: Two times a day (BID) | ORAL | 0 refills | Status: DC

## 2018-11-07 NOTE — Telephone Encounter (Signed)
This is a A-Fib clinic pt 

## 2018-11-13 LAB — POCT INR: INR: 5.5 — AB (ref 2.0–3.0)

## 2018-11-14 ENCOUNTER — Ambulatory Visit (INDEPENDENT_AMBULATORY_CARE_PROVIDER_SITE_OTHER): Payer: Medicare Other | Admitting: Pharmacist

## 2018-11-14 DIAGNOSIS — Z952 Presence of prosthetic heart valve: Secondary | ICD-10-CM

## 2018-11-14 DIAGNOSIS — Z5181 Encounter for therapeutic drug level monitoring: Secondary | ICD-10-CM

## 2018-11-20 ENCOUNTER — Ambulatory Visit (INDEPENDENT_AMBULATORY_CARE_PROVIDER_SITE_OTHER): Payer: Medicare Other | Admitting: Cardiology

## 2018-11-20 ENCOUNTER — Other Ambulatory Visit: Payer: Self-pay | Admitting: Cardiovascular Disease

## 2018-11-20 DIAGNOSIS — Z5181 Encounter for therapeutic drug level monitoring: Secondary | ICD-10-CM | POA: Diagnosis not present

## 2018-11-20 DIAGNOSIS — Z952 Presence of prosthetic heart valve: Secondary | ICD-10-CM

## 2018-11-20 DIAGNOSIS — I4729 Other ventricular tachycardia: Secondary | ICD-10-CM

## 2018-11-20 DIAGNOSIS — I472 Ventricular tachycardia: Secondary | ICD-10-CM

## 2018-11-20 LAB — POCT INR: INR: 3.7 — AB (ref 2.0–3.0)

## 2018-12-01 ENCOUNTER — Other Ambulatory Visit: Payer: Self-pay | Admitting: Otolaryngology

## 2018-12-01 ENCOUNTER — Other Ambulatory Visit (HOSPITAL_COMMUNITY): Payer: Self-pay | Admitting: Nurse Practitioner

## 2018-12-03 ENCOUNTER — Other Ambulatory Visit: Payer: Self-pay

## 2018-12-03 ENCOUNTER — Ambulatory Visit (HOSPITAL_COMMUNITY)
Admission: RE | Admit: 2018-12-03 | Discharge: 2018-12-03 | Disposition: A | Discharge status: Home / Self Care | Payer: Medicare Other | Source: Ambulatory Visit | Attending: Physician Assistant | Admitting: Physician Assistant

## 2018-12-03 VITALS — BP 138/74 | HR 67 | Ht 65.0 in | Wt 267.4 lb

## 2018-12-03 DIAGNOSIS — Z6841 Body Mass Index (BMI) 40.0 and over, adult: Secondary | ICD-10-CM | POA: Insufficient documentation

## 2018-12-03 DIAGNOSIS — Z950 Presence of cardiac pacemaker: Secondary | ICD-10-CM | POA: Insufficient documentation

## 2018-12-03 DIAGNOSIS — I4819 Other persistent atrial fibrillation: Secondary | ICD-10-CM | POA: Insufficient documentation

## 2018-12-03 DIAGNOSIS — I2510 Atherosclerotic heart disease of native coronary artery without angina pectoris: Secondary | ICD-10-CM | POA: Insufficient documentation

## 2018-12-03 DIAGNOSIS — I10 Essential (primary) hypertension: Secondary | ICD-10-CM | POA: Insufficient documentation

## 2018-12-03 DIAGNOSIS — E785 Hyperlipidemia, unspecified: Secondary | ICD-10-CM | POA: Insufficient documentation

## 2018-12-03 DIAGNOSIS — Z7901 Long term (current) use of anticoagulants: Secondary | ICD-10-CM | POA: Diagnosis not present

## 2018-12-03 DIAGNOSIS — E669 Obesity, unspecified: Secondary | ICD-10-CM | POA: Diagnosis not present

## 2018-12-03 DIAGNOSIS — Z952 Presence of prosthetic heart valve: Secondary | ICD-10-CM | POA: Insufficient documentation

## 2018-12-03 DIAGNOSIS — Z79899 Other long term (current) drug therapy: Secondary | ICD-10-CM | POA: Insufficient documentation

## 2018-12-03 DIAGNOSIS — M329 Systemic lupus erythematosus, unspecified: Secondary | ICD-10-CM | POA: Insufficient documentation

## 2018-12-03 DIAGNOSIS — J45909 Unspecified asthma, uncomplicated: Secondary | ICD-10-CM | POA: Insufficient documentation

## 2018-12-03 DIAGNOSIS — I739 Peripheral vascular disease, unspecified: Secondary | ICD-10-CM | POA: Insufficient documentation

## 2018-12-03 DIAGNOSIS — I359 Nonrheumatic aortic valve disorder, unspecified: Secondary | ICD-10-CM | POA: Insufficient documentation

## 2018-12-03 DIAGNOSIS — I442 Atrioventricular block, complete: Secondary | ICD-10-CM | POA: Diagnosis not present

## 2018-12-03 LAB — BASIC METABOLIC PANEL
Anion gap: 10 (ref 5–15)
BUN: 25 mg/dL — ABNORMAL HIGH (ref 8–23)
CO2: 26 mmol/L (ref 22–32)
Calcium: 9.1 mg/dL (ref 8.9–10.3)
Chloride: 103 mmol/L (ref 98–111)
Creatinine, Ser: 1.37 mg/dL — ABNORMAL HIGH (ref 0.44–1.00)
GFR calc Af Amer: 46 mL/min — ABNORMAL LOW (ref >60)
GFR calc non Af Amer: 40 mL/min — ABNORMAL LOW (ref >60)
Glucose, Bld: 101 mg/dL — ABNORMAL HIGH (ref 70–99)
Potassium: 4.7 mmol/L (ref 3.5–5.1)
Sodium: 139 mmol/L (ref 135–145)

## 2018-12-03 LAB — MAGNESIUM: Magnesium: 2 mg/dL (ref 1.7–2.4)

## 2018-12-03 MED ORDER — DOFETILIDE 250 MCG PO CAPS: 250.0000 ug | ORAL_CAPSULE | Freq: Two times a day (BID) | ORAL | 6 refills | Status: DC

## 2018-12-03 NOTE — Progress Notes (Signed)
Primary Care Physician: [name], MD Primary Cardiologist: Dr Gwenlyn Found Primary EP: Dr Rayann Heman Referring Physician:Dr. Rayann Heman   [name] is a 68 y.o. female with a h/o PPM,CAD, mechanical  AVR on warfarin, persistent afib for the last 30 days per [facility] device alert brought to the attention of [facility], NP/ Dr. Rayann Heman. He feels that is is worth a try to try pt on tikosyn as she is having more issues with edema and shortness of breath in the last several weeks.  He is concerned with her obesity and severe TR that the results may be marginal. The pt is informed of dofetilide admission and what is to be expected with hospitalization for same. She is interested in pursing. EKG shows v paced today at 76 bpm.   F/u in afib clinic, 2/11, she is here for dofetilide admit. She plans to go thru [facility] for her med.  F/u in afib clinic, 2/24, s/p one week dofetilide load. She appears to be back in rhythm with a sensed, v paced ekg tracing. She feels improved, much less short of breath.She asks me about 4 more drugs that she takes on an intermittent basis that will be passed on to PharmD for additional screening.   F/u in AF clinic on 08/05/18. Patient reports that she is doing very well on Tikosyn. She denies any symptoms of heart racing or palpitations. She is trying to increase her activity level by walking.  Follow up in AF clinic 12/03/18. Patient reports doing well since her last visit. She did have one brief episode of palpitations two days ago when she laid down at night but this quickly resolved.   Today, she denies symptoms of chest pain, shortness of breath, orthopnea, PND, lower extremity edema, dizziness, presyncope, syncope, or neurologic sequela. The patient is tolerating medications without difficulties and is otherwise without complaint today.   Past Medical History:  Diagnosis Date   Antiphospholipid antibody positive 06/28/2017   Asthma    CAD (coronary artery disease)    a. normal cors by cath in 03/2015   Carotid artery disease ([place])    Chronic anticoagulation    Complete heart block ([place]) 02/2013   a. Implantation of a dual chamber pacemaker in 02/2013 by Dr Rayann Heman. STJ model number [id number] pacemaker with model number [id number] right ventricular lead.    Dyslipidemia    H/O Doppler ultrasound 10/03/2010   carotid duplex - R ICA normal patency; L CCA-ICA bypass gradt patent common to interal cartoid bypass graft    H/O mechanical aortic valve replacement    History of echocardiogram 11/08/2011   EF >55%; mild-mod concentric LVH; trace aortic regurg.; LA mild-mod dilated   History of nuclear stress test 01/20/2010   normal pattern of perfusion; low risk scan   Hypertension    Lupus (systemic lupus erythematosus) (H[facility])    PVD (peripheral vascular disease) ([facility])    60% R renal artery stenosis; non-functioning L kidney   Past Surgical History:  Procedure Laterality Date   AORTIC VALVE REPLACEMENT  11/29/006   [place]; [employer]; on coumadin   CARDIAC CATHETERIZATION  11/01/2004   define anatomy    CARDIAC CATHETERIZATION N/A 04/19/2015   Procedure: Coronary Angiography;  Surgeon: Peter M [name], MD;  Location: [facility];  Service: Cardiovascular;  Laterality: N/A;   CAROTID ENDARTERECTOMY     lt.   CHOLECYSTECTOMY  1999   EXPLORATORY LAPAROTOMY  04/12/2012   small bowel perf; ileostomy & R hemicolectomy  PACEMAKER INSERTION  03/06/2013   SJM Assurity DR PPM implanted by Dr Rayann Heman for complete heart block   PERMANENT PACEMAKER INSERTION N/A 03/06/2013   Procedure: PERMANENT PACEMAKER INSERTION;  Surgeon: Coralyn Mark, MD;  Location: [facility];  Service: Cardiovascular;  Laterality: N/A;   TEMPORARY PACEMAKER INSERTION N/A 03/06/2013   Procedure: TEMPORARY PACEMAKER INSERTION;  Surgeon: Coralyn Mark, MD;  Location: [facility];  Service: Cardiovascular;  Laterality: N/A;   TUBAL LIGATION  11/1982    Current Outpatient  Medications  Medication Sig Dispense Refill   acetaminophen (TYLENOL) 325 MG tablet Take 650 mg by mouth every 6 (six) hours as needed for moderate pain.      albuterol (PROVENTIL HFA;VENTOLIN HFA) 108 (90 Base) MCG/ACT inhaler Inhale 2 puffs into the lungs every 6 (six) hours as needed for wheezing or shortness of breath. 3 Inhaler 1   allopurinol (ZYLOPRIM) 100 MG tablet Take 100 mg by mouth daily.      amoxicillin (AMOXIL) 500 MG capsule TK 4 CS PO ONE HOUR BEFORE APPT     Calcium Carbonate (CALTRATE 600) 1500 MG TABS Take 1 tablet by mouth 2 (two) times daily.       cetirizine (ZYRTEC) 10 MG tablet Take 10 mg by mouth at bedtime.     chlorhexidine (PERIDEX) 0.12 % solution Use as directed 10 mLs in the mouth or throat 2 (two) times daily as needed (before dental appts).   5   cholecalciferol (VITAMIN D) 1000 UNITS tablet Take 2,000 Units by mouth 2 (two) times daily.      clotrimazole-betamethasone (LOTRISONE) cream as needed.     colestipol (COLESTID) 1 g tablet Take 1 g by mouth daily.     dofetilide (TIKOSYN) 250 MCG capsule Take 1 capsule (250 mcg total) by mouth 2 (two) times daily. appt required refills [id number] 60 capsule 0   Fluticasone-Salmeterol (WIXELA INHUB) 250-50 MCG/DOSE AEPB Inhale 1 puff into the lungs daily. 180 each 3   furosemide (LASIX) 20 MG tablet Take 1 tablet (20 mg total) by mouth daily. 30 tablet 6   hydroxychloroquine (PLAQUENIL) 200 MG tablet Take 200 mg by mouth daily.     isosorbide mononitrate (IMDUR) 30 MG 24 hr tablet TAKE 1 TABLET BY MOUTH  DAILY 90 tablet 3   leflunomide (ARAVA) 10 MG tablet Take 10 mg by mouth daily.     loratadine (CLARITIN) 10 MG tablet Take 10 mg by mouth daily.     Magnesium 200 MG TABS Take 1 tablet (200 mg total) by mouth daily. 30 each    metoprolol succinate (TOPROL-XL) 50 MG 24 hr tablet TAKE 1 TABLET BY MOUTH  DAILY WITH OR IMMEDIATELY  FOLLOWING A MEAL 90 tablet 3   potassium chloride (KLOR-CON) 20 MEQ  packet Take by mouth daily.     predniSONE (DELTASONE) 5 MG tablet Take 5 mg by mouth daily.       Probiotic Product (PROBIOTIC DAILY PO) Take 1 tablet by mouth daily.     simvastatin (ZOCOR) 20 MG tablet Take 1 tablet by mouth at  bedtime (Patient taking differently: Take 20 mg by mouth at bedtime. ) 90 tablet 0   warfarin (COUMADIN) 5 MG tablet TAKE 1 TO 1 AND 1/2 TABLETS BY MOUTH DAILY AS DIRECTED  BY COUMADIN CLINIC (Patient taking differently: 5 mg. ) 135 tablet 1   warfarin (COUMADIN) 5 MG tablet Take by mouth daily.      No current facility-administered medications for this encounter.  Allergies  Allergen Reactions   Spironolactone Other (See Comments)    Hyperkalemia    Social History   Socioeconomic History   Marital status: Divorced    Spouse name: Not on file   Number of children: 3   Years of education: Not on file   Highest education level: Not on file  Occupational History   Occupation: Retired  Scientist, product/process development strain: Not on file   Food insecurity    Worry: Not on file    Inability: Not on Lexicographer needs    Medical: Not on file    Non-medical: Not on file  Tobacco Use   Smoking status: Never Smoker   Smokeless tobacco: Never Used  Substance and Sexual Activity   Alcohol use: No   Drug use: No   Sexual activity: Not on file  Lifestyle   Physical activity    Days per week: Not on file    Minutes per session: Not on file   Stress: Not on file  Relationships   Social connections    Talks on phone: Not on file    Gets together: Not on file    Attends religious service: Not on file    Active member of club or organization: Not on file    Attends meetings of clubs or organizations: Not on file    Relationship status: Not on file   Intimate partner violence    Fear of current or ex partner: Not on file    Emotionally abused: Not on file    Physically abused: Not on file    Forced sexual activity:  Not on file  Other Topics Concern   Not on file  Social History Narrative   Lives alone in [place].    Family History  Problem Relation Age of Onset   Alzheimer's disease Father    Cirrhosis Father    Cancer Father        prostate   Heart attack Neg Hx     ROS- All systems are reviewed and negative except as per the HPI above  Physical Exam: Vitals:   12/03/18 1453  BP: 138/74  Pulse: 67  Weight: 121.3 kg  Height: 5\' 5"  (1.651 m)   Wt Readings from Last 3 Encounters:  12/03/18 121.3 kg  09/08/18 121.6 kg  08/05/18 120.3 kg    Labs: Lab Results  Component Value Date   NA 134 (L) 08/05/2018   K 4.2 08/05/2018   CL 102 08/05/2018   CO2 26 08/05/2018   GLUCOSE 81 08/05/2018   BUN 23 08/05/2018   CREATININE 1.13 (H) 08/05/2018   CALCIUM 8.9 08/05/2018   MG 2.0 08/05/2018   Lab Results  Component Value Date   INR 3.7 (A) 11/20/2018   Lab Results  Component Value Date   CHOL 157 06/02/2014   HDL 101 06/02/2014   LDLCALC 40 06/02/2014   TRIG 78 06/02/2014    GEN- The patient is well appearing obese female, alert and oriented x 3 today.   HEENT-head normocephalic, atraumatic, sclera clear, conjunctiva pink, hearing intact, trachea midline. Lungs- Clear to ausculation bilaterally, normal work of breathing Heart- Regular rate and rhythm, no murmurs, rubs or gallops  GI- soft, NT, ND, + BS Extremities- no clubbing, cyanosis, or edema MS- no significant deformity or atrophy Skin- no rash or lesion Psych- euthymic mood, full affect Neuro- strength and sensation are intact   EKG- A sensed V paced with 1st degree AV  block. HR 67, PR 230, QRS 182, QTc 509  Echo-Study Conclusions  - Left ventricle: The cavity size was mildly dilated. Wall   thickness was increased in a pattern of mild LVH. Marked   septal-lateral dyssynchrony with septal hypokinesis. The   estimated ejection fraction was 45%. Doppler parameters are   consistent with abnormal left  ventricular relaxation (grade 1   diastolic dysfunction). - Aortic valve: Mechanical aortic valve. Mean gradient not   significantly elevated across the aortic valve. There was trivial   regurgitation. Mean gradient (S): 16 mm Hg. - Mitral valve: Mildly calcified annulus. There was moderate   regurgitation. - Left atrium: The atrium was mildly dilated. - Right ventricle: The cavity size was moderately dilated. Pacer   wire or catheter noted in right ventricle. Systolic function was   normal. - Right atrium: The atrium was moderately dilated. - Tricuspid valve: There was severe regurgitation with incomplete   coaptation of the tricuspid leaflets. Peak RV-RA gradient (S): 27   mm Hg. - Pulmonary arteries: PA peak pressure: 42 mm Hg (S). - Systemic veins: IVC measured 2.8 cm with < 50% respirophasic   variation, suggesting RA pressure 15 mmHg.  Impressions:  - Mildly dilated LV with EF 45%. Septal-lateral dyssynchrony with   septal hypokinesis. Moderately dilated RV with normal systolic   function. Moderate mitral regurgitation. Severe tricuspid   regurgitation with incomplete coaptation of tricuspid leaflets.   Mechanical aortic valve appeared to function normally. Mild   pulmonary hypertension.   Assessment and Plan: 1. Persistent atrial fibrillation   Patient appears to be maintaining SR. Continue dofetilide 250 mcg BID. QT stable. Bmet/mag today.  Continue warfarin  2. Complete heart block S/p PPM, followed by Dr Rayann Heman and device clinic.   3. Aortic valve disease S/p AVR with mechanical valve. Followed by Dr Gwenlyn Found.  4. HTN Stable, no changes today.  5. Obesity Body mass index is 44.5 kg/m. Lifestyle modification was discussed and encouraged including regular physical activity and weight reduction.   Follow up in the AF clinic in 4 months.   [facility] [address] [place], [place] [address] [phone]

## 2018-12-04 ENCOUNTER — Ambulatory Visit (INDEPENDENT_AMBULATORY_CARE_PROVIDER_SITE_OTHER): Payer: Medicare Other | Admitting: Cardiology

## 2018-12-04 DIAGNOSIS — Z952 Presence of prosthetic heart valve: Secondary | ICD-10-CM

## 2018-12-04 DIAGNOSIS — Z5181 Encounter for therapeutic drug level monitoring: Secondary | ICD-10-CM | POA: Diagnosis not present

## 2018-12-04 LAB — POCT INR: INR: 5.7 — AB (ref 2.0–3.0)

## 2018-12-18 ENCOUNTER — Ambulatory Visit (INDEPENDENT_AMBULATORY_CARE_PROVIDER_SITE_OTHER): Payer: Medicare Other | Admitting: Cardiology

## 2018-12-18 DIAGNOSIS — Z952 Presence of prosthetic heart valve: Secondary | ICD-10-CM | POA: Diagnosis not present

## 2018-12-18 DIAGNOSIS — Z5181 Encounter for therapeutic drug level monitoring: Secondary | ICD-10-CM | POA: Diagnosis not present

## 2018-12-18 LAB — POCT INR: INR: 3 (ref 2.0–3.0)

## 2018-12-24 ENCOUNTER — Other Ambulatory Visit: Payer: Self-pay | Admitting: Otolaryngology

## 2018-12-24 DIAGNOSIS — J3489 Other specified disorders of nose and nasal sinuses: Secondary | ICD-10-CM

## 2018-12-30 NOTE — Telephone Encounter (Signed)
Did not need this encounter °

## 2019-01-01 ENCOUNTER — Ambulatory Visit (INDEPENDENT_AMBULATORY_CARE_PROVIDER_SITE_OTHER): Payer: Medicare Other | Admitting: Cardiovascular Disease

## 2019-01-01 DIAGNOSIS — Z952 Presence of prosthetic heart valve: Secondary | ICD-10-CM

## 2019-01-01 DIAGNOSIS — Z5181 Encounter for therapeutic drug level monitoring: Secondary | ICD-10-CM | POA: Diagnosis not present

## 2019-01-01 LAB — POCT INR: INR: 3 (ref 2.0–3.0)

## 2019-01-12 ENCOUNTER — Ambulatory Visit
Admission: RE | Admit: 2019-01-12 | Discharge: 2019-01-12 | Disposition: A | Discharge status: Home / Self Care | Payer: Medicare Other | Source: Ambulatory Visit | Attending: Otolaryngology | Admitting: Otolaryngology

## 2019-01-12 DIAGNOSIS — J3489 Other specified disorders of nose and nasal sinuses: Secondary | ICD-10-CM

## 2019-01-14 ENCOUNTER — Ambulatory Visit (INDEPENDENT_AMBULATORY_CARE_PROVIDER_SITE_OTHER): Payer: Medicare Other | Admitting: *Deleted

## 2019-01-14 DIAGNOSIS — I442 Atrioventricular block, complete: Secondary | ICD-10-CM | POA: Diagnosis not present

## 2019-01-16 LAB — CUP PACEART REMOTE DEVICE CHECK
Battery Remaining Longevity: 128 mo
Battery Remaining Percentage: 95.5 %
Battery Voltage: 2.98 V
Brady Statistic AP VP Percent: 18 %
Brady Statistic AP VS Percent: 1 %
Brady Statistic AS VP Percent: 82 %
Brady Statistic AS VS Percent: 1 %
Brady Statistic RA Percent Paced: 16 %
Brady Statistic RV Percent Paced: 99 %
Date Time Interrogation Session: 20201124020015
Implantable Lead Implant Date: 20150116
Implantable Lead Implant Date: 20150116
Implantable Lead Location: 753859
Implantable Lead Location: 753860
Implantable Lead Model: 5076
Implantable Pulse Generator Implant Date: 20150116
Lead Channel Impedance Value: 360 Ohm
Lead Channel Impedance Value: 580 Ohm
Lead Channel Pacing Threshold Amplitude: 0.625 V
Lead Channel Pacing Threshold Amplitude: 0.75 V
Lead Channel Pacing Threshold Pulse Width: 0.4 ms
Lead Channel Pacing Threshold Pulse Width: 0.4 ms
Lead Channel Sensing Intrinsic Amplitude: 12 mV
Lead Channel Sensing Intrinsic Amplitude: 4.2 mV
Lead Channel Setting Pacing Amplitude: 0.875
Lead Channel Setting Pacing Amplitude: 2 V
Lead Channel Setting Pacing Pulse Width: 0.4 ms
Lead Channel Setting Sensing Sensitivity: 2 mV
Pulse Gen Model: 2240
Pulse Gen Serial Number: 7587004

## 2019-01-22 ENCOUNTER — Ambulatory Visit (INDEPENDENT_AMBULATORY_CARE_PROVIDER_SITE_OTHER): Payer: Medicare Other | Admitting: Cardiology

## 2019-01-22 DIAGNOSIS — I5031 Acute diastolic (congestive) heart failure: Secondary | ICD-10-CM | POA: Diagnosis not present

## 2019-01-22 DIAGNOSIS — I5043 Acute on chronic combined systolic (congestive) and diastolic (congestive) heart failure: Secondary | ICD-10-CM | POA: Diagnosis not present

## 2019-01-22 DIAGNOSIS — Z5181 Encounter for therapeutic drug level monitoring: Secondary | ICD-10-CM | POA: Diagnosis not present

## 2019-01-22 DIAGNOSIS — Z952 Presence of prosthetic heart valve: Secondary | ICD-10-CM

## 2019-01-22 DIAGNOSIS — I480 Paroxysmal atrial fibrillation: Secondary | ICD-10-CM | POA: Diagnosis not present

## 2019-01-22 LAB — POCT INR: INR: 2.5 (ref 2.0–3.0)

## 2019-02-09 ENCOUNTER — Other Ambulatory Visit (HOSPITAL_COMMUNITY): Payer: Self-pay | Admitting: Physician Assistant

## 2019-02-09 LAB — POCT INR: INR: 2 (ref 2.0–3.0)

## 2019-02-10 ENCOUNTER — Ambulatory Visit (INDEPENDENT_AMBULATORY_CARE_PROVIDER_SITE_OTHER): Payer: Medicare Other | Admitting: Cardiology

## 2019-02-10 DIAGNOSIS — Z952 Presence of prosthetic heart valve: Secondary | ICD-10-CM | POA: Diagnosis not present

## 2019-02-10 DIAGNOSIS — Z5181 Encounter for therapeutic drug level monitoring: Secondary | ICD-10-CM

## 2019-02-23 LAB — POCT INR: INR: 2.1 (ref 2.0–3.0)

## 2019-02-24 ENCOUNTER — Ambulatory Visit (INDEPENDENT_AMBULATORY_CARE_PROVIDER_SITE_OTHER): Payer: Medicare Other | Admitting: Pharmacist Clinician (PhC)/ Clinical Pharmacy Specialist

## 2019-02-24 DIAGNOSIS — Z952 Presence of prosthetic heart valve: Secondary | ICD-10-CM

## 2019-02-24 DIAGNOSIS — Z5181 Encounter for therapeutic drug level monitoring: Secondary | ICD-10-CM | POA: Diagnosis not present

## 2019-03-10 ENCOUNTER — Ambulatory Visit (INDEPENDENT_AMBULATORY_CARE_PROVIDER_SITE_OTHER): Payer: Medicare Other | Admitting: Pharmacist

## 2019-03-10 DIAGNOSIS — Z7901 Long term (current) use of anticoagulants: Secondary | ICD-10-CM

## 2019-03-10 DIAGNOSIS — I480 Paroxysmal atrial fibrillation: Secondary | ICD-10-CM

## 2019-03-10 DIAGNOSIS — R760 Raised antibody titer: Secondary | ICD-10-CM

## 2019-03-10 LAB — POCT INR: INR: 2.8 (ref 2.0–3.0)

## 2019-03-26 LAB — POCT INR: INR: 1.9 — AB (ref 2.0–3.0)

## 2019-03-27 ENCOUNTER — Ambulatory Visit (INDEPENDENT_AMBULATORY_CARE_PROVIDER_SITE_OTHER): Payer: Medicare Other | Admitting: Pharmacist

## 2019-03-27 DIAGNOSIS — Z5181 Encounter for therapeutic drug level monitoring: Secondary | ICD-10-CM

## 2019-03-27 DIAGNOSIS — Z952 Presence of prosthetic heart valve: Secondary | ICD-10-CM

## 2019-04-03 ENCOUNTER — Ambulatory Visit (INDEPENDENT_AMBULATORY_CARE_PROVIDER_SITE_OTHER): Payer: Medicare Other | Admitting: Pharmacist Clinician (PhC)/ Clinical Pharmacy Specialist

## 2019-04-03 DIAGNOSIS — Z5181 Encounter for therapeutic drug level monitoring: Secondary | ICD-10-CM | POA: Diagnosis not present

## 2019-04-03 DIAGNOSIS — Z952 Presence of prosthetic heart valve: Secondary | ICD-10-CM

## 2019-04-03 LAB — POCT INR: INR: 2.8 (ref 2.0–3.0)

## 2019-04-08 ENCOUNTER — Encounter (HOSPITAL_COMMUNITY): Payer: Self-pay | Admitting: Physician Assistant

## 2019-04-08 ENCOUNTER — Ambulatory Visit (HOSPITAL_COMMUNITY)
Admission: RE | Admit: 2019-04-08 | Discharge: 2019-04-08 | Disposition: A | Discharge status: Home / Self Care | Payer: Medicare Other | Source: Ambulatory Visit | Attending: Physician Assistant | Admitting: Physician Assistant

## 2019-04-08 ENCOUNTER — Other Ambulatory Visit: Payer: Self-pay

## 2019-04-08 VITALS — BP 130/82 | HR 72 | Ht 65.0 in | Wt 265.0 lb

## 2019-04-08 DIAGNOSIS — Z8042 Family history of malignant neoplasm of prostate: Secondary | ICD-10-CM | POA: Diagnosis not present

## 2019-04-08 DIAGNOSIS — Z7901 Long term (current) use of anticoagulants: Secondary | ICD-10-CM | POA: Insufficient documentation

## 2019-04-08 DIAGNOSIS — M329 Systemic lupus erythematosus, unspecified: Secondary | ICD-10-CM | POA: Diagnosis not present

## 2019-04-08 DIAGNOSIS — D6869 Other thrombophilia: Secondary | ICD-10-CM

## 2019-04-08 DIAGNOSIS — Z791 Long term (current) use of non-steroidal anti-inflammatories (NSAID): Secondary | ICD-10-CM | POA: Diagnosis not present

## 2019-04-08 DIAGNOSIS — J45909 Unspecified asthma, uncomplicated: Secondary | ICD-10-CM | POA: Insufficient documentation

## 2019-04-08 DIAGNOSIS — I701 Atherosclerosis of renal artery: Secondary | ICD-10-CM | POA: Diagnosis not present

## 2019-04-08 DIAGNOSIS — Z79899 Other long term (current) drug therapy: Secondary | ICD-10-CM | POA: Diagnosis not present

## 2019-04-08 DIAGNOSIS — E669 Obesity, unspecified: Secondary | ICD-10-CM | POA: Insufficient documentation

## 2019-04-08 DIAGNOSIS — Z950 Presence of cardiac pacemaker: Secondary | ICD-10-CM | POA: Insufficient documentation

## 2019-04-08 DIAGNOSIS — I739 Peripheral vascular disease, unspecified: Secondary | ICD-10-CM | POA: Diagnosis not present

## 2019-04-08 DIAGNOSIS — I4819 Other persistent atrial fibrillation: Secondary | ICD-10-CM | POA: Insufficient documentation

## 2019-04-08 DIAGNOSIS — Z9851 Tubal ligation status: Secondary | ICD-10-CM | POA: Insufficient documentation

## 2019-04-08 DIAGNOSIS — E785 Hyperlipidemia, unspecified: Secondary | ICD-10-CM | POA: Insufficient documentation

## 2019-04-08 DIAGNOSIS — Z9049 Acquired absence of other specified parts of digestive tract: Secondary | ICD-10-CM | POA: Insufficient documentation

## 2019-04-08 DIAGNOSIS — I272 Pulmonary hypertension, unspecified: Secondary | ICD-10-CM | POA: Insufficient documentation

## 2019-04-08 DIAGNOSIS — I2510 Atherosclerotic heart disease of native coronary artery without angina pectoris: Secondary | ICD-10-CM | POA: Insufficient documentation

## 2019-04-08 DIAGNOSIS — R9431 Abnormal electrocardiogram [ECG] [EKG]: Secondary | ICD-10-CM | POA: Insufficient documentation

## 2019-04-08 DIAGNOSIS — Z7952 Long term (current) use of systemic steroids: Secondary | ICD-10-CM | POA: Diagnosis not present

## 2019-04-08 DIAGNOSIS — Z6841 Body Mass Index (BMI) 40.0 and over, adult: Secondary | ICD-10-CM | POA: Diagnosis not present

## 2019-04-08 DIAGNOSIS — I10 Essential (primary) hypertension: Secondary | ICD-10-CM | POA: Diagnosis not present

## 2019-04-08 DIAGNOSIS — I442 Atrioventricular block, complete: Secondary | ICD-10-CM | POA: Insufficient documentation

## 2019-04-08 DIAGNOSIS — Z888 Allergy status to other drugs, medicaments and biological substances status: Secondary | ICD-10-CM | POA: Diagnosis not present

## 2019-04-08 DIAGNOSIS — I083 Combined rheumatic disorders of mitral, aortic and tricuspid valves: Secondary | ICD-10-CM | POA: Diagnosis not present

## 2019-04-08 LAB — BASIC METABOLIC PANEL
Anion gap: 11 (ref 5–15)
BUN: 26 mg/dL — ABNORMAL HIGH (ref 8–23)
CO2: 28 mmol/L (ref 22–32)
Calcium: 9.6 mg/dL (ref 8.9–10.3)
Chloride: 100 mmol/L (ref 98–111)
Creatinine, Ser: 1.36 mg/dL — ABNORMAL HIGH (ref 0.44–1.00)
GFR calc Af Amer: 47 mL/min — ABNORMAL LOW (ref >60)
GFR calc non Af Amer: 40 mL/min — ABNORMAL LOW (ref >60)
Glucose, Bld: 83 mg/dL (ref 70–99)
Potassium: 4.5 mmol/L (ref 3.5–5.1)
Sodium: 139 mmol/L (ref 135–145)

## 2019-04-08 LAB — MAGNESIUM: Magnesium: 1.9 mg/dL (ref 1.7–2.4)

## 2019-04-08 NOTE — Progress Notes (Addendum)
Primary Care Physician: [name], MD Primary Cardiologist: Dr Gwenlyn Found Primary EP: Dr Rayann Heman Referring Physician:Dr. Rayann Heman   [name] is a 69 y.o. female with a h/o PPM,CAD, mechanical  AVR on warfarin, persistent afib for the last 30 days per [facility] device alert brought to the attention of [facility], NP/ Dr. Rayann Heman. He feels that is is worth a try to try pt on tikosyn as she is having more issues with edema and shortness of breath in the last several weeks.  He is concerned with her obesity and severe TR that the results may be marginal. The pt is informed of dofetilide admission and what is to be expected with hospitalization for same. She is interested in pursing. EKG shows v paced today at 76 bpm. She is on warfarin with a CHADS2VASC score of 4.  F/u in afib clinic, 2/11, she is here for dofetilide admit. She plans to go thru [facility] for her med.  F/u in afib clinic, 2/24, s/p one week dofetilide load. She appears to be back in rhythm with a sensed, v paced ekg tracing. She feels improved, much less short of breath.She asks me about 4 more drugs that she takes on an intermittent basis that will be passed on to PharmD for additional screening.   F/u in AF clinic on 08/05/18. Patient reports that she is doing very well on Tikosyn. She denies any symptoms of heart racing or palpitations. She is trying to increase her activity level by walking.  Follow up in AF clinic 12/03/18. Patient reports doing well since her last visit. She did have one brief episode of palpitations two days ago when she laid down at night but this quickly resolved.   Follow up in the AF clinic 04/08/19. Patient reports she has done very well since her last visit. She has not had any heart racing or palpitations. She is tolerating the medication without difficulty.   Today, she denies symptoms of palpitations, chest pain, shortness of breath, orthopnea, PND, lower extremity edema, dizziness, presyncope, syncope,  or neurologic sequela. The patient is tolerating medications without difficulties and is otherwise without complaint today.   Past Medical History:  Diagnosis Date  . Antiphospholipid antibody positive 06/28/2017  . Asthma   . CAD (coronary artery disease)    a. normal cors by cath in 03/2015  . Carotid artery disease ([place])   . Chronic anticoagulation   . Complete heart block ([place]) 02/2013   a. Implantation of a dual chamber pacemaker in 02/2013 by Dr Rayann Heman. STJ model number [id number] pacemaker with model number 2088 right ventricular lead.   [name] Dyslipidemia   . H/O Doppler ultrasound 10/03/2010   carotid duplex - R ICA normal patency; L CCA-ICA bypass gradt patent common to interal cartoid bypass graft   . H/O mechanical aortic valve replacement   . History of echocardiogram 11/08/2011   EF >55%; mild-mod concentric LVH; trace aortic regurg.; LA mild-mod dilated  . History of nuclear stress test 01/20/2010   normal pattern of perfusion; low risk scan  . Hypertension   . Lupus (systemic lupus erythematosus) ([place])   . PVD (peripheral vascular disease) ([facility])    60% R renal artery stenosis; non-functioning L kidney   Past Surgical History:  Procedure Laterality Date  . AORTIC VALVE REPLACEMENT  11/29/006   [place]; [place]; on coumadin  . CARDIAC CATHETERIZATION  11/01/2004   define anatomy   . CARDIAC CATHETERIZATION N/A 04/19/2015   Procedure: Coronary Angiography;  Surgeon: Peter M [name], MD;  Location: [facility];  Service: Cardiovascular;  Laterality: N/A;  . CAROTID ENDARTERECTOMY     lt.  . CHOLECYSTECTOMY  1999  . EXPLORATORY LAPAROTOMY  04/12/2012   small bowel perf; ileostomy & R hemicolectomy   . PACEMAKER INSERTION  03/06/2013   SJM Assurity DR PPM implanted by Dr Rayann Heman for complete heart block  . PERMANENT PACEMAKER INSERTION N/A 03/06/2013   Procedure: PERMANENT PACEMAKER INSERTION;  Surgeon: Coralyn Mark, MD;  Location: [facility];  Service: Cardiovascular;   Laterality: N/A;  . TEMPORARY PACEMAKER INSERTION N/A 03/06/2013   Procedure: TEMPORARY PACEMAKER INSERTION;  Surgeon: Coralyn Mark, MD;  Location: [facility];  Service: Cardiovascular;  Laterality: N/A;  . TUBAL LIGATION  11/1982    Current Outpatient Medications  Medication Sig Dispense Refill  . acetaminophen (TYLENOL) 325 MG tablet Take 650 mg by mouth every 6 (six) hours as needed for moderate pain.     [name] albuterol (PROVENTIL HFA;VENTOLIN HFA) 108 (90 Base) MCG/ACT inhaler Inhale 2 puffs into the lungs every 6 (six) hours as needed for wheezing or shortness of breath. 3 Inhaler 1  . allopurinol (ZYLOPRIM) 100 MG tablet Take 100 mg by mouth daily.     [name] amoxicillin (AMOXIL) 500 MG capsule TK 4 CS PO ONE HOUR BEFORE APPT    . Calcium Carbonate (CALTRATE 600) 1500 MG TABS Take 1 tablet by mouth 2 (two) times daily.      . cetirizine (ZYRTEC) 10 MG tablet Take 10 mg by mouth at bedtime.    . chlorhexidine (PERIDEX) 0.12 % solution Use as directed 10 mLs in the mouth or throat 2 (two) times daily as needed (before dental appts).   5  . cholecalciferol (VITAMIN D) 1000 UNITS tablet Take 2,000 Units by mouth 2 (two) times daily.     . clotrimazole-betamethasone (LOTRISONE) cream as needed.    . colestipol (COLESTID) 1 g tablet Take 1 g by mouth daily.    [name] dofetilide (TIKOSYN) 250 MCG capsule Take 1 capsule (250 mcg total) by mouth 2 (two) times daily. 60 capsule 6  . Fluticasone-Salmeterol (WIXELA INHUB) 250-50 MCG/DOSE AEPB Inhale 1 puff into the lungs daily. 180 each 3  . furosemide (LASIX) 20 MG tablet Take 1 tablet (20 mg total) by mouth daily. 30 tablet 6  . hydroxychloroquine (PLAQUENIL) 200 MG tablet Take 200 mg by mouth daily.    . isosorbide mononitrate (IMDUR) 30 MG 24 hr tablet TAKE 1 TABLET BY MOUTH  DAILY 90 tablet 3  . leflunomide (ARAVA) 10 MG tablet Take 10 mg by mouth daily.    [name] loratadine (CLARITIN) 10 MG tablet Take 10 mg by mouth daily.    . Magnesium 200 MG TABS Take 1  tablet (200 mg total) by mouth daily. 30 each   . metoprolol succinate (TOPROL-XL) 50 MG 24 hr tablet TAKE 1 TABLET BY MOUTH  DAILY WITH OR IMMEDIATELY  FOLLOWING A MEAL 90 tablet 3  . potassium chloride SA (KLOR-CON) 20 MEQ tablet TAKE 1  BY MOUTH TWICE DAILY (Patient taking differently: Take 20 mEq by mouth once. ) 60 tablet 6  . predniSONE (DELTASONE) 5 MG tablet Take 5 mg by mouth daily.      . Probiotic Product (PROBIOTIC DAILY PO) Take 1 tablet by mouth daily.    . simvastatin (ZOCOR) 20 MG tablet Take 1 tablet by mouth at  bedtime (Patient taking differently: Take 20 mg by mouth at  bedtime. ) 90 tablet 0  . warfarin (COUMADIN) 5 MG tablet TAKE 1 TO 1 AND 1/2 TABLETS BY MOUTH DAILY AS DIRECTED  BY COUMADIN CLINIC (Patient taking differently: 5 mg. ) 135 tablet 1   No current facility-administered medications for this encounter.    Allergies  Allergen Reactions  . Spironolactone Other (See Comments)    Hyperkalemia    Social History   Socioeconomic History  . Marital status: Divorced    Spouse name: Not on file  . Number of children: 3  . Years of education: Not on file  . Highest education level: Not on file  Occupational History  . Occupation: Retired  Tobacco Use  . Smoking status: Never Smoker  . Smokeless tobacco: Never Used  Substance and Sexual Activity  . Alcohol use: No  . Drug use: No  . Sexual activity: Not on file  Other Topics Concern  . Not on file  Social History Narrative   Lives alone in [place].   Social Determinants of Health   Financial Resource Strain:   . Difficulty of Paying Living Expenses: Not on file  Food Insecurity:   . Worried About Charity fundraiser in the Last Year: Not on file  . Ran Out of Food in the Last Year: Not on file  Transportation Needs:   . Lack of Transportation (Medical): Not on file  . Lack of Transportation (Non-Medical): Not on file  Physical Activity:   . Days of Exercise per Week: Not on file  . Minutes  of Exercise per Session: Not on file  Stress:   . Feeling of Stress : Not on file  Social Connections:   . Frequency of Communication with Friends and Family: Not on file  . Frequency of Social Gatherings with Friends and Family: Not on file  . Attends Religious Services: Not on file  . Active Member of Clubs or Organizations: Not on file  . Attends Archivist Meetings: Not on file  . Marital Status: Not on file  Intimate Partner Violence:   . Fear of Current or Ex-Partner: Not on file  . Emotionally Abused: Not on file  . Physically Abused: Not on file  . Sexually Abused: Not on file    Family History  Problem Relation Age of Onset  . Alzheimer's disease Father   . Cirrhosis Father   . Cancer Father        prostate  . Heart attack Neg Hx     ROS- All systems are reviewed and negative except as per the HPI above  Physical Exam: Vitals:   04/08/19 1344  BP: 130/82  Pulse: 72  Weight: 120.2 kg  Height: 5\' 5"  (1.651 m)   Wt Readings from Last 3 Encounters:  04/08/19 120.2 kg  12/03/18 121.3 kg  09/08/18 121.6 kg    Labs: Lab Results  Component Value Date   NA 139 12/03/2018   K 4.7 12/03/2018   CL 103 12/03/2018   CO2 26 12/03/2018   GLUCOSE 101 (H) 12/03/2018   BUN 25 (H) 12/03/2018   CREATININE 1.37 (H) 12/03/2018   CALCIUM 9.1 12/03/2018   MG 2.0 12/03/2018   Lab Results  Component Value Date   INR 2.8 04/03/2019   Lab Results  Component Value Date   CHOL 157 06/02/2014   HDL 101 06/02/2014   LDLCALC 40 06/02/2014   TRIG 78 06/02/2014    GEN- The patient is well appearing obese female, alert and oriented x 3  today.   HEENT-head normocephalic, atraumatic, sclera clear, conjunctiva pink, hearing intact, trachea midline. Lungs- Clear to ausculation bilaterally, normal work of breathing Heart- Regular rate and rhythm, no murmurs, rubs or gallops  GI- soft, NT, ND, + BS Extremities- no clubbing, cyanosis, or edema MS- no significant  deformity or atrophy Skin- no rash or lesion Psych- euthymic mood, full affect Neuro- strength and sensation are intact   EKG- A sensed V paced rhythm HR 72, PR 212, QRS 206, QTc 529  Echo-Study Conclusions  - Left ventricle: The cavity size was mildly dilated. Wall   thickness was increased in a pattern of mild LVH. Marked   septal-lateral dyssynchrony with septal hypokinesis. The   estimated ejection fraction was 45%. Doppler parameters are   consistent with abnormal left ventricular relaxation (grade 1   diastolic dysfunction). - Aortic valve: Mechanical aortic valve. Mean gradient not   significantly elevated across the aortic valve. There was trivial   regurgitation. Mean gradient (S): 16 mm Hg. - Mitral valve: Mildly calcified annulus. There was moderate   regurgitation. - Left atrium: The atrium was mildly dilated. - Right ventricle: The cavity size was moderately dilated. Pacer   wire or catheter noted in right ventricle. Systolic function was   normal. - Right atrium: The atrium was moderately dilated. - Tricuspid valve: There was severe regurgitation with incomplete   coaptation of the tricuspid leaflets. Peak RV-RA gradient (S): 27   mm Hg. - Pulmonary arteries: PA peak pressure: 42 mm Hg (S). - Systemic veins: IVC measured 2.8 cm with < 50% respirophasic   variation, suggesting RA pressure 15 mmHg.  Impressions:  - Mildly dilated LV with EF 45%. Septal-lateral dyssynchrony with   septal hypokinesis. Moderately dilated RV with normal systolic   function. Moderate mitral regurgitation. Severe tricuspid   regurgitation with incomplete coaptation of tricuspid leaflets.   Mechanical aortic valve appeared to function normally. Mild   pulmonary hypertension.   Assessment and Plan: 1. Persistent atrial fibrillation   Patient appears to be maintaining SR. Continue dofetilide 250 mcg BID. QT stable. Check bmet/mag today. Continue warfarin  This patients  CHA2DS2-VASc Score and unadjusted Ischemic Stroke Rate (% per year) is equal to 4.8 % stroke rate/year from a score of 4  Above score calculated as 1 point each if present [CHF, HTN, DM, Vascular=MI/PAD/Aortic Plaque, Age if 65-74, or Female] Above score calculated as 2 points each if present [Age > 75, or Stroke/TIA/TE]  2. Complete heart block S/p PPM, followed by Dr Rayann Heman and the device clinic.  3. Aortic valve disease S/p AVR with mechanical valve. Followed by Dr Gwenlyn Found.  4. HTN Stable, no changes today.  5. Obesity Body mass index is 44.1 kg/m. Lifestyle modification was discussed and encouraged including regular physical activity and weight reduction.   Follow up in the AF clinic in 6 months.   [facility] [address] [place], [place] [address] [phone]

## 2019-04-15 ENCOUNTER — Ambulatory Visit (INDEPENDENT_AMBULATORY_CARE_PROVIDER_SITE_OTHER): Payer: Medicare Other | Admitting: *Deleted

## 2019-04-15 DIAGNOSIS — I442 Atrioventricular block, complete: Secondary | ICD-10-CM | POA: Diagnosis not present

## 2019-04-15 LAB — CUP PACEART REMOTE DEVICE CHECK
Battery Remaining Longevity: 120 mo
Battery Remaining Percentage: 95.5 %
Battery Voltage: 2.96 V
Brady Statistic AP VP Percent: 19 %
Brady Statistic AP VS Percent: 1 %
Brady Statistic AS VP Percent: 81 %
Brady Statistic AS VS Percent: 1 %
Brady Statistic RA Percent Paced: 17 %
Brady Statistic RV Percent Paced: 99 %
Date Time Interrogation Session: 20210224024151
Implantable Lead Implant Date: 20150116
Implantable Lead Implant Date: 20150116
Implantable Lead Location: 753859
Implantable Lead Location: 753860
Implantable Lead Model: 5076
Implantable Pulse Generator Implant Date: 20150116
Lead Channel Impedance Value: 360 Ohm
Lead Channel Impedance Value: 540 Ohm
Lead Channel Pacing Threshold Amplitude: 0.75 V
Lead Channel Pacing Threshold Amplitude: 0.875 V
Lead Channel Pacing Threshold Pulse Width: 0.4 ms
Lead Channel Pacing Threshold Pulse Width: 0.4 ms
Lead Channel Sensing Intrinsic Amplitude: 12 mV
Lead Channel Sensing Intrinsic Amplitude: 4.3 mV
Lead Channel Setting Pacing Amplitude: 1.125
Lead Channel Setting Pacing Amplitude: 2 V
Lead Channel Setting Pacing Pulse Width: 0.4 ms
Lead Channel Setting Sensing Sensitivity: 2 mV
Pulse Gen Model: 2240
Pulse Gen Serial Number: 7587004

## 2019-04-16 NOTE — Progress Notes (Signed)
PPM Remote  

## 2019-04-17 ENCOUNTER — Ambulatory Visit: Payer: Medicare Other | Attending: Internal Medicine

## 2019-04-17 DIAGNOSIS — Z23 Encounter for immunization: Secondary | ICD-10-CM | POA: Insufficient documentation

## 2019-04-17 NOTE — Progress Notes (Signed)
   Covid-19 Vaccination Clinic  Name:  [name]    MRN: [id number] DOB: [birth year]  04/17/2019  [name] was observed post Covid-19 immunization for 15 minutes without incidence. She was provided with Vaccine Information Sheet and instruction to access the V-Safe system.   [name] was instructed to call 911 with any severe reactions post vaccine: [name] Difficulty breathing  . Swelling of your face and throat  . A fast heartbeat  . A bad rash all over your body  . Dizziness and weakness    Immunizations Administered    Name Date Dose VIS Date Route   Pfizer COVID-19 Vaccine 04/17/2019 12:21 PM 0.3 mL 01/30/2019 Intramuscular   Manufacturer: [place]   Lot: [id number]   [place]: [id number]

## 2019-04-21 LAB — POCT INR: INR: 2.1 (ref 2.0–3.0)

## 2019-04-22 ENCOUNTER — Ambulatory Visit (INDEPENDENT_AMBULATORY_CARE_PROVIDER_SITE_OTHER): Payer: Medicare Other | Admitting: Internal Medicine

## 2019-04-22 DIAGNOSIS — Z952 Presence of prosthetic heart valve: Secondary | ICD-10-CM

## 2019-04-22 DIAGNOSIS — Z5181 Encounter for therapeutic drug level monitoring: Secondary | ICD-10-CM

## 2019-05-05 ENCOUNTER — Ambulatory Visit (INDEPENDENT_AMBULATORY_CARE_PROVIDER_SITE_OTHER): Payer: Medicare Other | Admitting: Cardiology

## 2019-05-05 DIAGNOSIS — Z5181 Encounter for therapeutic drug level monitoring: Secondary | ICD-10-CM

## 2019-05-05 DIAGNOSIS — Z952 Presence of prosthetic heart valve: Secondary | ICD-10-CM

## 2019-05-05 LAB — POCT INR: INR: 2.6 (ref 2.0–3.0)

## 2019-05-12 ENCOUNTER — Ambulatory Visit: Payer: Medicare Other | Attending: Internal Medicine

## 2019-05-12 DIAGNOSIS — Z23 Encounter for immunization: Secondary | ICD-10-CM

## 2019-05-12 NOTE — Progress Notes (Signed)
   Covid-19 Vaccination Clinic  Name:  GWENLYN HOTTINGER    MRN: [id number] DOB: 1950-03-23  05/12/2019  Ms. Barona was observed post Covid-19 immunization for 15 minutes without incident. She was provided with Vaccine Information Sheet and instruction to access the V-Safe system.   Ms. Stanke was instructed to call 911 with any severe reactions post vaccine: [name] Difficulty breathing  . Swelling of face and throat  . A fast heartbeat  . A bad rash all over body  . Dizziness and weakness   Immunizations Administered    Name Date Dose VIS Date Route   Pfizer COVID-19 Vaccine 05/12/2019  3:06 PM 0.3 mL 01/30/2019 Intramuscular   Manufacturer: [place]   Lot: [id number]   [place]: [id number]

## 2019-05-18 ENCOUNTER — Other Ambulatory Visit: Payer: Self-pay | Admitting: Cardiovascular Disease

## 2019-05-20 LAB — POCT INR: INR: 2.4 (ref 2.0–3.0)

## 2019-05-21 ENCOUNTER — Ambulatory Visit (INDEPENDENT_AMBULATORY_CARE_PROVIDER_SITE_OTHER): Payer: Medicare Other | Admitting: Cardiology

## 2019-05-21 DIAGNOSIS — Z5181 Encounter for therapeutic drug level monitoring: Secondary | ICD-10-CM | POA: Diagnosis not present

## 2019-05-21 DIAGNOSIS — Z952 Presence of prosthetic heart valve: Secondary | ICD-10-CM

## 2019-05-21 LAB — POCT INR: INR: 2.4 (ref 2.0–3.0)

## 2019-06-03 LAB — POCT INR: INR: 2.9 (ref 2.0–3.0)

## 2019-06-04 ENCOUNTER — Ambulatory Visit (INDEPENDENT_AMBULATORY_CARE_PROVIDER_SITE_OTHER): Payer: Medicare Other | Admitting: Cardiology

## 2019-06-04 DIAGNOSIS — Z5181 Encounter for therapeutic drug level monitoring: Secondary | ICD-10-CM | POA: Diagnosis not present

## 2019-06-04 DIAGNOSIS — Z952 Presence of prosthetic heart valve: Secondary | ICD-10-CM | POA: Diagnosis not present

## 2019-06-05 ENCOUNTER — Telehealth (HOSPITAL_COMMUNITY): Payer: Self-pay | Admitting: *Deleted

## 2019-06-05 NOTE — Telephone Encounter (Signed)
Patient called in with complaint of having a "jittery or vibrating" feeling in her chest. She didn't think to take her BP - she wants to send a transmission to ensure her device is ok and what her rhythm was at the time. She was urinating a lot last night and has had a "belly ache" for the last 3 days but no other symptoms. Will call pt back after transmission received.

## 2019-06-05 NOTE — Telephone Encounter (Signed)
Left message with transmission results.

## 2019-06-05 NOTE — Telephone Encounter (Signed)
Transmission received that showed patient is AS/ VP at time of remote at 0902 am on 06/05/19 with device function WNL. AT/AF burden 1.9%. 203 AMS episodes with 4 recorded EGMs that appear to show AF with controlled v-rates , longest episode was 8 minutes on 05/02/19 and last recorded episode was on 05/21/19.1 high v-rate noted that lasted 7 seconds with no EGM.

## 2019-06-19 ENCOUNTER — Ambulatory Visit (INDEPENDENT_AMBULATORY_CARE_PROVIDER_SITE_OTHER): Payer: Medicare Other | Admitting: Pharmacist

## 2019-06-19 DIAGNOSIS — Z952 Presence of prosthetic heart valve: Secondary | ICD-10-CM | POA: Diagnosis not present

## 2019-06-19 DIAGNOSIS — Z5181 Encounter for therapeutic drug level monitoring: Secondary | ICD-10-CM | POA: Diagnosis not present

## 2019-06-19 LAB — POCT INR: INR: 3.1 — AB (ref 2.0–3.0)

## 2019-07-02 ENCOUNTER — Other Ambulatory Visit (HOSPITAL_COMMUNITY): Payer: Self-pay | Admitting: Physician Assistant

## 2019-07-02 LAB — POCT INR: INR: 2.2 (ref 2.0–3.0)

## 2019-07-03 ENCOUNTER — Ambulatory Visit (INDEPENDENT_AMBULATORY_CARE_PROVIDER_SITE_OTHER): Payer: Medicare Other | Admitting: Pharmacist

## 2019-07-03 DIAGNOSIS — Z5181 Encounter for therapeutic drug level monitoring: Secondary | ICD-10-CM

## 2019-07-03 DIAGNOSIS — Z952 Presence of prosthetic heart valve: Secondary | ICD-10-CM

## 2019-07-15 ENCOUNTER — Ambulatory Visit (INDEPENDENT_AMBULATORY_CARE_PROVIDER_SITE_OTHER): Payer: Medicare Other | Admitting: *Deleted

## 2019-07-15 DIAGNOSIS — I4819 Other persistent atrial fibrillation: Secondary | ICD-10-CM

## 2019-07-15 LAB — CUP PACEART REMOTE DEVICE CHECK
Battery Remaining Longevity: 121 mo
Battery Remaining Percentage: 95.5 %
Battery Voltage: 2.96 V
Brady Statistic AP VP Percent: 19 %
Brady Statistic AP VS Percent: 1 %
Brady Statistic AS VP Percent: 80 %
Brady Statistic AS VS Percent: 1 %
Brady Statistic RA Percent Paced: 18 %
Brady Statistic RV Percent Paced: 99 %
Date Time Interrogation Session: 20210526022404
Implantable Lead Implant Date: 20150116
Implantable Lead Implant Date: 20150116
Implantable Lead Location: 753859
Implantable Lead Location: 753860
Implantable Lead Model: 5076
Implantable Pulse Generator Implant Date: 20150116
Lead Channel Impedance Value: 390 Ohm
Lead Channel Impedance Value: 560 Ohm
Lead Channel Pacing Threshold Amplitude: 0.625 V
Lead Channel Pacing Threshold Amplitude: 0.75 V
Lead Channel Pacing Threshold Pulse Width: 0.4 ms
Lead Channel Pacing Threshold Pulse Width: 0.4 ms
Lead Channel Sensing Intrinsic Amplitude: 12 mV
Lead Channel Sensing Intrinsic Amplitude: 4.4 mV
Lead Channel Setting Pacing Amplitude: 0.875
Lead Channel Setting Pacing Amplitude: 2 V
Lead Channel Setting Pacing Pulse Width: 0.4 ms
Lead Channel Setting Sensing Sensitivity: 2 mV
Pulse Gen Model: 2240
Pulse Gen Serial Number: 7587004

## 2019-07-16 LAB — POCT INR: INR: 2.3 (ref 2.0–3.0)

## 2019-07-16 NOTE — Progress Notes (Signed)
Remote pacemaker transmission.   

## 2019-07-21 ENCOUNTER — Ambulatory Visit (INDEPENDENT_AMBULATORY_CARE_PROVIDER_SITE_OTHER): Payer: Medicare Other | Admitting: Pharmacist Clinician (PhC)/ Clinical Pharmacy Specialist

## 2019-07-21 DIAGNOSIS — Z952 Presence of prosthetic heart valve: Secondary | ICD-10-CM

## 2019-07-21 DIAGNOSIS — R760 Raised antibody titer: Secondary | ICD-10-CM | POA: Diagnosis not present

## 2019-07-21 DIAGNOSIS — Z5181 Encounter for therapeutic drug level monitoring: Secondary | ICD-10-CM

## 2019-07-24 ENCOUNTER — Inpatient Hospital Stay (HOSPITAL_COMMUNITY): Admission: RE | Admit: 2019-07-24 | Payer: Medicare Other | Source: Ambulatory Visit

## 2019-07-24 ENCOUNTER — Encounter (HOSPITAL_COMMUNITY): Payer: Medicare Other

## 2019-07-27 ENCOUNTER — Ambulatory Visit (HOSPITAL_COMMUNITY): Payer: Medicare Other

## 2019-07-28 ENCOUNTER — Other Ambulatory Visit: Payer: Self-pay

## 2019-07-28 ENCOUNTER — Other Ambulatory Visit (HOSPITAL_COMMUNITY): Payer: Self-pay | Admitting: Cardiovascular Disease

## 2019-07-28 ENCOUNTER — Ambulatory Visit (HOSPITAL_COMMUNITY)
Admission: RE | Admit: 2019-07-28 | Discharge: 2019-07-28 | Disposition: A | Discharge status: Home / Self Care | Payer: Medicare Other | Source: Ambulatory Visit | Attending: Cardiovascular Disease | Admitting: Cardiovascular Disease

## 2019-07-28 DIAGNOSIS — I6522 Occlusion and stenosis of left carotid artery: Secondary | ICD-10-CM

## 2019-07-28 DIAGNOSIS — I6523 Occlusion and stenosis of bilateral carotid arteries: Secondary | ICD-10-CM | POA: Diagnosis present

## 2019-07-30 ENCOUNTER — Encounter: Payer: Self-pay | Admitting: *Deleted

## 2019-07-30 DIAGNOSIS — I6523 Occlusion and stenosis of bilateral carotid arteries: Secondary | ICD-10-CM

## 2019-07-30 NOTE — Progress Notes (Signed)
This encounter was created in error - please disregard.

## 2019-07-31 ENCOUNTER — Ambulatory Visit (INDEPENDENT_AMBULATORY_CARE_PROVIDER_SITE_OTHER): Payer: Medicare Other | Admitting: Pharmacist

## 2019-07-31 DIAGNOSIS — R760 Raised antibody titer: Secondary | ICD-10-CM | POA: Diagnosis not present

## 2019-07-31 DIAGNOSIS — I480 Paroxysmal atrial fibrillation: Secondary | ICD-10-CM | POA: Diagnosis not present

## 2019-07-31 DIAGNOSIS — Z7901 Long term (current) use of anticoagulants: Secondary | ICD-10-CM

## 2019-07-31 LAB — POCT INR: INR: 2 (ref 2.0–3.0)

## 2019-08-13 ENCOUNTER — Other Ambulatory Visit: Payer: Self-pay

## 2019-08-13 ENCOUNTER — Ambulatory Visit (HOSPITAL_COMMUNITY)
Admission: RE | Admit: 2019-08-13 | Discharge: 2019-08-13 | Disposition: A | Discharge status: Home / Self Care | Payer: Medicare Other | Source: Ambulatory Visit | Attending: Internal Medicine | Admitting: Internal Medicine

## 2019-08-13 DIAGNOSIS — I701 Atherosclerosis of renal artery: Secondary | ICD-10-CM | POA: Diagnosis present

## 2019-08-17 ENCOUNTER — Other Ambulatory Visit (HOSPITAL_COMMUNITY): Payer: Medicare Other

## 2019-08-17 ENCOUNTER — Ambulatory Visit (INDEPENDENT_AMBULATORY_CARE_PROVIDER_SITE_OTHER): Payer: Medicare Other | Admitting: Cardiology

## 2019-08-17 DIAGNOSIS — Z7901 Long term (current) use of anticoagulants: Secondary | ICD-10-CM | POA: Diagnosis not present

## 2019-08-17 DIAGNOSIS — Z952 Presence of prosthetic heart valve: Secondary | ICD-10-CM

## 2019-08-17 DIAGNOSIS — Z5181 Encounter for therapeutic drug level monitoring: Secondary | ICD-10-CM | POA: Diagnosis not present

## 2019-08-17 LAB — POCT INR: INR: 3.1 — AB (ref 2.0–3.0)

## 2019-08-19 ENCOUNTER — Telehealth: Payer: Self-pay

## 2019-08-19 DIAGNOSIS — I701 Atherosclerosis of renal artery: Secondary | ICD-10-CM

## 2019-08-19 NOTE — Telephone Encounter (Signed)
Spoke to patient renal doppler results given.Advised to repeat in 12 months.

## 2019-08-26 ENCOUNTER — Other Ambulatory Visit: Payer: Self-pay

## 2019-08-26 ENCOUNTER — Ambulatory Visit (HOSPITAL_COMMUNITY): Payer: Medicare Other | Attending: Cardiology

## 2019-08-26 DIAGNOSIS — Z952 Presence of prosthetic heart valve: Secondary | ICD-10-CM | POA: Insufficient documentation

## 2019-08-31 ENCOUNTER — Ambulatory Visit (INDEPENDENT_AMBULATORY_CARE_PROVIDER_SITE_OTHER): Payer: Medicare Other | Admitting: Cardiovascular Disease

## 2019-08-31 DIAGNOSIS — Z5181 Encounter for therapeutic drug level monitoring: Secondary | ICD-10-CM

## 2019-08-31 DIAGNOSIS — Z952 Presence of prosthetic heart valve: Secondary | ICD-10-CM

## 2019-08-31 LAB — POCT INR: INR: 3.3 — AB (ref 2.0–3.0)

## 2019-09-09 ENCOUNTER — Encounter: Payer: Self-pay | Admitting: Internal Medicine

## 2019-09-09 ENCOUNTER — Other Ambulatory Visit: Payer: Self-pay

## 2019-09-09 ENCOUNTER — Ambulatory Visit: Payer: Medicare Other | Admitting: Internal Medicine

## 2019-09-09 DIAGNOSIS — J4520 Mild intermittent asthma, uncomplicated: Secondary | ICD-10-CM

## 2019-09-09 DIAGNOSIS — J302 Other seasonal allergic rhinitis: Secondary | ICD-10-CM

## 2019-09-09 DIAGNOSIS — J3089 Other allergic rhinitis: Secondary | ICD-10-CM | POA: Diagnosis not present

## 2019-09-09 NOTE — Assessment & Plan Note (Signed)
She blames Covid restrictions for weight gain. Encouraged now to get out more and to be careful with diet.

## 2019-09-09 NOTE — Patient Instructions (Signed)
We can continue current meds  Please call if we can help 

## 2019-09-09 NOTE — Assessment & Plan Note (Signed)
Mild uncomplicated at this time. Single puff of Wixela daily remains sufficient, with only occasional need for rescue inhaler. Meds reviewed. Plan- continue present regimen

## 2019-09-09 NOTE — Assessment & Plan Note (Signed)
Maintenance prednisone for her lupus has been keeping this controlled.  Ok to add otc meds if needed, as discussed before

## 2019-09-09 NOTE — Progress Notes (Signed)
Subjective:    Patient ID: [name], female    DOB: December 04, 1950, 69 y.o.   MRN: [id number]  HPI female never smoker followed for asthma complicated by history of lupus, aortic valve replacement/ pacemaker/ warfarin. Office spirometry 04/22/15- -------------------------------------------------------------------------------   09/08/2018- 69 year old female never smoker followed for asthma complicated by history of lupus, aortic valve replacement/ pacemaker/ warfarin. ------pt states her breathing is as basline, uses Wixela once daily & twice daily with increased SOB Prednisone 5 mg daily (for lupus), Wixela 250, albuterol hfa Satisfied with control. Discussed Wixela- asks refill. No concerns. Indicates cardiac status under control. CXR 12/21/15-  IMPRESSION: 1. Stable moderate cardiomegaly with mild central congestion. Mild perihilar interstitial opacities suggest mild edema. 2. No focal consolidation or effusion.  09/09/19- 69 year old female never smoker followed for Asthma complicated by history of lupus, aortic valve replacement/ pacemaker/ warfarin, obesity, Prednisone 5 mg daily (for lupus), Wixela 250, albuterol hfa Had 2 Phizer Covax Asthma control vey good w/o signif exacerb. Usually 1 puff Wixela daily remains sufficient. Meds reviewed. Had sinus CT for what turned out to be dental abscess- ultimately had tooth pulled.  ECHO- 08/26/19- EF 40-45%, mild PAH, RVE, Grade 2DD CXR 09/08/2018- IMPRESSION: Stable cardiomegaly. No acute abnormality noted. CT max/fac 01/12/19- IMPRESSION: 1. Mild but possibly odontogenic mucosal thickening in the left maxillary alveolar recess, note left anterior molar periapical lucency. 2. Minimal to mild mucosal thickening elsewhere, including in the left posterior ethmoid and left sphenoid sinuses. 3. Rightward nasal septal deviation and spurring.   Review of Systems- see HPI    + = positive Constitutional:   No-   weight loss, night sweats,  fevers, chills, fatigue, lassitude. HEENT:   No-  headaches, difficulty swallowing, tooth/dental problems, sore throat,       No-  sneezing, itching, +ear pressure, + controlled nasal congestion, post nasal drip,  CV:  No-   chest pain, orthopnea, PND, swelling in lower extremities, anasarca,dizziness, palpitations Resp: +shortness of breath with exertion or at rest.              No-   productive cough, + non-productive cough,  No-  coughing up of blood.              No-   change in color of mucus.  No- wheezing.   Skin: No-   rash or lesions. GI:  No-   heartburn, indigestion, abdominal pain, nausea, vomiting,  GU:  MS:  No-   joint pain or swelling.   Neuro- nothing unusual  Psych:  No- change in mood or affect. No depression or anxiety.  No memory loss.  Objective:   Physical Exam General- Alert, Oriented, Affect-appropriate, Distress- none acute;  +obese Skin- rash-none, lesions- none, excoriation- none Lymphadenopathy- none Head- atraumatic            Eyes- Gross vision intact, PERRLA, conjunctivae clear secretions            Ears- Hearing, canals normal            Nose- Clear, no-Septal dev, mucus, polyps, erosion, perforation             Throat- Mallampati III , mucosa clear , drainage- none, tonsils- atrophic Neck- flexible , trachea midline, no stridor , thyroid nl, carotid no bruit Chest - symmetrical excursion , unlabored           Heart/CV- RRR , +crisp valve sounds,  1/6 Systolic AS  Murmur, no gallop  , no rub, nl s1  s2                           - JVD+1 , edema+1 in feet, stasis changes- none, varices- none           Lung- wheeze+ trace LUL, cough- none , dullness-none, rub- none, rales-none           Chest wall- L pacer Abd-  Br/ Gen/ Rectal- Not done, not indicated Extrem- cyanosis- none, clubbing, none, atrophy- none, strength- nl,        + superficial varices Neuro- grossly intact to observation  Assessment & Plan:

## 2019-09-14 ENCOUNTER — Ambulatory Visit (INDEPENDENT_AMBULATORY_CARE_PROVIDER_SITE_OTHER): Payer: Medicare Other | Admitting: Cardiology

## 2019-09-14 DIAGNOSIS — Z5181 Encounter for therapeutic drug level monitoring: Secondary | ICD-10-CM

## 2019-09-14 DIAGNOSIS — Z952 Presence of prosthetic heart valve: Secondary | ICD-10-CM

## 2019-09-14 LAB — POCT INR: INR: 2.4 (ref 2.0–3.0)

## 2019-09-28 ENCOUNTER — Ambulatory Visit (INDEPENDENT_AMBULATORY_CARE_PROVIDER_SITE_OTHER): Payer: Medicare Other | Admitting: Cardiology

## 2019-09-28 DIAGNOSIS — Z5181 Encounter for therapeutic drug level monitoring: Secondary | ICD-10-CM | POA: Diagnosis not present

## 2019-09-28 DIAGNOSIS — Z952 Presence of prosthetic heart valve: Secondary | ICD-10-CM | POA: Diagnosis not present

## 2019-09-28 LAB — POCT INR: INR: 2.2 (ref 2.0–3.0)

## 2019-10-02 ENCOUNTER — Other Ambulatory Visit (HOSPITAL_COMMUNITY): Payer: Self-pay | Admitting: Physician Assistant

## 2019-10-02 ENCOUNTER — Telehealth (HOSPITAL_COMMUNITY): Payer: Self-pay

## 2019-10-02 ENCOUNTER — Other Ambulatory Visit (HOSPITAL_COMMUNITY): Payer: Self-pay

## 2019-10-02 NOTE — Telephone Encounter (Signed)
Contacted patients pharmacy- [facility] in [place] regarding her Tikosyn medication. I sent in her Tikosyn 236mcg capsule with quantity 180 with 1 Refill to her pharmacy. They do not have this medication currently in stock. The pharmacy stated she will have the medication in stock by 4-5pm today. She has enough medication to last her until Sunday. Consulted with patient and she verbalized understanding.

## 2019-10-12 LAB — POCT INR: INR: 2.9 (ref 2.0–3.0)

## 2019-10-13 ENCOUNTER — Ambulatory Visit: Payer: Self-pay | Admitting: Pharmacist

## 2019-10-13 DIAGNOSIS — Z5181 Encounter for therapeutic drug level monitoring: Secondary | ICD-10-CM

## 2019-10-13 DIAGNOSIS — Z952 Presence of prosthetic heart valve: Secondary | ICD-10-CM

## 2019-10-14 ENCOUNTER — Ambulatory Visit (INDEPENDENT_AMBULATORY_CARE_PROVIDER_SITE_OTHER): Payer: Medicare Other | Admitting: *Deleted

## 2019-10-14 DIAGNOSIS — I4819 Other persistent atrial fibrillation: Secondary | ICD-10-CM

## 2019-10-15 LAB — CUP PACEART REMOTE DEVICE CHECK
Battery Remaining Longevity: 119 mo
Battery Remaining Percentage: 95.5 %
Battery Voltage: 2.96 V
Brady Statistic AP VP Percent: 20 %
Brady Statistic AP VS Percent: 1 %
Brady Statistic AS VP Percent: 80 %
Brady Statistic AS VS Percent: 1 %
Brady Statistic RA Percent Paced: 19 %
Brady Statistic RV Percent Paced: 99 %
Date Time Interrogation Session: 20210825030633
Implantable Lead Implant Date: 20150116
Implantable Lead Implant Date: 20150116
Implantable Lead Location: 753859
Implantable Lead Location: 753860
Implantable Lead Model: 5076
Implantable Pulse Generator Implant Date: 20150116
Lead Channel Impedance Value: 360 Ohm
Lead Channel Impedance Value: 530 Ohm
Lead Channel Pacing Threshold Amplitude: 0.75 V
Lead Channel Pacing Threshold Amplitude: 0.75 V
Lead Channel Pacing Threshold Pulse Width: 0.4 ms
Lead Channel Pacing Threshold Pulse Width: 0.4 ms
Lead Channel Sensing Intrinsic Amplitude: 12 mV
Lead Channel Sensing Intrinsic Amplitude: 3.8 mV
Lead Channel Setting Pacing Amplitude: 1 V
Lead Channel Setting Pacing Amplitude: 2 V
Lead Channel Setting Pacing Pulse Width: 0.4 ms
Lead Channel Setting Sensing Sensitivity: 2 mV
Pulse Gen Model: 2240
Pulse Gen Serial Number: 7587004

## 2019-10-19 NOTE — Progress Notes (Signed)
Remote pacemaker transmission.   

## 2019-10-28 ENCOUNTER — Ambulatory Visit (HOSPITAL_COMMUNITY)
Admission: RE | Admit: 2019-10-28 | Discharge: 2019-10-28 | Disposition: A | Discharge status: Home / Self Care | Payer: Medicare Other | Source: Ambulatory Visit | Attending: Physician Assistant | Admitting: Physician Assistant

## 2019-10-28 ENCOUNTER — Encounter (HOSPITAL_COMMUNITY): Payer: Self-pay | Admitting: Physician Assistant

## 2019-10-28 ENCOUNTER — Other Ambulatory Visit: Payer: Self-pay

## 2019-10-28 VITALS — BP 140/78 | HR 70 | Ht 65.0 in | Wt 267.4 lb

## 2019-10-28 DIAGNOSIS — E669 Obesity, unspecified: Secondary | ICD-10-CM | POA: Diagnosis not present

## 2019-10-28 DIAGNOSIS — Z6841 Body Mass Index (BMI) 40.0 and over, adult: Secondary | ICD-10-CM | POA: Diagnosis not present

## 2019-10-28 DIAGNOSIS — Z950 Presence of cardiac pacemaker: Secondary | ICD-10-CM | POA: Diagnosis not present

## 2019-10-28 DIAGNOSIS — I442 Atrioventricular block, complete: Secondary | ICD-10-CM | POA: Insufficient documentation

## 2019-10-28 DIAGNOSIS — I359 Nonrheumatic aortic valve disorder, unspecified: Secondary | ICD-10-CM | POA: Insufficient documentation

## 2019-10-28 DIAGNOSIS — I701 Atherosclerosis of renal artery: Secondary | ICD-10-CM | POA: Diagnosis not present

## 2019-10-28 DIAGNOSIS — Z7901 Long term (current) use of anticoagulants: Secondary | ICD-10-CM | POA: Diagnosis not present

## 2019-10-28 DIAGNOSIS — I2510 Atherosclerotic heart disease of native coronary artery without angina pectoris: Secondary | ICD-10-CM | POA: Insufficient documentation

## 2019-10-28 DIAGNOSIS — I10 Essential (primary) hypertension: Secondary | ICD-10-CM | POA: Insufficient documentation

## 2019-10-28 DIAGNOSIS — I4819 Other persistent atrial fibrillation: Secondary | ICD-10-CM

## 2019-10-28 DIAGNOSIS — Z888 Allergy status to other drugs, medicaments and biological substances status: Secondary | ICD-10-CM | POA: Diagnosis not present

## 2019-10-28 DIAGNOSIS — Z952 Presence of prosthetic heart valve: Secondary | ICD-10-CM | POA: Diagnosis not present

## 2019-10-28 DIAGNOSIS — I129 Hypertensive chronic kidney disease with stage 1 through stage 4 chronic kidney disease, or unspecified chronic kidney disease: Secondary | ICD-10-CM | POA: Insufficient documentation

## 2019-10-28 DIAGNOSIS — Z79899 Other long term (current) drug therapy: Secondary | ICD-10-CM | POA: Diagnosis not present

## 2019-10-28 DIAGNOSIS — M329 Systemic lupus erythematosus, unspecified: Secondary | ICD-10-CM | POA: Diagnosis not present

## 2019-10-28 DIAGNOSIS — D6869 Other thrombophilia: Secondary | ICD-10-CM

## 2019-10-28 DIAGNOSIS — N189 Chronic kidney disease, unspecified: Secondary | ICD-10-CM | POA: Insufficient documentation

## 2019-10-28 DIAGNOSIS — I739 Peripheral vascular disease, unspecified: Secondary | ICD-10-CM | POA: Diagnosis not present

## 2019-10-28 LAB — BASIC METABOLIC PANEL
Anion gap: 9 (ref 5–15)
BUN: 27 mg/dL — ABNORMAL HIGH (ref 8–23)
CO2: 28 mmol/L (ref 22–32)
Calcium: 9.7 mg/dL (ref 8.9–10.3)
Chloride: 103 mmol/L (ref 98–111)
Creatinine, Ser: 1.36 mg/dL — ABNORMAL HIGH (ref 0.44–1.00)
GFR calc Af Amer: 47 mL/min — ABNORMAL LOW (ref >60)
GFR calc non Af Amer: 40 mL/min — ABNORMAL LOW (ref >60)
Glucose, Bld: 88 mg/dL (ref 70–99)
Potassium: 4.6 mmol/L (ref 3.5–5.1)
Sodium: 140 mmol/L (ref 135–145)

## 2019-10-28 LAB — MAGNESIUM: Magnesium: 2 mg/dL (ref 1.7–2.4)

## 2019-10-28 LAB — POCT INR: INR: 2.3 (ref 2.0–3.0)

## 2019-10-28 MED ORDER — DOFETILIDE 250 MCG PO CAPS: 250.0000 ug | ORAL_CAPSULE | Freq: Two times a day (BID) | ORAL | 2 refills | Status: DC

## 2019-10-28 NOTE — Progress Notes (Signed)
Primary Care Physician: [name], MD Primary Cardiologist: Dr Gwenlyn Found Primary EP: Dr Rayann Heman Referring Physician:Dr. Rayann Heman   [name] is a 69 y.o. female with a h/o PPM,CAD, mechanical  AVR on warfarin, persistent afib for the last 30 days per [facility] device alert brought to the attention of [facility], NP/ Dr. Rayann Heman. He feels that is is worth a try to try pt on tikosyn as she is having more issues with edema and shortness of breath in the last several weeks.  He is concerned with her obesity and severe TR that the results may be marginal. The pt is informed of dofetilide admission and what is to be expected with hospitalization for same. She is interested in pursing. EKG shows v paced today at 76 bpm. She is on warfarin with a CHADS2VASC score of 4. Patient is s/p dofetilide loading 03/2018.  On follow up today, patient reports that she has done well since her last visit. Her device has not shown any recent afib. She denies any bleeding issues on anticoagulation.   Today, she denies symptoms of palpitations, chest pain, shortness of breath, orthopnea, PND, lower extremity edema, dizziness, presyncope, syncope, or neurologic sequela. The patient is tolerating medications without difficulties and is otherwise without complaint today.   Past Medical History:  Diagnosis Date   Antiphospholipid antibody positive 06/28/2017   Asthma    CAD (coronary artery disease)    a. normal cors by cath in 03/2015   Carotid artery disease ([place])    Chronic anticoagulation    Complete heart block ([place]) 02/2013   a. Implantation of a dual chamber pacemaker in 02/2013 by Dr Rayann Heman. STJ model number 9323 pacemaker with model number [id number] right ventricular lead.    Dyslipidemia    H/O Doppler ultrasound 10/03/2010   carotid duplex - R ICA normal patency; L CCA-ICA bypass gradt patent common to interal cartoid bypass graft    H/O mechanical aortic valve replacement    History of echocardiogram  11/08/2011   EF >55%; mild-mod concentric LVH; trace aortic regurg.; LA mild-mod dilated   History of nuclear stress test 01/20/2010   normal pattern of perfusion; low risk scan   Hypertension    Lupus (systemic lupus erythematosus) (HCC)    PVD (peripheral vascular disease) (H[facility])    60% R renal artery stenosis; non-functioning L kidney   Past Surgical History:  Procedure Laterality Date   AORTIC VALVE REPLACEMENT  11/29/006   [place]; [employer]; on coumadin   CARDIAC CATHETERIZATION  11/01/2004   define anatomy    CARDIAC CATHETERIZATION N/A 04/19/2015   Procedure: Coronary Angiography;  Surgeon: Peter M [name], MD;  Location: [facility];  Service: Cardiovascular;  Laterality: N/A;   CAROTID ENDARTERECTOMY     lt.   CHOLECYSTECTOMY  1999   EXPLORATORY LAPAROTOMY  04/12/2012   small bowel perf; ileostomy & R hemicolectomy    PACEMAKER INSERTION  03/06/2013   SJM Assurity DR PPM implanted by Dr Rayann Heman for complete heart block   PERMANENT PACEMAKER INSERTION N/A 03/06/2013   Procedure: PERMANENT PACEMAKER INSERTION;  Surgeon: Coralyn Mark, MD;  Location: [facility];  Service: Cardiovascular;  Laterality: N/A;   TEMPORARY PACEMAKER INSERTION N/A 03/06/2013   Procedure: TEMPORARY PACEMAKER INSERTION;  Surgeon: Coralyn Mark, MD;  Location: [facility];  Service: Cardiovascular;  Laterality: N/A;   TUBAL LIGATION  11/1982    Current Outpatient Medications  Medication Sig Dispense Refill   acetaminophen (TYLENOL) 325 MG  tablet Take 650 mg by mouth every 6 (six) hours as needed for moderate pain.      albuterol (PROVENTIL HFA;VENTOLIN HFA) 108 (90 Base) MCG/ACT inhaler Inhale 2 puffs into the lungs every 6 (six) hours as needed for wheezing or shortness of breath. 3 Inhaler 1   allopurinol (ZYLOPRIM) 100 MG tablet Take 100 mg by mouth daily.      amoxicillin (AMOXIL) 500 MG capsule TK 4 CS PO ONE HOUR BEFORE APPT     Calcium Carbonate (CALTRATE 600) 1500 MG TABS  Take 1 tablet by mouth 2 (two) times daily.       cetirizine (ZYRTEC) 10 MG tablet Take 10 mg by mouth at bedtime.     chlorhexidine (PERIDEX) 0.12 % solution Use as directed 10 mLs in the mouth or throat 2 (two) times daily as needed (before dental appts).   5   cholecalciferol (VITAMIN D) 1000 UNITS tablet Take 2,000 Units by mouth 2 (two) times daily.      clotrimazole-betamethasone (LOTRISONE) cream as needed.     colestipol (COLESTID) 1 g tablet Take 1 g by mouth daily.     dicyclomine (BENTYL) 20 MG tablet Take by mouth.     dofetilide (TIKOSYN) 250 MCG capsule Take 1 capsule (250 mcg total) by mouth 2 (two) times daily. 180 capsule 2   Fluticasone-Salmeterol (WIXELA INHUB) 250-50 MCG/DOSE AEPB Inhale 1 puff into the lungs daily. 180 each 3   furosemide (LASIX) 20 MG tablet Take 1 tablet (20 mg total) by mouth daily. 30 tablet 6   hydroxychloroquine (PLAQUENIL) 200 MG tablet Take 200 mg by mouth daily.     isosorbide mononitrate (IMDUR) 30 MG 24 hr tablet TAKE 1 TABLET BY MOUTH  DAILY 90 tablet 3   leflunomide (ARAVA) 10 MG tablet Take 10 mg by mouth daily.     loratadine (CLARITIN) 10 MG tablet Take 10 mg by mouth daily.     Magnesium 200 MG TABS Take 1 tablet (200 mg total) by mouth daily. 30 each    metoprolol succinate (TOPROL-XL) 50 MG 24 hr tablet TAKE 1 TABLET BY MOUTH  DAILY WITH OR IMMEDIATELY  FOLLOWING A MEAL 90 tablet 3   potassium chloride SA (KLOR-CON) 20 MEQ tablet TAKE 1  BY MOUTH TWICE DAILY 60 tablet 6   predniSONE (DELTASONE) 5 MG tablet Take 5 mg by mouth daily.       Probiotic Product (PROBIOTIC DAILY PO) Take 1 tablet by mouth daily.     simvastatin (ZOCOR) 20 MG tablet Take 1 tablet by mouth at  bedtime 90 tablet 0   warfarin (COUMADIN) 5 MG tablet TAKE 1 TO 1 AND 1/2 TABLETS BY MOUTH DAILY AS DIRECTED  BY COUMADIN CLINIC 135 tablet 1   No current facility-administered medications for this encounter.    Allergies  Allergen Reactions    Spironolactone Other (See Comments)    Hyperkalemia    Social History   Socioeconomic History   Marital status: Divorced    Spouse name: Not on file   Number of children: 3   Years of education: Not on file   Highest education level: Not on file  Occupational History   Occupation: Retired  Tobacco Use   Smoking status: Never Smoker   Smokeless tobacco: Never Used  Scientific laboratory technician Use: Never used  Substance and Sexual Activity   Alcohol use: No   Drug use: No   Sexual activity: Not on file  Other Topics Concern  Not on file  Social History Narrative   Lives alone in [place].   Social Determinants of Health   Financial Resource Strain:    Difficulty of Paying Living Expenses: Not on file  Food Insecurity:    Worried About Charity fundraiser in the Last Year: Not on file   [employer] of Food in the Last Year: Not on file  Transportation Needs:    Lack of Transportation (Medical): Not on file   Lack of Transportation (Non-Medical): Not on file  Physical Activity:    Days of Exercise per Week: Not on file   Minutes of Exercise per Session: Not on file  Stress:    Feeling of Stress : Not on file  Social Connections:    Frequency of Communication with Friends and Family: Not on file   Frequency of Social Gatherings with Friends and Family: Not on file   Attends Religious Services: Not on file   Active Member of [place] or Organizations: Not on file   Attends Archivist Meetings: Not on file   Marital Status: Not on file  Intimate Partner Violence:    Fear of Current or Ex-Partner: Not on file   Emotionally Abused: Not on file   Physically Abused: Not on file   Sexually Abused: Not on file    Family History  Problem Relation Age of Onset   Alzheimer's disease Father    Cirrhosis Father    Cancer Father        prostate   Heart attack Neg Hx     ROS- All systems are reviewed and negative except as per the HPI  above  Physical Exam: Vitals:   10/28/19 1334  BP: 140/78  Pulse: 70  Weight: 121.3 kg  Height: 5\' 5"  (1.651 m)   Wt Readings from Last 3 Encounters:  10/28/19 121.3 kg  09/09/19 123.5 kg  04/08/19 120.2 kg    Labs: Lab Results  Component Value Date   NA 139 04/08/2019   K 4.5 04/08/2019   CL 100 04/08/2019   CO2 28 04/08/2019   GLUCOSE 83 04/08/2019   BUN 26 (H) 04/08/2019   CREATININE 1.36 (H) 04/08/2019   CALCIUM 9.6 04/08/2019   MG 1.9 04/08/2019   Lab Results  Component Value Date   INR 2.9 10/12/2019   Lab Results  Component Value Date   CHOL 157 06/02/2014   HDL 101 06/02/2014   LDLCALC 40 06/02/2014   TRIG 78 06/02/2014    GEN- The patient is well appearing obese female, alert and oriented x 3 today.   HEENT-head normocephalic, atraumatic, sclera clear, conjunctiva pink, hearing intact, trachea midline. Lungs- Clear to ausculation bilaterally, normal work of breathing Heart- Regular rate and rhythm, no murmurs, rubs or gallops  GI- soft, NT, ND, + BS Extremities- no clubbing, cyanosis, or edema MS- no significant deformity or atrophy Skin- no rash or lesion Psych- euthymic mood, full affect Neuro- strength and sensation are intact   EKG- A sense V pace rhythm HR 70, PR 226, QRS 184, QTc 498  Echo-Study Conclusions  - Left ventricle: The cavity size was mildly dilated. Wall   thickness was increased in a pattern of mild LVH. Marked   septal-lateral dyssynchrony with septal hypokinesis. The   estimated ejection fraction was 45%. Doppler parameters are   consistent with abnormal left ventricular relaxation (grade 1   diastolic dysfunction). - Aortic valve: Mechanical aortic valve. Mean gradient not   significantly elevated across the aortic  valve. There was trivial   regurgitation. Mean gradient (S): 16 mm Hg. - Mitral valve: Mildly calcified annulus. There was moderate   regurgitation. - Left atrium: The atrium was mildly dilated. - Right  ventricle: The cavity size was moderately dilated. Pacer   wire or catheter noted in right ventricle. Systolic function was   normal. - Right atrium: The atrium was moderately dilated. - Tricuspid valve: There was severe regurgitation with incomplete   coaptation of the tricuspid leaflets. Peak RV-RA gradient (S): 27   mm Hg. - Pulmonary arteries: PA peak pressure: 42 mm Hg (S). - Systemic veins: IVC measured 2.8 cm with < 50% respirophasic   variation, suggesting RA pressure 15 mmHg.  Impressions:  - Mildly dilated LV with EF 45%. Septal-lateral dyssynchrony with   septal hypokinesis. Moderately dilated RV with normal systolic   function. Moderate mitral regurgitation. Severe tricuspid   regurgitation with incomplete coaptation of tricuspid leaflets.   Mechanical aortic valve appeared to function normally. Mild   pulmonary hypertension.   Assessment and Plan: 1. Persistent atrial fibrillation   Patient maintaining SR with no new afib detected on device.  Continue dofetilide 250 mcg BID. QT stable.  Check bmet/mag today. Continue warfarin  This patients CHA2DS2-VASc Score and unadjusted Ischemic Stroke Rate (% per year) is equal to 4.8 % stroke rate/year from a score of 4  Above score calculated as 1 point each if present [CHF, HTN, DM, Vascular=MI/PAD/Aortic Plaque, Age if 65-74, or Female] Above score calculated as 2 points each if present [Age > 75, or Stroke/TIA/TE]  2. Complete heart block S/p PPM, followed by Dr Rayann Heman and the device clinic.   3. Aortic valve disease S/p AVR with mechanical valve. Followed by Dr Gwenlyn Found.  4. HTN Stable, no changes today.  5. Obesity Body mass index is 44.5 kg/m. Lifestyle modification was discussed and encouraged including regular physical activity and weight reduction.   Follow up with Dr Rayann Heman and Dr Gwenlyn Found in 3-6 months. AF clinic 9 months.   [facility] [address] [place], [place] [address] [phone]

## 2019-10-29 ENCOUNTER — Ambulatory Visit (INDEPENDENT_AMBULATORY_CARE_PROVIDER_SITE_OTHER): Payer: Medicare Other | Admitting: Cardiovascular Disease

## 2019-10-29 DIAGNOSIS — Z952 Presence of prosthetic heart valve: Secondary | ICD-10-CM | POA: Diagnosis not present

## 2019-10-29 DIAGNOSIS — Z5181 Encounter for therapeutic drug level monitoring: Secondary | ICD-10-CM

## 2019-11-02 ENCOUNTER — Other Ambulatory Visit: Payer: Self-pay | Admitting: Cardiovascular Disease

## 2019-11-02 DIAGNOSIS — I4729 Other ventricular tachycardia: Secondary | ICD-10-CM

## 2019-11-02 DIAGNOSIS — I472 Ventricular tachycardia: Secondary | ICD-10-CM

## 2019-11-03 NOTE — Telephone Encounter (Signed)
Called and spoke to patient and she stated she does not need a refill on her isosorbide or metoprolol succinate right now. She stated she has bottles of 90 day supplies she has not even opened yet of each medication from [facility]. She said to cancel the refills because she did not ask for them and does not need them at this time.

## 2019-11-09 ENCOUNTER — Other Ambulatory Visit: Payer: Self-pay | Admitting: Internal Medicine

## 2019-11-09 ENCOUNTER — Other Ambulatory Visit: Payer: Self-pay | Admitting: Cardiovascular Disease

## 2019-11-09 DIAGNOSIS — I4729 Other ventricular tachycardia: Secondary | ICD-10-CM

## 2019-11-09 DIAGNOSIS — I472 Ventricular tachycardia: Secondary | ICD-10-CM

## 2019-11-11 ENCOUNTER — Encounter: Payer: Medicare Other | Admitting: Internal Medicine

## 2019-11-13 ENCOUNTER — Ambulatory Visit (INDEPENDENT_AMBULATORY_CARE_PROVIDER_SITE_OTHER): Payer: Medicare Other | Admitting: Cardiovascular Disease

## 2019-11-13 DIAGNOSIS — Z952 Presence of prosthetic heart valve: Secondary | ICD-10-CM | POA: Diagnosis not present

## 2019-11-13 DIAGNOSIS — Z5181 Encounter for therapeutic drug level monitoring: Secondary | ICD-10-CM | POA: Diagnosis not present

## 2019-11-13 LAB — POCT INR: INR: 2.2 (ref 2.0–3.0)

## 2019-11-18 ENCOUNTER — Other Ambulatory Visit: Payer: Self-pay | Admitting: Cardiovascular Disease

## 2019-11-18 DIAGNOSIS — I472 Ventricular tachycardia: Secondary | ICD-10-CM

## 2019-11-18 DIAGNOSIS — I4729 Other ventricular tachycardia: Secondary | ICD-10-CM

## 2019-11-26 LAB — POCT INR: INR: 2.2 (ref 2.0–3.0)

## 2019-11-27 ENCOUNTER — Ambulatory Visit (INDEPENDENT_AMBULATORY_CARE_PROVIDER_SITE_OTHER): Payer: Medicare Other | Admitting: Cardiovascular Disease

## 2019-11-27 DIAGNOSIS — Z952 Presence of prosthetic heart valve: Secondary | ICD-10-CM | POA: Diagnosis not present

## 2019-11-27 DIAGNOSIS — Z5181 Encounter for therapeutic drug level monitoring: Secondary | ICD-10-CM

## 2019-12-02 ENCOUNTER — Encounter: Payer: Self-pay | Admitting: Cardiovascular Disease

## 2019-12-02 ENCOUNTER — Ambulatory Visit: Payer: Medicare Other | Admitting: Cardiovascular Disease

## 2019-12-02 ENCOUNTER — Other Ambulatory Visit: Payer: Self-pay

## 2019-12-02 VITALS — BP 140/94 | HR 76 | Ht 65.0 in | Wt 269.0 lb

## 2019-12-02 DIAGNOSIS — I442 Atrioventricular block, complete: Secondary | ICD-10-CM

## 2019-12-02 DIAGNOSIS — E782 Mixed hyperlipidemia: Secondary | ICD-10-CM | POA: Diagnosis not present

## 2019-12-02 DIAGNOSIS — Z79899 Other long term (current) drug therapy: Secondary | ICD-10-CM

## 2019-12-02 DIAGNOSIS — I10 Essential (primary) hypertension: Secondary | ICD-10-CM

## 2019-12-02 DIAGNOSIS — I5043 Acute on chronic combined systolic (congestive) and diastolic (congestive) heart failure: Secondary | ICD-10-CM

## 2019-12-02 DIAGNOSIS — I480 Paroxysmal atrial fibrillation: Secondary | ICD-10-CM

## 2019-12-02 DIAGNOSIS — Z952 Presence of prosthetic heart valve: Secondary | ICD-10-CM | POA: Diagnosis not present

## 2019-12-02 DIAGNOSIS — I701 Atherosclerosis of renal artery: Secondary | ICD-10-CM

## 2019-12-02 LAB — LIPID PANEL
Chol/HDL Ratio: 1.8 ratio (ref 0.0–4.4)
Cholesterol, Total: 174 mg/dL (ref 100–199)
HDL: 99 mg/dL (ref >39)
LDL Chol Calc (NIH): 54 mg/dL (ref 0–99)
Triglycerides: 124 mg/dL (ref 0–149)
VLDL Cholesterol Cal: 21 mg/dL (ref 5–40)

## 2019-12-02 LAB — HEPATIC FUNCTION PANEL
ALT: 26 IU/L (ref 0–32)
AST: 25 IU/L (ref 0–40)
Albumin: 4.1 g/dL (ref 3.8–4.8)
Alkaline Phosphatase: 91 IU/L (ref 44–121)
Bilirubin Total: 0.3 mg/dL (ref 0.0–1.2)
Bilirubin, Direct: 0.11 mg/dL (ref 0.00–0.40)
Total Protein: 6.8 g/dL (ref 6.0–8.5)

## 2019-12-02 NOTE — Assessment & Plan Note (Signed)
History of essential hypertension a blood pressure measured today at 140/94.  She is on metoprolol.

## 2019-12-02 NOTE — Assessment & Plan Note (Signed)
History of paroxysmal atrial fibrillation status post dofetilide loading 2/20 maintaining sinus rhythm on Tikosyn and warfarin anticoagulation.

## 2019-12-02 NOTE — Assessment & Plan Note (Signed)
Status post [place] AVR by Dr. [name] September 2006 because of severe aortic insufficiency.  Her coronaries at the time were normal.  Her most recent 2D echo performed 08/26/2019 revealed ejection fraction of 40 to 45% with a well-functioning aortic mechanical prosthesis.  We will recheck this in 12 months.

## 2019-12-02 NOTE — Patient Instructions (Signed)
Medication Instructions:  The current medical regimen is effective;  continue present plan and medications as directed. Please refer to the Current Medication list given to you today. *If you need a refill on your cardiac medications before your next appointment, please call your pharmacy*  Lab Work: NONE If you have labs (blood work) drawn today and your tests are completely normal, you will receive your results only by:  [place] (if you have MyChart) OR A paper copy in the mail.  If you have any lab test that is abnormal or we need to change your treatment, we will call you to review the results. You may go to any [facility] that is convenient for you however, we do have a lab in our office that is able to assist you. You DO NOT need an appointment for our lab. The lab is open 8:00am and closes at 4:00pm. Lunch 12:45 - 1:45pm.  Testing/Procedures: Echocardiogram(JULY) - Your physician has requested that you have an echocardiogram. Echocardiography is a painless test that uses sound waves to create images of your heart. It provides your doctor with information about the size and shape of your heart and how well your heart's chambers and valves are working. This procedure takes approximately one hour. There are no restrictions for this procedure. This will be performed at our [facility] location - [address].  Your physician has requested that you have a renal artery duplex(JUNE). During this test, an ultrasound is used to evaluate blood flow to the kidneys. Allow one hour for this exam. Do not eat after midnight the day before and avoid carbonated beverages. Take your medications as you usually do.  Your physician has requested that you have a carotid duplex(JUNE). This test is an ultrasound of the carotid arteries in your neck. It looks at blood flow through these arteries that supply the brain with blood. Allow one hour for this exam. There are no restrictions or special  instructions.  Follow-Up: Your next appointment:  1 year(s) In Person with Quay Burow, MDPlease call our office 2 months in advance to schedule this appointment   At [facility], you and your health needs are our priority.  As part of our continuing mission to provide you with exceptional heart care, we have created designated Provider Care Teams.  These Care Teams include your primary Cardiologist (physician) and Advanced Practice Providers (APPs -  Physician Assistants and Nurse Practitioners) who all work together to provide you with the care you need, when you need it.  We recommend signing up for the patient portal called "MyChart".  Sign up information is provided on this After Visit Summary.  MyChart is used to connect with patients for Virtual Visits (Telemedicine).  Patients are able to view lab/test results, encounter notes, upcoming appointments, etc.  Non-urgent messages can be sent to your provider as well.   To learn more about what you can do with MyChart, go to [email or web address].

## 2019-12-02 NOTE — Assessment & Plan Note (Signed)
History of nonischemic card cardiomyopathy with ejection fraction in the 40 to 45% range on appropriate medications and relatively asymptomatic.

## 2019-12-02 NOTE — Assessment & Plan Note (Signed)
History of nonfunctioning left kidney with renal Doppler study performed 08/13/2019 revealing a widely patent right renal artery.  This will be repeated in 12 months

## 2019-12-02 NOTE — Assessment & Plan Note (Signed)
History of complete heart block status post permanent transvenous pacemaker insertion by Dr. Rayann Heman January 2015 which he follows regularly.

## 2019-12-02 NOTE — Assessment & Plan Note (Signed)
History of hyperlipidemia on statin therapy.  We will recheck a lipid liver profile today. 

## 2019-12-02 NOTE — Assessment & Plan Note (Signed)
History of known left carotid occlusion with a widely patent right internal carotid artery by duplex 07/28/2019.  This will be repeated on annual basis.

## 2019-12-02 NOTE — Progress Notes (Signed)
12/02/2019 [name]   1950-12-17  [id number]  Primary Physician [name], MD Primary Cardiologist: [name] MD FACP, [place], [place], [name]  HPI:  [name] is a 69 y.o.  moderately overweight, almost divorced Caucasian female mother of 81, grandmother to 3 grandchildren who I last spoke to on the phone for a virtual telemedicine phone visit 07/30/2018... She had a St. Jude AVR performed by Dr. Ceasar Mons September of 2006 because of severe aortic insufficiency. She had normal coronary arteries by cath at that time. A look at her renal arteries and demonstrated a 60% right renal artery stenosis and patent accessory left renal arteries. By duplex she does have a non-functioning left kidney. Her other problems include hypertension and hyperlipidemia. She also has a history of lupus followed by Dr. Jalene Mullet. An echo performed a year ago showed a well-functioning aortic prosthesis, and recent renal Dopplers revealed a widely patent right renal artery. She had a small bowel obstruction and underwent surgery at [facility] for perforated small bowel. She had an exploratory laparotomy with ileostomy and a right hemicolectomy 04/12/12. She saw [name] PAC back in our office 06/12/12 and her medicines were adjusted.   She was admitted in early January for symptomatic complete heart block underwent permanent pacemaker insertion by Dr. Thompson Grayer.[name] She was remitted approximately week later with orthostatic hypotension and was dehydrated. She responded to fluid resuscitation.  Since I saw her a year ago she was admitted with heart failure requiring diuresis secondary to diastolic dysfunction and dietary indiscretion. In addition, she recently underwent an cardiac catheterization by Dr. [name] because of chest pain 04/19/15 revealing normal coronary arteries. She was admitted to [facility] of volume overload 12/21/15 and was diuresed   She was complaining of increasing  dyspnea. She was found to be in A. fib and was admitted for Tikosyn load on 04/01/2018. She converted to sinus rhythm and feels clinically improved.  She  continues to do well ostensibly maintaining sinus rhythm on Tikosyn and warfarin.  She did see Dr. Rayann Heman back who confirmed continued improvement.  She does have diastolic heart failure on diuretics with grade 1 diastolic dysfunction on 2D echo 05/24/2017.  Recent carotid and renal Doppler studies showed an occluded left renal artery with a widely patent right renal artery and an occluded left internal carotid with a widely patent right.  Since I spoke to her over a year ago she continues to do well.  She is followed in the A. fib clinic extensively maintaining sinus rhythm after her Tikosyn load.  Her most recent 2D echo performed 08/26/2019 revealed an EF of 40% with a well-functioning aortic mechanical prosthesis on Coumadin anticoagulation.  She is asymptomatic.   Current Meds  Medication Sig  . acetaminophen (TYLENOL) 325 MG tablet Take 650 mg by mouth every 6 (six) hours as needed for moderate pain.   [name] albuterol (PROVENTIL HFA;VENTOLIN HFA) 108 (90 Base) MCG/ACT inhaler Inhale 2 puffs into the lungs every 6 (six) hours as needed for wheezing or shortness of breath.  . allopurinol (ZYLOPRIM) 100 MG tablet Take 100 mg by mouth daily.   [name] amoxicillin (AMOXIL) 500 MG capsule TK 4 CS PO ONE HOUR BEFORE APPT  . Calcium Carbonate (CALTRATE 600) 1500 MG TABS Take 1 tablet by mouth 2 (two) times daily.    . cetirizine (ZYRTEC) 10 MG tablet Take 10 mg by mouth at bedtime.  . chlorhexidine (PERIDEX) 0.12 % solution Use as directed 10  mLs in the mouth or throat 2 (two) times daily as needed (before dental appts).   . cholecalciferol (VITAMIN D) 1000 UNITS tablet Take 2,000 Units by mouth 2 (two) times daily.   . clotrimazole-betamethasone (LOTRISONE) cream as needed.  . colestipol (COLESTID) 1 g tablet Take 1 g by mouth daily.  [name] dicyclomine (BENTYL)  20 MG tablet Take by mouth.  . dofetilide (TIKOSYN) 250 MCG capsule Take 1 capsule (250 mcg total) by mouth 2 (two) times daily.  . Fluticasone-Salmeterol (WIXELA INHUB) 250-50 MCG/DOSE AEPB Inhale 1 puff into the lungs daily.  . furosemide (LASIX) 20 MG tablet Take 1 tablet (20 mg total) by mouth daily.  . hydroxychloroquine (PLAQUENIL) 200 MG tablet Take 200 mg by mouth daily.  . isosorbide mononitrate (IMDUR) 30 MG 24 hr tablet TAKE 1 TABLET BY MOUTH  DAILY  . leflunomide (ARAVA) 10 MG tablet Take 10 mg by mouth daily.  [name] loratadine (CLARITIN) 10 MG tablet Take 10 mg by mouth daily.  . Magnesium 200 MG TABS Take 1 tablet (200 mg total) by mouth daily.  . metoprolol succinate (TOPROL-XL) 50 MG 24 hr tablet TAKE 1 TABLET BY MOUTH  DAILY WITH OR IMMEDIATELY  FOLLOWING A MEAL  . potassium chloride SA (KLOR-CON) 20 MEQ tablet TAKE 1  BY MOUTH TWICE DAILY  . predniSONE (DELTASONE) 5 MG tablet Take 5 mg by mouth daily.    . Probiotic Product (PROBIOTIC DAILY PO) Take 1 tablet by mouth daily.  . simvastatin (ZOCOR) 20 MG tablet Take 1 tablet by mouth at  bedtime  . warfarin (COUMADIN) 5 MG tablet TAKE 1 TO 1 AND 1/2 TABLETS BY MOUTH DAILY AS DIRECTED  BY COUMADIN CLINIC     Allergies  Allergen Reactions  . Spironolactone Other (See Comments)    Hyperkalemia    Social History   Socioeconomic History  . Marital status: Divorced    Spouse name: Not on file  . Number of children: 3  . Years of education: Not on file  . Highest education level: Not on file  Occupational History  . Occupation: Retired  Tobacco Use  . Smoking status: Never Smoker  . Smokeless tobacco: Never Used  Vaping Use  . Vaping Use: Never used  Substance and Sexual Activity  . Alcohol use: No  . Drug use: No  . Sexual activity: Not on file  Other Topics Concern  . Not on file  Social History Narrative   Lives alone in [place].   Social Determinants of Health   Financial Resource Strain:   . Difficulty  of Paying Living Expenses: Not on file  Food Insecurity:   . Worried About Charity fundraiser in the Last Year: Not on file  . Ran Out of Food in the Last Year: Not on file  Transportation Needs:   . Lack of Transportation (Medical): Not on file  . Lack of Transportation (Non-Medical): Not on file  Physical Activity:   . Days of Exercise per Week: Not on file  . Minutes of Exercise per Session: Not on file  Stress:   . Feeling of Stress : Not on file  Social Connections:   . Frequency of Communication with Friends and Family: Not on file  . Frequency of Social Gatherings with Friends and Family: Not on file  . Attends Religious Services: Not on file  . Active Member of Clubs or Organizations: Not on file  . Attends Archivist Meetings: Not on file  . Marital  Status: Not on file  Intimate Partner Violence:   . Fear of Current or Ex-Partner: Not on file  . Emotionally Abused: Not on file  . Physically Abused: Not on file  . Sexually Abused: Not on file     Review of Systems: General: negative for chills, fever, night sweats or weight changes.  Cardiovascular: negative for chest pain, dyspnea on exertion, edema, orthopnea, palpitations, paroxysmal nocturnal dyspnea or shortness of breath Dermatological: negative for rash Respiratory: negative for cough or wheezing Urologic: negative for hematuria Abdominal: negative for nausea, vomiting, diarrhea, bright red blood per rectum, melena, or hematemesis Neurologic: negative for visual changes, syncope, or dizziness All other systems reviewed and are otherwise negative except as noted above.    Blood pressure (!) 140/94, pulse 76, height 5\' 5"  (1.651 m), weight 269 lb (122 kg), SpO2 94 %.  General appearance: alert and no distress Neck: no adenopathy, no carotid bruit, no JVD, supple, symmetrical, trachea midline and thyroid not enlarged, symmetric, no tenderness/mass/nodules Lungs: clear to auscultation bilaterally Heart:  Crisp prosthetic aortic valve sounds Extremities: extremities normal, atraumatic, no cyanosis or edema Pulses: 2+ and symmetric Skin: Skin color, texture, turgor normal. No rashes or lesions Neurologic: Alert and oriented X 3, normal strength and tone. Normal symmetric reflexes. Normal coordination and gait  EKG atrially sensed ventricularly paced rhythm at 76.  Underlying rhythm appears to be sinus.  I personally reviewed this EKG  ASSESSMENT AND PLAN:   Complete heart block (HCC) History of complete heart block status post permanent transvenous pacemaker insertion by Dr. Rayann Heman January 2015 which he follows regularly.  S/P AVR (aortic valve replacement) Status post Saint Jude AVR by Dr. [name] September 2006 because of severe aortic insufficiency.  Her coronaries at the time were normal.  Her most recent 2D echo performed 08/26/2019 revealed ejection fraction of 40 to 45% with a well-functioning aortic mechanical prosthesis.  We will recheck this in 12 months.  Hyperlipidemia History of hyperlipidemia on statin therapy.  We will recheck a lipid liver profile today.  Essential hypertension History of essential hypertension a blood pressure measured today at 140/94.  She is on metoprolol.  Renal artery stenosis (HCC) History of nonfunctioning left kidney with renal Doppler study performed 08/13/2019 revealing a widely patent right renal artery.  This will be repeated in 12 months  Carotid stenosis History of known left carotid occlusion with a widely patent right internal carotid artery by duplex 07/28/2019.  This will be repeated on annual basis.  Acute on chronic combined systolic and diastolic CHF (congestive heart failure) ([place]) History of nonischemic card cardiomyopathy with ejection fraction in the 40 to 45% range on appropriate medications and relatively asymptomatic.  PAF (paroxysmal atrial fibrillation) (HCC) History of paroxysmal atrial fibrillation status post dofetilide  loading 2/20 maintaining sinus rhythm on Tikosyn and warfarin anticoagulation.      [name] MD FACP,FACC,FAHA, [facility] 12/02/2019 11:12 AM

## 2019-12-07 ENCOUNTER — Encounter: Payer: Self-pay | Admitting: Internal Medicine

## 2019-12-07 ENCOUNTER — Other Ambulatory Visit: Payer: Self-pay

## 2019-12-07 ENCOUNTER — Ambulatory Visit: Payer: Medicare Other | Admitting: Internal Medicine

## 2019-12-07 VITALS — BP 136/86 | HR 84 | Ht 65.0 in | Wt 275.2 lb

## 2019-12-07 DIAGNOSIS — I4819 Other persistent atrial fibrillation: Secondary | ICD-10-CM

## 2019-12-07 DIAGNOSIS — I10 Essential (primary) hypertension: Secondary | ICD-10-CM

## 2019-12-07 DIAGNOSIS — I442 Atrioventricular block, complete: Secondary | ICD-10-CM

## 2019-12-07 DIAGNOSIS — Z952 Presence of prosthetic heart valve: Secondary | ICD-10-CM | POA: Diagnosis not present

## 2019-12-07 NOTE — Progress Notes (Signed)
PCP: [name], MD Primary Cardiologist: Dr Gwenlyn Found Primary EP:  Dr Rayann Heman  [name] is a 69 y.o. female who presents today for routine electrophysiology followup.  Since last being seen in our clinic, the patient reports doing very well. She is not very active. Today, she denies symptoms of palpitations, chest pain, shortness of breath,  lower extremity edema, dizziness, presyncope, or syncope.  The patient is otherwise without complaint today.   Past Medical History:  Diagnosis Date  . Antiphospholipid antibody positive 06/28/2017  . Asthma   . CAD (coronary artery disease)    a. normal cors by cath in 03/2015  . Carotid artery disease ([place])   . Chronic anticoagulation   . Complete heart block ([place]) 02/2013   a. Implantation of a dual chamber pacemaker in 02/2013 by Dr Rayann Heman. STJ model number 3557 pacemaker with model number 2088 right ventricular lead.   [name] Dyslipidemia   . H/O Doppler ultrasound 10/03/2010   carotid duplex - R ICA normal patency; L CCA-ICA bypass gradt patent common to interal cartoid bypass graft   . H/O mechanical aortic valve replacement   . History of echocardiogram 11/08/2011   EF >55%; mild-mod concentric LVH; trace aortic regurg.; LA mild-mod dilated  . History of nuclear stress test 01/20/2010   normal pattern of perfusion; low risk scan  . Hypertension   . Lupus (systemic lupus erythematosus) ([place])   . PVD (peripheral vascular disease) (HCC)    60% R renal artery stenosis; non-functioning L kidney   Past Surgical History:  Procedure Laterality Date  . AORTIC VALVE REPLACEMENT  11/29/006   [place]; [place]; on coumadin  . CARDIAC CATHETERIZATION  11/01/2004   define anatomy   . CARDIAC CATHETERIZATION N/A 04/19/2015   Procedure: Coronary Angiography;  Surgeon: Peter M [name], MD;  Location: [place] [facility];  Service: Cardiovascular;  Laterality: N/A;  . CAROTID ENDARTERECTOMY     lt.  . CHOLECYSTECTOMY  1999  . EXPLORATORY LAPAROTOMY   04/12/2012   small bowel perf; ileostomy & R hemicolectomy   . PACEMAKER INSERTION  03/06/2013   SJM Assurity DR PPM implanted by Dr Rayann Heman for complete heart block  . PERMANENT PACEMAKER INSERTION N/A 03/06/2013   Procedure: PERMANENT PACEMAKER INSERTION;  Surgeon: Coralyn Mark, MD;  Location: [place] CATH LAB;  Service: Cardiovascular;  Laterality: N/A;  . TEMPORARY PACEMAKER INSERTION N/A 03/06/2013   Procedure: TEMPORARY PACEMAKER INSERTION;  Surgeon: Coralyn Mark, MD;  Location: [place] CATH LAB;  Service: Cardiovascular;  Laterality: N/A;  . TUBAL LIGATION  11/1982    ROS- all systems are reviewed and negative except as per HPI above  Current Outpatient Medications  Medication Sig Dispense Refill  . acetaminophen (TYLENOL) 325 MG tablet Take 650 mg by mouth every 6 (six) hours as needed for moderate pain.     [name] albuterol (PROVENTIL HFA;VENTOLIN HFA) 108 (90 Base) MCG/ACT inhaler Inhale 2 puffs into the lungs every 6 (six) hours as needed for wheezing or shortness of breath. 3 Inhaler 1  . allopurinol (ZYLOPRIM) 100 MG tablet Take 100 mg by mouth daily.     [name] amoxicillin (AMOXIL) 500 MG capsule TK 4 CS PO ONE HOUR BEFORE APPT    . Calcium Carbonate (CALTRATE 600) 1500 MG TABS Take 1 tablet by mouth 2 (two) times daily.      . cetirizine (ZYRTEC) 10 MG tablet Take 10 mg by mouth at bedtime.    . chlorhexidine (PERIDEX) 0.12 % solution  Use as directed 10 mLs in the mouth or throat 2 (two) times daily as needed (before dental appts).   5  . cholecalciferol (VITAMIN D) 1000 UNITS tablet Take 2,000 Units by mouth 2 (two) times daily.     . clotrimazole-betamethasone (LOTRISONE) cream as needed.    . colestipol (COLESTID) 1 g tablet Take 1 g by mouth daily.    [name] dicyclomine (BENTYL) 20 MG tablet Take by mouth.    . dofetilide (TIKOSYN) 250 MCG capsule Take 1 capsule (250 mcg total) by mouth 2 (two) times daily. 180 capsule 2  . Fluticasone-Salmeterol (WIXELA INHUB) 250-50 MCG/DOSE AEPB Inhale 1 puff  into the lungs daily. 180 each 5  . furosemide (LASIX) 20 MG tablet Take 1 tablet (20 mg total) by mouth daily. 30 tablet 6  . hydroxychloroquine (PLAQUENIL) 200 MG tablet Take 200 mg by mouth daily.    . isosorbide mononitrate (IMDUR) 30 MG 24 hr tablet TAKE 1 TABLET BY MOUTH  DAILY 30 tablet 1  . leflunomide (ARAVA) 10 MG tablet Take 10 mg by mouth daily.    [name] loratadine (CLARITIN) 10 MG tablet Take 10 mg by mouth daily.    . Magnesium 200 MG TABS Take 1 tablet (200 mg total) by mouth daily. 30 each   . metoprolol succinate (TOPROL-XL) 50 MG 24 hr tablet TAKE 1 TABLET BY MOUTH  DAILY WITH OR IMMEDIATELY  FOLLOWING A MEAL 30 tablet 1  . potassium chloride SA (KLOR-CON) 20 MEQ tablet TAKE 1  BY MOUTH TWICE DAILY 60 tablet 6  . predniSONE (DELTASONE) 5 MG tablet Take 5 mg by mouth daily.      . Probiotic Product (PROBIOTIC DAILY PO) Take 1 tablet by mouth daily.    . simvastatin (ZOCOR) 20 MG tablet Take 1 tablet by mouth at  bedtime 90 tablet 0  . warfarin (COUMADIN) 5 MG tablet TAKE 1 TO 1 AND 1/2 TABLETS BY MOUTH DAILY AS DIRECTED  BY COUMADIN CLINIC 135 tablet 1   No current facility-administered medications for this visit.    Physical Exam: Vitals:   12/07/19 1602  BP: 136/86  Pulse: 84  SpO2: 99%  Weight: 275 lb 3.2 oz (124.8 kg)  Height: 5\' 5"  (1.651 m)    GEN- The patient is obese appearing, alert and oriented x 3 today.   Head- normocephalic, atraumatic Eyes-  Sclera clear, conjunctiva pink Ears- hearing intact Oropharynx- clear Lungs-  normal work of breathing Chest- pacemaker pocket is well healed Heart- Regular rate and rhythm  GI- soft  Extremities- no clubbing, cyanosis, or edema  Pacemaker interrogation- reviewed in detail today,  See PACEART report  ekg tracing ordered today is personally reviewed and shows sinus with V pacing, QRS 531 msec  Assessment and Plan:  1. Symptomatic complete heart block Normal pacemaker function See Pace Art report No changes  today she is device dependant today  2. Persistent afib Doing well with tikosyn despite moderate biatrial enlargement Labs 10/28/19 reviewed Qt is stable We will follow closely on tikosyn to avoid toxicity with this medicine  3. S/p AVR (mechanical) On coumadin  4. HTN Stable No change required today  5. HL Continue statin  6. Nonischemic CM/ chronic combined systolic and diastolic dysfunction Stable No change required today Could consider entresto.  I will defer to Dr Gwenlyn Found  7. Morbid obesity Body mass index is 45.8 kg/m. Lifestyle modification was discussed at length today  Risks, benefits and potential toxicities for medications prescribed and/or refilled  reviewed with patient today.   Return to AF clinic in 6 months I will see in a year  Thompson Grayer MD, [facility] 12/07/2019 4:13 PM

## 2019-12-07 NOTE — Patient Instructions (Addendum)
Medication Instructions:  Your physician recommends that you continue on your current medications as directed. Please refer to the Current Medication list given to you today.  *If you need a refill on your cardiac medications before your next appointment, please call your pharmacy*  Lab Work: None ordered.  If you have labs (blood work) drawn today and your tests are completely normal, you will receive your results only by: [name] MyChart Message (if you have MyChart) OR . A paper copy in the mail If you have any lab test that is abnormal or we need to change your treatment, we will call you to review the results.  Testing/Procedures: None ordered.  Follow-Up: At [facility], you and your health needs are our priority.  As part of our continuing mission to provide you with exceptional heart care, we have created designated Provider Care Teams.  These Care Teams include your primary Cardiologist (physician) and Advanced Practice Providers (APPs -  Physician Assistants and Nurse Practitioners) who all work together to provide you with the care you need, when you need it.  We recommend signing up for the patient portal called "MyChart".  Sign up information is provided on this After Visit Summary.  MyChart is used to connect with patients for Virtual Visits (Telemedicine).  Patients are able to view lab/test results, encounter notes, upcoming appointments, etc.  Non-urgent messages can be sent to your provider as well.   To learn more about what you can do with MyChart, go to [email or web address].    Your next appointment:   Your physician wants you to follow-up in:  6 months with the Afib Clinic, they will contact you. 1 year with Dr. Rayann Heman. You will receive a reminder letter in the mail two months in advance. If you don't receive a letter, please call our office to schedule the follow-up appointment.  Remote monitoring is used to monitor your Pacemaker from home. This monitoring reduces the  number of office visits required to check your device to one time per year. It allows [place] to keep an eye on the functioning of your device to ensure it is working properly. You are scheduled for a device check from home on 01/13/20. You may send your transmission at any time that day. If you have a wireless device, the transmission will be sent automatically. After your physician reviews your transmission, you will receive a postcard with your next transmission date.  Other Instructions:

## 2019-12-10 LAB — POCT INR: INR: 3.5 — AB (ref 2.0–3.0)

## 2019-12-11 ENCOUNTER — Ambulatory Visit (INDEPENDENT_AMBULATORY_CARE_PROVIDER_SITE_OTHER): Payer: Medicare Other | Admitting: Cardiology

## 2019-12-11 DIAGNOSIS — Z952 Presence of prosthetic heart valve: Secondary | ICD-10-CM | POA: Diagnosis not present

## 2019-12-11 DIAGNOSIS — Z5181 Encounter for therapeutic drug level monitoring: Secondary | ICD-10-CM | POA: Diagnosis not present

## 2019-12-14 LAB — CUP PACEART INCLINIC DEVICE CHECK
Battery Remaining Longevity: 112 mo
Battery Voltage: 2.95 V
Brady Statistic RA Percent Paced: 19 %
Brady Statistic RV Percent Paced: 99.86 %
Date Time Interrogation Session: 20211018164119
Implantable Lead Implant Date: 20150116
Implantable Lead Implant Date: 20150116
Implantable Lead Location: 753859
Implantable Lead Location: 753860
Implantable Lead Model: 5076
Implantable Pulse Generator Implant Date: 20150116
Lead Channel Impedance Value: 400 Ohm
Lead Channel Impedance Value: 625 Ohm
Lead Channel Pacing Threshold Amplitude: 0.75 V
Lead Channel Pacing Threshold Amplitude: 0.75 V
Lead Channel Pacing Threshold Amplitude: 0.75 V
Lead Channel Pacing Threshold Pulse Width: 0.4 ms
Lead Channel Pacing Threshold Pulse Width: 0.4 ms
Lead Channel Pacing Threshold Pulse Width: 0.4 ms
Lead Channel Sensing Intrinsic Amplitude: 12 mV
Lead Channel Sensing Intrinsic Amplitude: 5 mV
Lead Channel Setting Pacing Amplitude: 1 V
Lead Channel Setting Pacing Amplitude: 2 V
Lead Channel Setting Pacing Pulse Width: 0.4 ms
Lead Channel Setting Sensing Sensitivity: 2 mV
Pulse Gen Model: 2240
Pulse Gen Serial Number: 7587004

## 2019-12-16 ENCOUNTER — Other Ambulatory Visit: Payer: Self-pay | Admitting: Cardiovascular Disease

## 2019-12-16 DIAGNOSIS — I472 Ventricular tachycardia: Secondary | ICD-10-CM

## 2019-12-16 DIAGNOSIS — I4729 Other ventricular tachycardia: Secondary | ICD-10-CM

## 2019-12-24 ENCOUNTER — Telehealth: Payer: Self-pay | Admitting: Cardiovascular Disease

## 2019-12-24 LAB — POCT INR: INR: 3.8 — AB (ref 2.0–3.0)

## 2019-12-24 NOTE — Telephone Encounter (Signed)
New message:    Dr. Royce Macadamia need patient last Renal test sent to [phone]. Patient is in the office now waiting.

## 2019-12-24 NOTE — Telephone Encounter (Signed)
Forwarded lab as requested

## 2019-12-25 ENCOUNTER — Ambulatory Visit (INDEPENDENT_AMBULATORY_CARE_PROVIDER_SITE_OTHER): Payer: Medicare Other | Admitting: Internal Medicine

## 2019-12-25 DIAGNOSIS — Z952 Presence of prosthetic heart valve: Secondary | ICD-10-CM | POA: Diagnosis not present

## 2019-12-25 DIAGNOSIS — Z5181 Encounter for therapeutic drug level monitoring: Secondary | ICD-10-CM

## 2019-12-31 ENCOUNTER — Other Ambulatory Visit: Payer: Self-pay

## 2019-12-31 ENCOUNTER — Ambulatory Visit (HOSPITAL_COMMUNITY): Payer: Medicare Other | Attending: Internal Medicine

## 2019-12-31 DIAGNOSIS — Z952 Presence of prosthetic heart valve: Secondary | ICD-10-CM | POA: Insufficient documentation

## 2020-01-01 LAB — ECHOCARDIOGRAM COMPLETE
AR max vel: 1.35 cm2
AV Area VTI: 1.42 cm2
AV Area mean vel: 1.3 cm2
AV Mean grad: 13 mmHg
AV Peak grad: 23.6 mmHg
Ao pk vel: 2.43 m/s
Area-P 1/2: 3.51 cm2
MV M vel: 5.23 m/s
MV Peak grad: 109.6 mmHg
P 1/2 time: 381 msec
Radius: 0.5 cm
S' Lateral: 3.8 cm

## 2020-01-06 ENCOUNTER — Other Ambulatory Visit: Payer: Self-pay

## 2020-01-06 DIAGNOSIS — Z952 Presence of prosthetic heart valve: Secondary | ICD-10-CM

## 2020-01-10 ENCOUNTER — Other Ambulatory Visit: Payer: Self-pay | Admitting: Cardiovascular Disease

## 2020-01-11 LAB — POCT INR: INR: 2.6 (ref 2.0–3.0)

## 2020-01-12 ENCOUNTER — Ambulatory Visit (INDEPENDENT_AMBULATORY_CARE_PROVIDER_SITE_OTHER): Payer: Medicare Other | Admitting: Cardiovascular Disease

## 2020-01-12 DIAGNOSIS — Z952 Presence of prosthetic heart valve: Secondary | ICD-10-CM

## 2020-01-12 DIAGNOSIS — Z5181 Encounter for therapeutic drug level monitoring: Secondary | ICD-10-CM | POA: Diagnosis not present

## 2020-01-13 ENCOUNTER — Ambulatory Visit (INDEPENDENT_AMBULATORY_CARE_PROVIDER_SITE_OTHER): Payer: Medicare Other

## 2020-01-13 DIAGNOSIS — I4819 Other persistent atrial fibrillation: Secondary | ICD-10-CM | POA: Diagnosis not present

## 2020-01-13 LAB — CUP PACEART REMOTE DEVICE CHECK
Battery Remaining Longevity: 112 mo
Battery Remaining Percentage: 89 %
Battery Voltage: 2.95 V
Brady Statistic AP VP Percent: 18 %
Brady Statistic AP VS Percent: 1 %
Brady Statistic AS VP Percent: 82 %
Brady Statistic AS VS Percent: 1 %
Brady Statistic RA Percent Paced: 18 %
Brady Statistic RV Percent Paced: 99 %
Date Time Interrogation Session: 20211124020013
Implantable Lead Implant Date: 20150116
Implantable Lead Implant Date: 20150116
Implantable Lead Location: 753859
Implantable Lead Location: 753860
Implantable Lead Model: 5076
Implantable Pulse Generator Implant Date: 20150116
Lead Channel Impedance Value: 360 Ohm
Lead Channel Impedance Value: 580 Ohm
Lead Channel Pacing Threshold Amplitude: 0.625 V
Lead Channel Pacing Threshold Amplitude: 0.75 V
Lead Channel Pacing Threshold Pulse Width: 0.4 ms
Lead Channel Pacing Threshold Pulse Width: 0.4 ms
Lead Channel Sensing Intrinsic Amplitude: 12 mV
Lead Channel Sensing Intrinsic Amplitude: 3.9 mV
Lead Channel Setting Pacing Amplitude: 0.875
Lead Channel Setting Pacing Amplitude: 2 V
Lead Channel Setting Pacing Pulse Width: 0.4 ms
Lead Channel Setting Sensing Sensitivity: 2 mV
Pulse Gen Model: 2240
Pulse Gen Serial Number: 7587004

## 2020-01-22 NOTE — Progress Notes (Signed)
Remote pacemaker transmission.   

## 2020-01-26 LAB — POCT INR: INR: 4 — AB (ref 2.0–3.0)

## 2020-01-27 ENCOUNTER — Ambulatory Visit (INDEPENDENT_AMBULATORY_CARE_PROVIDER_SITE_OTHER): Payer: Medicare Other | Admitting: Cardiovascular Disease

## 2020-01-27 DIAGNOSIS — Z5181 Encounter for therapeutic drug level monitoring: Secondary | ICD-10-CM

## 2020-01-27 DIAGNOSIS — Z952 Presence of prosthetic heart valve: Secondary | ICD-10-CM | POA: Diagnosis not present

## 2020-02-10 LAB — POCT INR: INR: 4.5 — AB (ref 2.0–3.0)

## 2020-02-11 ENCOUNTER — Ambulatory Visit (INDEPENDENT_AMBULATORY_CARE_PROVIDER_SITE_OTHER): Payer: Medicare Other | Admitting: Cardiovascular Disease

## 2020-02-11 DIAGNOSIS — Z7901 Long term (current) use of anticoagulants: Secondary | ICD-10-CM | POA: Diagnosis not present

## 2020-02-11 DIAGNOSIS — I4819 Other persistent atrial fibrillation: Secondary | ICD-10-CM

## 2020-02-26 ENCOUNTER — Ambulatory Visit (INDEPENDENT_AMBULATORY_CARE_PROVIDER_SITE_OTHER): Payer: Medicare Other | Admitting: Cardiology

## 2020-02-26 DIAGNOSIS — Z952 Presence of prosthetic heart valve: Secondary | ICD-10-CM | POA: Diagnosis not present

## 2020-02-26 DIAGNOSIS — Z5181 Encounter for therapeutic drug level monitoring: Secondary | ICD-10-CM | POA: Diagnosis not present

## 2020-02-26 LAB — POCT INR: INR: 2.5 (ref 2.0–3.0)

## 2020-03-16 ENCOUNTER — Ambulatory Visit (INDEPENDENT_AMBULATORY_CARE_PROVIDER_SITE_OTHER): Payer: Medicare Other | Admitting: Cardiovascular Disease

## 2020-03-16 ENCOUNTER — Other Ambulatory Visit (HOSPITAL_COMMUNITY): Payer: Self-pay | Admitting: Nurse Practitioner

## 2020-03-16 DIAGNOSIS — Z952 Presence of prosthetic heart valve: Secondary | ICD-10-CM

## 2020-03-16 DIAGNOSIS — Z5181 Encounter for therapeutic drug level monitoring: Secondary | ICD-10-CM

## 2020-03-16 LAB — POCT INR: INR: 2.9 (ref 2.0–3.0)

## 2020-03-31 LAB — POCT INR: INR: 3.5 — AB (ref 2.0–3.0)

## 2020-04-01 ENCOUNTER — Ambulatory Visit (INDEPENDENT_AMBULATORY_CARE_PROVIDER_SITE_OTHER): Payer: Medicare Other | Admitting: Cardiovascular Disease

## 2020-04-01 DIAGNOSIS — Z5181 Encounter for therapeutic drug level monitoring: Secondary | ICD-10-CM | POA: Diagnosis not present

## 2020-04-01 DIAGNOSIS — Z952 Presence of prosthetic heart valve: Secondary | ICD-10-CM | POA: Diagnosis not present

## 2020-04-16 LAB — POCT INR: INR: 2.2 (ref 2.0–3.0)

## 2020-04-18 ENCOUNTER — Ambulatory Visit (INDEPENDENT_AMBULATORY_CARE_PROVIDER_SITE_OTHER): Payer: Medicare Other | Admitting: Internal Medicine

## 2020-04-18 DIAGNOSIS — Z952 Presence of prosthetic heart valve: Secondary | ICD-10-CM

## 2020-04-18 DIAGNOSIS — Z5181 Encounter for therapeutic drug level monitoring: Secondary | ICD-10-CM

## 2020-04-28 LAB — POCT INR: INR: 3.7 — AB (ref 2.0–3.0)

## 2020-04-29 ENCOUNTER — Ambulatory Visit (INDEPENDENT_AMBULATORY_CARE_PROVIDER_SITE_OTHER): Payer: Medicare Other | Admitting: Cardiology

## 2020-04-29 DIAGNOSIS — Z952 Presence of prosthetic heart valve: Secondary | ICD-10-CM | POA: Diagnosis not present

## 2020-04-29 DIAGNOSIS — Z5181 Encounter for therapeutic drug level monitoring: Secondary | ICD-10-CM

## 2020-05-03 ENCOUNTER — Other Ambulatory Visit: Payer: Self-pay | Admitting: Cardiovascular Disease

## 2020-05-13 ENCOUNTER — Ambulatory Visit (INDEPENDENT_AMBULATORY_CARE_PROVIDER_SITE_OTHER): Payer: Medicare Other | Admitting: Pharmacist Clinician (PhC)/ Clinical Pharmacy Specialist

## 2020-05-13 DIAGNOSIS — Z952 Presence of prosthetic heart valve: Secondary | ICD-10-CM

## 2020-05-13 DIAGNOSIS — Z5181 Encounter for therapeutic drug level monitoring: Secondary | ICD-10-CM

## 2020-05-13 DIAGNOSIS — Z7901 Long term (current) use of anticoagulants: Secondary | ICD-10-CM | POA: Diagnosis not present

## 2020-05-13 LAB — POCT INR: INR: 4 — AB (ref 2.0–3.0)

## 2020-05-27 ENCOUNTER — Ambulatory Visit (INDEPENDENT_AMBULATORY_CARE_PROVIDER_SITE_OTHER): Payer: Medicare Other | Admitting: Cardiovascular Disease

## 2020-05-27 DIAGNOSIS — Z952 Presence of prosthetic heart valve: Secondary | ICD-10-CM

## 2020-05-27 DIAGNOSIS — Z5181 Encounter for therapeutic drug level monitoring: Secondary | ICD-10-CM | POA: Diagnosis not present

## 2020-05-27 LAB — POCT INR: INR: 2.6 (ref 2.0–3.0)

## 2020-06-08 ENCOUNTER — Encounter (HOSPITAL_COMMUNITY): Payer: Self-pay | Admitting: Physician Assistant

## 2020-06-08 ENCOUNTER — Ambulatory Visit (HOSPITAL_COMMUNITY)
Admission: RE | Admit: 2020-06-08 | Discharge: 2020-06-08 | Disposition: A | Discharge status: Home / Self Care | Payer: Medicare Other | Source: Ambulatory Visit | Attending: Physician Assistant | Admitting: Physician Assistant

## 2020-06-08 ENCOUNTER — Other Ambulatory Visit: Payer: Self-pay

## 2020-06-08 VITALS — BP 138/78 | HR 72 | Ht 65.0 in | Wt 268.6 lb

## 2020-06-08 DIAGNOSIS — I359 Nonrheumatic aortic valve disorder, unspecified: Secondary | ICD-10-CM | POA: Insufficient documentation

## 2020-06-08 DIAGNOSIS — I4819 Other persistent atrial fibrillation: Secondary | ICD-10-CM

## 2020-06-08 DIAGNOSIS — I442 Atrioventricular block, complete: Secondary | ICD-10-CM | POA: Insufficient documentation

## 2020-06-08 DIAGNOSIS — Z950 Presence of cardiac pacemaker: Secondary | ICD-10-CM | POA: Diagnosis not present

## 2020-06-08 DIAGNOSIS — Z9049 Acquired absence of other specified parts of digestive tract: Secondary | ICD-10-CM | POA: Insufficient documentation

## 2020-06-08 DIAGNOSIS — D6869 Other thrombophilia: Secondary | ICD-10-CM

## 2020-06-08 DIAGNOSIS — I5042 Chronic combined systolic (congestive) and diastolic (congestive) heart failure: Secondary | ICD-10-CM | POA: Diagnosis not present

## 2020-06-08 DIAGNOSIS — Z79899 Other long term (current) drug therapy: Secondary | ICD-10-CM | POA: Insufficient documentation

## 2020-06-08 DIAGNOSIS — E669 Obesity, unspecified: Secondary | ICD-10-CM | POA: Diagnosis not present

## 2020-06-08 DIAGNOSIS — Z952 Presence of prosthetic heart valve: Secondary | ICD-10-CM | POA: Diagnosis not present

## 2020-06-08 DIAGNOSIS — I2510 Atherosclerotic heart disease of native coronary artery without angina pectoris: Secondary | ICD-10-CM | POA: Insufficient documentation

## 2020-06-08 DIAGNOSIS — Z888 Allergy status to other drugs, medicaments and biological substances status: Secondary | ICD-10-CM | POA: Diagnosis not present

## 2020-06-08 DIAGNOSIS — R600 Localized edema: Secondary | ICD-10-CM | POA: Insufficient documentation

## 2020-06-08 DIAGNOSIS — Z7951 Long term (current) use of inhaled steroids: Secondary | ICD-10-CM | POA: Diagnosis not present

## 2020-06-08 DIAGNOSIS — Z7952 Long term (current) use of systemic steroids: Secondary | ICD-10-CM | POA: Insufficient documentation

## 2020-06-08 DIAGNOSIS — Z6841 Body Mass Index (BMI) 40.0 and over, adult: Secondary | ICD-10-CM | POA: Insufficient documentation

## 2020-06-08 DIAGNOSIS — I110 Hypertensive heart disease with heart failure: Secondary | ICD-10-CM | POA: Diagnosis not present

## 2020-06-08 DIAGNOSIS — Z7901 Long term (current) use of anticoagulants: Secondary | ICD-10-CM | POA: Diagnosis not present

## 2020-06-08 LAB — CBC
HCT: 38.7 % (ref 36.0–46.0)
Hemoglobin: 11.6 g/dL — ABNORMAL LOW (ref 12.0–15.0)
MCH: 28.1 pg (ref 26.0–34.0)
MCHC: 30 g/dL (ref 30.0–36.0)
MCV: 93.7 fL (ref 80.0–100.0)
Platelets: 180 10*3/uL (ref 150–400)
RBC: 4.13 MIL/uL (ref 3.87–5.11)
RDW: 16.6 % — ABNORMAL HIGH (ref 11.5–15.5)
WBC: 5.7 10*3/uL (ref 4.0–10.5)
nRBC: 0 % (ref 0.0–0.2)

## 2020-06-08 LAB — BASIC METABOLIC PANEL
Anion gap: 8 (ref 5–15)
BUN: 26 mg/dL — ABNORMAL HIGH (ref 8–23)
CO2: 29 mmol/L (ref 22–32)
Calcium: 9.9 mg/dL (ref 8.9–10.3)
Chloride: 102 mmol/L (ref 98–111)
Creatinine, Ser: 1.41 mg/dL — ABNORMAL HIGH (ref 0.44–1.00)
GFR, Estimated: 41 mL/min — ABNORMAL LOW (ref >60)
Glucose, Bld: 102 mg/dL — ABNORMAL HIGH (ref 70–99)
Potassium: 4.5 mmol/L (ref 3.5–5.1)
Sodium: 139 mmol/L (ref 135–145)

## 2020-06-08 LAB — MAGNESIUM: Magnesium: 2.1 mg/dL (ref 1.7–2.4)

## 2020-06-08 NOTE — Progress Notes (Signed)
Primary Care Physician: [name], MD Primary Cardiologist: Dr Gwenlyn Found Primary EP: Dr Rayann Heman Referring Physician:Dr. Rayann Heman   [name] is a 70 y.o. female with a h/o PPM, CAD, mechanical  AVR on warfarin, persistent afib for the last 30 days per [facility] device alert brought to the attention of [facility], NP/ Dr. Rayann Heman. He feels that is is worth a try to try pt on tikosyn as she is having more issues with edema and shortness of breath in the last several weeks.  He is concerned with her obesity and severe TR that the results may be marginal. The pt is informed of dofetilide admission and what is to be expected with hospitalization for same. She is interested in pursing. EKG shows v paced today at 76 bpm. She is on warfarin with a CHADS2VASC score of 4. Patient is s/p dofetilide loading 03/2018.  On follow up today, patient reports that she has done well since her last visit. She denies any heart racing or palpitations. Her last device report shows <1% afib burden. She denies any bleeding issues on anticoagulation.   Today, she denies symptoms of palpitations, chest pain, shortness of breath, orthopnea, PND, lower extremity edema, dizziness, presyncope, syncope, or neurologic sequela. The patient is tolerating medications without difficulties and is otherwise without complaint today.   Past Medical History:  Diagnosis Date  . Antiphospholipid antibody positive 06/28/2017  . Asthma   . CAD (coronary artery disease)    a. normal cors by cath in 03/2015  . Carotid artery disease ([place])   . Chronic anticoagulation   . Complete heart block ([place]) 02/2013   a. Implantation of a dual chamber pacemaker in 02/2013 by Dr Rayann Heman. STJ model number [id number] pacemaker with model number [id number] right ventricular lead.   [name] Dyslipidemia   . H/O Doppler ultrasound 10/03/2010   carotid duplex - R ICA normal patency; L CCA-ICA bypass gradt patent common to interal cartoid bypass graft   . H/O mechanical aortic  valve replacement   . History of echocardiogram 11/08/2011   EF >55%; mild-mod concentric LVH; trace aortic regurg.; LA mild-mod dilated  . History of nuclear stress test 01/20/2010   normal pattern of perfusion; low risk scan  . Hypertension   . Lupus (systemic lupus erythematosus) ([place])   . PVD (peripheral vascular disease) ([facility])    60% R renal artery stenosis; non-functioning L kidney   Past Surgical History:  Procedure Laterality Date  . AORTIC VALVE REPLACEMENT  11/29/006   [place]; [place]; on coumadin  . CARDIAC CATHETERIZATION  11/01/2004   define anatomy   . CARDIAC CATHETERIZATION N/A 04/19/2015   Procedure: Coronary Angiography;  Surgeon: Peter M [name], MD;  Location: [place] CV LAB;  Service: Cardiovascular;  Laterality: N/A;  . CAROTID ENDARTERECTOMY     lt.  . CHOLECYSTECTOMY  1999  . EXPLORATORY LAPAROTOMY  04/12/2012   small bowel perf; ileostomy & R hemicolectomy   . PACEMAKER INSERTION  03/06/2013   SJM Assurity DR PPM implanted by Dr Rayann Heman for complete heart block  . PERMANENT PACEMAKER INSERTION N/A 03/06/2013   Procedure: PERMANENT PACEMAKER INSERTION;  Surgeon: Coralyn Mark, MD;  Location: [place] CATH LAB;  Service: Cardiovascular;  Laterality: N/A;  . TEMPORARY PACEMAKER INSERTION N/A 03/06/2013   Procedure: TEMPORARY PACEMAKER INSERTION;  Surgeon: Coralyn Mark, MD;  Location: [place] CATH LAB;  Service: Cardiovascular;  Laterality: N/A;  . TUBAL LIGATION  11/1982    Current Outpatient Medications  Medication Sig  Dispense Refill  . acetaminophen (TYLENOL) 325 MG tablet Take 650 mg by mouth every 6 (six) hours as needed for moderate pain.     [name] albuterol (PROVENTIL HFA;VENTOLIN HFA) 108 (90 Base) MCG/ACT inhaler Inhale 2 puffs into the lungs every 6 (six) hours as needed for wheezing or shortness of breath. 3 Inhaler 1  . allopurinol (ZYLOPRIM) 100 MG tablet Take 100 mg by mouth daily.     . calcium carbonate (OSCAL) 1500 (600 Ca) MG TABS tablet Take 1 tablet by  mouth 2 (two) times daily.    . cetirizine (ZYRTEC) 10 MG tablet Take 10 mg by mouth at bedtime.    . chlorhexidine (PERIDEX) 0.12 % solution Use as directed 10 mLs in the mouth or throat 2 (two) times daily as needed (before dental appts).   5  . cholecalciferol (VITAMIN D) 1000 UNITS tablet Take 2,000 Units by mouth 2 (two) times daily.     . clotrimazole-betamethasone (LOTRISONE) cream as needed.    . colestipol (COLESTID) 1 g tablet Take 1 g by mouth daily.    [name] dicyclomine (BENTYL) 20 MG tablet Take by mouth.    . dofetilide (TIKOSYN) 250 MCG capsule Take 1 capsule (250 mcg total) by mouth 2 (two) times daily. 180 capsule 2  . Fluticasone-Salmeterol (WIXELA INHUB) 250-50 MCG/DOSE AEPB Inhale 1 puff into the lungs daily. 180 each 5  . furosemide (LASIX) 20 MG tablet Take 1 tablet (20 mg total) by mouth daily. 30 tablet 6  . hydroxychloroquine (PLAQUENIL) 200 MG tablet Take 200 mg by mouth daily.    . isosorbide mononitrate (IMDUR) 30 MG 24 hr tablet TAKE 1 TABLET BY MOUTH  DAILY 60 tablet 5  . leflunomide (ARAVA) 10 MG tablet Take 10 mg by mouth daily.    [name] loratadine (CLARITIN) 10 MG tablet Take 10 mg by mouth daily.    . Magnesium 200 MG TABS Take 1 tablet (200 mg total) by mouth daily. 30 each   . metoprolol succinate (TOPROL-XL) 50 MG 24 hr tablet TAKE 1 TABLET BY MOUTH  DAILY WITH OR IMMEDIATELY  FOLLOWING A MEAL 60 tablet 5  . potassium chloride SA (KLOR-CON) 20 MEQ tablet Take 1 tablet by mouth twice daily 60 tablet 0  . predniSONE (DELTASONE) 5 MG tablet Take 5 mg by mouth daily.    . Probiotic Product (PROBIOTIC DAILY PO) Take 1 tablet by mouth daily.    . Saccharomyces boulardii (PROBIOTIC) 250 MG CAPS 1 capsule    . simvastatin (ZOCOR) 20 MG tablet Take 1 tablet by mouth at  bedtime 90 tablet 0  . warfarin (COUMADIN) 5 MG tablet TAKE 1 TO 1 AND 1/2 TABLETS BY MOUTH DAILY AS DIRECTED  BY COUMADIN CLINIC 135 tablet 0   No current facility-administered medications for this  encounter.    Allergies  Allergen Reactions  . Spironolactone Other (See Comments)    Hyperkalemia    Social History   Socioeconomic History  . Marital status: Divorced    Spouse name: Not on file  . Number of children: 3  . Years of education: Not on file  . Highest education level: Not on file  Occupational History  . Occupation: Retired  Tobacco Use  . Smoking status: Never Smoker  . Smokeless tobacco: Never Used  Vaping Use  . Vaping Use: Never used  Substance and Sexual Activity  . Alcohol use: No  . Drug use: No  . Sexual activity: Not on file  Other Topics Concern  .  Not on file  Social History Narrative   Lives alone in [place].   Social Determinants of Health   Financial Resource Strain: Not on file  Food Insecurity: Not on file  Transportation Needs: Not on file  Physical Activity: Not on file  Stress: Not on file  Social Connections: Not on file  Intimate Partner Violence: Not on file    Family History  Problem Relation Age of Onset  . Alzheimer's disease Father   . Cirrhosis Father   . Cancer Father        prostate  . Heart attack Neg Hx     ROS- All systems are reviewed and negative except as per the HPI above  Physical Exam: Vitals:   06/08/20 1412  BP: 138/78  Pulse: 72  Weight: 121.8 kg  Height: '5\' 5"'$  (1.651 m)   Wt Readings from Last 3 Encounters:  06/08/20 121.8 kg  12/07/19 124.8 kg  12/02/19 122 kg    Labs: Lab Results  Component Value Date   NA 140 10/28/2019   K 4.6 10/28/2019   CL 103 10/28/2019   CO2 28 10/28/2019   GLUCOSE 88 10/28/2019   BUN 27 (H) 10/28/2019   CREATININE 1.36 (H) 10/28/2019   CALCIUM 9.7 10/28/2019   MG 2.0 10/28/2019   Lab Results  Component Value Date   INR 2.6 05/27/2020   Lab Results  Component Value Date   CHOL 174 12/02/2019   HDL 99 12/02/2019   LDLCALC 54 12/02/2019   TRIG 124 12/02/2019    GEN- The patient is a well appearing obese female, alert and oriented x 3  today.   HEENT-head normocephalic, atraumatic, sclera clear, conjunctiva pink, hearing intact, trachea midline. Lungs- Clear to ausculation bilaterally, normal work of breathing Heart- Regular rate and rhythm, no murmurs, rubs or gallops, mech aortic valve GI- soft, NT, ND, + BS Extremities- no clubbing, cyanosis, or edema MS- no significant deformity or atrophy Skin- no rash or lesion Psych- euthymic mood, full affect Neuro- strength and sensation are intact   EKG- A sense V paced rhythm  Vent. rate 70 BPM PR interval 226 ms QRS duration 184 ms QT/QTc 462/498 ms  Echo 12/31/19 1. Left ventricular ejection fraction, by estimation, is 40 to 45%. The  left ventricle has mildly decreased function. The left ventricle  demonstrates global hypokinesis. The left ventricular internal cavity size  was mildly dilated. There is mild left  ventricular hypertrophy. Left ventricular diastolic parameters are  consistent with Grade II diastolic dysfunction (pseudonormalization).  2. Right ventricular systolic function is mildly reduced. The right  ventricular size is mildly enlarged. There is mildly elevated pulmonary  artery systolic pressure. The estimated right ventricular systolic  pressure is 0000000 mmHg.  3. Left atrial size was severely dilated.  4. Right atrial size was mildly dilated.  5. The mitral valve is grossly normal. Mild to moderate mitral valve  regurgitation. No evidence of mitral stenosis.  6. Tricuspid valve regurgitation is moderate to severe.  7. The aortic valve has been repaired/replaced. Aortic valve  regurgitation is trivial. There is a 23 mm [employer] mechanical valve  present in the aortic position. Procedure Date: 01/17/2005. Echo findings  are consistent with normal structure and  function of the aortic valve prosthesis. See findings for calculations.  Mean gradient 13 mmHg.  8. The inferior vena cava is dilated in size with <50% respiratory   variability, suggesting right atrial pressure of 15 mmHg.     Assessment and Plan:  1. Persistent atrial fibrillation   Patient appears to be maintaining SR with <1% burden on device.  Continue dofetilide 250 mcg BID. QT stable. Check bmet/mag today Continue warfarin  This patients CHA2DS2-VASc Score and unadjusted Ischemic Stroke Rate (% per year) is equal to 4.8 % stroke rate/year from a score of 4  Above score calculated as 1 point each if present [CHF, HTN, DM, Vascular=MI/PAD/Aortic Plaque, Age if 65-74, or Female] Above score calculated as 2 points each if present [Age > 75, or Stroke/TIA/TE]  2. Complete heart block S/p PPM, followed by Dr Rayann Heman and the device clinic.  3. Aortic valve disease S/p AVR with mechanical valve.  4. HTN Stable, no changes today.  5. Obesity Body mass index is 44.7 kg/m. Lifestyle modification was discussed and encouraged including regular physical activity and weight reduction.  6. Chronic combined systolic and diastolic CHF No signs or symptoms of fluid overload.   Follow up with Dr Rayann Heman per recall. AF clinic in one year.    [facility] [address] [place], [place] [address] [phone]

## 2020-06-09 LAB — POCT INR: INR: 3.5 — AB (ref 2.0–3.0)

## 2020-06-10 ENCOUNTER — Ambulatory Visit (INDEPENDENT_AMBULATORY_CARE_PROVIDER_SITE_OTHER): Payer: Medicare Other | Admitting: Cardiovascular Disease

## 2020-06-10 DIAGNOSIS — Z5181 Encounter for therapeutic drug level monitoring: Secondary | ICD-10-CM

## 2020-06-10 DIAGNOSIS — Z952 Presence of prosthetic heart valve: Secondary | ICD-10-CM | POA: Diagnosis not present

## 2020-06-23 LAB — POCT INR: INR: 3.4 — AB (ref 2.0–3.0)

## 2020-06-24 ENCOUNTER — Ambulatory Visit (INDEPENDENT_AMBULATORY_CARE_PROVIDER_SITE_OTHER): Payer: Medicare Other | Admitting: Cardiology

## 2020-06-24 DIAGNOSIS — Z952 Presence of prosthetic heart valve: Secondary | ICD-10-CM

## 2020-06-24 DIAGNOSIS — Z5181 Encounter for therapeutic drug level monitoring: Secondary | ICD-10-CM | POA: Diagnosis not present

## 2020-07-05 ENCOUNTER — Other Ambulatory Visit (HOSPITAL_COMMUNITY): Payer: Self-pay | Admitting: Nurse Practitioner

## 2020-07-09 LAB — POCT INR: INR: 1.5 — AB (ref 2.0–3.0)

## 2020-07-11 ENCOUNTER — Ambulatory Visit (INDEPENDENT_AMBULATORY_CARE_PROVIDER_SITE_OTHER): Payer: Medicare Other | Admitting: Cardiology

## 2020-07-11 DIAGNOSIS — Z952 Presence of prosthetic heart valve: Secondary | ICD-10-CM | POA: Diagnosis not present

## 2020-07-11 DIAGNOSIS — Z5181 Encounter for therapeutic drug level monitoring: Secondary | ICD-10-CM

## 2020-07-13 ENCOUNTER — Ambulatory Visit (INDEPENDENT_AMBULATORY_CARE_PROVIDER_SITE_OTHER): Payer: Medicare Other

## 2020-07-13 DIAGNOSIS — I442 Atrioventricular block, complete: Secondary | ICD-10-CM

## 2020-07-14 LAB — CUP PACEART REMOTE DEVICE CHECK
Battery Remaining Longevity: 95 mo
Battery Remaining Percentage: 80 %
Battery Voltage: 2.93 V
Brady Statistic AP VP Percent: 8.7 %
Brady Statistic AP VS Percent: 1 %
Brady Statistic AS VP Percent: 91 %
Brady Statistic AS VS Percent: 1 %
Brady Statistic RA Percent Paced: 8.1 %
Brady Statistic RV Percent Paced: 99 %
Date Time Interrogation Session: 20220525031211
Implantable Lead Implant Date: 20150116
Implantable Lead Implant Date: 20150116
Implantable Lead Location: 753859
Implantable Lead Location: 753860
Implantable Lead Model: 5076
Implantable Pulse Generator Implant Date: 20150116
Lead Channel Impedance Value: 350 Ohm
Lead Channel Impedance Value: 550 Ohm
Lead Channel Pacing Threshold Amplitude: 0.75 V
Lead Channel Pacing Threshold Amplitude: 1.5 V
Lead Channel Pacing Threshold Pulse Width: 0.4 ms
Lead Channel Pacing Threshold Pulse Width: 0.4 ms
Lead Channel Sensing Intrinsic Amplitude: 12 mV
Lead Channel Sensing Intrinsic Amplitude: 4 mV
Lead Channel Setting Pacing Amplitude: 1.75 V
Lead Channel Setting Pacing Amplitude: 2 V
Lead Channel Setting Pacing Pulse Width: 0.4 ms
Lead Channel Setting Sensing Sensitivity: 2 mV
Pulse Gen Model: 2240
Pulse Gen Serial Number: 7587004

## 2020-07-19 ENCOUNTER — Ambulatory Visit (INDEPENDENT_AMBULATORY_CARE_PROVIDER_SITE_OTHER): Payer: Medicare Other | Admitting: Cardiology

## 2020-07-19 DIAGNOSIS — Z5181 Encounter for therapeutic drug level monitoring: Secondary | ICD-10-CM | POA: Diagnosis not present

## 2020-07-19 DIAGNOSIS — Z952 Presence of prosthetic heart valve: Secondary | ICD-10-CM | POA: Diagnosis not present

## 2020-07-19 LAB — POCT INR: INR: 1.7 — AB (ref 2.0–3.0)

## 2020-07-22 ENCOUNTER — Other Ambulatory Visit (HOSPITAL_COMMUNITY): Payer: Self-pay | Admitting: Physician Assistant

## 2020-07-25 ENCOUNTER — Other Ambulatory Visit (HOSPITAL_COMMUNITY): Payer: Medicare Other

## 2020-07-26 ENCOUNTER — Ambulatory Visit (HOSPITAL_COMMUNITY)
Admission: RE | Admit: 2020-07-26 | Discharge: 2020-07-26 | Disposition: A | Discharge status: Home / Self Care | Payer: Medicare Other | Source: Ambulatory Visit | Attending: Internal Medicine | Admitting: Internal Medicine

## 2020-07-26 ENCOUNTER — Ambulatory Visit (HOSPITAL_BASED_OUTPATIENT_CLINIC_OR_DEPARTMENT_OTHER)
Admission: RE | Admit: 2020-07-26 | Discharge: 2020-07-26 | Disposition: A | Discharge status: Home / Self Care | Payer: Medicare Other | Source: Ambulatory Visit | Attending: Cardiovascular Disease | Admitting: Cardiovascular Disease

## 2020-07-26 ENCOUNTER — Other Ambulatory Visit (HOSPITAL_COMMUNITY): Payer: Self-pay | Admitting: Cardiovascular Disease

## 2020-07-26 ENCOUNTER — Other Ambulatory Visit: Payer: Self-pay

## 2020-07-26 DIAGNOSIS — I701 Atherosclerosis of renal artery: Secondary | ICD-10-CM | POA: Insufficient documentation

## 2020-07-26 DIAGNOSIS — I6522 Occlusion and stenosis of left carotid artery: Secondary | ICD-10-CM | POA: Insufficient documentation

## 2020-07-26 DIAGNOSIS — I6523 Occlusion and stenosis of bilateral carotid arteries: Secondary | ICD-10-CM

## 2020-07-28 ENCOUNTER — Ambulatory Visit (INDEPENDENT_AMBULATORY_CARE_PROVIDER_SITE_OTHER): Payer: Medicare Other

## 2020-07-28 DIAGNOSIS — Z5181 Encounter for therapeutic drug level monitoring: Secondary | ICD-10-CM | POA: Diagnosis not present

## 2020-07-28 DIAGNOSIS — Z952 Presence of prosthetic heart valve: Secondary | ICD-10-CM

## 2020-07-28 LAB — POCT INR: INR: 4 — AB (ref 2.0–3.0)

## 2020-08-04 LAB — POCT INR: INR: 4.2 — AB (ref 2.0–3.0)

## 2020-08-04 NOTE — Progress Notes (Signed)
Remote pacemaker transmission.   

## 2020-08-05 ENCOUNTER — Ambulatory Visit (INDEPENDENT_AMBULATORY_CARE_PROVIDER_SITE_OTHER): Payer: Medicare Other | Admitting: Internal Medicine

## 2020-08-05 DIAGNOSIS — Z952 Presence of prosthetic heart valve: Secondary | ICD-10-CM

## 2020-08-05 DIAGNOSIS — Z5181 Encounter for therapeutic drug level monitoring: Secondary | ICD-10-CM | POA: Diagnosis not present

## 2020-08-08 ENCOUNTER — Other Ambulatory Visit: Payer: Self-pay | Admitting: Cardiovascular Disease

## 2020-08-10 ENCOUNTER — Other Ambulatory Visit: Payer: Self-pay | Admitting: Cardiovascular Disease

## 2020-08-12 ENCOUNTER — Ambulatory Visit (INDEPENDENT_AMBULATORY_CARE_PROVIDER_SITE_OTHER): Payer: Medicare Other | Admitting: Pharmacist

## 2020-08-12 DIAGNOSIS — R760 Raised antibody titer: Secondary | ICD-10-CM | POA: Diagnosis not present

## 2020-08-12 DIAGNOSIS — Z5181 Encounter for therapeutic drug level monitoring: Secondary | ICD-10-CM | POA: Diagnosis not present

## 2020-08-12 LAB — POCT INR: INR: 2.9 (ref 2.0–3.0)

## 2020-08-19 ENCOUNTER — Ambulatory Visit (INDEPENDENT_AMBULATORY_CARE_PROVIDER_SITE_OTHER): Payer: Medicare Other | Admitting: Cardiovascular Disease

## 2020-08-19 DIAGNOSIS — Z952 Presence of prosthetic heart valve: Secondary | ICD-10-CM

## 2020-08-19 DIAGNOSIS — Z5181 Encounter for therapeutic drug level monitoring: Secondary | ICD-10-CM

## 2020-08-19 LAB — POCT INR: INR: 3.8 — AB (ref 2.0–3.0)

## 2020-08-24 ENCOUNTER — Ambulatory Visit (HOSPITAL_COMMUNITY): Payer: Medicare Other

## 2020-08-24 ENCOUNTER — Other Ambulatory Visit: Payer: Self-pay

## 2020-08-26 ENCOUNTER — Ambulatory Visit (INDEPENDENT_AMBULATORY_CARE_PROVIDER_SITE_OTHER): Payer: Medicare Other | Admitting: Cardiology

## 2020-08-26 DIAGNOSIS — Z5181 Encounter for therapeutic drug level monitoring: Secondary | ICD-10-CM | POA: Diagnosis not present

## 2020-08-26 DIAGNOSIS — Z952 Presence of prosthetic heart valve: Secondary | ICD-10-CM | POA: Diagnosis not present

## 2020-08-26 LAB — POCT INR: INR: 3.9 — AB (ref 2.0–3.0)

## 2020-09-02 ENCOUNTER — Ambulatory Visit (INDEPENDENT_AMBULATORY_CARE_PROVIDER_SITE_OTHER): Payer: Medicare Other | Admitting: Cardiology

## 2020-09-02 DIAGNOSIS — Z952 Presence of prosthetic heart valve: Secondary | ICD-10-CM | POA: Diagnosis not present

## 2020-09-02 DIAGNOSIS — Z5181 Encounter for therapeutic drug level monitoring: Secondary | ICD-10-CM

## 2020-09-02 LAB — POCT INR: INR: 3.8 — AB (ref 2.0–3.0)

## 2020-09-06 ENCOUNTER — Telehealth: Payer: Self-pay | Admitting: Cardiovascular Disease

## 2020-09-06 NOTE — Telephone Encounter (Signed)
Returned call to patient who states that she has been experiencing shortness of breath for the last couple of weeks. Patient states that it has gradually worsened and a couple of days ago it was so bad that she could not lie flat to sleep and was unable to get comfortable due to this. Patient states that she has the shortness of breath with exertion and at rest. Patient denies any chest pain, dizziness, or swelling. Patient reports that she saw her PCP yesterday who did an chest Xray and lab work and called her this morning and told her to double up on her lasix due to her "heart being enlarged" on chest Xray. Patient reports that she usually takes '10mg'$  lasix daily and has not increased up to '20mg'$  daily for the next 3 days. Patient is unable to give recent BP or Pulse readings. Patient is concerned and wanted to get in to see Dr. Gwenlyn Found to make sure there is no cardiac issue going on.  Made patient an appointment for tomorrow morning at 10:30am to see Dr. Gwenlyn Found.   Made patient aware of ED precautions should new or worsening symptoms develop. Patient verbalized understanding.   Will forward to MD to make aware.

## 2020-09-06 NOTE — Telephone Encounter (Signed)
    Pt c/o Shortness Of Breath: STAT if SOB developed within the last 24 hours or pt is noticeably SOB on the phone  1. Are you currently SOB (can you hear that pt is SOB on the phone)? no  2. How long have you been experiencing SOB? 2 weeks ago  3. Are you SOB when sitting or when up moving around? sitting  4. Are you currently experiencing any other symptoms? Just SOB  Pt said she's been having difficulty breathing, it was so bad she can't lay down and have to sleep on a chair. She saw her pcp yesterday and was told her heart is enlarge and double the dose of her lasix, she wanted to know if she needs to see Dr. Gwenlyn Found as well to check her heart

## 2020-09-07 ENCOUNTER — Other Ambulatory Visit: Payer: Self-pay

## 2020-09-07 ENCOUNTER — Encounter: Payer: Self-pay | Admitting: Cardiovascular Disease

## 2020-09-07 ENCOUNTER — Ambulatory Visit: Payer: Medicare Other | Admitting: Cardiovascular Disease

## 2020-09-07 ENCOUNTER — Emergency Department (HOSPITAL_COMMUNITY): Payer: Medicare Other

## 2020-09-07 ENCOUNTER — Encounter (HOSPITAL_COMMUNITY): Payer: Self-pay | Admitting: Emergency Medicine

## 2020-09-07 ENCOUNTER — Emergency Department (HOSPITAL_COMMUNITY)
Admission: EM | Admit: 2020-09-07 | Discharge: 2020-09-07 | Disposition: A | Discharge status: Home / Self Care | Payer: Medicare Other | Attending: Emergency Medicine | Admitting: Emergency Medicine

## 2020-09-07 DIAGNOSIS — W010XXA Fall on same level from slipping, tripping and stumbling without subsequent striking against object, initial encounter: Secondary | ICD-10-CM | POA: Insufficient documentation

## 2020-09-07 DIAGNOSIS — I4819 Other persistent atrial fibrillation: Secondary | ICD-10-CM

## 2020-09-07 DIAGNOSIS — Z7901 Long term (current) use of anticoagulants: Secondary | ICD-10-CM

## 2020-09-07 DIAGNOSIS — I442 Atrioventricular block, complete: Secondary | ICD-10-CM

## 2020-09-07 DIAGNOSIS — Z950 Presence of cardiac pacemaker: Secondary | ICD-10-CM | POA: Insufficient documentation

## 2020-09-07 DIAGNOSIS — Z7951 Long term (current) use of inhaled steroids: Secondary | ICD-10-CM | POA: Insufficient documentation

## 2020-09-07 DIAGNOSIS — I2510 Atherosclerotic heart disease of native coronary artery without angina pectoris: Secondary | ICD-10-CM | POA: Insufficient documentation

## 2020-09-07 DIAGNOSIS — M25562 Pain in left knee: Secondary | ICD-10-CM

## 2020-09-07 DIAGNOSIS — W19XXXA Unspecified fall, initial encounter: Secondary | ICD-10-CM

## 2020-09-07 DIAGNOSIS — I130 Hypertensive heart and chronic kidney disease with heart failure and stage 1 through stage 4 chronic kidney disease, or unspecified chronic kidney disease: Secondary | ICD-10-CM | POA: Diagnosis not present

## 2020-09-07 DIAGNOSIS — S40012A Contusion of left shoulder, initial encounter: Secondary | ICD-10-CM | POA: Insufficient documentation

## 2020-09-07 DIAGNOSIS — I10 Essential (primary) hypertension: Secondary | ICD-10-CM | POA: Diagnosis not present

## 2020-09-07 DIAGNOSIS — I480 Paroxysmal atrial fibrillation: Secondary | ICD-10-CM | POA: Insufficient documentation

## 2020-09-07 DIAGNOSIS — S4992XA Unspecified injury of left shoulder and upper arm, initial encounter: Secondary | ICD-10-CM | POA: Diagnosis present

## 2020-09-07 DIAGNOSIS — S80212A Abrasion, left knee, initial encounter: Secondary | ICD-10-CM | POA: Diagnosis not present

## 2020-09-07 DIAGNOSIS — Z79899 Other long term (current) drug therapy: Secondary | ICD-10-CM | POA: Diagnosis not present

## 2020-09-07 DIAGNOSIS — N183 Chronic kidney disease, stage 3 unspecified: Secondary | ICD-10-CM | POA: Insufficient documentation

## 2020-09-07 DIAGNOSIS — S5012XA Contusion of left forearm, initial encounter: Secondary | ICD-10-CM | POA: Diagnosis not present

## 2020-09-07 DIAGNOSIS — Z952 Presence of prosthetic heart valve: Secondary | ICD-10-CM | POA: Diagnosis not present

## 2020-09-07 DIAGNOSIS — E782 Mixed hyperlipidemia: Secondary | ICD-10-CM | POA: Diagnosis not present

## 2020-09-07 DIAGNOSIS — J4520 Mild intermittent asthma, uncomplicated: Secondary | ICD-10-CM | POA: Diagnosis not present

## 2020-09-07 DIAGNOSIS — I6522 Occlusion and stenosis of left carotid artery: Secondary | ICD-10-CM

## 2020-09-07 DIAGNOSIS — I5043 Acute on chronic combined systolic (congestive) and diastolic (congestive) heart failure: Secondary | ICD-10-CM | POA: Insufficient documentation

## 2020-09-07 DIAGNOSIS — R0602 Shortness of breath: Secondary | ICD-10-CM | POA: Insufficient documentation

## 2020-09-07 DIAGNOSIS — I701 Atherosclerosis of renal artery: Secondary | ICD-10-CM

## 2020-09-07 MED ORDER — ACETAMINOPHEN 325 MG PO TABS
650.0000 mg | ORAL_TABLET | Freq: Four times a day (QID) | ORAL | Status: DC | PRN
  Administered 2020-09-07: 650 mg via ORAL
  Filled 2020-09-07: qty 2

## 2020-09-07 MED ORDER — FUROSEMIDE 20 MG PO TABS: ORAL_TABLET | ORAL | 1 refills | Status: DC

## 2020-09-07 NOTE — Assessment & Plan Note (Signed)
INRs have been supratherapeutic in the high 3 range.  We will have our pharmacist evaluate

## 2020-09-07 NOTE — Assessment & Plan Note (Signed)
History of essential hypertension a blood pressure measured today 148/82.  She is on metoprolol.

## 2020-09-07 NOTE — Patient Instructions (Signed)
Medication Instructions:  Increase Lasix to 40 mg daily for 1 week, then decrease back to 20 mg daily after.   *If you need a refill on your cardiac medications before your next appointment, please call your pharmacy*   Lab Work: BMET, BNP in 1 week   If you have labs (blood work) drawn today and your tests are completely normal, you will receive your results only by: [place] (if you have MyChart) OR A paper copy in the mail If you have any lab test that is abnormal or we need to change your treatment, we will call you to review the results.   Testing/Procedures: Echocardiogram - Your physician has requested that you have an echocardiogram. Echocardiography is a painless test that uses sound waves to create images of your heart. It provides your doctor with information about the size and shape of your heart and how well your heart's chambers and valves are working. This procedure takes approximately one hour. There are no restrictions for this procedure. This will be performed at our [facility] location - [address], [address].    Follow-Up: At [facility], you and your health needs are our priority.  As part of our continuing mission to provide you with exceptional heart care, we have created designated Provider Care Teams.  These Care Teams include your primary Cardiologist (physician) and Advanced Practice Providers (APPs -  Physician Assistants and Nurse Practitioners) who all work together to provide you with the care you need, when you need it.  We recommend signing up for the patient portal called "MyChart".  Sign up information is provided on this After Visit Summary.  MyChart is used to connect with patients for Virtual Visits (Telemedicine).  Patients are able to view lab/test results, encounter notes, upcoming appointments, etc.  Non-urgent messages can be sent to your provider as well.   To learn more about what you can do with MyChart, go to  [email or web address].    Your next appointment:   August 31st at 3:15 PM with Dr.Berry

## 2020-09-07 NOTE — Discharge Instructions (Addendum)
Your x-rays on today's visit were negative for any acute fracture.  You may continue to apply ice, elevate, rest, take Tylenol to help with your pain.  We also dispensed a walker for you, should you continue to experience pain and no improvement in your symptoms you will need follow-up with orthopedics.

## 2020-09-07 NOTE — Assessment & Plan Note (Signed)
History of renal artery stenosis with known occluded left renal artery and recent renal Doppler studies performed 07/26/2020 revealing a widely patent right renal artery.

## 2020-09-07 NOTE — Assessment & Plan Note (Signed)
Complete heart block status post [facility] permanent transvenous pacemaker January 2015 followed by Dr. Rayann Heman today she is a sensed and V paced.

## 2020-09-07 NOTE — Assessment & Plan Note (Signed)
History of hyperlipidemia on statin therapy with lipid profile performed 12/02/2019 revealing a total cholesterol 174, LDL 54 HDL of 99.

## 2020-09-07 NOTE — Progress Notes (Signed)
09/07/2020 [name]   April 01, 1950  [id number]  Primary Physician [name], MD Primary Cardiologist: [name] MD FACP, [place], [place], [name]  HPI:  [name] is a 70 y.o.  moderately overweight, almost divorced Caucasian female mother of 87, grandmother to 3 grandchildren who I last saw in the office 12/02/2019.  She had a [facility] AVR performed by Dr. Ceasar Mons September of 2006 because of severe aortic insufficiency. She had normal coronary arteries by cath at that time. A look at her renal arteries and demonstrated a 60% right renal artery stenosis and patent accessory left renal arteries. By duplex she does have a non-functioning left kidney. Her other problems include hypertension and hyperlipidemia. She also has a history of lupus followed by Dr. Jalene Mullet. An echo performed a year ago showed a well-functioning aortic prosthesis, and recent renal Dopplers revealed a widely patent right renal artery. She had a small bowel obstruction and underwent surgery at [facility] for perforated small bowel. She had an exploratory laparotomy with ileostomy and a right hemicolectomy 04/12/12. She saw [name] PAC back in our office 06/12/12 and her medicines were adjusted.   She was admitted in early January for symptomatic complete heart block underwent permanent pacemaker insertion by Dr. Thompson Grayer.[name] She was remitted approximately week later with orthostatic hypotension and was dehydrated. She responded to fluid resuscitation. Since I saw her a year ago she was admitted with heart failure requiring diuresis secondary to diastolic dysfunction and dietary indiscretion. In addition, she recently underwent an cardiac catheterization by Dr. [name] because of chest pain 04/19/15  revealing normal coronary arteries.  She was admitted to [facility] of volume overload 12/21/15 and was diuresed     She was complaining of increasing dyspnea.  She was found to be in A. fib and was  admitted for Tikosyn load on 04/01/2018.  She converted to sinus rhythm and feels clinically improved.   She  continues to do well ostensibly maintaining sinus rhythm on Tikosyn and warfarin.  She did see Dr. Rayann Heman back who confirmed continued improvement.  She does have diastolic heart failure on diuretics with grade 1 diastolic dysfunction on 2D echo 05/24/2017.  Recent carotid and renal Doppler studies showed an occluded left renal artery with a widely patent right renal artery and an occluded left internal carotid with a widely patent right.   She is followed in the A. fib clinic extensively maintaining sinus rhythm after her Tikosyn load.  Her most recent 2D echo performed 08/26/2019 revealed an EF of 40% with a well-functioning aortic mechanical prosthesis on Coumadin anticoagulation.   Since I saw her 10 months ago she has done well until several weeks ago when she developed some orthopnea and dyspnea.  She also had some abdominal pain.  Her INRs are running on the high side.  She did see her primary care doctor yesterday who did a chest x-ray and doubled her furosemide from 20 to 40 mg a day for several days.  She does have increased jugular venous pressure on exam but no peripheral edema.   Current Meds  Medication Sig   acetaminophen (TYLENOL) 325 MG tablet Take 650 mg by mouth every 6 (six) hours as needed for moderate pain.    albuterol (PROVENTIL HFA;VENTOLIN HFA) 108 (90 Base) MCG/ACT inhaler Inhale 2 puffs into the lungs every 6 (six) hours as needed for wheezing or shortness of breath.   allopurinol (ZYLOPRIM) 100 MG tablet Take  100 mg by mouth daily.    calcium carbonate (OSCAL) 1500 (600 Ca) MG TABS tablet Take 1 tablet by mouth 2 (two) times daily.   cetirizine (ZYRTEC) 10 MG tablet Take 10 mg by mouth at bedtime.   chlorhexidine (PERIDEX) 0.12 % solution Use as directed 10 mLs in the mouth or throat 2 (two) times daily as needed (before dental appts).    cholecalciferol (VITAMIN D)  1000 UNITS tablet Take 2,000 Units by mouth 2 (two) times daily.    clotrimazole-betamethasone (LOTRISONE) cream as needed.   colestipol (COLESTID) 1 g tablet Take 1 g by mouth daily.   dicyclomine (BENTYL) 20 MG tablet Take by mouth.   dofetilide (TIKOSYN) 250 MCG capsule Take 1 capsule by mouth twice daily   Fluticasone-Salmeterol (WIXELA INHUB) 250-50 MCG/DOSE AEPB Inhale 1 puff into the lungs daily.   hydroxychloroquine (PLAQUENIL) 200 MG tablet Take 200 mg by mouth daily.   isosorbide mononitrate (IMDUR) 30 MG 24 hr tablet TAKE 1 TABLET BY MOUTH  DAILY   leflunomide (ARAVA) 10 MG tablet Take 10 mg by mouth daily.   loratadine (CLARITIN) 10 MG tablet Take 10 mg by mouth daily.   Magnesium 200 MG TABS Take 1 tablet (200 mg total) by mouth daily.   metoprolol succinate (TOPROL-XL) 50 MG 24 hr tablet TAKE 1 TABLET BY MOUTH  DAILY WITH OR IMMEDIATELY  FOLLOWING A MEAL   potassium chloride SA (KLOR-CON) 20 MEQ tablet TAKE 1  BY MOUTH TWICE DAILY   predniSONE (DELTASONE) 5 MG tablet Take 5 mg by mouth daily.   Probiotic Product (PROBIOTIC DAILY PO) Take 1 tablet by mouth daily.   Saccharomyces boulardii (PROBIOTIC) 250 MG CAPS 1 capsule   simvastatin (ZOCOR) 20 MG tablet Take 1 tablet by mouth at  bedtime   warfarin (COUMADIN) 5 MG tablet TAKE 1 TO 1 AND 1/2 TABLETS BY MOUTH DAILY AS DIRECTED  BY COUMADIN CLINIC   [DISCONTINUED] furosemide (LASIX) 20 MG tablet Take 1 tablet (20 mg total) by mouth daily.     Allergies  Allergen Reactions   Spironolactone Other (See Comments)    Hyperkalemia    Social History   Socioeconomic History   Marital status: Divorced    Spouse name: Not on file   Number of children: 3   Years of education: Not on file   Highest education level: Not on file  Occupational History   Occupation: Retired  Tobacco Use   Smoking status: Never   Smokeless tobacco: Never  Vaping Use   Vaping Use: Never used  Substance and Sexual Activity   Alcohol use: No    Drug use: No   Sexual activity: Not on file  Other Topics Concern   Not on file  Social History Narrative   Lives alone in [place].   Social Determinants of Health   Financial Resource Strain: Not on file  Food Insecurity: Not on file  Transportation Needs: Not on file  Physical Activity: Not on file  Stress: Not on file  Social Connections: Not on file  Intimate Partner Violence: Not on file     Review of Systems: General: negative for chills, fever, night sweats or weight changes.  Cardiovascular: negative for chest pain, dyspnea on exertion, edema, orthopnea, palpitations, paroxysmal nocturnal dyspnea or shortness of breath Dermatological: negative for rash Respiratory: negative for cough or wheezing Urologic: negative for hematuria Abdominal: negative for nausea, vomiting, diarrhea, bright red blood per rectum, melena, or hematemesis Neurologic: negative for visual changes, syncope,  or dizziness All other systems reviewed and are otherwise negative except as noted above.    Blood pressure (!) 148/82, pulse 79, height '5\' 5"'$  (1.651 m), weight 262 lb 6.4 oz (119 kg), SpO2 97 %.  General appearance: alert and no distress Neck: no adenopathy, no carotid bruit, no JVD, supple, symmetrical, trachea midline, and thyroid not enlarged, symmetric, no tenderness/mass/nodules Lungs: clear to auscultation bilaterally Heart: Crisp prosthetic valve sounds Extremities: extremities normal, atraumatic, no cyanosis or edema Pulses: 2+ and symmetric Skin: Skin color, texture, turgor normal. No rashes or lesions Neurologic: Grossly normal  EKG atrial sensed ventricular paced rhythm at 79.  ASSESSMENT AND PLAN:   Anticoagulated on Coumadin for mechanical valve INRs have been supratherapeutic in the high 3 range.  We will have our pharmacist evaluate  Complete heart block ([place]) Complete heart block status post [place] permanent transvenous pacemaker January 2015 followed by Dr.  Rayann Heman today she is a sensed and V paced.  S/P AVR (aortic valve replacement) History of aortic insufficiency status post [facility] Jude AVR by Dr. [name] September 2006.  She had normal coronary arteries.  She is followed by 2D echocardiography on a regular basis most recently 12/31/2019 which time she had an EF of 40 to 45%, moderate pulmonary hypertension with a well-functioning aortic mechanical prosthesis (23 mm [employer] mechanical valve).  Hyperlipidemia History of hyperlipidemia on statin therapy with lipid profile performed 12/02/2019 revealing a total cholesterol 174, LDL 54 HDL of 99.  Essential hypertension History of essential hypertension a blood pressure measured today 148/82.  She is on metoprolol.  Renal artery stenosis (HCC) History of renal artery stenosis with known occluded left renal artery and recent renal Doppler studies performed 07/26/2020 revealing a widely patent right renal artery.  Carotid stenosis History of carotid artery disease status post left carotid endarterectomy in 2000 with carotid Dopplers performed 07/26/2020 revealing a widely patent right carotid with occluded left carotid artery.  This will be repeated on an annual basis.  Acute on chronic combined systolic and diastolic CHF (congestive heart failure) ([place]) Ms. Gogan has a nonischemic cardiomyopathy with an EF in the 40% range and diastolic dysfunction.  Over the last 3 weeks or so she has had orthopnea and increasing dyspnea.  She saw her PCP yesterday who doubled her Lasix from 20 to 40 mg a day for several days.  We will check a BMP and BNP in 7 to 10 days.  I will recheck a 2D echocardiogram and see her back in 6 weeks.  Persistent atrial fibrillation (HCC) History of PAF successfully converted to sinus rhythm with Tikosyn load followed by Dr. Rayann Heman and the A. fib clinic.  Today she is atrial atrial sensed and ventricular paced.      [name] MD FACP,FACC,FAHA, [facility] 09/07/2020 11:28  AM

## 2020-09-07 NOTE — Assessment & Plan Note (Signed)
History of carotid artery disease status post left carotid endarterectomy in 2000 with carotid Dopplers performed 07/26/2020 revealing a widely patent right carotid with occluded left carotid artery.  This will be repeated on an annual basis.

## 2020-09-07 NOTE — Assessment & Plan Note (Signed)
[name] has a nonischemic cardiomyopathy with an EF in the 40% range and diastolic dysfunction.  Over the last 3 weeks or so she has had orthopnea and increasing dyspnea.  She saw her PCP yesterday who doubled her Lasix from 20 to 40 mg a day for several days.  We will check a BMP and BNP in 7 to 10 days.  I will recheck a 2D echocardiogram and see her back in 6 weeks.

## 2020-09-07 NOTE — ED Triage Notes (Signed)
Per EMS-was at MD's office-tripped going into exam room-complaining of left knee and right arm pain, above elbow-no deformity.  Bruising to left knee and pain with movement

## 2020-09-07 NOTE — Assessment & Plan Note (Signed)
History of aortic insufficiency status post [facility] Jude AVR by Dr. [name] September 2006.  She had normal coronary arteries.  She is followed by 2D echocardiography on a regular basis most recently 12/31/2019 which time she had an EF of 40 to 45%, moderate pulmonary hypertension with a well-functioning aortic mechanical prosthesis (23 mm [employer] mechanical valve).

## 2020-09-07 NOTE — Assessment & Plan Note (Signed)
History of PAF successfully converted to sinus rhythm with Tikosyn load followed by Dr. Rayann Heman and the A. fib clinic.  Today she is atrial atrial sensed and ventricular paced.

## 2020-09-07 NOTE — ED Provider Notes (Signed)
[facility] DEPT Provider Note   CSN: [id number] Arrival date & time: 09/07/20  1250     History Chief Complaint  Patient presents with   Lytle Michaels    [name] is a 69 y.o. female.  70 y.o female with a PMH of Asthma, CAD, CHF presents to the ED with a chief complaint of left knee, left elbow pain s/p fall.  Patient was walking into the examination room at her cardiologist office Dr. Alvester Chou, when she suddenly tripped on the ledge of the door, causing her to fall forward, landing mostly on the left forearm, left knee. She has not ambulated since the incident occurred. She had ice applied to left forearm along with left knee without much improvement in symptoms. She endorses worse pain to her left knee along with swelling, pain is exacerbated with movement. She denies any LOC, Headache or hitting her head.  Of note, patient evaluated by cardiology for shortness of breath along with significant cardiac history and was advised to increase her fluid pills.     The history is provided by the patient and medical records.  Fall This is a new problem. The current episode started less than 1 hour ago. The problem occurs constantly. Associated symptoms include shortness of breath (has been SOB for a week). Pertinent negatives include no chest pain, no abdominal pain and no headaches.      Past Medical History:  Diagnosis Date   Antiphospholipid antibody positive 06/28/2017   Asthma    CAD (coronary artery disease)    a. normal cors by cath in 03/2015   Carotid artery disease ([place])    Chronic anticoagulation    Complete heart block ([place]) 02/2013   a. Implantation of a dual chamber pacemaker in 02/2013 by Dr Rayann Heman. STJ model number [id number] pacemaker with model number [id number] right ventricular lead.    Dyslipidemia    H/O Doppler ultrasound 10/03/2010   carotid duplex - R ICA normal patency; L CCA-ICA bypass gradt patent common to interal cartoid bypass graft    H/O  mechanical aortic valve replacement    History of echocardiogram 11/08/2011   EF >55%; mild-mod concentric LVH; trace aortic regurg.; LA mild-mod dilated   History of nuclear stress test 01/20/2010   normal pattern of perfusion; low risk scan   Hypertension    Lupus (systemic lupus erythematosus) (HCC)    PVD (peripheral vascular disease) (HCC)    60% R renal artery stenosis; non-functioning L kidney    Patient Active Problem List   Diagnosis Date Noted   Secondary hypercoagulable state ([place]) 04/08/2019   Persistent atrial fibrillation ([place]) 04/01/2018   PAF (paroxysmal atrial fibrillation) ([place]) 03/17/2018   Antiphospholipid antibody positive 06/28/2017   Hypervolemia 12/21/2015   Palpitations 12/21/2015   CHF (congestive heart failure) ([place]) 12/21/2015   Chest pain with moderate risk for cardiac etiology [id number]   Diastolic dysfunction [id number]   CRI (chronic renal insufficiency), stage 3 (moderate) ([place]) 02/10/2015   Acute on chronic combined systolic and diastolic CHF (congestive heart failure) ([place]) 01/28/2015   Hypokalemia 01/28/2015   Normal coronary arteries 12/09/2014   Morbid obesity ([place]) 12/09/2014   Acute congestive heart failure with left ventricular diastolic dysfunction ([place]) 07/08/2013   Encounter for therapeutic drug monitoring 03/16/2013   Renal artery stenosis ([place]) 03/16/2013   Carotid stenosis 03/16/2013   Cardiac pacemaker in situ - St Jude 03/16/2013   Orthostatic hypotension 03/09/2013   Dizziness 03/08/2013   Dizziness of unknown cause 03/08/2013  Symptomatic bradycardia 03/06/2013   Asystole, with syncope 03/06/2013   Anticoagulated on Coumadin for mechanical valve 03/06/2013   Complete heart block ([place]) 03/06/2013   Hyperlipidemia 08/28/2012   Essential hypertension 08/28/2012   Long term (current) use of anticoagulants - Coumadin for mechanical AoV 05/09/2012   Seasonal and perennial allergic rhinitis 11/14/2011   Asthma, mild intermittent  08/26/2007   LUPUS (SLE) 08/26/2007   S/P AVR (aortic valve replacement) 08/26/2007    Past Surgical History:  Procedure Laterality Date   AORTIC VALVE REPLACEMENT  11/29/006   [facility]; [place]; on coumadin   CARDIAC CATHETERIZATION  11/01/2004   define anatomy    CARDIAC CATHETERIZATION N/A 04/19/2015   Procedure: Coronary Angiography;  Surgeon: Peter M [name], MD;  Location: [facility];  Service: Cardiovascular;  Laterality: N/A;   CAROTID ENDARTERECTOMY     lt.   CHOLECYSTECTOMY  1999   EXPLORATORY LAPAROTOMY  04/12/2012   small bowel perf; ileostomy & R hemicolectomy    PACEMAKER INSERTION  03/06/2013   SJM Assurity DR PPM implanted by Dr Rayann Heman for complete heart block   PERMANENT PACEMAKER INSERTION N/A 03/06/2013   Procedure: PERMANENT PACEMAKER INSERTION;  Surgeon: Coralyn Mark, MD;  Location: [facility];  Service: Cardiovascular;  Laterality: N/A;   TEMPORARY PACEMAKER INSERTION N/A 03/06/2013   Procedure: TEMPORARY PACEMAKER INSERTION;  Surgeon: Coralyn Mark, MD;  Location: [facility];  Service: Cardiovascular;  Laterality: N/A;   TUBAL LIGATION  11/1982     OB History   No obstetric history on file.     Family History  Problem Relation Age of Onset   Alzheimer's disease Father    Cirrhosis Father    Cancer Father        prostate   Heart attack Neg Hx     Social History   Tobacco Use   Smoking status: Never   Smokeless tobacco: Never  Vaping Use   Vaping Use: Never used  Substance Use Topics   Alcohol use: No   Drug use: No    Home Medications Prior to Admission medications   Medication Sig Start Date End Date Taking? Authorizing Provider  acetaminophen (TYLENOL) 325 MG tablet Take 650 mg by mouth every 6 (six) hours as needed for moderate pain.     [provider]  albuterol (PROVENTIL HFA;VENTOLIN HFA) 108 (90 Base) MCG/ACT inhaler Inhale 2 puffs into the lungs every 6 (six) hours as needed for wheezing or shortness of breath.  06/10/17   Baird Lyons D, MD  allopurinol (ZYLOPRIM) 100 MG tablet Take 100 mg by mouth daily.     [provider]  calcium carbonate (OSCAL) 1500 (600 Ca) MG TABS tablet Take 1 tablet by mouth 2 (two) times daily.    [provider]  cetirizine (ZYRTEC) 10 MG tablet Take 10 mg by mouth at bedtime.    [provider]  chlorhexidine (PERIDEX) 0.12 % solution Use as directed 10 mLs in the mouth or throat 2 (two) times daily as needed (before dental appts).  03/31/15   [provider]  cholecalciferol (VITAMIN D) 1000 UNITS tablet Take 2,000 Units by mouth 2 (two) times daily.     [provider]  clotrimazole-betamethasone (LOTRISONE) cream as needed. 11/04/18   [provider]  colestipol (COLESTID) 1 g tablet Take 1 g by mouth daily.    [provider]  dicyclomine (BENTYL) 20 MG tablet Take by mouth. 07/15/19   [provider]  dofetilide (TIKOSYN) 250 MCG capsule Take 1 capsule by mouth twice daily 07/22/20   Fenton, Clint R, PA  Fluticasone-Salmeterol (WIXELA INHUB) 250-50 MCG/DOSE AEPB Inhale 1 puff into the lungs daily. 11/09/19   Deneise Lever, MD  furosemide (LASIX) 20 MG tablet Take 2 tablets (40 mg) by mouth daily for 7 days, then decrease to 1 tablet (20 mg daily) by mouth daily. 09/07/20   [name], MD  hydroxychloroquine (PLAQUENIL) 200 MG tablet Take 200 mg by mouth daily.    [provider]  isosorbide mononitrate (IMDUR) 30 MG 24 hr tablet TAKE 1 TABLET BY MOUTH  DAILY 12/17/19   [name], MD  leflunomide (ARAVA) 10 MG tablet Take 10 mg by mouth daily.    [provider]  loratadine (CLARITIN) 10 MG tablet Take 10 mg by mouth daily.    [provider]  Magnesium 200 MG TABS Take 1 tablet (200 mg total) by mouth daily. 04/15/18   Sherran Needs, NP  metoprolol succinate (TOPROL-XL) 50 MG 24 hr tablet TAKE 1 TABLET BY MOUTH  DAILY WITH OR IMMEDIATELY  FOLLOWING A MEAL  12/17/19   [name], MD  potassium chloride SA (KLOR-CON) 20 MEQ tablet TAKE 1  BY MOUTH TWICE DAILY 07/06/20   Sherran Needs, NP  predniSONE (DELTASONE) 5 MG tablet Take 5 mg by mouth daily.    [provider]  Probiotic Product (PROBIOTIC DAILY PO) Take 1 tablet by mouth daily.    [provider]  Saccharomyces boulardii (PROBIOTIC) 250 MG CAPS 1 capsule    [provider]  simvastatin (ZOCOR) 20 MG tablet Take 1 tablet by mouth at  bedtime 06/07/15   [name], MD  warfarin (COUMADIN) 5 MG tablet TAKE 1 TO 1 AND 1/2 TABLETS BY MOUTH DAILY AS DIRECTED  BY COUMADIN CLINIC 08/10/20   [name], MD    Allergies    Spironolactone  Review of Systems   Review of Systems  Respiratory:  Positive for shortness of breath (has been SOB for a week).   Cardiovascular:  Negative for chest pain.  Gastrointestinal:  Negative for abdominal pain, nausea and vomiting.  Genitourinary:  Negative for flank pain.  Musculoskeletal:  Positive for arthralgias, back pain and myalgias.  Neurological:  Negative for headaches.  All other systems reviewed and are negative.  Physical Exam Updated Vital Signs BP (!) 145/79   Pulse 73   Temp (!) 97.5 F (36.4 C) (Oral)   Resp 20   SpO2 97%   Physical Exam Vitals and nursing note reviewed.  Constitutional:      Appearance: Normal appearance.  HENT:     Head: Normocephalic.     Comments: No visible signs of facial abrasions or goose eggs present.     Mouth/Throat:     Mouth: Mucous membranes are moist.  Pulmonary:     Effort: Pulmonary effort is normal.     Breath sounds: No wheezing.     Comments: Lungs are clear to auscultation.  Musculoskeletal:       Arms:     Cervical back: Normal range of motion and neck supple.     Left knee: Effusion, erythema and ecchymosis present. Decreased range of motion. Tenderness present. No medial joint line tenderness.       Legs:     Comments: Bruising along  distal shoulder along with left forearm.Full ROM of the left arm with pain described as soreness.  Large left  knee effusion, pain with movement, swelling and ecchymosis noted.   Skin:    General: Skin is warm and dry.     Findings: Bruising and erythema present.  Neurological:     Mental Status: She is alert and oriented to person, place, and time.    ED Results / Procedures / Treatments   Labs (all labs ordered are listed, but only abnormal results are displayed) Labs Reviewed - No data to display  EKG None  Radiology DG Shoulder Left  Result Date: 09/07/2020 CLINICAL DATA:  Fall EXAM: LEFT SHOULDER - 2+ VIEW COMPARISON:  None. FINDINGS: There is no evidence of acute fracture or dislocation. There is mild glenohumeral and moderate AC joint arthritis. Partially visualized pacemaker generator and leads overlying the left chest. IMPRESSION: No acute fracture or dislocation of the left shoulder. Electronically Signed   By: Maurine Simmering   On: 09/07/2020 14:31   DG Knee Complete 4 Views Left  Result Date: 09/07/2020 CLINICAL DATA:  Fall EXAM: LEFT KNEE - COMPLETE 4+ VIEW COMPARISON:  None. FINDINGS: There is no evidence of acute fracture. There is tricompartment osteoarthritis with mild medial joint space narrowing and chondrocalcinosis. There is no visible joint effusion. There is benign-appearing soft tissue calcification anterior to the proximal tibia, with central lucency. IMPRESSION: No evidence of acute fracture. Tricompartment osteoarthritis with chondrocalcinosis, worst in the medial compartment. Electronically Signed   By: Maurine Simmering   On: 09/07/2020 14:29   DG Humerus Left  Result Date: 09/07/2020 CLINICAL DATA:  Fall EXAM: LEFT HUMERUS - 2+ VIEW COMPARISON:  None. FINDINGS: There is no evidence of fracture or other focal bone lesions. Soft tissues are unremarkable. Partially visualized pacemaker generator overlying the left chest. IMPRESSION: Negative left humerus radiographs.  Electronically Signed   By: Maurine Simmering   On: 09/07/2020 14:30    Procedures Procedures   Medications Ordered in ED Medications  acetaminophen (TYLENOL) tablet 650 mg (650 mg Oral Given 09/07/20 1439)    ED Course  I have reviewed the triage vital signs and the nursing notes.  Pertinent labs & imaging results that were available during my care of the patient were reviewed by me and considered in my medical decision making (see chart for details).    MDM Rules/Calculators/A&P   Patient presents to the ED with a chief complaint of left forearm, left knee pain status post mechanical fall while at her cardiologist Dr. Naida Sleight office.  Patient reports going for a checkup on her shortness of breath, was told to increase her fluid pill.  Reports tripping on the ledge of the door, causing her to fall forward, did not strike her head, did not lose consciousness.  There is pain along the left forearm, left knee, especially with flexion and extension.  She has not ambulated since the incident occurred.  It is on arrival are within normal limits, no tachycardia, oxygen saturation is 97%, she was told that there was some pulmonary edema noted on her prior x-ray, this was addressed by her cardiologist at the appointment.  X-rays were obtained on today's visit of her left shoulder, left humerus, left knee these were all without any acute finding.  She was provided with Tylenol for pain control, states that this is the only medication she can safely take due to the several medication she is in for her cardiac history.  She was also provided with a walker while in the ED.  She was assisted to the bathroom by nursing staff with minimal  assistance.  I feel that she is safe for disposition home.  She will be calling her sons in order to  Reviewed of her visit from cardiology aspect who states: Ms. Keske has a nonischemic cardiomyopathy with an EF in the 40% range and diastolic dysfunction.  Over the last 3  weeks or so she has had orthopnea and increasing dyspnea.  She saw her PCP yesterday who doubled her Lasix from 20 to 40 mg a day for several days.  We will check a BMP and BNP in 7 to 10 days.  I will recheck a 2D echocardiogram and see her back in 6 weeks.  There is no signs of fluid overload on her visit, no calf tenderness, no peripheral edema noted.  She is stable for discharge at this time.  Return precautions discussed at length.   Portions of this note were generated with Lobbyist. Dictation errors may occur despite best attempts at proofreading.  Final Clinical Impression(s) / ED Diagnoses Final diagnoses:  Fall, initial encounter  Acute pain of left knee    Rx / DC Orders ED Discharge Orders     None        Janeece Fitting, PA-C 09/07/20 1454    Charlesetta Shanks, MD 09/15/20 [phone]

## 2020-09-08 NOTE — Telephone Encounter (Signed)
Patient seen with Dr.Berry on 07/20

## 2020-09-12 ENCOUNTER — Telehealth: Payer: Self-pay

## 2020-09-12 NOTE — Telephone Encounter (Signed)
LMOM FOR WARFARIN OVERDUE SELF TESTER

## 2020-09-14 ENCOUNTER — Telehealth: Payer: Self-pay

## 2020-09-14 ENCOUNTER — Ambulatory Visit (INDEPENDENT_AMBULATORY_CARE_PROVIDER_SITE_OTHER): Payer: Medicare Other | Admitting: Pharmacist

## 2020-09-14 ENCOUNTER — Ambulatory Visit: Payer: Medicare Other | Admitting: Internal Medicine

## 2020-09-14 DIAGNOSIS — Z5181 Encounter for therapeutic drug level monitoring: Secondary | ICD-10-CM

## 2020-09-14 DIAGNOSIS — Z952 Presence of prosthetic heart valve: Secondary | ICD-10-CM | POA: Diagnosis not present

## 2020-09-14 LAB — POCT INR: INR: 7.9 — AB (ref 2.0–3.0)

## 2020-09-14 NOTE — Telephone Encounter (Signed)
Warfarin overdue self tester

## 2020-09-14 NOTE — Patient Instructions (Signed)
Description   Hold dose today, tomorrow, and Friday.  Eat entire can of spinach.  Repeat INR with lab order on Friday in [place].  Patient unable to come in to have result verified due to swelling in legs.

## 2020-09-14 NOTE — Progress Notes (Signed)
Patient instructed to eat entire can of spinach today.  Will repeat INR Friday. Patient has other outstanding lab orders and she was planning on trying to get done on Friday in [place].  Patient given instructions if she sees signs/symptoms of bleeding to call Coumadin clinic immediately.

## 2020-09-16 ENCOUNTER — Other Ambulatory Visit: Payer: Self-pay

## 2020-09-16 ENCOUNTER — Ambulatory Visit (INDEPENDENT_AMBULATORY_CARE_PROVIDER_SITE_OTHER): Payer: Medicare Other

## 2020-09-16 DIAGNOSIS — Z952 Presence of prosthetic heart valve: Secondary | ICD-10-CM

## 2020-09-16 DIAGNOSIS — Z5181 Encounter for therapeutic drug level monitoring: Secondary | ICD-10-CM

## 2020-09-16 LAB — POCT INR: INR: 3.5 — AB (ref 2.0–3.0)

## 2020-09-16 NOTE — Patient Instructions (Signed)
Take 1 tablet Daily, except 1.5 tablets Tuesday.  Repeat INR 8/3.

## 2020-09-19 ENCOUNTER — Other Ambulatory Visit: Payer: Self-pay

## 2020-09-19 DIAGNOSIS — I480 Paroxysmal atrial fibrillation: Secondary | ICD-10-CM

## 2020-09-20 ENCOUNTER — Encounter: Payer: Self-pay | Admitting: *Deleted

## 2020-09-20 LAB — BRAIN NATRIURETIC PEPTIDE: BNP: 809.4 pg/mL — ABNORMAL HIGH (ref 0.0–100.0)

## 2020-09-20 LAB — BASIC METABOLIC PANEL
BUN/Creatinine Ratio: 23 (ref 12–28)
BUN: 30 mg/dL — ABNORMAL HIGH (ref 8–27)
CO2: 21 mmol/L (ref 20–29)
Calcium: 8.9 mg/dL (ref 8.7–10.3)
Chloride: 102 mmol/L (ref 96–106)
Creatinine, Ser: 1.29 mg/dL — ABNORMAL HIGH (ref 0.57–1.00)
Glucose: 97 mg/dL (ref 65–99)
Potassium: 4.3 mmol/L (ref 3.5–5.2)
Sodium: 141 mmol/L (ref 134–144)
eGFR: 45 mL/min/{1.73_m2} — ABNORMAL LOW (ref >59)

## 2020-09-21 LAB — POCT INR: INR: 3.9 — AB (ref 2.0–3.0)

## 2020-09-22 ENCOUNTER — Ambulatory Visit (INDEPENDENT_AMBULATORY_CARE_PROVIDER_SITE_OTHER): Payer: Medicare Other | Admitting: Cardiology

## 2020-09-22 DIAGNOSIS — Z952 Presence of prosthetic heart valve: Secondary | ICD-10-CM | POA: Diagnosis not present

## 2020-09-22 DIAGNOSIS — Z5181 Encounter for therapeutic drug level monitoring: Secondary | ICD-10-CM

## 2020-09-22 LAB — PROTIME-INR
INR: 3.9 — ABNORMAL HIGH (ref 0.9–1.2)
Prothrombin Time: 38 s — ABNORMAL HIGH (ref 9.1–12.0)

## 2020-09-28 ENCOUNTER — Telehealth: Payer: Self-pay | Admitting: Cardiovascular Disease

## 2020-09-28 ENCOUNTER — Telehealth: Payer: Self-pay | Admitting: *Deleted

## 2020-09-28 ENCOUNTER — Other Ambulatory Visit: Payer: Self-pay | Admitting: Cardiovascular Disease

## 2020-09-28 DIAGNOSIS — I5031 Acute diastolic (congestive) heart failure: Secondary | ICD-10-CM

## 2020-09-28 LAB — POCT INR: INR: 2.8 (ref 2.0–3.0)

## 2020-09-28 MED ORDER — FUROSEMIDE 20 MG PO TABS: 40.0000 mg | ORAL_TABLET | Freq: Every day | ORAL | 3 refills | Status: DC

## 2020-09-28 NOTE — Telephone Encounter (Signed)
-----   Message from [name], MD sent at 09/20/2020  4:06 PM EDT ----- Given increased BNP would probably go up on lasix 20 mg-->40 mg/day. BMET 2 weeks. Needs ROV with APP to eval

## 2020-09-28 NOTE — Telephone Encounter (Signed)
Spoke with pt, aware of recommendations. New script sent to the pharmacy  Lab orders mailed to the pt

## 2020-09-28 NOTE — Telephone Encounter (Signed)
Returned call to [place] at [facility] and verified that the patient will take a 2nd trial of furosemide 40 mg x 7 days then return to furosemide 20 mg daily. She thanked me for returning the call.

## 2020-09-28 NOTE — Telephone Encounter (Signed)
Pt c/o medication issue:  1. Name of Medication: furosemide (LASIX) 20 MG tablet  2. How are you currently taking this medication (dosage and times per day)? Needs clarification  3. Are you having a reaction (difficulty breathing--STAT)? no  4. What is your medication issue? Janett Billow from [facility] states last month they received the same prescription for this medication with the same instructions. She states it says to take 2 tablets for 7 days and decrease to 1 tablet. They need to clarify if the patient is going back up to 2 tablets. Phone: [phone]

## 2020-09-29 ENCOUNTER — Ambulatory Visit (INDEPENDENT_AMBULATORY_CARE_PROVIDER_SITE_OTHER): Payer: Medicare Other | Admitting: Cardiology

## 2020-09-29 DIAGNOSIS — Z952 Presence of prosthetic heart valve: Secondary | ICD-10-CM

## 2020-09-29 DIAGNOSIS — Z5181 Encounter for therapeutic drug level monitoring: Secondary | ICD-10-CM

## 2020-09-29 LAB — BASIC METABOLIC PANEL
BUN/Creatinine Ratio: 22 (ref 12–28)
BUN: 27 mg/dL (ref 8–27)
CO2: 19 mmol/L — ABNORMAL LOW (ref 20–29)
Calcium: 9.6 mg/dL (ref 8.7–10.3)
Chloride: 103 mmol/L (ref 96–106)
Creatinine, Ser: 1.25 mg/dL — ABNORMAL HIGH (ref 0.57–1.00)
Glucose: 96 mg/dL (ref 65–99)
Potassium: 5.3 mmol/L — ABNORMAL HIGH (ref 3.5–5.2)
Sodium: 141 mmol/L (ref 134–144)
eGFR: 47 mL/min/{1.73_m2} — ABNORMAL LOW (ref >59)

## 2020-09-29 LAB — PROTIME-INR
INR: 2.8 — ABNORMAL HIGH (ref 0.9–1.2)
Prothrombin Time: 27.6 s — ABNORMAL HIGH (ref 9.1–12.0)

## 2020-09-30 ENCOUNTER — Other Ambulatory Visit: Payer: Self-pay

## 2020-09-30 DIAGNOSIS — I5031 Acute diastolic (congestive) heart failure: Secondary | ICD-10-CM

## 2020-09-30 NOTE — Progress Notes (Signed)
Placed 2-3 week repeat BMET order per Dr. Gwenlyn Found

## 2020-10-05 ENCOUNTER — Other Ambulatory Visit: Payer: Self-pay | Admitting: Cardiovascular Disease

## 2020-10-06 ENCOUNTER — Ambulatory Visit (INDEPENDENT_AMBULATORY_CARE_PROVIDER_SITE_OTHER): Payer: Medicare Other | Admitting: Pharmacist Clinician (PhC)/ Clinical Pharmacy Specialist

## 2020-10-06 DIAGNOSIS — Z952 Presence of prosthetic heart valve: Secondary | ICD-10-CM

## 2020-10-06 DIAGNOSIS — Z7901 Long term (current) use of anticoagulants: Secondary | ICD-10-CM | POA: Diagnosis not present

## 2020-10-06 DIAGNOSIS — R760 Raised antibody titer: Secondary | ICD-10-CM | POA: Diagnosis not present

## 2020-10-06 DIAGNOSIS — Z5181 Encounter for therapeutic drug level monitoring: Secondary | ICD-10-CM

## 2020-10-06 LAB — PROTIME-INR
INR: 3.2 — ABNORMAL HIGH (ref 0.9–1.2)
Prothrombin Time: 31.4 s — ABNORMAL HIGH (ref 9.1–12.0)

## 2020-10-12 ENCOUNTER — Ambulatory Visit (INDEPENDENT_AMBULATORY_CARE_PROVIDER_SITE_OTHER): Payer: Medicare Other

## 2020-10-12 DIAGNOSIS — I442 Atrioventricular block, complete: Secondary | ICD-10-CM

## 2020-10-12 LAB — CUP PACEART REMOTE DEVICE CHECK
Battery Remaining Longevity: 32 mo
Battery Remaining Percentage: 28 %
Battery Voltage: 2.92 V
Brady Statistic AP VP Percent: 9.6 %
Brady Statistic AP VS Percent: 1 %
Brady Statistic AS VP Percent: 90 %
Brady Statistic AS VS Percent: 1 %
Brady Statistic RA Percent Paced: 8.9 %
Brady Statistic RV Percent Paced: 99 %
Date Time Interrogation Session: 20220824020019
Implantable Lead Implant Date: 20150116
Implantable Lead Implant Date: 20150116
Implantable Lead Location: 753859
Implantable Lead Location: 753860
Implantable Lead Model: 5076
Implantable Pulse Generator Implant Date: 20150116
Lead Channel Impedance Value: 350 Ohm
Lead Channel Impedance Value: 540 Ohm
Lead Channel Pacing Threshold Amplitude: 0.75 V
Lead Channel Pacing Threshold Amplitude: 1.25 V
Lead Channel Pacing Threshold Pulse Width: 0.4 ms
Lead Channel Pacing Threshold Pulse Width: 0.4 ms
Lead Channel Sensing Intrinsic Amplitude: 12 mV
Lead Channel Sensing Intrinsic Amplitude: 4.4 mV
Lead Channel Setting Pacing Amplitude: 1.5 V
Lead Channel Setting Pacing Amplitude: 2 V
Lead Channel Setting Pacing Pulse Width: 0.4 ms
Lead Channel Setting Sensing Sensitivity: 2 mV
Pulse Gen Model: 2240
Pulse Gen Serial Number: 7587004

## 2020-10-14 ENCOUNTER — Other Ambulatory Visit: Payer: Self-pay | Admitting: Cardiovascular Disease

## 2020-10-15 LAB — BASIC METABOLIC PANEL
BUN/Creatinine Ratio: 18 (ref 12–28)
BUN: 31 mg/dL — ABNORMAL HIGH (ref 8–27)
CO2: 26 mmol/L (ref 20–29)
Calcium: 10.6 mg/dL — ABNORMAL HIGH (ref 8.7–10.3)
Chloride: 98 mmol/L (ref 96–106)
Creatinine, Ser: 1.68 mg/dL — ABNORMAL HIGH (ref 0.57–1.00)
Glucose: 99 mg/dL (ref 65–99)
Potassium: 4.9 mmol/L (ref 3.5–5.2)
Sodium: 143 mmol/L (ref 134–144)
eGFR: 33 mL/min/{1.73_m2} — ABNORMAL LOW (ref >59)

## 2020-10-19 ENCOUNTER — Other Ambulatory Visit: Payer: Self-pay

## 2020-10-19 ENCOUNTER — Encounter: Payer: Self-pay | Admitting: Cardiovascular Disease

## 2020-10-19 ENCOUNTER — Ambulatory Visit: Payer: Medicare Other | Admitting: Cardiovascular Disease

## 2020-10-19 ENCOUNTER — Ambulatory Visit (INDEPENDENT_AMBULATORY_CARE_PROVIDER_SITE_OTHER): Payer: Medicare Other

## 2020-10-19 DIAGNOSIS — I5031 Acute diastolic (congestive) heart failure: Secondary | ICD-10-CM

## 2020-10-19 DIAGNOSIS — Z952 Presence of prosthetic heart valve: Secondary | ICD-10-CM | POA: Diagnosis not present

## 2020-10-19 DIAGNOSIS — I5043 Acute on chronic combined systolic (congestive) and diastolic (congestive) heart failure: Secondary | ICD-10-CM | POA: Diagnosis not present

## 2020-10-19 DIAGNOSIS — Z5181 Encounter for therapeutic drug level monitoring: Secondary | ICD-10-CM | POA: Diagnosis not present

## 2020-10-19 LAB — POCT INR: INR: 3.7 — AB (ref 2.0–3.0)

## 2020-10-19 MED ORDER — FUROSEMIDE 20 MG PO TABS: 20.0000 mg | ORAL_TABLET | Freq: Every day | ORAL | 3 refills | Status: DC

## 2020-10-19 NOTE — Addendum Note (Signed)
Addended by: Caprice Beaver T on: 10/19/2020 03:28 PM   Modules accepted: Orders

## 2020-10-19 NOTE — Patient Instructions (Signed)
Continue taking  5 mg daily.  Repeat INR in 2 week.  Eat greens tonight.

## 2020-10-19 NOTE — Patient Instructions (Addendum)
Medication Instructions:  Go back to taking Lasix 20 mg daily- use an extra tablet if you notice weight increasing. 3lbs in a day or 5 lbs in a week.   *If you need a refill on your cardiac medications before your next appointment, please call your pharmacy*   Follow-Up: At [facility], you and your health needs are our priority.  As part of our continuing mission to provide you with exceptional heart care, we have created designated Provider Care Teams.  These Care Teams include your primary Cardiologist (physician) and Advanced Practice Providers (APPs -  Physician Assistants and Nurse Practitioners) who all work together to provide you with the care you need, when you need it.  We recommend signing up for the patient portal called "MyChart".  Sign up information is provided on this After Visit Summary.  MyChart is used to connect with patients for Virtual Visits (Telemedicine).  Patients are able to view lab/test results, encounter notes, upcoming appointments, etc.  Non-urgent messages can be sent to your provider as well.   To learn more about what you can do with MyChart, go to [email or web address].    Your next appointment:   3 month(s)  The format for your next appointment:   In Person  Provider:   You will see one of the following Advanced Practice Providers on your designated Care Team:   Sande Rives, PA-C Coletta Memos, FNP  Then, Quay Burow, MD will plan to see you again in 6 month(s).

## 2020-10-19 NOTE — Progress Notes (Signed)
10/19/2020 [name]   April 22, 1950  [id number]  Primary Physician [name], MD Primary Cardiologist: [name] MD FACP, [place], [place], [name]  HPI:  [name] is a 70 y.o.   moderately overweight, almost divorced Caucasian female mother of 34, grandmother to 3 grandchildren who I last saw in the office 09/07/2020.  She had a St. Jude AVR performed by Dr. Ceasar Mons September of 2006 because of severe aortic insufficiency. She had normal coronary arteries by cath at that time. A look at her renal arteries and demonstrated a 60% right renal artery stenosis and patent accessory left renal arteries. By duplex she does have a non-functioning left kidney. Her other problems include hypertension and hyperlipidemia. She also has a history of lupus followed by Dr. Jalene Mullet. An echo performed a year ago showed a well-functioning aortic prosthesis, and recent renal Dopplers revealed a widely patent right renal artery. She had a small bowel obstruction and underwent surgery at [facility] for perforated small bowel. She had an exploratory laparotomy with ileostomy and a right hemicolectomy 04/12/12. She saw [name] PAC back in our office 06/12/12 and her medicines were adjusted.   She was admitted in early January for symptomatic complete heart block underwent permanent pacemaker insertion by Dr. Thompson Grayer.[name] She was remitted approximately week later with orthostatic hypotension and was dehydrated. She responded to fluid resuscitation. Since I saw her a year ago she was admitted with heart failure requiring diuresis secondary to diastolic dysfunction and dietary indiscretion. In addition, she recently underwent an cardiac catheterization by Dr. [name] because of chest pain 04/19/15  revealing normal coronary arteries.  She was admitted to [facility] of volume overload 12/21/15 and was diuresed     She was complaining of increasing dyspnea.  She was found to be in A. fib and was  admitted for Tikosyn load on 04/01/2018.  She converted to sinus rhythm and feels clinically improved.   She  continues to do well ostensibly maintaining sinus rhythm on Tikosyn and warfarin.  She did see Dr. Rayann Heman back who confirmed continued improvement.  She does have diastolic heart failure on diuretics with grade 1 diastolic dysfunction on 2D echo 05/24/2017.  Recent carotid and renal Doppler studies showed an occluded left renal artery with a widely patent right renal artery and an occluded left internal carotid with a widely patent right.   She is followed in the A. fib clinic extensively maintaining sinus rhythm after her Tikosyn load.  Her most recent 2D echo performed 08/26/2019 revealed an EF of 40% with a well-functioning aortic mechanical prosthesis on Coumadin anticoagulation.    Since I saw her in the office 6 weeks ago she feels markedly improved on increased dose of furosemide.  She is lost 20 pounds.  She has no peripheral edema.  Her serum creatinine did however increased to 1.68 and have asked her to go back to 20 mg a day.  A 2D echo was scheduled to be performed in October.   Current Meds  Medication Sig   acetaminophen (TYLENOL) 325 MG tablet Take 650 mg by mouth every 6 (six) hours as needed for moderate pain.    albuterol (PROVENTIL HFA;VENTOLIN HFA) 108 (90 Base) MCG/ACT inhaler Inhale 2 puffs into the lungs every 6 (six) hours as needed for wheezing or shortness of breath.   allopurinol (ZYLOPRIM) 100 MG tablet Take 100 mg by mouth daily.    calcium carbonate (OSCAL) 1500 (600 Ca) MG  TABS tablet Take 1 tablet by mouth 2 (two) times daily.   cetirizine (ZYRTEC) 10 MG tablet Take 10 mg by mouth at bedtime.   chlorhexidine (PERIDEX) 0.12 % solution Use as directed 10 mLs in the mouth or throat 2 (two) times daily as needed (before dental appts).    cholecalciferol (VITAMIN D) 1000 UNITS tablet Take 2,000 Units by mouth 2 (two) times daily.    clotrimazole-betamethasone  (LOTRISONE) cream as needed.   colestipol (COLESTID) 1 g tablet Take 1 g by mouth daily.   dicyclomine (BENTYL) 20 MG tablet Take by mouth.   dofetilide (TIKOSYN) 250 MCG capsule Take 1 capsule by mouth twice daily   Ferrous Fumarate-Folic Acid (HEMOCYTE-F) 324-1 MG TABS Take 1 tablet by mouth daily.   Fluticasone-Salmeterol (WIXELA INHUB) 250-50 MCG/DOSE AEPB Inhale 1 puff into the lungs daily.   furosemide (LASIX) 20 MG tablet Take 2 tablets (40 mg total) by mouth daily. Take 2 tablets (40 mg) by mouth daily for 7 days, then decrease to 1 tablet (20 mg daily) by mouth daily.   hydroxychloroquine (PLAQUENIL) 200 MG tablet Take 200 mg by mouth daily.   isosorbide mononitrate (IMDUR) 30 MG 24 hr tablet TAKE 1 TABLET BY MOUTH  DAILY   leflunomide (ARAVA) 10 MG tablet Take 10 mg by mouth daily.   loratadine (CLARITIN) 10 MG tablet Take 10 mg by mouth daily.   Magnesium 200 MG TABS Take 1 tablet (200 mg total) by mouth daily.   metoprolol succinate (TOPROL-XL) 50 MG 24 hr tablet TAKE 1 TABLET BY MOUTH  DAILY WITH OR IMMEDIATELY  FOLLOWING A MEAL   potassium chloride SA (KLOR-CON) 20 MEQ tablet TAKE 1  BY MOUTH TWICE DAILY   predniSONE (DELTASONE) 5 MG tablet Take 5 mg by mouth daily.   Probiotic Product (PROBIOTIC DAILY PO) Take 1 tablet by mouth daily.   Saccharomyces boulardii (PROBIOTIC) 250 MG CAPS 1 capsule   simvastatin (ZOCOR) 20 MG tablet Take 1 tablet by mouth at  bedtime   warfarin (COUMADIN) 5 MG tablet TAKE 1 TO 1 AND 1/2 TABLETS BY MOUTH DAILY AS DIRECTED  BY COUMADIN CLINIC     Allergies  Allergen Reactions   Spironolactone Other (See Comments)    Hyperkalemia    Social History   Socioeconomic History   Marital status: Divorced    Spouse name: Not on file   Number of children: 3   Years of education: Not on file   Highest education level: Not on file  Occupational History   Occupation: Retired  Tobacco Use   Smoking status: Never   Smokeless tobacco: Never  Vaping  Use   Vaping Use: Never used  Substance and Sexual Activity   Alcohol use: No   Drug use: No   Sexual activity: Not on file  Other Topics Concern   Not on file  Social History Narrative   Lives alone in [place].   Social Determinants of Health   Financial Resource Strain: Not on file  Food Insecurity: Not on file  Transportation Needs: Not on file  Physical Activity: Not on file  Stress: Not on file  Social Connections: Not on file  Intimate Partner Violence: Not on file     Review of Systems: General: negative for chills, fever, night sweats or weight changes.  Cardiovascular: negative for chest pain, dyspnea on exertion, edema, orthopnea, palpitations, paroxysmal nocturnal dyspnea or shortness of breath Dermatological: negative for rash Respiratory: negative for cough or wheezing Urologic: negative for  hematuria Abdominal: negative for nausea, vomiting, diarrhea, bright red blood per rectum, melena, or hematemesis Neurologic: negative for visual changes, syncope, or dizziness All other systems reviewed and are otherwise negative except as noted above.    Blood pressure 135/83, pulse 71, height '5\' 5"'$  (1.651 m), weight 243 lb 3.2 oz (110.3 kg), SpO2 96 %.  General appearance: alert and no distress Neck: no adenopathy, no carotid bruit, no JVD, supple, symmetrical, trachea midline, and thyroid not enlarged, symmetric, no tenderness/mass/nodules Lungs: clear to auscultation bilaterally Heart: regular rate and rhythm, S1, S2 normal, no murmur, click, rub or gallop Extremities: extremities normal, atraumatic, no cyanosis or edema Pulses: 2+ and symmetric Skin: Skin color, texture, turgor normal. No rashes or lesions Neurologic: Grossly normal  EKG not performed today  ASSESSMENT AND PLAN:   Acute on chronic combined systolic and diastolic CHF (congestive heart failure) (HCC) Ms. Hardnett returns today for follow-up of volume overload.  She does have a history of  nonischemic cardiomyopathy with an ejection fraction in the 40% range and diastolic dysfunction.  She was short of breath,, had an elevated BN P and jugular venous distention.  I increased her furosemide from 20 to 40 mg a day which resulted in marked improvement in her symptoms and loss of 20 pounds.  Her serum creatinine did however go up to 1.68.  I have asked her to go back to 20 mg a day.  2D echo is pending for October.     [name] MD FACP,FACC,FAHA, [facility] 10/19/2020 3:19 PM

## 2020-10-19 NOTE — Assessment & Plan Note (Signed)
[name] returns today for follow-up of volume overload.  She does have a history of nonischemic cardiomyopathy with an ejection fraction in the 40% range and diastolic dysfunction.  She was short of breath,, had an elevated BN P and jugular venous distention.  I increased her furosemide from 20 to 40 mg a day which resulted in marked improvement in her symptoms and loss of 20 pounds.  Her serum creatinine did however go up to 1.68.  I have asked her to go back to 20 mg a day.  2D echo is pending for October.

## 2020-10-21 ENCOUNTER — Ambulatory Visit: Payer: Medicare Other | Admitting: Internal Medicine

## 2020-10-27 ENCOUNTER — Ambulatory Visit: Payer: Medicare Other | Admitting: Internal Medicine

## 2020-10-27 NOTE — Progress Notes (Signed)
Remote pacemaker transmission.   

## 2020-10-31 ENCOUNTER — Other Ambulatory Visit: Payer: Self-pay | Admitting: *Deleted

## 2020-10-31 DIAGNOSIS — I5043 Acute on chronic combined systolic (congestive) and diastolic (congestive) heart failure: Secondary | ICD-10-CM

## 2020-10-31 DIAGNOSIS — I5031 Acute diastolic (congestive) heart failure: Secondary | ICD-10-CM

## 2020-11-02 ENCOUNTER — Telehealth: Payer: Self-pay

## 2020-11-02 ENCOUNTER — Other Ambulatory Visit: Payer: Self-pay | Admitting: Cardiovascular Disease

## 2020-11-02 LAB — POCT INR: INR: 1.6 — AB (ref 2.0–3.0)

## 2020-11-02 NOTE — Telephone Encounter (Signed)
Lmom for inr

## 2020-11-03 ENCOUNTER — Ambulatory Visit (INDEPENDENT_AMBULATORY_CARE_PROVIDER_SITE_OTHER): Payer: Medicare Other | Admitting: Cardiology

## 2020-11-03 DIAGNOSIS — Z5181 Encounter for therapeutic drug level monitoring: Secondary | ICD-10-CM | POA: Diagnosis not present

## 2020-11-03 DIAGNOSIS — Z952 Presence of prosthetic heart valve: Secondary | ICD-10-CM | POA: Diagnosis not present

## 2020-11-03 LAB — PROTIME-INR
INR: 1.6 — ABNORMAL HIGH (ref 0.9–1.2)
Prothrombin Time: 16.4 s — ABNORMAL HIGH (ref 9.1–12.0)

## 2020-11-03 LAB — BASIC METABOLIC PANEL
BUN/Creatinine Ratio: 19 (ref 12–28)
BUN: 26 mg/dL (ref 8–27)
CO2: 21 mmol/L (ref 20–29)
Calcium: 9.4 mg/dL (ref 8.7–10.3)
Chloride: 105 mmol/L (ref 96–106)
Creatinine, Ser: 1.34 mg/dL — ABNORMAL HIGH (ref 0.57–1.00)
Glucose: 110 mg/dL — ABNORMAL HIGH (ref 65–99)
Potassium: 5.1 mmol/L (ref 3.5–5.2)
Sodium: 140 mmol/L (ref 134–144)
eGFR: 43 mL/min/{1.73_m2} — ABNORMAL LOW (ref >59)

## 2020-11-03 NOTE — Patient Instructions (Addendum)
Description   Take 2 tablets tonight and 1 and 1/2 tablets on Friday and then continue taking  5 mg daily.  Repeat INR in 1 week.

## 2020-11-07 ENCOUNTER — Ambulatory Visit: Payer: Medicare Other | Admitting: Internal Medicine

## 2020-11-10 ENCOUNTER — Other Ambulatory Visit: Payer: Self-pay | Admitting: Internal Medicine

## 2020-11-10 ENCOUNTER — Ambulatory Visit (INDEPENDENT_AMBULATORY_CARE_PROVIDER_SITE_OTHER): Payer: Medicare Other | Admitting: Cardiology

## 2020-11-10 DIAGNOSIS — Z5181 Encounter for therapeutic drug level monitoring: Secondary | ICD-10-CM | POA: Diagnosis not present

## 2020-11-10 LAB — POCT INR: INR: 2.7 (ref 2.0–3.0)

## 2020-11-10 NOTE — Patient Instructions (Signed)
Description   Called and spoke to pt and instructed her to continue to take warfarin 1 tablet daily. Recheck INR in 1 week. [facility] [phone]

## 2020-11-17 ENCOUNTER — Telehealth: Payer: Self-pay

## 2020-11-17 NOTE — Telephone Encounter (Signed)
Lmom for overdue inr 

## 2020-11-18 ENCOUNTER — Ambulatory Visit (INDEPENDENT_AMBULATORY_CARE_PROVIDER_SITE_OTHER): Payer: Medicare Other | Admitting: Cardiology

## 2020-11-18 DIAGNOSIS — Z952 Presence of prosthetic heart valve: Secondary | ICD-10-CM

## 2020-11-18 DIAGNOSIS — Z5181 Encounter for therapeutic drug level monitoring: Secondary | ICD-10-CM

## 2020-11-18 LAB — POCT INR: INR: 2.4 (ref 2.0–3.0)

## 2020-11-21 ENCOUNTER — Other Ambulatory Visit: Payer: Self-pay | Admitting: Cardiovascular Disease

## 2020-11-21 DIAGNOSIS — I4729 Other ventricular tachycardia: Secondary | ICD-10-CM

## 2020-11-22 ENCOUNTER — Other Ambulatory Visit: Payer: Self-pay | Admitting: *Deleted

## 2020-11-22 MED ORDER — FLUTICASONE-SALMETEROL 250-50 MCG/ACT IN AEPB: INHALATION_SPRAY | RESPIRATORY_TRACT | 3 refills | Status: DC

## 2020-11-24 LAB — POCT INR: INR: 2.8 (ref 2.0–3.0)

## 2020-11-25 ENCOUNTER — Other Ambulatory Visit: Payer: Self-pay | Admitting: *Deleted

## 2020-11-25 ENCOUNTER — Ambulatory Visit (INDEPENDENT_AMBULATORY_CARE_PROVIDER_SITE_OTHER): Payer: Medicare Other | Admitting: Cardiovascular Disease

## 2020-11-25 DIAGNOSIS — Z5181 Encounter for therapeutic drug level monitoring: Secondary | ICD-10-CM

## 2020-11-25 DIAGNOSIS — Z952 Presence of prosthetic heart valve: Secondary | ICD-10-CM | POA: Diagnosis not present

## 2020-11-25 MED ORDER — FLUTICASONE-SALMETEROL 250-50 MCG/ACT IN AEPB: INHALATION_SPRAY | RESPIRATORY_TRACT | 3 refills | Status: DC

## 2020-11-25 NOTE — Progress Notes (Deleted)
Subjective:    Patient ID: [name], female    DOB: 10-28-1950, 70 y.o.   MRN: [id number]  HPI female never smoker followed for asthma complicated by history of lupus, aortic valve replacement/ pacemaker/ warfarin. Office spirometry 04/22/15- -------------------------------------------------------------------------------   09/09/19- 70 year old female never smoker followed for Asthma complicated by history of lupus, aortic valve replacement/ pacemaker/ warfarin, obesity, Prednisone 5 mg daily (for lupus), Wixela 250, albuterol hfa Had 2 Phizer Covax Asthma control vey good w/o signif exacerb. Usually 1 puff Wixela daily remains sufficient. Meds reviewed. Had sinus CT for what turned out to be dental abscess- ultimately had tooth pulled.  ECHO- 08/26/19- EF 40-45%, mild PAH, RVE, Grade 2DD CXR 09/08/2018- IMPRESSION: Stable cardiomegaly. No acute abnormality noted. CT max/fac 01/12/19- IMPRESSION: 1. Mild but possibly odontogenic mucosal thickening in the left maxillary alveolar recess, note left anterior molar periapical lucency. 2. Minimal to mild mucosal thickening elsewhere, including in the left posterior ethmoid and left sphenoid sinuses. 3. Rightward nasal septal deviation and spurring.  11/28/20- 70 year old female never smoker followed for Asthma complicated by history of lupus, aortic valve replacement/ pacemaker/ warfarin, obesity, Prednisone 5 mg daily (for lupus), Wixela 250, albuterol hfa Covid vax- Flu vax-    Review of Systems- see HPI    + = positive Constitutional:   No-   weight loss, night sweats, fevers, chills, fatigue, lassitude. HEENT:   No-  headaches, difficulty swallowing, tooth/dental problems, sore throat,       No-  sneezing, itching, +ear pressure, + controlled nasal congestion, post nasal drip,  CV:  No-   chest pain, orthopnea, PND, swelling in lower extremities, anasarca,dizziness, palpitations Resp: +shortness of breath with exertion or at  rest.              No-   productive cough, + non-productive cough,  No-  coughing up of blood.              No-   change in color of mucus.  No- wheezing.   Skin: No-   rash or lesions. GI:  No-   heartburn, indigestion, abdominal pain, nausea, vomiting,  GU:  MS:  No-   joint pain or swelling.   Neuro- nothing unusual  Psych:  No- change in mood or affect. No depression or anxiety.  No memory loss.  Objective:   Physical Exam General- Alert, Oriented, Affect-appropriate, Distress- none acute;  +obese Skin- rash-none, lesions- none, excoriation- none Lymphadenopathy- none Head- atraumatic            Eyes- Gross vision intact, PERRLA, conjunctivae clear secretions            Ears- Hearing, canals normal            Nose- Clear, no-Septal dev, mucus, polyps, erosion, perforation             Throat- Mallampati III , mucosa clear , drainage- none, tonsils- atrophic Neck- flexible , trachea midline, no stridor , thyroid nl, carotid no bruit Chest - symmetrical excursion , unlabored           Heart/CV- RRR , +crisp valve sounds,  1/6 Systolic AS  Murmur, no gallop  , no rub, nl s1 s2                           - JVD+1 , edema+1 in feet, stasis changes- none, varices- none           Lung- wheeze+  trace LUL, cough- none , dullness-none, rub- none, rales-none           Chest wall- L pacer Abd-  Br/ Gen/ Rectal- Not done, not indicated Extrem- cyanosis- none, clubbing, none, atrophy- none, strength- nl,        + superficial varices Neuro- grossly intact to observation  Assessment & Plan:

## 2020-11-28 ENCOUNTER — Ambulatory Visit: Payer: Medicare Other | Admitting: Internal Medicine

## 2020-12-08 ENCOUNTER — Telehealth: Payer: Self-pay

## 2020-12-08 ENCOUNTER — Ambulatory Visit (INDEPENDENT_AMBULATORY_CARE_PROVIDER_SITE_OTHER): Payer: Medicare Other | Admitting: Cardiology

## 2020-12-08 DIAGNOSIS — Z5181 Encounter for therapeutic drug level monitoring: Secondary | ICD-10-CM

## 2020-12-08 DIAGNOSIS — Z952 Presence of prosthetic heart valve: Secondary | ICD-10-CM

## 2020-12-08 LAB — POCT INR: INR: 2 (ref 2.0–3.0)

## 2020-12-08 NOTE — Telephone Encounter (Signed)
Lpm regarding INR.  She may self test today or have it done at [facility] 10/21 with her Dr Rayann Heman OV.  I told her to call and let me know.

## 2020-12-09 ENCOUNTER — Other Ambulatory Visit: Payer: Self-pay

## 2020-12-09 ENCOUNTER — Ambulatory Visit: Payer: Medicare Other | Admitting: Internal Medicine

## 2020-12-09 VITALS — BP 128/76 | HR 71 | Ht 65.0 in | Wt 245.8 lb

## 2020-12-09 DIAGNOSIS — Z952 Presence of prosthetic heart valve: Secondary | ICD-10-CM | POA: Diagnosis not present

## 2020-12-09 DIAGNOSIS — I442 Atrioventricular block, complete: Secondary | ICD-10-CM | POA: Diagnosis not present

## 2020-12-09 DIAGNOSIS — I10 Essential (primary) hypertension: Secondary | ICD-10-CM | POA: Diagnosis not present

## 2020-12-09 DIAGNOSIS — I5042 Chronic combined systolic (congestive) and diastolic (congestive) heart failure: Secondary | ICD-10-CM

## 2020-12-09 DIAGNOSIS — I428 Other cardiomyopathies: Secondary | ICD-10-CM

## 2020-12-09 DIAGNOSIS — E782 Mixed hyperlipidemia: Secondary | ICD-10-CM

## 2020-12-09 DIAGNOSIS — I4819 Other persistent atrial fibrillation: Secondary | ICD-10-CM

## 2020-12-09 MED ORDER — ENTRESTO 24-26 MG PO TABS: 1.0000 | ORAL_TABLET | Freq: Two times a day (BID) | ORAL | 3 refills | Status: DC

## 2020-12-09 NOTE — Patient Instructions (Addendum)
Medication Instructions:  Start Entresto 24-26 mg two times daily  Your physician recommends that you continue on your current medications as directed. Please refer to the Current Medication list given to you today. *If you need a refill on your cardiac medications before your next appointment, please call your pharmacy*  Lab Work: BMP, MAG If you have labs (blood work) drawn today and your tests are completely normal, you will receive your results only by: [place] (if you have MyChart) OR A paper copy in the mail If you have any lab test that is abnormal or we need to change your treatment, we will call you to review the results.  Testing/Procedures: None.  Follow-Up: At [facility], you and your health needs are our priority.  As part of our continuing mission to provide you with exceptional heart care, we have created designated Provider Care Teams.  These Care Teams include your primary Cardiologist (physician) and Advanced Practice Providers (APPs -  Physician Assistants and Nurse Practitioners) who all work together to provide you with the care you need, when you need it.  Your physician wants you to follow-up in: 6 months with   one of the following Advanced Practice Providers on your designated Care Team:     [name], [place]   You will receive a reminder letter in the mail two months in advance. If you don't receive a letter, please call our office to schedule the follow-up appointment.  Remote monitoring is used to monitor your Pacemaker from home. This monitoring reduces the number of office visits required to check your device to one time per year. It allows [place] to keep an eye on the functioning of your device to ensure it is working properly. You are scheduled for a device check from home on 01/11/21. You may send your transmission at any time that day. If you have a wireless device, the transmission will be sent automatically. After your physician reviews  your transmission, you will receive a postcard with your next transmission date.  We recommend signing up for the patient portal called "MyChart".  Sign up information is provided on this After Visit Summary.  MyChart is used to connect with patients for Virtual Visits (Telemedicine).  Patients are able to view lab/test results, encounter notes, upcoming appointments, etc.  Non-urgent messages can be sent to your provider as well.   To learn more about what you can do with MyChart, go to [email or web address].    Any Other Special Instructions Will Be Listed Below (If Applicable).

## 2020-12-09 NOTE — Progress Notes (Signed)
PCP: [name], MD Primary Cardiologist: Dr Gwenlyn Found Primary EP:  Dr Rayann Heman  [name] is a 70 y.o. female who presents today for routine electrophysiology followup.  Since last being seen in our clinic, the patient reports doing very well.  She did have substantial SOB in August which improved with diuresis.  She has done better since.  Today, she denies symptoms of palpitations, chest pain, shortness of breath,  lower extremity edema, dizziness, presyncope, or syncope.  The patient is otherwise without complaint today.   Past Medical History:  Diagnosis Date   Antiphospholipid antibody positive 06/28/2017   Asthma    CAD (coronary artery disease)    a. normal cors by cath in 03/2015   Carotid artery disease ([place])    Chronic anticoagulation    Complete heart block ([place]) 02/2013   a. Implantation of a dual chamber pacemaker in 02/2013 by Dr Rayann Heman. STJ model number 4650 pacemaker with model number 2088 right ventricular lead.    Dyslipidemia    H/O Doppler ultrasound 10/03/2010   carotid duplex - R ICA normal patency; L CCA-ICA bypass gradt patent common to interal cartoid bypass graft    H/O mechanical aortic valve replacement    History of echocardiogram 11/08/2011   EF >55%; mild-mod concentric LVH; trace aortic regurg.; LA mild-mod dilated   History of nuclear stress test 01/20/2010   normal pattern of perfusion; low risk scan   Hypertension    Lupus (systemic lupus erythematosus) (HCC)    PVD (peripheral vascular disease) (HCC)    60% R renal artery stenosis; non-functioning L kidney   Past Surgical History:  Procedure Laterality Date   AORTIC VALVE REPLACEMENT  11/29/006   [place]; St. Jude; on coumadin   CARDIAC CATHETERIZATION  11/01/2004   define anatomy    CARDIAC CATHETERIZATION N/A 04/19/2015   Procedure: Coronary Angiography;  Surgeon: Peter M [name], MD;  Location: [place] CV LAB;  Service: Cardiovascular;  Laterality: N/A;   CAROTID ENDARTERECTOMY      lt.   CHOLECYSTECTOMY  1999   EXPLORATORY LAPAROTOMY  04/12/2012   small bowel perf; ileostomy & R hemicolectomy    PACEMAKER INSERTION  03/06/2013   SJM Assurity DR PPM implanted by Dr Rayann Heman for complete heart block   PERMANENT PACEMAKER INSERTION N/A 03/06/2013   Procedure: PERMANENT PACEMAKER INSERTION;  Surgeon: Coralyn Mark, MD;  Location: [place] CATH LAB;  Service: Cardiovascular;  Laterality: N/A;   TEMPORARY PACEMAKER INSERTION N/A 03/06/2013   Procedure: TEMPORARY PACEMAKER INSERTION;  Surgeon: Coralyn Mark, MD;  Location: [place] CATH LAB;  Service: Cardiovascular;  Laterality: N/A;   TUBAL LIGATION  11/1982    ROS- all systems are reviewed and negative except as per HPI above  Current Outpatient Medications  Medication Sig Dispense Refill   acetaminophen (TYLENOL) 325 MG tablet Take 650 mg by mouth every 6 (six) hours as needed for moderate pain.      albuterol (PROVENTIL HFA;VENTOLIN HFA) 108 (90 Base) MCG/ACT inhaler Inhale 2 puffs into the lungs every 6 (six) hours as needed for wheezing or shortness of breath. 3 Inhaler 1   allopurinol (ZYLOPRIM) 100 MG tablet Take 100 mg by mouth daily.      calcium carbonate (OSCAL) 1500 (600 Ca) MG TABS tablet Take 1 tablet by mouth 2 (two) times daily.     cetirizine (ZYRTEC) 10 MG tablet Take 10 mg by mouth at bedtime.     chlorhexidine (PERIDEX) 0.12 % solution Use as directed  10 mLs in the mouth or throat 2 (two) times daily as needed (before dental appts).   5   cholecalciferol (VITAMIN D) 1000 UNITS tablet Take 2,000 Units by mouth 2 (two) times daily.      clotrimazole-betamethasone (LOTRISONE) cream as needed.     colestipol (COLESTID) 1 g tablet Take 1 g by mouth daily.     dicyclomine (BENTYL) 20 MG tablet Take by mouth.     dofetilide (TIKOSYN) 250 MCG capsule Take 1 capsule by mouth twice daily 180 capsule 1   Ferrous Fumarate-Folic Acid (HEMOCYTE-F) 324-1 MG TABS Take 1 tablet by mouth daily.     fluticasone-salmeterol (WIXELA  INHUB) 250-50 MCG/ACT AEPB USE 1 INHALATION BY MOUTH  twice daily 180 each 3   furosemide (LASIX) 20 MG tablet Take 1 tablet (20 mg total) by mouth daily. 180 tablet 3   hydroxychloroquine (PLAQUENIL) 200 MG tablet Take 200 mg by mouth daily.     isosorbide mononitrate (IMDUR) 30 MG 24 hr tablet TAKE 1 TABLET BY MOUTH  DAILY 90 tablet 3   leflunomide (ARAVA) 10 MG tablet Take 10 mg by mouth daily.     loratadine (CLARITIN) 10 MG tablet Take 10 mg by mouth daily.     Magnesium 200 MG TABS Take 1 tablet (200 mg total) by mouth daily. 30 each    metoprolol succinate (TOPROL-XL) 50 MG 24 hr tablet TAKE 1 TABLET BY MOUTH  DAILY WITH OR IMMEDIATELY  FOLLOWING A MEAL 90 tablet 3   potassium chloride SA (KLOR-CON) 20 MEQ tablet TAKE 1  BY MOUTH TWICE DAILY 60 tablet 11   predniSONE (DELTASONE) 5 MG tablet Take 5 mg by mouth daily.     Probiotic Product (PROBIOTIC DAILY PO) Take 1 tablet by mouth daily.     Saccharomyces boulardii (PROBIOTIC) 250 MG CAPS 1 capsule     simvastatin (ZOCOR) 20 MG tablet Take 1 tablet by mouth at  bedtime 90 tablet 0   warfarin (COUMADIN) 5 MG tablet TAKE 1 TO 1 AND 1/2 TABLETS BY MOUTH DAILY AS DIRECTED  BY COUMADIN CLINIC 135 tablet 0   No current facility-administered medications for this visit.    Physical Exam: Vitals:   12/09/20 1457  BP: 128/76  Pulse: 71  SpO2: 95%  Weight: 245 lb 12.8 oz (111.5 kg)  Height: 5\' 5"  (1.651 m)    GEN- The patient is well appearing, alert and oriented x 3 today.   Head- normocephalic, atraumatic Eyes-  Sclera clear, conjunctiva pink Ears- hearing intact Oropharynx- clear Lungs- Clear to ausculation bilaterally, normal work of breathing Chest- pacemaker pocket is well healed Heart- Regular rate and rhythm, no murmurs, rubs or gallops, PMI not laterally displaced GI- soft, NT, ND, + BS Extremities- no clubbing, cyanosis, or edema  Pacemaker interrogation- reviewed in detail today,  See PACEART report  ekg tracing  ordered today is personally reviewed and shows sinus with V pacing  Assessment and Plan:  1. Symptomatic complete heart block Normal pacemaker function See Pace Art report No changes today she is device dependant today Consider upgrade to CRT (see below).  We discussed this option at length today.  2. Persistent afib Doing well with tikosyn (AF burden is <1%) Labs 11/02/20 reviewed Bmet, mg ordered today  3. HTN Stable No change required today  4. S/p mechanical AVR On coumadin  5. Chronic systolic dysfunction/ nonischemic CM She has a pending echo Clinically stable Consider upgrade to CRT if symptoms worsen or EF declined  further after GDMT I will add entresto today.  Pharmacy to assist with dosing.  We will need to watch K as she is on supplement and has had previously elevated K with spironolactone  6. Moribid obesity Body mass index is 40.9 kg/m. Lifestyle modification advised  Risks, benefits and potential toxicities for medications prescribed and/or refilled reviewed with patient today.   Return to see EP APP in 6 months  Thompson Grayer MD, [facility] 12/09/2020 3:08 PM

## 2020-12-10 LAB — BASIC METABOLIC PANEL
BUN/Creatinine Ratio: 17 (ref 12–28)
BUN: 27 mg/dL (ref 8–27)
CO2: 26 mmol/L (ref 20–29)
Calcium: 9.5 mg/dL (ref 8.7–10.3)
Chloride: 104 mmol/L (ref 96–106)
Creatinine, Ser: 1.56 mg/dL — ABNORMAL HIGH (ref 0.57–1.00)
Glucose: 119 mg/dL — ABNORMAL HIGH (ref 70–99)
Potassium: 4.4 mmol/L (ref 3.5–5.2)
Sodium: 142 mmol/L (ref 134–144)
eGFR: 36 mL/min/{1.73_m2} — ABNORMAL LOW (ref >59)

## 2020-12-10 LAB — MAGNESIUM: Magnesium: 2.1 mg/dL (ref 1.6–2.3)

## 2020-12-20 ENCOUNTER — Other Ambulatory Visit: Payer: Self-pay

## 2020-12-20 ENCOUNTER — Ambulatory Visit (HOSPITAL_COMMUNITY): Payer: Medicare Other | Attending: Cardiology

## 2020-12-20 DIAGNOSIS — Z952 Presence of prosthetic heart valve: Secondary | ICD-10-CM | POA: Insufficient documentation

## 2020-12-20 LAB — ECHOCARDIOGRAM COMPLETE
AV Mean grad: 18 mmHg
AV Peak grad: 32.4 mmHg
Ao pk vel: 2.85 m/s
Area-P 1/2: 4.8 cm2
MV M vel: 5.62 m/s
MV Peak grad: 126.1 mmHg
P 1/2 time: 474 msec
Radius: 0.6 cm
S' Lateral: 4.7 cm

## 2020-12-22 ENCOUNTER — Telehealth: Payer: Self-pay

## 2020-12-22 ENCOUNTER — Ambulatory Visit: Payer: Medicare Other

## 2020-12-22 ENCOUNTER — Ambulatory Visit (INDEPENDENT_AMBULATORY_CARE_PROVIDER_SITE_OTHER): Payer: Medicare Other | Admitting: Cardiovascular Disease

## 2020-12-22 DIAGNOSIS — Z5181 Encounter for therapeutic drug level monitoring: Secondary | ICD-10-CM

## 2020-12-22 DIAGNOSIS — Z952 Presence of prosthetic heart valve: Secondary | ICD-10-CM | POA: Diagnosis not present

## 2020-12-22 LAB — POCT INR: INR: 3.4 — AB (ref 2.0–3.0)

## 2020-12-22 NOTE — Telephone Encounter (Signed)
Lpm to check INR 

## 2020-12-30 ENCOUNTER — Other Ambulatory Visit: Payer: Self-pay

## 2020-12-30 ENCOUNTER — Ambulatory Visit: Payer: Medicare Other | Admitting: Pharmacist

## 2020-12-30 VITALS — BP 136/72 | HR 73 | Wt 248.0 lb

## 2020-12-30 DIAGNOSIS — I5043 Acute on chronic combined systolic (congestive) and diastolic (congestive) heart failure: Secondary | ICD-10-CM

## 2020-12-30 NOTE — Progress Notes (Signed)
Patient ID: [name]                 DOB: 04-30-1950                      MRN: [id number]     HPI: [name] is a 70 y.o. female referred by Dr. Rayann Heman to pharmacy clinic for HF medication management. PMH is significant for A Fib with mechanical AV on warafrin, HTN, CHF and renal insufficiency. Most recent LVEF 30-35% on 12/20/20.  Today she returns to pharmacy clinic for further medication titration. At last visit with Dr Rayann Heman on 10/28, Delene Loll 24-26 was started. However she had issues picking up medication at the pharmacy and did not start medication until 10/28.   Symptomatically, she is not feeling any different since starting Entresto,  Denies dizziness, lightheadedness, and fatigue. Denies chest pain or palpitations. Feels SOB when walking up hills/stairs/taking out the trash. Able to complete all ADLs. She has not been checking her blood pressure at home because she thinks her cuff is not accurate.  She has lower extremity edema in both legs and currently is on daily furosemde.  She has been weighing herself at home and reports it is stable.  Has been on spironolactone in 2018 but was d/c due to hyperkalemia  Current CHF meds:   Entresto 24/26 BID Furosemide 20mg  daily  BP goal: <130/80  Wt Readings from Last 3 Encounters:  12/09/20 245 lb 12.8 oz (111.5 kg)  10/19/20 243 lb 3.2 oz (110.3 kg)  09/07/20 262 lb 6.4 oz (119 kg)   BP Readings from Last 3 Encounters:  12/09/20 128/76  10/19/20 135/83  09/07/20 (!) 145/79   Pulse Readings from Last 3 Encounters:  12/09/20 71  10/19/20 71  09/07/20 73    Renal function: CrCl cannot be calculated (Patient's most recent lab result is older than the maximum 21 days allowed.).  Past Medical History:  Diagnosis Date   Antiphospholipid antibody positive 06/28/2017   Asthma    CAD (coronary artery disease)    a. normal cors by cath in 03/2015   Carotid artery disease ([place])    Chronic anticoagulation    Complete heart  block ([place]) 02/2013   a. Implantation of a dual chamber pacemaker in 02/2013 by Dr Rayann Heman. STJ model number 1607 pacemaker with model number [id number] right ventricular lead.    Dyslipidemia    H/O Doppler ultrasound 10/03/2010   carotid duplex - R ICA normal patency; L CCA-ICA bypass gradt patent common to interal cartoid bypass graft    H/O mechanical aortic valve replacement    History of echocardiogram 11/08/2011   EF >55%; mild-mod concentric LVH; trace aortic regurg.; LA mild-mod dilated   History of nuclear stress test 01/20/2010   normal pattern of perfusion; low risk scan   Hypertension    Lupus (systemic lupus erythematosus) (HCC)    PVD (peripheral vascular disease) (HCC)    60% R renal artery stenosis; non-functioning L kidney    Current Outpatient Medications on File Prior to Visit  Medication Sig Dispense Refill   acetaminophen (TYLENOL) 325 MG tablet Take 650 mg by mouth every 6 (six) hours as needed for moderate pain.      albuterol (PROVENTIL HFA;VENTOLIN HFA) 108 (90 Base) MCG/ACT inhaler Inhale 2 puffs into the lungs every 6 (six) hours as needed for wheezing or shortness of breath. 3 Inhaler 1   allopurinol (ZYLOPRIM) 100 MG tablet Take 100 mg by  mouth daily.      calcium carbonate (OSCAL) 1500 (600 Ca) MG TABS tablet Take 1 tablet by mouth 2 (two) times daily.     cetirizine (ZYRTEC) 10 MG tablet Take 10 mg by mouth at bedtime.     chlorhexidine (PERIDEX) 0.12 % solution Use as directed 10 mLs in the mouth or throat 2 (two) times daily as needed (before dental appts).   5   cholecalciferol (VITAMIN D) 1000 UNITS tablet Take 2,000 Units by mouth 2 (two) times daily.      clotrimazole-betamethasone (LOTRISONE) cream as needed.     colestipol (COLESTID) 1 g tablet Take 1 g by mouth daily.     dicyclomine (BENTYL) 20 MG tablet Take by mouth.     dofetilide (TIKOSYN) 250 MCG capsule Take 1 capsule by mouth twice daily 180 capsule 1   Ferrous Fumarate-Folic Acid (HEMOCYTE-F) 324-1  MG TABS Take 1 tablet by mouth daily.     fluticasone-salmeterol (WIXELA INHUB) 250-50 MCG/ACT AEPB USE 1 INHALATION BY MOUTH  twice daily 180 each 3   furosemide (LASIX) 20 MG tablet Take 1 tablet (20 mg total) by mouth daily. 180 tablet 3   hydroxychloroquine (PLAQUENIL) 200 MG tablet Take 200 mg by mouth daily.     isosorbide mononitrate (IMDUR) 30 MG 24 hr tablet TAKE 1 TABLET BY MOUTH  DAILY 90 tablet 3   leflunomide (ARAVA) 10 MG tablet Take 10 mg by mouth daily.     loratadine (CLARITIN) 10 MG tablet Take 10 mg by mouth daily.     Magnesium 200 MG TABS Take 1 tablet (200 mg total) by mouth daily. 30 each    metoprolol succinate (TOPROL-XL) 50 MG 24 hr tablet TAKE 1 TABLET BY MOUTH  DAILY WITH OR IMMEDIATELY  FOLLOWING A MEAL 90 tablet 3   potassium chloride SA (KLOR-CON) 20 MEQ tablet TAKE 1  BY MOUTH TWICE DAILY 60 tablet 11   predniSONE (DELTASONE) 5 MG tablet Take 5 mg by mouth daily.     Probiotic Product (PROBIOTIC DAILY PO) Take 1 tablet by mouth daily.     Saccharomyces boulardii (PROBIOTIC) 250 MG CAPS 1 capsule     sacubitril-valsartan (ENTRESTO) 24-26 MG Take 1 tablet by mouth 2 (two) times daily. 180 tablet 3   simvastatin (ZOCOR) 20 MG tablet Take 1 tablet by mouth at  bedtime 90 tablet 0   warfarin (COUMADIN) 5 MG tablet TAKE 1 TO 1 AND 1/2 TABLETS BY MOUTH DAILY AS DIRECTED  BY COUMADIN CLINIC 135 tablet 0   No current facility-administered medications on file prior to visit.    Allergies  Allergen Reactions   Spironolactone Other (See Comments)    Hyperkalemia     Assessment/Plan:  1. CHF -  Patient BP today 136/72 which is above goal of <130/80.  Patient is symtomatic when exerting herself and has LEE despite addition of Entresto and continuing on furosemide.  Would ideally increase Entresto to next dose however will need to closely monitor renal function and K.  Will order BMP today.  Recommended patient continue to avoid salt and continue to weigh herself  daily.  Recommended she begin checking her BP at home and to bring in cuff at next visit and it can be verified.  Continue Entresto 24-26 BID Continue furosemide 20mg  daily Check BMP  Karren Cobble, PharmD, BCACP, CDCES, [place] [address] [place], [place] [address] Phone: [phone]; Fax: [phone] 01/02/2021 9:01 AM

## 2020-12-30 NOTE — Patient Instructions (Addendum)
It was good seeing you today  We would like your blood pressure to be less than 130/80  We will check your lab work today and if everything looks stable, we can discuss increasing your Entresto to 2 tablets twice a day  Continue your furosemide once daily  Continue to watch your salt intake   I recommend starting to check your blood pressure at home and continue to weight yourself every morning.  Let us know if you gain more than 3# overnight or 5# in a week  We will call you next week with your results  Karren Cobble, PharmD, BCACP, [place], [place], [place], [place], [address] Phone: [phone], Fax: [phone]

## 2020-12-31 LAB — BASIC METABOLIC PANEL
BUN/Creatinine Ratio: 18 (ref 12–28)
BUN: 25 mg/dL (ref 8–27)
CO2: 20 mmol/L (ref 20–29)
Calcium: 9.4 mg/dL (ref 8.7–10.3)
Chloride: 109 mmol/L — ABNORMAL HIGH (ref 96–106)
Creatinine, Ser: 1.39 mg/dL — ABNORMAL HIGH (ref 0.57–1.00)
Glucose: 96 mg/dL (ref 70–99)
Potassium: 5.5 mmol/L — ABNORMAL HIGH (ref 3.5–5.2)
Sodium: 144 mmol/L (ref 134–144)
eGFR: 41 mL/min/{1.73_m2} — ABNORMAL LOW (ref >59)

## 2021-01-03 ENCOUNTER — Telehealth: Payer: Self-pay | Admitting: Pharmacist

## 2021-01-03 DIAGNOSIS — I509 Heart failure, unspecified: Secondary | ICD-10-CM

## 2021-01-03 NOTE — Addendum Note (Signed)
Addended by: Rollen Sox on: 01/03/2021 05:22 PM   Modules accepted: Orders

## 2021-01-03 NOTE — Telephone Encounter (Signed)
Patient called back regarding lab results.  Due to elevated potassium, advised to d/c potassium supplements and recheck BMP in 1 week.  Patient voiced understanding.

## 2021-01-04 ENCOUNTER — Other Ambulatory Visit: Payer: Self-pay

## 2021-01-06 ENCOUNTER — Ambulatory Visit (INDEPENDENT_AMBULATORY_CARE_PROVIDER_SITE_OTHER): Payer: Medicare Other | Admitting: Cardiovascular Disease

## 2021-01-06 DIAGNOSIS — Z5181 Encounter for therapeutic drug level monitoring: Secondary | ICD-10-CM | POA: Diagnosis not present

## 2021-01-06 DIAGNOSIS — Z952 Presence of prosthetic heart valve: Secondary | ICD-10-CM | POA: Diagnosis not present

## 2021-01-06 LAB — POCT INR: INR: 2.2 (ref 2.0–3.0)

## 2021-01-10 ENCOUNTER — Telehealth: Payer: Self-pay | Admitting: Pharmacist

## 2021-01-10 DIAGNOSIS — I5043 Acute on chronic combined systolic (congestive) and diastolic (congestive) heart failure: Secondary | ICD-10-CM

## 2021-01-10 LAB — BASIC METABOLIC PANEL
BUN/Creatinine Ratio: 21 (ref 12–28)
BUN: 27 mg/dL (ref 8–27)
CO2: 22 mmol/L (ref 20–29)
Calcium: 9.3 mg/dL (ref 8.7–10.3)
Chloride: 106 mmol/L (ref 96–106)
Creatinine, Ser: 1.29 mg/dL — ABNORMAL HIGH (ref 0.57–1.00)
Glucose: 94 mg/dL (ref 70–99)
Potassium: 5.4 mmol/L — ABNORMAL HIGH (ref 3.5–5.2)
Sodium: 142 mmol/L (ref 134–144)
eGFR: 45 mL/min/{1.73_m2} — ABNORMAL LOW (ref >59)

## 2021-01-10 MED ORDER — LOKELMA 10 G PO PACK: 10.0000 g | PACK | Freq: Two times a day (BID) | ORAL | 0 refills | Status: DC

## 2021-01-10 NOTE — Telephone Encounter (Signed)
Spoke with patient.  Despite discontinuing K supplements, potassium remains elevated.  Patient has a history of hyperkalemia. Will give 2 days of Lokelma and recheck BMP on 11/28.  May not be a candidate for Entresto.

## 2021-01-11 ENCOUNTER — Ambulatory Visit (INDEPENDENT_AMBULATORY_CARE_PROVIDER_SITE_OTHER): Payer: Medicare Other

## 2021-01-11 DIAGNOSIS — I428 Other cardiomyopathies: Secondary | ICD-10-CM

## 2021-01-11 LAB — CUP PACEART REMOTE DEVICE CHECK
Battery Remaining Longevity: 28 mo
Battery Remaining Percentage: 25 %
Battery Voltage: 2.9 V
Brady Statistic AP VP Percent: 42 %
Brady Statistic AP VS Percent: 1 %
Brady Statistic AS VP Percent: 58 %
Brady Statistic AS VS Percent: 1 %
Brady Statistic RA Percent Paced: 39 %
Brady Statistic RV Percent Paced: 99 %
Date Time Interrogation Session: 20221123033232
Implantable Lead Implant Date: 20150116
Implantable Lead Implant Date: 20150116
Implantable Lead Location: 753859
Implantable Lead Location: 753860
Implantable Lead Model: 5076
Implantable Pulse Generator Implant Date: 20150116
Lead Channel Impedance Value: 380 Ohm
Lead Channel Impedance Value: 550 Ohm
Lead Channel Pacing Threshold Amplitude: 0.5 V
Lead Channel Pacing Threshold Amplitude: 0.75 V
Lead Channel Pacing Threshold Pulse Width: 0.4 ms
Lead Channel Pacing Threshold Pulse Width: 0.4 ms
Lead Channel Sensing Intrinsic Amplitude: 12 mV
Lead Channel Sensing Intrinsic Amplitude: 4.6 mV
Lead Channel Setting Pacing Amplitude: 2 V
Lead Channel Setting Pacing Amplitude: 2.5 V
Lead Channel Setting Pacing Pulse Width: 0.4 ms
Lead Channel Setting Sensing Sensitivity: 2 mV
Pulse Gen Model: 2240
Pulse Gen Serial Number: 7587004

## 2021-01-17 LAB — BASIC METABOLIC PANEL
BUN/Creatinine Ratio: 19 (ref 12–28)
BUN: 27 mg/dL (ref 8–27)
CO2: 24 mmol/L (ref 20–29)
Calcium: 9.4 mg/dL (ref 8.7–10.3)
Chloride: 102 mmol/L (ref 96–106)
Creatinine, Ser: 1.4 mg/dL — ABNORMAL HIGH (ref 0.57–1.00)
Glucose: 96 mg/dL (ref 70–99)
Potassium: 4.4 mmol/L (ref 3.5–5.2)
Sodium: 142 mmol/L (ref 134–144)
eGFR: 41 mL/min/{1.73_m2} — ABNORMAL LOW (ref >59)

## 2021-01-23 NOTE — Progress Notes (Signed)
Cardiology Clinic Note   Patient Name: [name] Date of Encounter: 01/24/2021  Primary Care Provider:  [name], MD Primary Cardiologist:  Quay Burow, MD  Patient Profile    [name] 70 year old female presents to the clinic today for follow-up evaluation of her acute on chronic combined systolic and diastolic CHF.  Past Medical History    Past Medical History:  Diagnosis Date   Antiphospholipid antibody positive 06/28/2017   Asthma    CAD (coronary artery disease)    a. normal cors by cath in 03/2015   Carotid artery disease ([place])    Chronic anticoagulation    Complete heart block ([place]) 02/2013   a. Implantation of a dual chamber pacemaker in 02/2013 by Dr Rayann Heman. STJ model number 4854 pacemaker with model number [id number] right ventricular lead.    Dyslipidemia    H/O Doppler ultrasound 10/03/2010   carotid duplex - R ICA normal patency; L CCA-ICA bypass gradt patent common to interal cartoid bypass graft    H/O mechanical aortic valve replacement    History of echocardiogram 11/08/2011   EF >55%; mild-mod concentric LVH; trace aortic regurg.; LA mild-mod dilated   History of nuclear stress test 01/20/2010   normal pattern of perfusion; low risk scan   Hypertension    Lupus (systemic lupus erythematosus) (HCC)    PVD (peripheral vascular disease) (HCC)    60% R renal artery stenosis; non-functioning L kidney   Past Surgical History:  Procedure Laterality Date   AORTIC VALVE REPLACEMENT  11/29/006   [facility]; [facility] [facility]; on coumadin   CARDIAC CATHETERIZATION  11/01/2004   define anatomy    CARDIAC CATHETERIZATION N/A 04/19/2015   Procedure: Coronary Angiography;  Surgeon: Peter M [name], MD;  Location: [facility];  Service: Cardiovascular;  Laterality: N/A;   CAROTID ENDARTERECTOMY     lt.   CHOLECYSTECTOMY  1999   EXPLORATORY LAPAROTOMY  04/12/2012   small bowel perf; ileostomy & R hemicolectomy    PACEMAKER INSERTION  03/06/2013   SJM Assurity DR  PPM implanted by Dr Rayann Heman for complete heart block   PERMANENT PACEMAKER INSERTION N/A 03/06/2013   Procedure: PERMANENT PACEMAKER INSERTION;  Surgeon: Coralyn Mark, MD;  Location: [facility];  Service: Cardiovascular;  Laterality: N/A;   TEMPORARY PACEMAKER INSERTION N/A 03/06/2013   Procedure: TEMPORARY PACEMAKER INSERTION;  Surgeon: Coralyn Mark, MD;  Location: [facility];  Service: Cardiovascular;  Laterality: N/A;   TUBAL LIGATION  11/1982    Allergies  Allergies  Allergen Reactions   Spironolactone Other (See Comments)    Hyperkalemia    History of Present Illness    [name] has a PMH of atrial fibrillation with mechanical AVR on warfarin, hypertension, chronic combined systolic and diastolic CHF, and renal insufficiency.  Her echocardiogram 12/20/2020 showed an LVEF of 30-35%.  She was referred to pharmacy by Dr. Rayann Heman for management of her heart failure medication.  She was initiated on Entresto 24-26 and repeat labs showed hyperkalemia.  Her lab work from 01/09/2021 showed a potassium of 5.4.  She was given 2 days of Lokelma and her follow-up lab work on 01/16/2021 showed a potassium of 4.4.  During her follow-up visit with pharmacy 12/30/2020 she shortness of breath with increased physical activity.  She denied chest pain and palpitations.  She was able to complete all of her ADLs.  She had not been checking her blood pressure at home because she felt her blood pressure cuff  was not accurate.  She was noted to have bilateral lower extremity swelling was compliant with her furosemide.  She had been weighing herself daily and reported that it was stable.  Of note she was also unable to tolerate spironolactone in 2018 due to hyperkalemia.  She presents the clinic today for follow-up evaluation states she feels well.  She is also seeing nephrology for her increased kidney function.  We reviewed her prior medications and her increased potassium.  She did not tolerate Entresto.   We will try her on losartan 25 mg daily.  I will also give her the Tangent support stocking sheet and a salty 6 diet sheet.  We will plan follow-up for 1 month with me or pharmacy for up titration of her losartan.  Today she denies chest pain, shortness of breath, lower extremity edema, fatigue, palpitations, melena, hematuria, hemoptysis, diaphoresis, weakness, presyncope, syncope, orthopnea, and PND.   Home Medications    Prior to Admission medications   Medication Sig Start Date End Date Taking? Authorizing Provider  acetaminophen (TYLENOL) 325 MG tablet Take 650 mg by mouth every 6 (six) hours as needed for moderate pain.     [provider]  albuterol (PROVENTIL HFA;VENTOLIN HFA) 108 (90 Base) MCG/ACT inhaler Inhale 2 puffs into the lungs every 6 (six) hours as needed for wheezing or shortness of breath. 06/10/17   Baird Lyons D, MD  allopurinol (ZYLOPRIM) 100 MG tablet Take 100 mg by mouth daily.     [provider]  calcium carbonate (OSCAL) 1500 (600 Ca) MG TABS tablet Take 1 tablet by mouth 2 (two) times daily.    [provider]  cetirizine (ZYRTEC) 10 MG tablet Take 10 mg by mouth at bedtime.    [provider]  chlorhexidine (PERIDEX) 0.12 % solution Use as directed 10 mLs in the mouth or throat 2 (two) times daily as needed (before dental appts).  03/31/15   [provider]  cholecalciferol (VITAMIN D) 1000 UNITS tablet Take 2,000 Units by mouth 2 (two) times daily.     [provider]  clotrimazole-betamethasone (LOTRISONE) cream as needed. 11/04/18   [provider]  colestipol (COLESTID) 1 g tablet Take 1 g by mouth daily.    [provider]  dicyclomine (BENTYL) 20 MG tablet Take by mouth. 07/15/19   [provider]  dofetilide (TIKOSYN) 250 MCG capsule Take 1 capsule by mouth twice daily 07/22/20   Fenton, Clint R, PA  Ferrous Fumarate-Folic Acid (HEMOCYTE-F) 324-1 MG TABS Take 1 tablet by mouth  daily. 09/20/20   [provider]  fluticasone-salmeterol Grant Ruts INHUB) 250-50 MCG/ACT AEPB USE 1 INHALATION BY MOUTH  twice daily 11/25/20   Baird Lyons D, MD  furosemide (LASIX) 20 MG tablet Take 1 tablet (20 mg total) by mouth daily. 10/19/20   [name], MD  hydroxychloroquine (PLAQUENIL) 200 MG tablet Take 200 mg by mouth daily.    [provider]  isosorbide mononitrate (IMDUR) 30 MG 24 hr tablet TAKE 1 TABLET BY MOUTH  DAILY 11/22/20   [name], MD  leflunomide (ARAVA) 10 MG tablet Take 10 mg by mouth daily.    [provider]  loratadine (CLARITIN) 10 MG tablet Take 10 mg by mouth daily.    [provider]  Magnesium 200 MG TABS Take 1 tablet (200 mg total) by mouth daily. 04/15/18   Sherran Needs, NP  metoprolol succinate (TOPROL-XL) 50 MG 24 hr tablet TAKE 1 TABLET BY MOUTH  DAILY WITH OR IMMEDIATELY  FOLLOWING A MEAL 11/22/20   [name], MD  potassium chloride SA (KLOR-CON) 20 MEQ tablet TAKE 1  BY MOUTH TWICE DAILY 07/06/20   Sherran Needs, NP  predniSONE (DELTASONE) 5 MG tablet Take 5 mg by mouth daily.    [provider]  Probiotic Product (PROBIOTIC DAILY PO) Take 1 tablet by mouth daily.    [provider]  Saccharomyces boulardii (PROBIOTIC) 250 MG CAPS 1 capsule    [provider]  simvastatin (ZOCOR) 20 MG tablet Take 1 tablet by mouth at  bedtime 06/07/15   [name], MD  sodium zirconium cyclosilicate (LOKELMA) 10 g PACK packet Take 10 g by mouth 2 (two) times daily. 01/10/21   Allred, Jeneen Rinks, MD  warfarin (COUMADIN) 5 MG tablet TAKE 1 TO 1 AND 1/2 TABLETS BY MOUTH DAILY AS DIRECTED  BY COUMADIN CLINIC 08/10/20   [name], MD    Family History    Family History  Problem Relation Age of Onset   Alzheimer's disease Father    Cirrhosis Father    Cancer Father        prostate   Heart attack Neg Hx    She indicated that her mother is alive. She indicated that her father  is deceased. She indicated that her maternal grandmother is deceased. She indicated that her maternal grandfather is deceased. She indicated that her paternal grandmother is deceased. She indicated that her paternal grandfather is deceased. She indicated that the status of her neg hx is unknown.  Social History    Social History   Socioeconomic History   Marital status: Divorced    Spouse name: Not on file   Number of children: 3   Years of education: Not on file   Highest education level: Not on file  Occupational History   Occupation: Retired  Tobacco Use   Smoking status: Never   Smokeless tobacco: Never  Vaping Use   Vaping Use: Never used  Substance and Sexual Activity   Alcohol use: No   Drug use: No   Sexual activity: Not on file  Other Topics Concern   Not on file  Social History Narrative   Lives alone in [place].   Social Determinants of Health   Financial Resource Strain: Not on file  Food Insecurity: Not on file  Transportation Needs: Not on file  Physical Activity: Not on file  Stress: Not on file  Social Connections: Not on file  Intimate Partner Violence: Not on file     Review of Systems    General:  No chills, fever, night sweats or weight changes.  Cardiovascular:  No chest pain, dyspnea on exertion, edema, orthopnea, palpitations, paroxysmal nocturnal dyspnea. Dermatological: No rash, lesions/masses Respiratory: No cough, dyspnea Urologic: No hematuria, dysuria Abdominal:   No nausea, vomiting, diarrhea, bright red blood per rectum, melena, or hematemesis Neurologic:  No visual changes, wkns, changes in mental status. All other systems reviewed and are otherwise negative except as noted above.  Physical Exam    VS:  BP 130/82   Pulse 77   Ht 5\' 6"  (1.676 m)   Wt 242 lb 9.6 oz (110 kg)   SpO2 98%   BMI 39.16 kg/m  , BMI Body mass index is 39.16 kg/m. GEN: Well nourished, well developed, in no acute distress. HEENT: normal. Neck:  Supple, no JVD, carotid bruits, or masses. Cardiac: RRR, no murmurs, rubs, or gallops. No clubbing, cyanosis, edema.  Radials/DP/PT  2+ and equal bilaterally.  Respiratory:  Respirations regular and unlabored, clear to auscultation bilaterally. GI: Soft, nontender, nondistended, BS + x 4. MS: no deformity or atrophy. Skin: warm and dry, no rash. Neuro:  Strength and sensation are intact. Psych: Normal affect.  Accessory Clinical Findings    Recent Labs: 06/08/2020: Hemoglobin 11.6; Platelets 180 09/19/2020: BNP 809.4 12/09/2020: Magnesium 2.1 01/16/2021: BUN 27; Creatinine, Ser 1.40; Potassium 4.4; Sodium 142   Recent Lipid Panel    Component Value Date/Time   CHOL 174 12/02/2019 1125   TRIG 124 12/02/2019 1125   HDL 99 12/02/2019 1125   CHOLHDL 1.8 12/02/2019 1125   LDLCALC 54 12/02/2019 1125    ECG personally reviewed by me today-none today.  Echocardiogram 12/20/2020  IMPRESSIONS     1. Left ventricular ejection fraction, by estimation, is 30 to 35%. The  left ventricle has moderately decreased function. The left ventricle  demonstrates regional wall motion abnormalities with septal and apical  hypokinesis and septal-lateral  dyssynchrony. The left ventricular internal cavity size was mildly  dilated. There is mild left ventricular hypertrophy. Left ventricular  diastolic parameters are consistent with Grade II diastolic dysfunction  (pseudonormalization).   2. Right ventricular systolic function is mildly reduced. The right  ventricular size is moderately enlarged. There is moderately elevated  pulmonary artery systolic pressure. The estimated right ventricular  systolic pressure is 16.1 mmHg.   3. Left atrial size was severely dilated.   4. Right atrial size was severely dilated.   5. The mitral valve is abnormal. Moderate to severe mitral valve  regurgitation with restriction of the posterior mitral leaflet. No  evidence of mitral stenosis.   6. The tricuspid valve  is abnormal. Tricuspid valve regurgitation is  severe (torrential), ? impaired valve movement due to pacemaker. Systolic  flow reversal in the hepatic vein PW doppler pattern.   7. [employer] mechanical aortic valve. Mean gradient 18 mmHg with  trivial aortic insufficiency.   8. Aortic dilatation noted. There is mild dilatation of the ascending  aorta, measuring 40 mm.   9. The inferior vena cava is dilated in size with <50% respiratory  variability, suggesting right atrial pressure of 15 mmHg.   Comparison(s): 12/31/19 EF 40-45%. PA pressure 22mmHg. AV 65mmHg mean PG,  32mmHg peak PG.  Assessment & Plan   1.  Combined systolic and diastolic CHF-continues with +1-2 bilateral lower extremity nonpitting edema to mid shin.  Weight today 242.6.  Did not tolerate Entresto due to hyperkalemia.  Also previously did not tolerate spironolactone due to hyperkalemia. Continue metoprolol, furosemide Start losartan 25 mg daily Heart healthy low-sodium diet-salty 6 given Increase physical activity as tolerated Repeat BMP in one week. Plan for repeat echocardiogram once medications have been optimized.  Aortic valve stenosis-status post aortic valve replacement  AVR 2006.  Echocardiogram 12/20/2020 showed trivial aortic valve regurgitation.  Coronary artery disease-denies recent episodes of arm neck back or chest discomfort. Continue metoprolol, simvastatin Heart healthy low-sodium diet-salty 6 given Increase physical activity as tolerated  Carotid artery disease-carotid ultrasound 07/26/2020.  RightI CA 1-39% anf known chronically occluded left ICA GSV bypass graft.  No changes with recent Doppler study. Repeat carotid Dopplers 6/23  Hyperlipidemia-LDL 54 on 12/02/19 Continue simvastatin Heart healthy low-sodium diet-salty 6 given Increase physical activity as tolerated  Disposition: Follow-up with Dr. Gwenlyn Found or me in 1 to 2 months.  Jossie Ng. Errol Ala NP-C    01/24/2021, 2:24 PM [facility] [address] Office (  [phone] Fax [phone]  Notice: This dictation was prepared with Dragon dictation along with smaller phrase technology. Any transcriptional errors that result from this process are unintentional and may not be corrected upon review.  I spent 13 minutes examining this patient, reviewing medications, and using patient centered shared decision making involving her cardiac care.  Prior to her visit I spent greater than 20 minutes reviewing her past medical history,  medications, and prior cardiac tests.

## 2021-01-23 NOTE — Progress Notes (Signed)
Remote pacemaker transmission.   

## 2021-01-24 ENCOUNTER — Encounter: Payer: Self-pay | Admitting: General Practice

## 2021-01-24 ENCOUNTER — Other Ambulatory Visit: Payer: Self-pay

## 2021-01-24 ENCOUNTER — Ambulatory Visit (INDEPENDENT_AMBULATORY_CARE_PROVIDER_SITE_OTHER): Payer: Medicare Other | Admitting: Pharmacist Clinician (PhC)/ Clinical Pharmacy Specialist

## 2021-01-24 ENCOUNTER — Ambulatory Visit: Payer: Medicare Other | Admitting: General Practice

## 2021-01-24 ENCOUNTER — Ambulatory Visit: Payer: Medicare Other | Admitting: Pharmacist Clinician (PhC)/ Clinical Pharmacy Specialist

## 2021-01-24 VITALS — BP 130/82 | HR 77 | Ht 66.0 in | Wt 242.6 lb

## 2021-01-24 DIAGNOSIS — Z5181 Encounter for therapeutic drug level monitoring: Secondary | ICD-10-CM

## 2021-01-24 DIAGNOSIS — I5042 Chronic combined systolic (congestive) and diastolic (congestive) heart failure: Secondary | ICD-10-CM

## 2021-01-24 DIAGNOSIS — I6523 Occlusion and stenosis of bilateral carotid arteries: Secondary | ICD-10-CM | POA: Diagnosis not present

## 2021-01-24 DIAGNOSIS — Z952 Presence of prosthetic heart valve: Secondary | ICD-10-CM | POA: Diagnosis not present

## 2021-01-24 DIAGNOSIS — E782 Mixed hyperlipidemia: Secondary | ICD-10-CM

## 2021-01-24 DIAGNOSIS — I2510 Atherosclerotic heart disease of native coronary artery without angina pectoris: Secondary | ICD-10-CM

## 2021-01-24 DIAGNOSIS — Z79899 Other long term (current) drug therapy: Secondary | ICD-10-CM

## 2021-01-24 LAB — POCT INR: INR: 2.6 (ref 2.0–3.0)

## 2021-01-24 MED ORDER — LOSARTAN POTASSIUM 25 MG PO TABS: 25.0000 mg | ORAL_TABLET | Freq: Every day | ORAL | 3 refills | Status: DC

## 2021-01-24 NOTE — Addendum Note (Signed)
Addended by: Waylan Rocher on: 01/24/2021 02:47 PM   Modules accepted: Orders

## 2021-01-24 NOTE — Patient Instructions (Signed)
Medication Instructions:  START LOSARTAN 25MG  DAILY *If you need a refill on your cardiac medications before your next appointment, please call your pharmacy*  Lab Work: BMET IN 1 WEEK If you have labs (blood work) drawn today and your tests are completely normal, you will receive your results only by:  [facility] (if you have MyChart) OR A paper copy in the mail.  If you have any lab test that is abnormal or we need to change your treatment, we will call you to review the results. You may go to any [facility] that is convenient for you however, we do have a lab in our office that is able to assist you. You DO NOT need an appointment for our lab. The lab is open 8:00am and closes at 4:00pm. Lunch 12:45 - 1:45pm.  Special Instructions  PLEASE READ AND FOLLOW SALTY 6-ATTACHED-1,800 mg daily  PLEASE PURCHASE AND WEAR COMPRESSION STOCKINGS DAILY AND TAKE OFF AT BEDTIME. Compression stockings are elastic socks that squeeze the legs. They help to increase blood flow to the legs and to decrease swelling in the legs from fluid retention, and reduce the chance of developing blood clots in the lower legs. Please put on in the AM when dressing and off at night when dressing for bed.  LET THEM KNOW THAT YOU NEED KNEE HIGH'S WITH COMPRESSION OF 15-20 mmhg.  [employer]  [place] ([place] [phone]); [place], [place] [address]; [phone]  EMAIL   [email or web address].  PLEASE MAKE SURE TO ELEVATE YOUR FEET & LEGS WHILE SITTING, THIS WILL HELP WITH THE SWELLING ALSO.   Follow-Up: Your next appointment:  1 month(s) In Person with Coletta Memos, FNP or PHARMACIST     At [facility], you and your health needs are our priority.  As part of our continuing mission to provide you with exceptional heart care, we have created designated Provider Care Teams.  These Care Teams include your primary Cardiologist (physician) and Advanced Practice Providers (APPs -  Physician Assistants and Nurse  Practitioners) who all work together to provide you with the care you need, when you need it.  We recommend signing up for the patient portal called "MyChart".  Sign up information is provided on this After Visit Summary.  MyChart is used to connect with patients for Virtual Visits (Telemedicine).  Patients are able to view lab/test results, encounter notes, upcoming appointments, etc.  Non-urgent messages can be sent to your provider as well.   To learn more about what you can do with MyChart, go to [email or web address].

## 2021-01-26 LAB — POCT INR: INR: 2.6 (ref 2.0–3.0)

## 2021-01-27 ENCOUNTER — Other Ambulatory Visit (HOSPITAL_COMMUNITY): Payer: Self-pay | Admitting: Physician Assistant

## 2021-01-30 ENCOUNTER — Other Ambulatory Visit (HOSPITAL_COMMUNITY): Payer: Self-pay | Admitting: *Deleted

## 2021-01-30 MED ORDER — DOFETILIDE 250 MCG PO CAPS: 250.0000 ug | ORAL_CAPSULE | Freq: Two times a day (BID) | ORAL | 1 refills | Status: DC

## 2021-02-01 NOTE — Progress Notes (Signed)
Subjective:    Patient ID: [name], female    DOB: 1950-05-18, 70 y.o.   MRN: [id number]  HPI female never smoker followed for asthma complicated by history of lupus, aortic valve replacement/ pacemaker/ warfarin. Office spirometry 04/22/15- -------------------------------------------------------------------------------   09/09/19- 70 year old female never smoker followed for Asthma complicated by history of lupus, aortic valve replacement/ pacemaker/ warfarin, obesity, Prednisone 5 mg daily (for lupus), Wixela 250, albuterol hfa Had 2 Phizer Covax Asthma control very good w/o signif exacerb. Usually 1 puff Wixela daily remains sufficient. Meds reviewed. Had sinus CT for what turned out to be dental abscess- ultimately had tooth pulled.  ECHO- 08/26/19- EF 40-45%, mild PAH, RVE, Grade 2DD CXR 09/08/2018- IMPRESSION: Stable cardiomegaly. No acute abnormality noted. CT max/fac 01/12/19- IMPRESSION: 1. Mild but possibly odontogenic mucosal thickening in the left maxillary alveolar recess, note left anterior molar periapical lucency. 2. Minimal to mild mucosal thickening elsewhere, including in the left posterior ethmoid and left sphenoid sinuses. 3. Rightward nasal septal deviation and spurring.  02/02/21- 70 year old female never smoker followed for Asthma complicated by history of lupus, aortic valve replacement/ pacemaker/ warfarin, obesity, Gout,  -Prednisone 5 mg daily (for lupus), Wixela 250, albuterol hfa Covid vax- 3 Phizer Flu vax-had -----Patient feels like her breathing is about the same since last visit. 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  No routine cough or wheeze and no acute events since last year.  She does notice dyspnea on exertion walking distances or climbing stairs. Continues to follow with her cardiologist.  Review of Systems- see HPI    + = positive Constitutional:   No-   weight  loss, night sweats, fevers, chills, fatigue, lassitude. HEENT:   No-  headaches, difficulty swallowing, tooth/dental problems, sore throat,       No-  sneezing, itching, +ear pressure, + controlled nasal congestion, post nasal drip,  CV:  No-   chest pain, orthopnea, PND, swelling in lower extremities, anasarca,dizziness, palpitations Resp: +shortness of breath with exertion or at rest.              No-   productive cough, + non-productive cough,  No-  coughing up of blood.              No-   change in color of mucus.  No- wheezing.   Skin: No-   rash or lesions. GI:  No-   heartburn, indigestion, abdominal pain, nausea, vomiting,  GU:  MS:  No-   joint pain or swelling.   Neuro- nothing unusual  Psych:  No- change in mood or affect. No depression or anxiety.  No memory loss.  Objective:   Physical Exam General- Alert, Oriented, Affect-appropriate, Distress- none acute;  +obese Skin- rash-none, lesions- none, excoriation- none Lymphadenopathy- none Head- atraumatic            Eyes- Gross vision intact, PERRLA, conjunctivae clear secretions            Ears- Hearing, canals normal            Nose- Clear, no-Septal dev, mucus, polyps, erosion, perforation             Throat- Mallampati III , mucosa clear , drainage- none, tonsils- atrophic Neck- flexible , trachea midline, no stridor , thyroid nl, carotid no bruit Chest - symmetrical excursion , unlabored           Heart/CV- RRR , +crisp valve  sounds,  1/6 Systolic AS  Murmur, no gallop  , no rub, nl s1 s2                           - JVD+1 , edema+1 in feet, stasis changes- none, varices- none           Lung- wheeze-none, cough- none , dullness-none, rub- none, rales-none           Chest wall- L pacer Abd-  Br/ Gen/ Rectal- Not done, not indicated Extrem- cyanosis- none, clubbing, none, atrophy- none, strength- nl,        + superficial varices Neuro- grossly intact to observation  Assessment & Plan:

## 2021-02-02 ENCOUNTER — Ambulatory Visit: Payer: Medicare Other | Admitting: Internal Medicine

## 2021-02-02 ENCOUNTER — Encounter: Payer: Self-pay | Admitting: Internal Medicine

## 2021-02-02 ENCOUNTER — Other Ambulatory Visit: Payer: Self-pay

## 2021-02-02 DIAGNOSIS — J4520 Mild intermittent asthma, uncomplicated: Secondary | ICD-10-CM

## 2021-02-02 DIAGNOSIS — I5043 Acute on chronic combined systolic (congestive) and diastolic (congestive) heart failure: Secondary | ICD-10-CM | POA: Diagnosis not present

## 2021-02-02 NOTE — Patient Instructions (Signed)
I'm glad you are doing so well.   Try to keep walking to keep up your endurance. We can refill your inhalers when needed.   Please call if we can help

## 2021-02-02 NOTE — Assessment & Plan Note (Signed)
Mild intermittent uncomplicated. Plan-we will keep her inhalers on her list.  General discussion of vaccines.

## 2021-02-02 NOTE — Assessment & Plan Note (Signed)
She denies recent acute events but continues close follow-up with cardiology.

## 2021-02-08 ENCOUNTER — Ambulatory Visit (INDEPENDENT_AMBULATORY_CARE_PROVIDER_SITE_OTHER): Payer: Medicare Other | Admitting: Cardiology

## 2021-02-08 ENCOUNTER — Telehealth: Payer: Self-pay

## 2021-02-08 DIAGNOSIS — Z5181 Encounter for therapeutic drug level monitoring: Secondary | ICD-10-CM | POA: Diagnosis not present

## 2021-02-08 DIAGNOSIS — Z952 Presence of prosthetic heart valve: Secondary | ICD-10-CM

## 2021-02-08 LAB — POCT INR: INR: 2.8 (ref 2.0–3.0)

## 2021-02-08 NOTE — Telephone Encounter (Signed)
Spoke to patient and reminded her to check INR.  She verbalized understanding

## 2021-02-23 ENCOUNTER — Telehealth: Payer: Self-pay

## 2021-02-23 ENCOUNTER — Ambulatory Visit (INDEPENDENT_AMBULATORY_CARE_PROVIDER_SITE_OTHER): Payer: Medicare Other | Admitting: Cardiology

## 2021-02-23 DIAGNOSIS — Z952 Presence of prosthetic heart valve: Secondary | ICD-10-CM

## 2021-02-23 DIAGNOSIS — Z5181 Encounter for therapeutic drug level monitoring: Secondary | ICD-10-CM | POA: Diagnosis not present

## 2021-02-23 LAB — POCT INR: INR: 2 (ref 2.0–3.0)

## 2021-02-23 NOTE — Telephone Encounter (Signed)
I spoke to the patient and reminded her to check INR. °

## 2021-02-24 DIAGNOSIS — Z952 Presence of prosthetic heart valve: Secondary | ICD-10-CM | POA: Diagnosis not present

## 2021-02-24 DIAGNOSIS — I480 Paroxysmal atrial fibrillation: Secondary | ICD-10-CM | POA: Diagnosis not present

## 2021-02-24 DIAGNOSIS — Z7901 Long term (current) use of anticoagulants: Secondary | ICD-10-CM | POA: Diagnosis not present

## 2021-02-28 NOTE — Progress Notes (Signed)
Cardiology Clinic Note   Patient Name: [name] Date of Encounter: 03/03/2021  Primary Care Provider:  [name], MD Primary Cardiologist:  Quay Burow, MD  Patient Profile    [name] 71 year old female presents to the clinic today for follow-up evaluation of her acute on chronic combined systolic and diastolic CHF.  Past Medical History    Past Medical History:  Diagnosis Date   Antiphospholipid antibody positive 06/28/2017   Asthma    CAD (coronary artery disease)    a. normal cors by cath in 03/2015   Carotid artery disease ([place])    Chronic anticoagulation    Complete heart block ([place]) 02/2013   a. Implantation of a dual chamber pacemaker in 02/2013 by Dr Rayann Heman. STJ model number 1245 pacemaker with model number 2088 right ventricular lead.    Dyslipidemia    H/O Doppler ultrasound 10/03/2010   carotid duplex - R ICA normal patency; L CCA-ICA bypass gradt patent common to interal cartoid bypass graft    H/O mechanical aortic valve replacement    History of echocardiogram 11/08/2011   EF >55%; mild-mod concentric LVH; trace aortic regurg.; LA mild-mod dilated   History of nuclear stress test 01/20/2010   normal pattern of perfusion; low risk scan   Hypertension    Lupus (systemic lupus erythematosus) ([facility])    PVD (peripheral vascular disease) ([facility])    60% R renal artery stenosis; non-functioning L kidney   Past Surgical History:  Procedure Laterality Date   AORTIC VALVE REPLACEMENT  11/29/006   [facility]; [facility]; on coumadin   CARDIAC CATHETERIZATION  11/01/2004   define anatomy    CARDIAC CATHETERIZATION N/A 04/19/2015   Procedure: Coronary Angiography;  Surgeon: Peter M [name], MD;  Location: [facility];  Service: Cardiovascular;  Laterality: N/A;   CAROTID ENDARTERECTOMY     lt.   CHOLECYSTECTOMY  1999   EXPLORATORY LAPAROTOMY  04/12/2012   small bowel perf; ileostomy & R hemicolectomy    PACEMAKER INSERTION  03/06/2013   SJM Assurity DR  PPM implanted by Dr Rayann Heman for complete heart block   PERMANENT PACEMAKER INSERTION N/A 03/06/2013   Procedure: PERMANENT PACEMAKER INSERTION;  Surgeon: Coralyn Mark, MD;  Location: [facility];  Service: Cardiovascular;  Laterality: N/A;   TEMPORARY PACEMAKER INSERTION N/A 03/06/2013   Procedure: TEMPORARY PACEMAKER INSERTION;  Surgeon: Coralyn Mark, MD;  Location: [facility];  Service: Cardiovascular;  Laterality: N/A;   TUBAL LIGATION  11/1982    Allergies  Allergies  Allergen Reactions   Spironolactone Other (See Comments)    Hyperkalemia    History of Present Illness    [name] has a PMH of atrial fibrillation with mechanical AVR on warfarin, hypertension, chronic combined systolic and diastolic CHF, and renal insufficiency.  Her echocardiogram 12/20/2020 showed an LVEF of 30-35%.  She was referred to pharmacy by Dr. Rayann Heman for management of her heart failure medication.  She was initiated on Entresto 24-26 and repeat labs showed hyperkalemia.  Her lab work from 01/09/2021 showed a potassium of 5.4.  She was given 2 days of Lokelma and her follow-up lab work on 01/16/2021 showed a potassium of 4.4.  During her follow-up visit with pharmacy 12/30/2020 she shortness of breath with increased physical activity.  She denied chest pain and palpitations.  She was able to complete all of her ADLs.  She had not been checking her blood pressure at home because she felt her blood pressure cuff  was not accurate.  She was noted to have bilateral lower extremity swelling was compliant with her furosemide.  She had been weighing herself daily and reported that it was stable.  Of note she was also unable to tolerate spironolactone in 2018 due to hyperkalemia.  She presented to the clinic 01/24/21 for follow-up evaluation stated she felt well.  She was also seeing nephrology. We reviewed her prior medications and her increased potassium.  She did not tolerate Entresto.  We started  her on losartan  25 mg daily.  I gave her the [place] support stocking sheet and a salty 6 diet sheet.  We planned follow-up for 1 month with me or pharmacy for up titration of her losartan.  She presents to the clinic today for follow-up evaluation states she feels well today.  She did have a cough over the Christmas holidays.  She feels that she picked up the virus from her granddaughter who was staying with her at the time.  She feels she is recovered well.  Her furosemide is being dosed by nephrology.  She is currently taking 40 mg daily.  She brings in her BMP today that shows a creatinine of 1.61.  I will continue her current dosing of her losartan and repeat her echocardiogram.  She reports that she has not eating out and avoid salt.  This is made a significant difference in her weight and lower extremity swelling.  She is euvolemic today.  And her weight is down another 2 pounds.  She does not exercise as much during the fall/winter due to weather conditions.  We will have her follow-up in 4 months.  Today she denies chest pain, shortness of breath, lower extremity edema, fatigue, palpitations, melena, hematuria, hemoptysis, diaphoresis, weakness, presyncope, syncope, orthopnea, and PND.   Home Medications    Prior to Admission medications   Medication Sig Start Date End Date Taking? Authorizing Provider  acetaminophen (TYLENOL) 325 MG tablet Take 650 mg by mouth every 6 (six) hours as needed for moderate pain.     [provider]  albuterol (PROVENTIL HFA;VENTOLIN HFA) 108 (90 Base) MCG/ACT inhaler Inhale 2 puffs into the lungs every 6 (six) hours as needed for wheezing or shortness of breath. 06/10/17   Baird Lyons D, MD  allopurinol (ZYLOPRIM) 100 MG tablet Take 100 mg by mouth daily.     [provider]  calcium carbonate (OSCAL) 1500 (600 Ca) MG TABS tablet Take 1 tablet by mouth 2 (two) times daily.    [provider]  cetirizine (ZYRTEC) 10 MG tablet Take 10 mg by mouth at  bedtime.    [provider]  chlorhexidine (PERIDEX) 0.12 % solution Use as directed 10 mLs in the mouth or throat 2 (two) times daily as needed (before dental appts).  03/31/15   [provider]  cholecalciferol (VITAMIN D) 1000 UNITS tablet Take 2,000 Units by mouth 2 (two) times daily.     [provider]  clotrimazole-betamethasone (LOTRISONE) cream as needed. 11/04/18   [provider]  colestipol (COLESTID) 1 g tablet Take 1 g by mouth daily.    [provider]  dicyclomine (BENTYL) 20 MG tablet Take by mouth. 07/15/19   [provider]  dofetilide (TIKOSYN) 250 MCG capsule Take 1 capsule by mouth twice daily 07/22/20   Fenton, Clint R, PA  Ferrous Fumarate-Folic Acid (HEMOCYTE-F) 324-1 MG TABS Take 1 tablet by mouth daily. 09/20/20   [provider]  fluticasone-salmeterol (WIXELA INHUB) 250-50 MCG/ACT  AEPB USE 1 INHALATION BY MOUTH  twice daily 11/25/20   Baird Lyons D, MD  furosemide (LASIX) 20 MG tablet Take 1 tablet (20 mg total) by mouth daily. 10/19/20   [name], MD  hydroxychloroquine (PLAQUENIL) 200 MG tablet Take 200 mg by mouth daily.    [provider]  isosorbide mononitrate (IMDUR) 30 MG 24 hr tablet TAKE 1 TABLET BY MOUTH  DAILY 11/22/20   [name], MD  leflunomide (ARAVA) 10 MG tablet Take 10 mg by mouth daily.    [provider]  loratadine (CLARITIN) 10 MG tablet Take 10 mg by mouth daily.    [provider]  Magnesium 200 MG TABS Take 1 tablet (200 mg total) by mouth daily. 04/15/18   Sherran Needs, NP  metoprolol succinate (TOPROL-XL) 50 MG 24 hr tablet TAKE 1 TABLET BY MOUTH  DAILY WITH OR IMMEDIATELY  FOLLOWING A MEAL 11/22/20   [name], MD  potassium chloride SA (KLOR-CON) 20 MEQ tablet TAKE 1  BY MOUTH TWICE DAILY 07/06/20   Sherran Needs, NP  predniSONE (DELTASONE) 5 MG tablet Take 5 mg by mouth daily.    [provider]  Probiotic Product  (PROBIOTIC DAILY PO) Take 1 tablet by mouth daily.    [provider]  Saccharomyces boulardii (PROBIOTIC) 250 MG CAPS 1 capsule    [provider]  simvastatin (ZOCOR) 20 MG tablet Take 1 tablet by mouth at  bedtime 06/07/15   [name], MD  sodium zirconium cyclosilicate (LOKELMA) 10 g PACK packet Take 10 g by mouth 2 (two) times daily. 01/10/21   Allred, Jeneen Rinks, MD  warfarin (COUMADIN) 5 MG tablet TAKE 1 TO 1 AND 1/2 TABLETS BY MOUTH DAILY AS DIRECTED  BY COUMADIN CLINIC 08/10/20   [name], MD    Family History    Family History  Problem Relation Age of Onset   Alzheimer's disease Father    Cirrhosis Father    Cancer Father        prostate   Heart attack Neg Hx    She indicated that her mother is alive. She indicated that her father is deceased. She indicated that her maternal grandmother is deceased. She indicated that her maternal grandfather is deceased. She indicated that her paternal grandmother is deceased. She indicated that her paternal grandfather is deceased. She indicated that the status of her neg hx is unknown.   Social History    Social History   Socioeconomic History   Marital status: Divorced    Spouse name: Not on file   Number of children: 3   Years of education: Not on file   Highest education level: Not on file  Occupational History   Occupation: Retired  Tobacco Use   Smoking status: Never   Smokeless tobacco: Never  Vaping Use   Vaping Use: Never used  Substance and Sexual Activity   Alcohol use: No   Drug use: No   Sexual activity: Not on file  Other Topics Concern   Not on file  Social History Narrative   Lives alone in [place].   Social Determinants of Health   Financial Resource Strain: Not on file  Food Insecurity: Not on file  Transportation Needs: Not on file  Physical Activity: Not on file  Stress: Not on file  Social Connections: Not on file  Intimate Partner Violence: Not on file      Review of Systems    General:  No chills, fever, night sweats or weight changes.  Cardiovascular:  No chest pain, dyspnea on exertion, edema, orthopnea, palpitations, paroxysmal nocturnal dyspnea. Dermatological: No rash, lesions/masses Respiratory: No cough, dyspnea Urologic: No hematuria, dysuria Abdominal:   No nausea, vomiting, diarrhea, bright red blood per rectum, melena, or hematemesis Neurologic:  No visual changes, wkns, changes in mental status. All other systems reviewed and are otherwise negative except as noted above.  Physical Exam    VS:  BP 116/74 (BP Location: Left Arm, Patient Position: Sitting, Cuff Size: Large)    Pulse 67    Ht 5\' 5"  (1.651 m)    Wt 240 lb 3.2 oz (109 kg)    SpO2 97%    BMI 39.97 kg/m  , BMI Body mass index is 39.97 kg/m. GEN: Well nourished, well developed, in no acute distress. HEENT: normal. Neck: Supple, no JVD, carotid bruits, or masses. Cardiac: RRR, no murmurs, rubs, or gallops. No clubbing, cyanosis, edema.  Radials/DP/PT 2+ and equal bilaterally.  Respiratory:  Respirations regular and unlabored, clear to auscultation bilaterally. GI: Soft, nontender, nondistended, BS + x 4. MS: no deformity or atrophy. Skin: warm and dry, no rash. Neuro:  Strength and sensation are intact. Psych: Normal affect.  Accessory Clinical Findings    Recent Labs: 06/08/2020: Hemoglobin 11.6; Platelets 180 09/19/2020: BNP 809.4 12/09/2020: Magnesium 2.1 01/16/2021: BUN 27; Creatinine, Ser 1.40; Potassium 4.4; Sodium 142   Recent Lipid Panel    Component Value Date/Time   CHOL 174 12/02/2019 1125   TRIG 124 12/02/2019 1125   HDL 99 12/02/2019 1125   CHOLHDL 1.8 12/02/2019 1125   LDLCALC 54 12/02/2019 1125    ECG personally reviewed by me today-none today.  Echocardiogram 12/20/2020  IMPRESSIONS     1. Left ventricular ejection fraction, by estimation, is 30 to 35%. The  left ventricle has moderately decreased function. The left ventricle   demonstrates regional wall motion abnormalities with septal and apical  hypokinesis and septal-lateral  dyssynchrony. The left ventricular internal cavity size was mildly  dilated. There is mild left ventricular hypertrophy. Left ventricular  diastolic parameters are consistent with Grade II diastolic dysfunction  (pseudonormalization).   2. Right ventricular systolic function is mildly reduced. The right  ventricular size is moderately enlarged. There is moderately elevated  pulmonary artery systolic pressure. The estimated right ventricular  systolic pressure is 16.1 mmHg.   3. Left atrial size was severely dilated.   4. Right atrial size was severely dilated.   5. The mitral valve is abnormal. Moderate to severe mitral valve  regurgitation with restriction of the posterior mitral leaflet. No  evidence of mitral stenosis.   6. The tricuspid valve is abnormal. Tricuspid valve regurgitation is  severe (torrential), ? impaired valve movement due to pacemaker. Systolic  flow reversal in the hepatic vein PW doppler pattern.   7. [employer] mechanical aortic valve. Mean gradient 18 mmHg with  trivial aortic insufficiency.   8. Aortic dilatation noted. There is mild dilatation of the ascending  aorta, measuring 40 mm.   9. The inferior vena cava is dilated in size with <50% respiratory  variability, suggesting right atrial pressure of 15 mmHg.   Comparison(s): 12/31/19 EF 40-45%. PA pressure 71mmHg. AV 22mmHg mean PG,  33mmHg peak PG.  Assessment & Plan   1.  Combined systolic and diastolic CHF- Euvolemic today.  Weight today 240.2.  Did not tolerate Entresto due to hyperkalemia.  Also previously did not tolerate spironolactone due to hyperkalemia. Continue metoprolol, furosemide  Continue losartan 25 mg daily Heart healthy low-sodium diet-salty 6 given Increase physical activity as tolerated Repeat echocardiogram   Aortic valve stenosis-no increased DOE or activity intolerance.   Status post aortic valve replacement  AVR 2006.  Echocardiogram 12/20/2020 showed trivial aortic valve regurgitation.  Coronary artery disease-no chest pain today.  Continue metoprolol, simvastatin Heart healthy low-sodium diet-salty 6 given Increase physical activity as tolerated  Carotid artery disease-carotid ultrasound 07/26/2020.  RightI CA 1-39% anf known chronically occluded left ICA GSV bypass graft.  No changes with recent Doppler study. Repeat carotid Dopplers 6/23  Hyperlipidemia-LDL 54 on 12/02/19 Continue simvastatin Heart healthy low-sodium diet-salty 6 given Increase physical activity as tolerated Follows with PCP  Disposition: Follow-up with Dr. Gwenlyn Found or me in 4 months.  Jossie Ng. Patryce Depriest NP-C    03/03/2021, 10:57 AM [place] [address] Office [phone] Fax [phone]  Notice: This dictation was prepared with Dragon dictation along with smaller phrase technology. Any transcriptional errors that result from this process are unintentional and may not be corrected upon review.  I spent 14 minutes examining this patient, reviewing medications, and using patient centered shared decision making involving her cardiac care.  Prior to her visit I spent greater than 20 minutes reviewing her past medical history,  medications, and prior cardiac tests.

## 2021-03-03 ENCOUNTER — Ambulatory Visit: Payer: Medicare Other | Admitting: General Practice

## 2021-03-03 ENCOUNTER — Encounter: Payer: Self-pay | Admitting: General Practice

## 2021-03-03 ENCOUNTER — Other Ambulatory Visit: Payer: Self-pay

## 2021-03-03 VITALS — BP 116/74 | HR 67 | Ht 65.0 in | Wt 240.2 lb

## 2021-03-03 DIAGNOSIS — Z952 Presence of prosthetic heart valve: Secondary | ICD-10-CM | POA: Diagnosis not present

## 2021-03-03 DIAGNOSIS — E782 Mixed hyperlipidemia: Secondary | ICD-10-CM

## 2021-03-03 DIAGNOSIS — I6522 Occlusion and stenosis of left carotid artery: Secondary | ICD-10-CM

## 2021-03-03 DIAGNOSIS — I5042 Chronic combined systolic (congestive) and diastolic (congestive) heart failure: Secondary | ICD-10-CM | POA: Diagnosis not present

## 2021-03-03 DIAGNOSIS — I2510 Atherosclerotic heart disease of native coronary artery without angina pectoris: Secondary | ICD-10-CM

## 2021-03-03 DIAGNOSIS — I5031 Acute diastolic (congestive) heart failure: Secondary | ICD-10-CM | POA: Diagnosis not present

## 2021-03-03 MED ORDER — FUROSEMIDE 40 MG PO TABS: 40.0000 mg | ORAL_TABLET | Freq: Every day | ORAL | 1 refills | Status: DC

## 2021-03-03 NOTE — Addendum Note (Signed)
Addended by: Waylan Rocher on: 03/03/2021 11:00 AM   Modules accepted: Orders

## 2021-03-03 NOTE — Patient Instructions (Signed)
Medication Instructions:  The current medical regimen is effective;  continue present plan and medications as directed. Please refer to the Current Medication list given to you today.   *If you need a refill on your cardiac medications before your next appointment, please call your pharmacy*  Lab Work:    NONE      Testing/Procedures:  Echocardiogram - Your physician has requested that you have an echocardiogram. Echocardiography is a painless test that uses sound waves to create images of your heart. It provides your doctor with information about the size and shape of your heart and how well your hearts chambers and valves are working. This procedure takes approximately one hour. There are no restrictions for this procedure. This will be performed at either our [facility] location - [address] [place] location [facility] 2nd floor.  Follow-Up: Your next appointment:  4 month(s) In Person with Quay Burow, MD  At [facility], you and your health needs are our priority.  As part of our continuing mission to provide you with exceptional heart care, we have created designated Provider Care Teams.  These Care Teams include your primary Cardiologist (physician) and Advanced Practice Providers (APPs -  Physician Assistants and Nurse Practitioners) who all work together to provide you with the care you need, when you need it.  We recommend signing up for the patient portal called "MyChart".  Sign up information is provided on this After Visit Summary.  MyChart is used to connect with patients for Virtual Visits (Telemedicine).  Patients are able to view lab/test results, encounter notes, upcoming appointments, etc.  Non-urgent messages can be sent to your provider as well.   To learn more about what you can do with MyChart, go to [email or web address].

## 2021-03-09 ENCOUNTER — Ambulatory Visit (INDEPENDENT_AMBULATORY_CARE_PROVIDER_SITE_OTHER): Payer: Medicare Other | Admitting: Internal Medicine

## 2021-03-09 DIAGNOSIS — Z952 Presence of prosthetic heart valve: Secondary | ICD-10-CM

## 2021-03-09 DIAGNOSIS — Z5181 Encounter for therapeutic drug level monitoring: Secondary | ICD-10-CM | POA: Diagnosis not present

## 2021-03-09 LAB — POCT INR: INR: 2.2 (ref 2.0–3.0)

## 2021-03-14 ENCOUNTER — Other Ambulatory Visit: Payer: Self-pay

## 2021-03-14 ENCOUNTER — Ambulatory Visit (HOSPITAL_COMMUNITY): Payer: Medicare Other | Attending: Cardiology

## 2021-03-14 DIAGNOSIS — I5042 Chronic combined systolic (congestive) and diastolic (congestive) heart failure: Secondary | ICD-10-CM | POA: Diagnosis not present

## 2021-03-14 LAB — ECHOCARDIOGRAM COMPLETE
AV Mean grad: 9.6 mmHg
AV Peak grad: 17.2 mmHg
Ao pk vel: 2.07 m/s
Area-P 1/2: 4.07 cm2
P 1/2 time: 467 msec
S' Lateral: 4.2 cm

## 2021-03-23 ENCOUNTER — Ambulatory Visit (INDEPENDENT_AMBULATORY_CARE_PROVIDER_SITE_OTHER): Payer: Medicare Other | Admitting: Cardiovascular Disease

## 2021-03-23 DIAGNOSIS — Z5181 Encounter for therapeutic drug level monitoring: Secondary | ICD-10-CM | POA: Diagnosis not present

## 2021-03-23 DIAGNOSIS — Z952 Presence of prosthetic heart valve: Secondary | ICD-10-CM

## 2021-03-23 LAB — POCT INR: INR: 3 (ref 2.0–3.0)

## 2021-04-06 ENCOUNTER — Ambulatory Visit (INDEPENDENT_AMBULATORY_CARE_PROVIDER_SITE_OTHER): Payer: Medicare Other | Admitting: Cardiology

## 2021-04-06 ENCOUNTER — Telehealth: Payer: Self-pay

## 2021-04-06 DIAGNOSIS — Z5181 Encounter for therapeutic drug level monitoring: Secondary | ICD-10-CM | POA: Diagnosis not present

## 2021-04-06 DIAGNOSIS — Z952 Presence of prosthetic heart valve: Secondary | ICD-10-CM | POA: Diagnosis not present

## 2021-04-06 LAB — POCT INR: INR: 2 (ref 2.0–3.0)

## 2021-04-06 NOTE — Telephone Encounter (Signed)
Reminded pt to check INR. Verbalized understanding °

## 2021-04-12 ENCOUNTER — Ambulatory Visit (INDEPENDENT_AMBULATORY_CARE_PROVIDER_SITE_OTHER): Payer: Medicare Other

## 2021-04-12 DIAGNOSIS — I428 Other cardiomyopathies: Secondary | ICD-10-CM

## 2021-04-12 LAB — CUP PACEART REMOTE DEVICE CHECK
Battery Remaining Longevity: 26 mo
Battery Remaining Percentage: 23 %
Battery Voltage: 2.89 V
Brady Statistic AP VP Percent: 25 %
Brady Statistic AP VS Percent: 1 %
Brady Statistic AS VP Percent: 75 %
Brady Statistic AS VS Percent: 1 %
Brady Statistic RA Percent Paced: 23 %
Brady Statistic RV Percent Paced: 99 %
Date Time Interrogation Session: 20230221165323
Implantable Lead Implant Date: 20150116
Implantable Lead Implant Date: 20150116
Implantable Lead Location: 753859
Implantable Lead Location: 753860
Implantable Lead Model: 5076
Implantable Pulse Generator Implant Date: 20150116
Lead Channel Impedance Value: 390 Ohm
Lead Channel Impedance Value: 590 Ohm
Lead Channel Pacing Threshold Amplitude: 0.5 V
Lead Channel Pacing Threshold Amplitude: 0.75 V
Lead Channel Pacing Threshold Pulse Width: 0.4 ms
Lead Channel Pacing Threshold Pulse Width: 0.4 ms
Lead Channel Sensing Intrinsic Amplitude: 12 mV
Lead Channel Sensing Intrinsic Amplitude: 5 mV
Lead Channel Setting Pacing Amplitude: 2 V
Lead Channel Setting Pacing Amplitude: 2.5 V
Lead Channel Setting Pacing Pulse Width: 0.4 ms
Lead Channel Setting Sensing Sensitivity: 2 mV
Pulse Gen Model: 2240
Pulse Gen Serial Number: 7587004

## 2021-04-18 ENCOUNTER — Ambulatory Visit: Payer: Medicare Other | Admitting: Cardiovascular Disease

## 2021-04-18 NOTE — Progress Notes (Signed)
Remote pacemaker transmission.   

## 2021-04-20 ENCOUNTER — Ambulatory Visit (INDEPENDENT_AMBULATORY_CARE_PROVIDER_SITE_OTHER): Payer: Medicare Other | Admitting: Cardiology

## 2021-04-20 DIAGNOSIS — Z5181 Encounter for therapeutic drug level monitoring: Secondary | ICD-10-CM

## 2021-04-20 DIAGNOSIS — Z952 Presence of prosthetic heart valve: Secondary | ICD-10-CM | POA: Diagnosis not present

## 2021-04-20 LAB — POCT INR: INR: 2.1 (ref 2.0–3.0)

## 2021-05-04 ENCOUNTER — Ambulatory Visit (INDEPENDENT_AMBULATORY_CARE_PROVIDER_SITE_OTHER): Payer: Medicare Other | Admitting: Pharmacist Clinician (PhC)/ Clinical Pharmacy Specialist

## 2021-05-04 DIAGNOSIS — Z952 Presence of prosthetic heart valve: Secondary | ICD-10-CM | POA: Diagnosis not present

## 2021-05-04 DIAGNOSIS — Z7901 Long term (current) use of anticoagulants: Secondary | ICD-10-CM | POA: Diagnosis not present

## 2021-05-04 DIAGNOSIS — R760 Raised antibody titer: Secondary | ICD-10-CM | POA: Diagnosis not present

## 2021-05-04 DIAGNOSIS — I480 Paroxysmal atrial fibrillation: Secondary | ICD-10-CM | POA: Diagnosis not present

## 2021-05-04 LAB — POCT INR: INR: 2 (ref 2.0–3.0)

## 2021-05-18 ENCOUNTER — Ambulatory Visit (INDEPENDENT_AMBULATORY_CARE_PROVIDER_SITE_OTHER): Payer: Medicare Other | Admitting: Cardiology

## 2021-05-18 DIAGNOSIS — Z5181 Encounter for therapeutic drug level monitoring: Secondary | ICD-10-CM | POA: Diagnosis not present

## 2021-05-18 DIAGNOSIS — Z952 Presence of prosthetic heart valve: Secondary | ICD-10-CM

## 2021-05-18 LAB — POCT INR: INR: 2.4 (ref 2.0–3.0)

## 2021-06-01 ENCOUNTER — Ambulatory Visit (INDEPENDENT_AMBULATORY_CARE_PROVIDER_SITE_OTHER): Payer: Medicare Other | Admitting: Internal Medicine

## 2021-06-01 DIAGNOSIS — Z952 Presence of prosthetic heart valve: Secondary | ICD-10-CM | POA: Diagnosis not present

## 2021-06-01 DIAGNOSIS — Z5181 Encounter for therapeutic drug level monitoring: Secondary | ICD-10-CM

## 2021-06-01 LAB — POCT INR: INR: 1.6 — AB (ref 2.0–3.0)

## 2021-06-15 ENCOUNTER — Ambulatory Visit (INDEPENDENT_AMBULATORY_CARE_PROVIDER_SITE_OTHER): Payer: Medicare Other | Admitting: Cardiology

## 2021-06-15 DIAGNOSIS — Z5181 Encounter for therapeutic drug level monitoring: Secondary | ICD-10-CM

## 2021-06-15 DIAGNOSIS — Z952 Presence of prosthetic heart valve: Secondary | ICD-10-CM

## 2021-06-15 LAB — POCT INR: INR: 3.1 — AB (ref 2.0–3.0)

## 2021-06-27 ENCOUNTER — Ambulatory Visit: Payer: Medicare Other | Admitting: Cardiovascular Disease

## 2021-06-29 ENCOUNTER — Ambulatory Visit (INDEPENDENT_AMBULATORY_CARE_PROVIDER_SITE_OTHER): Payer: Medicare Other | Admitting: Cardiovascular Disease

## 2021-06-29 DIAGNOSIS — Z952 Presence of prosthetic heart valve: Secondary | ICD-10-CM

## 2021-06-29 DIAGNOSIS — Z5181 Encounter for therapeutic drug level monitoring: Secondary | ICD-10-CM | POA: Diagnosis not present

## 2021-06-29 LAB — POCT INR: INR: 2.5 (ref 2.0–3.0)

## 2021-07-12 ENCOUNTER — Encounter: Payer: Self-pay | Admitting: Cardiovascular Disease

## 2021-07-12 ENCOUNTER — Ambulatory Visit (INDEPENDENT_AMBULATORY_CARE_PROVIDER_SITE_OTHER): Payer: Medicare Other

## 2021-07-12 ENCOUNTER — Ambulatory Visit: Payer: Medicare Other | Admitting: Cardiovascular Disease

## 2021-07-12 ENCOUNTER — Ambulatory Visit (INDEPENDENT_AMBULATORY_CARE_PROVIDER_SITE_OTHER): Payer: Medicare Other | Admitting: Pharmacist

## 2021-07-12 DIAGNOSIS — I10 Essential (primary) hypertension: Secondary | ICD-10-CM

## 2021-07-12 DIAGNOSIS — Z5181 Encounter for therapeutic drug level monitoring: Secondary | ICD-10-CM

## 2021-07-12 DIAGNOSIS — Z952 Presence of prosthetic heart valve: Secondary | ICD-10-CM

## 2021-07-12 DIAGNOSIS — I442 Atrioventricular block, complete: Secondary | ICD-10-CM | POA: Diagnosis not present

## 2021-07-12 DIAGNOSIS — E782 Mixed hyperlipidemia: Secondary | ICD-10-CM | POA: Diagnosis not present

## 2021-07-12 DIAGNOSIS — I428 Other cardiomyopathies: Secondary | ICD-10-CM | POA: Diagnosis not present

## 2021-07-12 DIAGNOSIS — I5189 Other ill-defined heart diseases: Secondary | ICD-10-CM

## 2021-07-12 LAB — CUP PACEART REMOTE DEVICE CHECK
Battery Remaining Longevity: 23 mo
Battery Remaining Percentage: 20 %
Battery Voltage: 2.87 V
Brady Statistic AP VP Percent: 22 %
Brady Statistic AP VS Percent: 1 %
Brady Statistic AS VP Percent: 77 %
Brady Statistic AS VS Percent: 1 %
Brady Statistic RA Percent Paced: 21 %
Brady Statistic RV Percent Paced: 99 %
Date Time Interrogation Session: 20230524035803
Implantable Lead Implant Date: 20150116
Implantable Lead Implant Date: 20150116
Implantable Lead Location: 753859
Implantable Lead Location: 753860
Implantable Lead Model: 5076
Implantable Pulse Generator Implant Date: 20150116
Lead Channel Impedance Value: 360 Ohm
Lead Channel Impedance Value: 580 Ohm
Lead Channel Pacing Threshold Amplitude: 0.5 V
Lead Channel Pacing Threshold Amplitude: 0.75 V
Lead Channel Pacing Threshold Pulse Width: 0.4 ms
Lead Channel Pacing Threshold Pulse Width: 0.4 ms
Lead Channel Sensing Intrinsic Amplitude: 12 mV
Lead Channel Sensing Intrinsic Amplitude: 4.5 mV
Lead Channel Setting Pacing Amplitude: 2 V
Lead Channel Setting Pacing Amplitude: 2.5 V
Lead Channel Setting Pacing Pulse Width: 0.4 ms
Lead Channel Setting Sensing Sensitivity: 2 mV
Pulse Gen Model: 2240
Pulse Gen Serial Number: 7587004

## 2021-07-12 LAB — POCT INR: INR: 3 (ref 2.0–3.0)

## 2021-07-12 NOTE — Assessment & Plan Note (Addendum)
History of diastolic heart failure on oral diuretics.  She apparently was tried on spironolactone but this was discontinued because of hyperkalemia.

## 2021-07-12 NOTE — Assessment & Plan Note (Signed)
History of complete heart block status post [facility] permanent transvenous pacemaker implantation January 2015.

## 2021-07-12 NOTE — Assessment & Plan Note (Signed)
History of essential hypertension a blood pressure measured today at 130/76.  She is on metoprolol and losartan.

## 2021-07-12 NOTE — Assessment & Plan Note (Signed)
History of [employer] Jude AVR by Dr. [name] in 2006.  Recent 2D echo performed 03/14/2021 revealed normal LV systolic function, grade 2 diastolic dysfunction and a well-functioning aortic mechanical prosthesis.  This will be repeated on an annual basis.  She did have normal coronary arteries at the time of cardiac catheterization and by subsequent cath as well performed Dr. [name] 04/19/2015.

## 2021-07-12 NOTE — Assessment & Plan Note (Signed)
History of hyperlipidemia on statin therapy followed by her PCP. 

## 2021-07-12 NOTE — Progress Notes (Signed)
07/12/2021 [name]   Jul 28, 1950  [id number]  Primary Physician [name], MD Primary Cardiologist: [name] MD FACP, [place], [place], [name]  HPI:  [name] is a 71 y.o.  moderately overweight, almost divorced Caucasian female mother of 59, grandmother to 3 grandchildren who I last saw in the office 10/19/2020.  She had a St. Jude AVR performed by Dr. Ceasar Mons September of 2006 because of severe aortic insufficiency. She had normal coronary arteries by cath at that time. A look at her renal arteries and demonstrated a 60% right renal artery stenosis and patent accessory left renal arteries. By duplex she does have a non-functioning left kidney. Her other problems include hypertension and hyperlipidemia. She also has a history of lupus followed by Dr. Jalene Mullet. An echo performed a year ago showed a well-functioning aortic prosthesis, and recent renal Dopplers revealed a widely patent right renal artery. She had a small bowel obstruction and underwent surgery at [facility] for perforated small bowel. She had an exploratory laparotomy with ileostomy and a right hemicolectomy 04/12/12. She saw [name] PAC back in our office 06/12/12 and her medicines were adjusted.   She was admitted in early January for symptomatic complete heart block underwent permanent pacemaker insertion by Dr. Thompson Grayer.[name] She was remitted approximately week later with orthostatic hypotension and was dehydrated. She responded to fluid resuscitation. Since I saw her a year ago she was admitted with heart failure requiring diuresis secondary to diastolic dysfunction and dietary indiscretion. In addition, she recently underwent an cardiac catheterization by Dr. [name] because of chest pain 04/19/15  revealing normal coronary arteries.  She was admitted to [facility] of volume overload 12/21/15 and was diuresed     She was complaining of increasing dyspnea.  She was found to be in A. fib and was  admitted for Tikosyn load on 04/01/2018.  She converted to sinus rhythm and feels clinically improved.   She  continues to do well ostensibly maintaining sinus rhythm on Tikosyn and warfarin.  She did see Dr. Rayann Heman back who confirmed continued improvement.  She does have diastolic heart failure on diuretics with grade 1 diastolic dysfunction on 2D echo 05/24/2017.  Recent carotid and renal Doppler studies showed an occluded left renal artery with a widely patent right renal artery and an occluded left internal carotid with a widely patent right.   She is followed in the A. fib clinic extensively maintaining sinus rhythm after her Tikosyn load.  Her most recent 2D echo performed 08/26/2019 revealed an EF of 40% with a well-functioning aortic mechanical prosthesis on Coumadin anticoagulation.    Since I saw her in the office 9 months ago she continues to feel well.  She denies chest pain or shortness of breath.  Her 2D echo revealed normal LV systolic function with grade 2 diastolic dysfunction and a well functioning aortic mechanical prosthesis performed 03/10/2021.  She was tried on spironolactone however this caused hyperkalemia and it was discontinued.  Her Delene Loll was also changed to losartan.  Her carotid Dopplers have remained stable with a widely patent right carotid artery.   Current Meds  Medication Sig   acetaminophen (TYLENOL) 325 MG tablet Take 650 mg by mouth every 6 (six) hours as needed for moderate pain.    albuterol (PROVENTIL HFA;VENTOLIN HFA) 108 (90 Base) MCG/ACT inhaler Inhale 2 puffs into the lungs every 6 (six) hours as needed for wheezing or shortness of breath.   allopurinol (ZYLOPRIM)  100 MG tablet Take 100 mg by mouth daily.    calcium carbonate (OSCAL) 1500 (600 Ca) MG TABS tablet Take 1 tablet by mouth 2 (two) times daily.   cetirizine (ZYRTEC) 10 MG tablet Take 10 mg by mouth at bedtime.   chlorhexidine (PERIDEX) 0.12 % solution Use as directed 10 mLs in the mouth or throat 2  (two) times daily as needed (before dental appts).    cholecalciferol (VITAMIN D) 1000 UNITS tablet Take 2,000 Units by mouth 2 (two) times daily.    clotrimazole-betamethasone (LOTRISONE) cream as needed.   colestipol (COLESTID) 1 g tablet Take 1 g by mouth daily.   dicyclomine (BENTYL) 20 MG tablet Take by mouth.   dofetilide (TIKOSYN) 250 MCG capsule Take 1 capsule (250 mcg total) by mouth 2 (two) times daily.   Ferrous Fumarate-Folic Acid (HEMOCYTE-F) 324-1 MG TABS Take 1 tablet by mouth daily.   fluticasone-salmeterol (WIXELA INHUB) 250-50 MCG/ACT AEPB USE 1 INHALATION BY MOUTH  twice daily   furosemide (LASIX) 40 MG tablet Take 1 tablet (40 mg total) by mouth daily.   hydroxychloroquine (PLAQUENIL) 200 MG tablet Take 200 mg by mouth daily.   isosorbide mononitrate (IMDUR) 30 MG 24 hr tablet TAKE 1 TABLET BY MOUTH  DAILY   leflunomide (ARAVA) 10 MG tablet Take 10 mg by mouth daily.   loratadine (CLARITIN) 10 MG tablet Take 10 mg by mouth daily.   Magnesium 200 MG TABS Take 1 tablet (200 mg total) by mouth daily.   metoprolol succinate (TOPROL-XL) 50 MG 24 hr tablet TAKE 1 TABLET BY MOUTH  DAILY WITH OR IMMEDIATELY  FOLLOWING A MEAL   predniSONE (DELTASONE) 5 MG tablet Take 5 mg by mouth daily.   Probiotic Product (PROBIOTIC DAILY PO) Take 1 tablet by mouth daily.   Saccharomyces boulardii (PROBIOTIC) 250 MG CAPS 1 capsule   simvastatin (ZOCOR) 20 MG tablet Take 1 tablet by mouth at  bedtime   sodium zirconium cyclosilicate (LOKELMA) 10 g PACK packet Take 10 g by mouth 2 (two) times daily.   warfarin (COUMADIN) 5 MG tablet TAKE 1 TO 1 AND 1/2 TABLETS BY MOUTH DAILY AS DIRECTED  BY COUMADIN CLINIC     Allergies  Allergen Reactions   Spironolactone Other (See Comments)    Hyperkalemia    Social History   Socioeconomic History   Marital status: Divorced    Spouse name: Not on file   Number of children: 3   Years of education: Not on file   Highest education level: Not on file   Occupational History   Occupation: Retired  Tobacco Use   Smoking status: Never   Smokeless tobacco: Never  Vaping Use   Vaping Use: Never used  Substance and Sexual Activity   Alcohol use: No   Drug use: No   Sexual activity: Not on file  Other Topics Concern   Not on file  Social History Narrative   Lives alone in [place].   Social Determinants of Health   Financial Resource Strain: Not on file  Food Insecurity: Not on file  Transportation Needs: Not on file  Physical Activity: Not on file  Stress: Not on file  Social Connections: Not on file  Intimate Partner Violence: Not on file     Review of Systems: General: negative for chills, fever, night sweats or weight changes.  Cardiovascular: negative for chest pain, dyspnea on exertion, edema, orthopnea, palpitations, paroxysmal nocturnal dyspnea or shortness of breath Dermatological: negative for rash Respiratory: negative for  cough or wheezing Urologic: negative for hematuria Abdominal: negative for nausea, vomiting, diarrhea, bright red blood per rectum, melena, or hematemesis Neurologic: negative for visual changes, syncope, or dizziness All other systems reviewed and are otherwise negative except as noted above.    Blood pressure 130/76, pulse 69, height 5\' 5"  (1.651 m), weight 240 lb 9.6 oz (109.1 kg), SpO2 97 %.  General appearance: alert and no distress Neck: no adenopathy, no JVD, supple, symmetrical, trachea midline, thyroid not enlarged, symmetric, no tenderness/mass/nodules, and soft right carotid bruit Lungs: clear to auscultation bilaterally Heart: Crisp aortic prosthetic valve sounds with a soft outflow tract murmur Extremities: extremities normal, atraumatic, no cyanosis or edema Pulses: 2+ and symmetric Skin: Skin color, texture, turgor normal. No rashes or lesions Neurologic: Grossly normal  EKG ventricular paced rhythm at 69.  Personally reviewed this EKG.  ASSESSMENT AND PLAN:   Complete  heart block ([place]) History of complete heart block status post [place] permanent transvenous pacemaker implantation January 2015.  S/P AVR (aortic valve replacement) History of Saint Jude AVR by Dr. [name] in 2006.  Recent 2D echo performed 03/14/2021 revealed normal LV systolic function, grade 2 diastolic dysfunction and a well-functioning aortic mechanical prosthesis.  This will be repeated on an annual basis.  She did have normal coronary arteries at the time of cardiac catheterization and by subsequent cath as well performed Dr. [name] 04/19/2015.  Hyperlipidemia History of hyperlipidemia on statin therapy followed by her PCP  Essential hypertension History of essential hypertension a blood pressure measured today at 130/76.  She is on metoprolol and losartan.  Carotid stenosis History of left carotid bypass back in 2000 with an occluded bypass graft, occluded left carotid artery and widely patent right carotid by duplex ultrasound 07/26/2020.  This will be repeated on an annual basis.  Diastolic dysfunction History of diastolic heart failure on oral diuretics.  She apparently was tried on spironolactone but this was discontinued because of hyperkalemia.     [name] MD FACP,FACC,FAHA, [facility] 07/12/2021 2:01 PM

## 2021-07-12 NOTE — Patient Instructions (Signed)
Description   continue 1 tablet daily except 1.5 tablets each Monday and Friday.  Recheck INR in 2 weeks. Coumadin Clinic [phone]

## 2021-07-12 NOTE — Patient Instructions (Signed)
Medication Instructions:  Your physician recommends that you continue on your current medications as directed. Please refer to the Current Medication list given to you today.  *If you need a refill on your cardiac medications before your next appointment, please call your pharmacy*   Testing/Procedures: Your physician has requested that you have an echocardiogram. Echocardiography is a painless test that uses sound waves to create images of your heart. It provides your doctor with information about the size and shape of your heart and how well your heart's chambers and valves are working. This procedure takes approximately one hour. There are no restrictions for this procedure. To be done in Jan. 2024. This procedure will be done at [address]     Follow-Up: At [facility], you and your health needs are our priority.  As part of our continuing mission to provide you with exceptional heart care, we have created designated Provider Care Teams.  These Care Teams include your primary Cardiologist (physician) and Advanced Practice Providers (APPs -  Physician Assistants and Nurse Practitioners) who all work together to provide you with the care you need, when you need it.  We recommend signing up for the patient portal called "MyChart".  Sign up information is provided on this After Visit Summary.  MyChart is used to connect with patients for Virtual Visits (Telemedicine).  Patients are able to view lab/test results, encounter notes, upcoming appointments, etc.  Non-urgent messages can be sent to your provider as well.   To learn more about what you can do with MyChart, go to [email or web address].    Your next appointment:   6 month(s)  The format for your next appointment:   In Person  Provider:   Coletta Memos, FNP       Then, Quay Burow, MD will plan to see you again in 12 month(s).

## 2021-07-12 NOTE — Assessment & Plan Note (Signed)
History of left carotid bypass back in 2000 with an occluded bypass graft, occluded left carotid artery and widely patent right carotid by duplex ultrasound 07/26/2020.  This will be repeated on an annual basis.

## 2021-07-14 ENCOUNTER — Other Ambulatory Visit (HOSPITAL_COMMUNITY): Payer: Self-pay | Admitting: Cardiovascular Disease

## 2021-07-14 DIAGNOSIS — Z8679 Personal history of other diseases of the circulatory system: Secondary | ICD-10-CM

## 2021-07-25 ENCOUNTER — Other Ambulatory Visit (HOSPITAL_COMMUNITY): Payer: Self-pay | Admitting: Cardiovascular Disease

## 2021-07-25 DIAGNOSIS — I6522 Occlusion and stenosis of left carotid artery: Secondary | ICD-10-CM

## 2021-07-25 NOTE — Progress Notes (Signed)
Remote pacemaker transmission.   

## 2021-07-27 ENCOUNTER — Ambulatory Visit (INDEPENDENT_AMBULATORY_CARE_PROVIDER_SITE_OTHER): Payer: Medicare Other | Admitting: Cardiovascular Disease

## 2021-07-27 DIAGNOSIS — Z5181 Encounter for therapeutic drug level monitoring: Secondary | ICD-10-CM

## 2021-07-27 DIAGNOSIS — Z952 Presence of prosthetic heart valve: Secondary | ICD-10-CM

## 2021-07-27 LAB — POCT INR: INR: 2.1 (ref 2.0–3.0)

## 2021-07-28 ENCOUNTER — Encounter (HOSPITAL_COMMUNITY): Payer: Medicare Other

## 2021-08-01 ENCOUNTER — Other Ambulatory Visit (HOSPITAL_COMMUNITY): Payer: Self-pay | Admitting: Physician Assistant

## 2021-08-02 ENCOUNTER — Ambulatory Visit (HOSPITAL_BASED_OUTPATIENT_CLINIC_OR_DEPARTMENT_OTHER)
Admission: RE | Admit: 2021-08-02 | Discharge: 2021-08-02 | Disposition: A | Discharge status: Home / Self Care | Payer: Medicare Other | Source: Ambulatory Visit | Attending: Cardiovascular Disease | Admitting: Cardiovascular Disease

## 2021-08-02 ENCOUNTER — Ambulatory Visit (HOSPITAL_COMMUNITY)
Admission: RE | Admit: 2021-08-02 | Discharge: 2021-08-02 | Disposition: A | Discharge status: Home / Self Care | Payer: Medicare Other | Source: Ambulatory Visit | Attending: Cardiovascular Disease | Admitting: Cardiovascular Disease

## 2021-08-02 DIAGNOSIS — Z8679 Personal history of other diseases of the circulatory system: Secondary | ICD-10-CM | POA: Insufficient documentation

## 2021-08-02 DIAGNOSIS — I6522 Occlusion and stenosis of left carotid artery: Secondary | ICD-10-CM

## 2021-08-02 DIAGNOSIS — Z95828 Presence of other vascular implants and grafts: Secondary | ICD-10-CM | POA: Diagnosis not present

## 2021-08-03 ENCOUNTER — Telehealth: Payer: Self-pay | Admitting: Cardiovascular Disease

## 2021-08-03 ENCOUNTER — Other Ambulatory Visit: Payer: Self-pay | Admitting: General Practice

## 2021-08-03 ENCOUNTER — Other Ambulatory Visit (HOSPITAL_COMMUNITY): Payer: Self-pay | Admitting: *Deleted

## 2021-08-03 DIAGNOSIS — I5031 Acute diastolic (congestive) heart failure: Secondary | ICD-10-CM

## 2021-08-03 MED ORDER — DOFETILIDE 250 MCG PO CAPS
250.0000 ug | ORAL_CAPSULE | Freq: Two times a day (BID) | ORAL | 1 refills | Status: DC
Start: 2021-08-03 — End: 2022-04-05

## 2021-08-03 MED ORDER — FUROSEMIDE 40 MG PO TABS: 40.0000 mg | ORAL_TABLET | Freq: Every day | ORAL | 1 refills | Status: DC

## 2021-08-03 NOTE — Telephone Encounter (Signed)
*  STAT* If patient is at the pharmacy, call can be transferred to refill team.   1. Which medications need to be refilled? (please list name of each medication and dose if known) furosemide (LASIX) 40 MG tablet  2. Which pharmacy/location (including street and city if local pharmacy) is medication to be sent to? [facility] ([place], [place]  3. Do they need a 30 day or 90 day supply? [place]

## 2021-08-04 ENCOUNTER — Other Ambulatory Visit: Payer: Self-pay | Admitting: General Practice

## 2021-08-04 DIAGNOSIS — I5031 Acute diastolic (congestive) heart failure: Secondary | ICD-10-CM

## 2021-08-10 ENCOUNTER — Ambulatory Visit (INDEPENDENT_AMBULATORY_CARE_PROVIDER_SITE_OTHER): Payer: Medicare Other

## 2021-08-10 DIAGNOSIS — Z952 Presence of prosthetic heart valve: Secondary | ICD-10-CM | POA: Diagnosis not present

## 2021-08-10 DIAGNOSIS — Z5181 Encounter for therapeutic drug level monitoring: Secondary | ICD-10-CM | POA: Diagnosis not present

## 2021-08-10 LAB — POCT INR: INR: 1.5 — AB (ref 2.0–3.0)

## 2021-08-10 NOTE — Patient Instructions (Signed)
Description   SELF TESTER - Called and instructed pt to take 2 tablets today and then START taking 1 tablet daily EXCEPT 1.5 tablets on Mondays, Wednesdays, and Friday.   Recheck INR in 1 week.  Coumadin Clinic [phone]

## 2021-08-11 ENCOUNTER — Other Ambulatory Visit: Payer: Self-pay | Admitting: Cardiovascular Disease

## 2021-08-11 DIAGNOSIS — I4729 Other ventricular tachycardia: Secondary | ICD-10-CM

## 2021-08-14 NOTE — Telephone Encounter (Signed)
Rx refill sent to pharmacy. 

## 2021-08-17 ENCOUNTER — Ambulatory Visit (INDEPENDENT_AMBULATORY_CARE_PROVIDER_SITE_OTHER): Payer: Medicare Other | Admitting: *Deleted

## 2021-08-17 DIAGNOSIS — Z952 Presence of prosthetic heart valve: Secondary | ICD-10-CM

## 2021-08-17 DIAGNOSIS — Z5181 Encounter for therapeutic drug level monitoring: Secondary | ICD-10-CM

## 2021-08-17 LAB — POCT INR: INR: 2.7 (ref 2.0–3.0)

## 2021-08-17 NOTE — Patient Instructions (Signed)
Description   SELF TESTER -Spoke with pt and instructed pt to tak taking 1 tablet daily EXCEPT 1.5 tablets on Mondays, Wednesdays, and Friday.  Recheck INR in 2 weeks-SELF TESTER.  Coumadin Clinic [phone]

## 2021-08-31 ENCOUNTER — Ambulatory Visit (INDEPENDENT_AMBULATORY_CARE_PROVIDER_SITE_OTHER): Payer: Medicare Other | Admitting: *Deleted

## 2021-08-31 DIAGNOSIS — Z5181 Encounter for therapeutic drug level monitoring: Secondary | ICD-10-CM | POA: Diagnosis not present

## 2021-08-31 DIAGNOSIS — Z952 Presence of prosthetic heart valve: Secondary | ICD-10-CM

## 2021-08-31 LAB — POCT INR: INR: 2.6 (ref 2.0–3.0)

## 2021-08-31 NOTE — Patient Instructions (Signed)
Description   SELF TESTER -Spoke with pt and instructed pt to continue taking 1 tablet daily EXCEPT 1.5 tablets on Mondays, Wednesdays, and Friday.  Recheck INR in 2 weeks-SELF TESTER.  Coumadin Clinic [phone]

## 2021-09-05 ENCOUNTER — Other Ambulatory Visit (HOSPITAL_COMMUNITY): Payer: Self-pay | Admitting: Cardiovascular Disease

## 2021-09-05 DIAGNOSIS — I6522 Occlusion and stenosis of left carotid artery: Secondary | ICD-10-CM

## 2021-09-05 DIAGNOSIS — I779 Disorder of arteries and arterioles, unspecified: Secondary | ICD-10-CM

## 2021-09-05 DIAGNOSIS — I701 Atherosclerosis of renal artery: Secondary | ICD-10-CM

## 2021-09-11 IMAGING — CR DG HUMERUS 2V *L*
2 series · 2 of 2 positions shown · non-contrast
Comparison: None.

CLINICAL DATA: Fall

EXAM:
LEFT HUMERUS - 2+ VIEW

[x humerus ap left (1 of 2)]
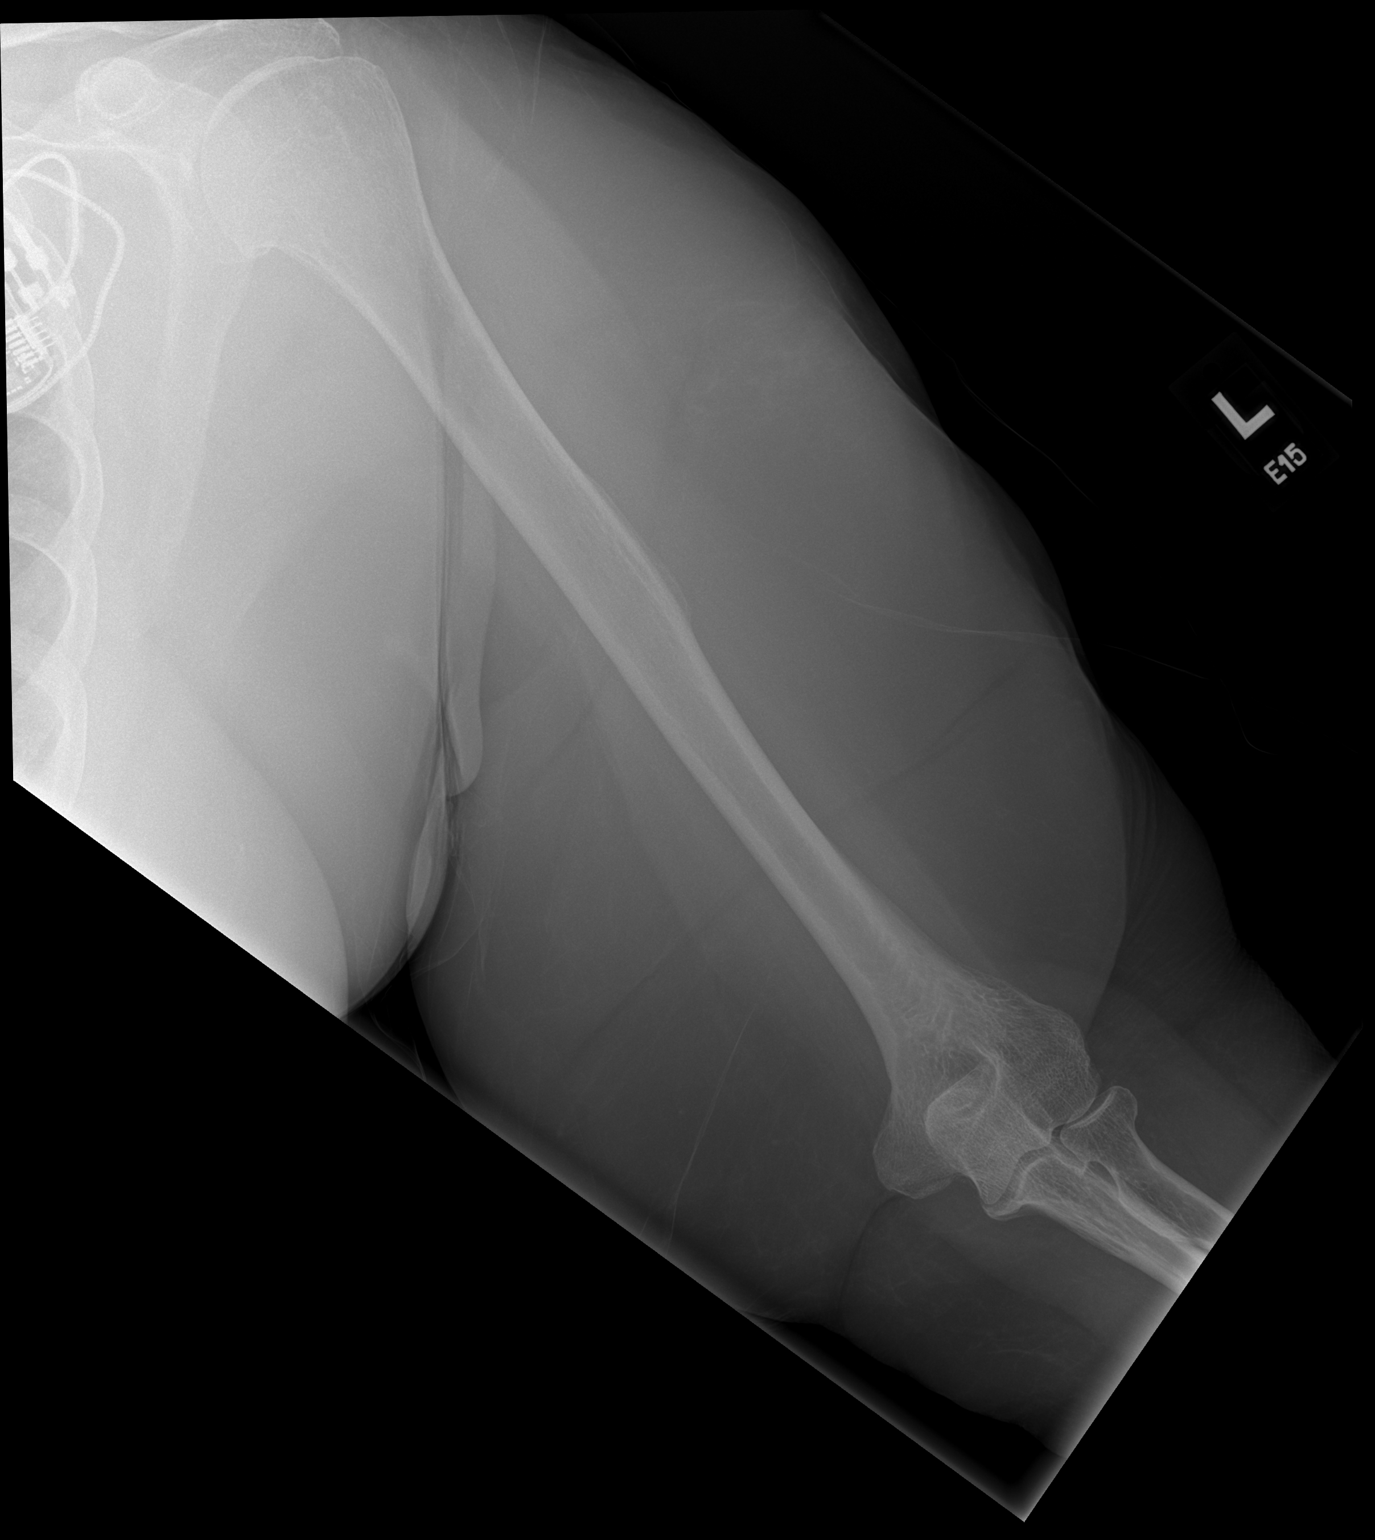

[x humerus ap left (2 of 2)]
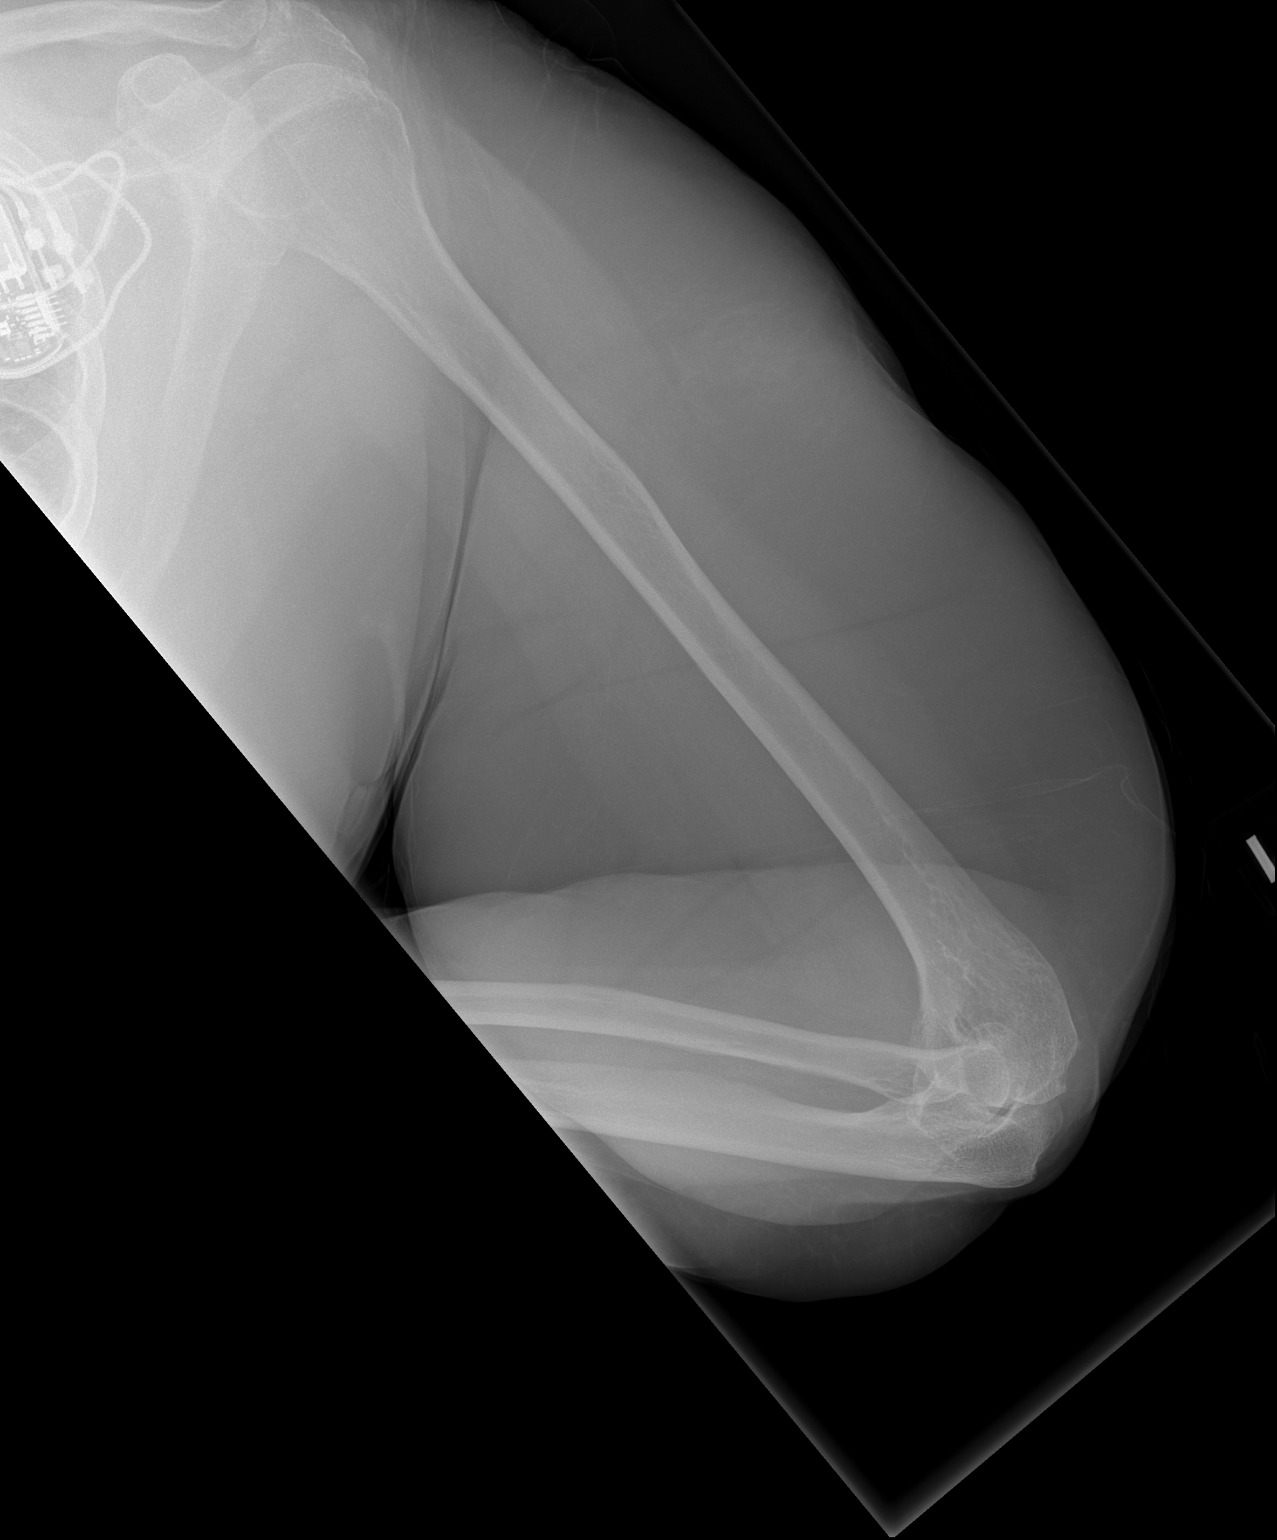

[2 of 2 positions shown; findings below may reference images not displayed]

FINDINGS: There is no evidence of fracture or other focal bone lesions. Soft
tissues are unremarkable. Partially visualized pacemaker generator
overlying the left chest.
IMPRESSION: Negative left humerus radiographs.

## 2021-09-11 IMAGING — CR DG SHOULDER 2+V*L*
2 series · 2 of 2 positions shown · non-contrast
Comparison: None.

CLINICAL DATA: Fall

EXAM:
LEFT SHOULDER - 2+ VIEW

[x shoulder ap left]
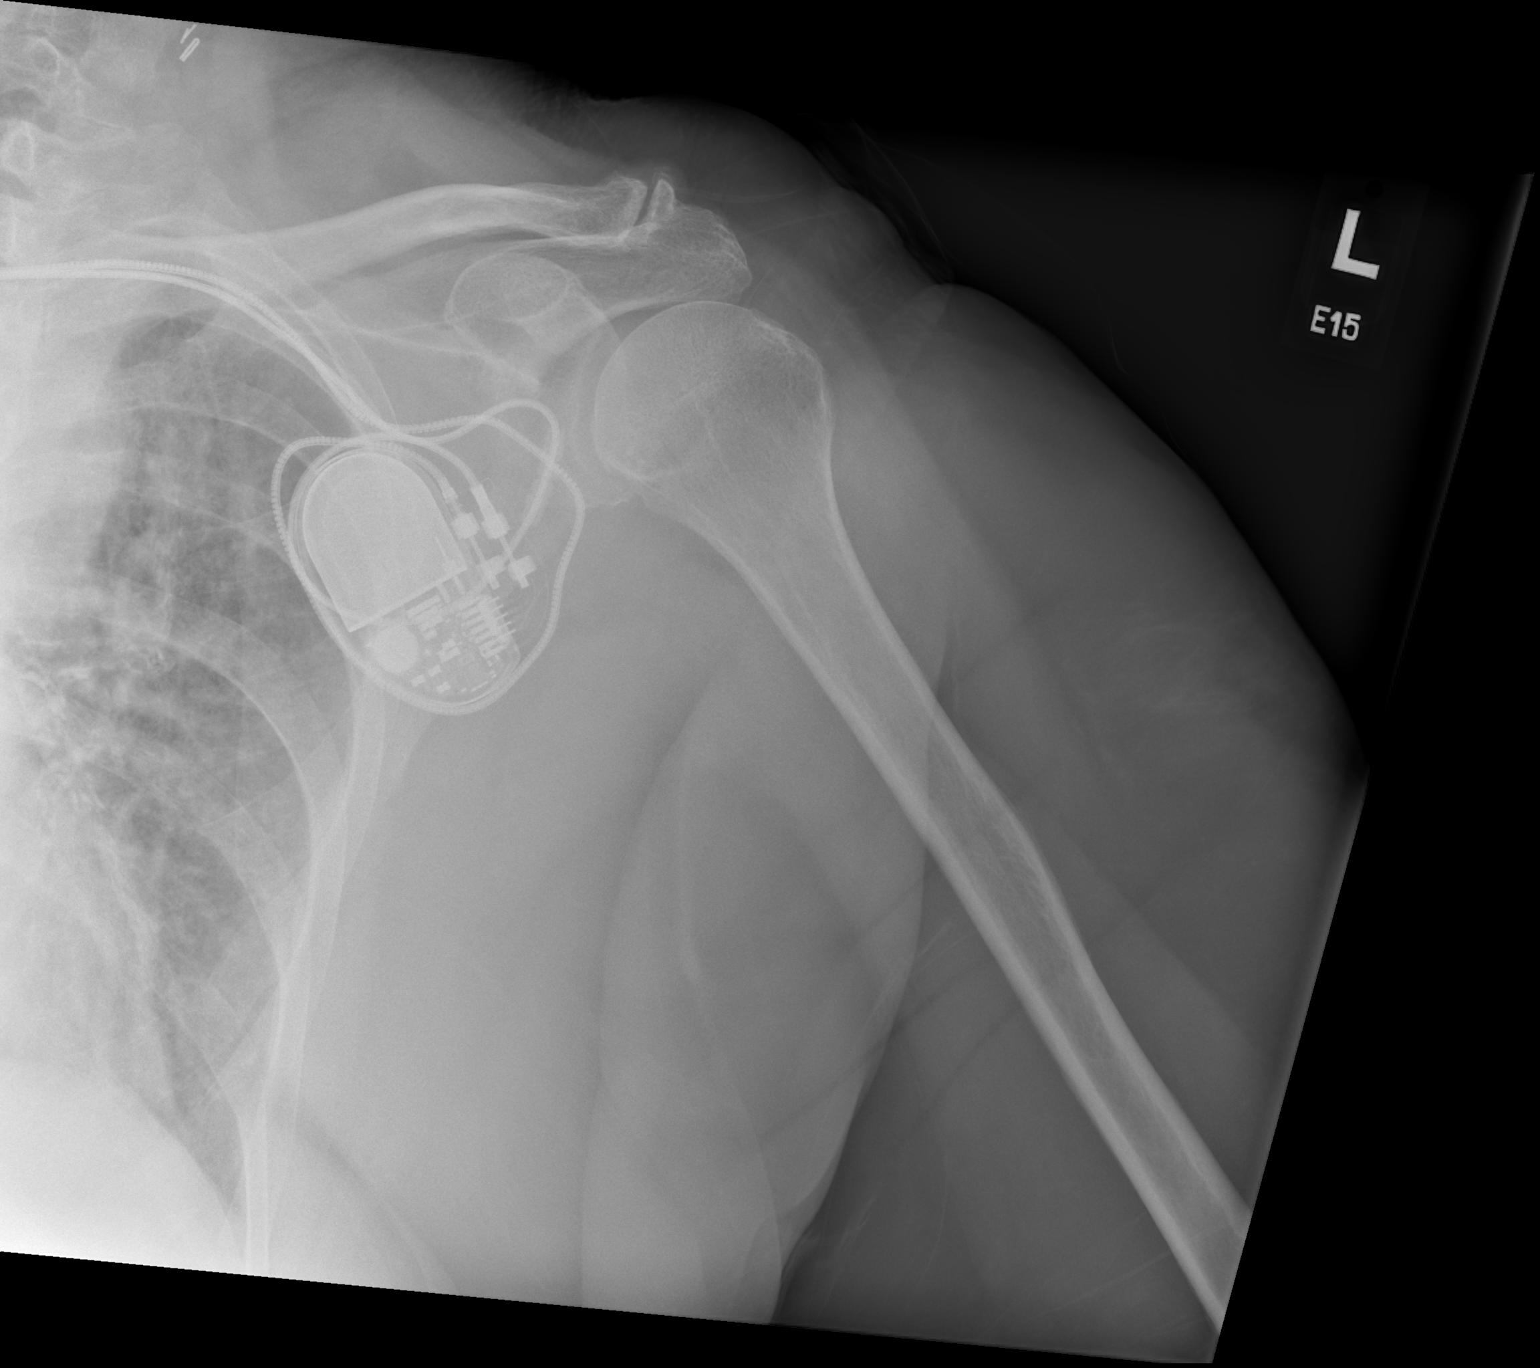

[t shoulder y-view left]
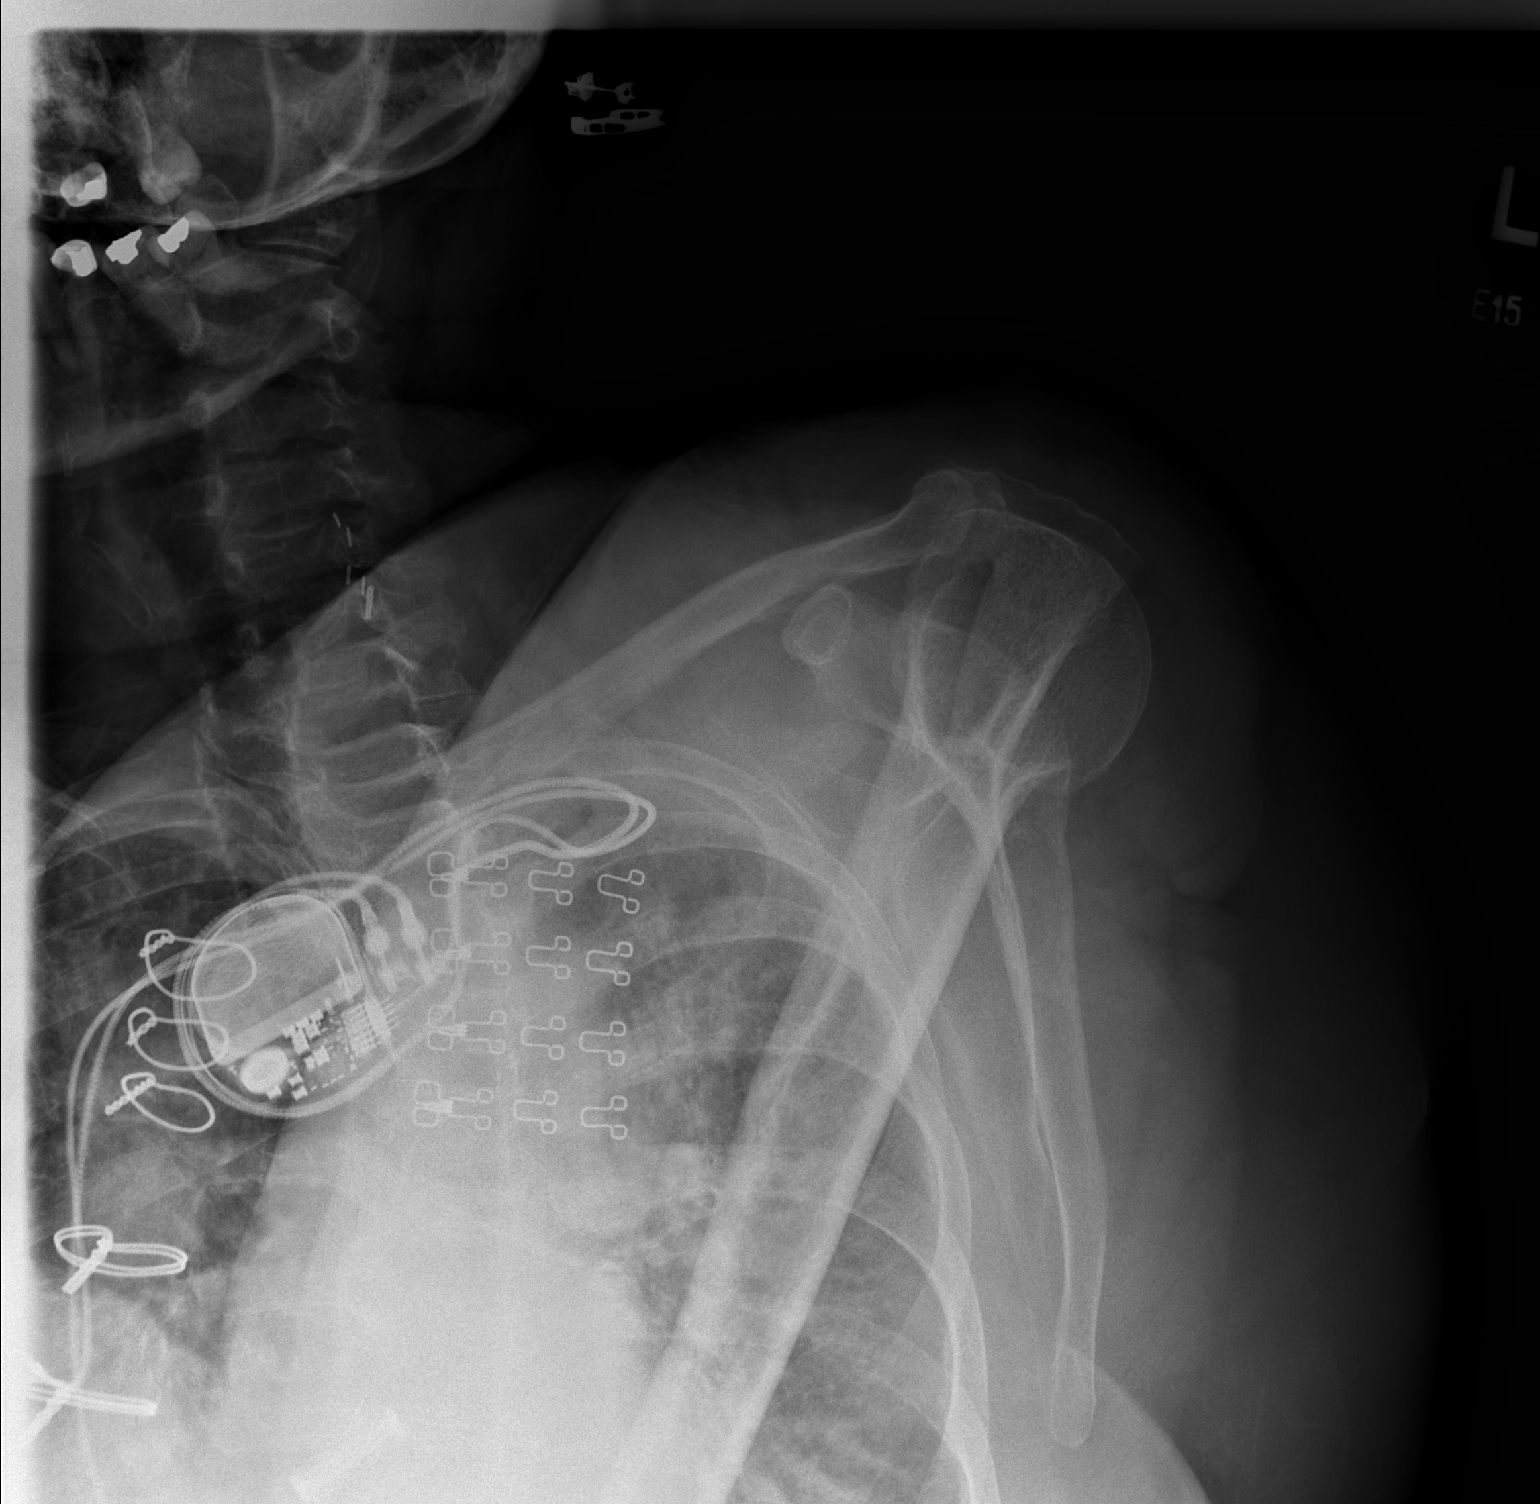

[2 of 2 positions shown; findings below may reference images not displayed]

FINDINGS: There is no evidence of acute fracture or dislocation. There is mild
glenohumeral and moderate AC joint arthritis. Partially visualized
pacemaker generator and leads overlying the left chest.
IMPRESSION: No acute fracture or dislocation of the left shoulder.

## 2021-09-11 IMAGING — CR DG KNEE COMPLETE 4+V*L*
4 series · 4 of 4 positions shown · non-contrast
Comparison: None.

CLINICAL DATA: Fall

EXAM:
LEFT KNEE - COMPLETE 4+ VIEW

[t knee obl left (1 of 2)]
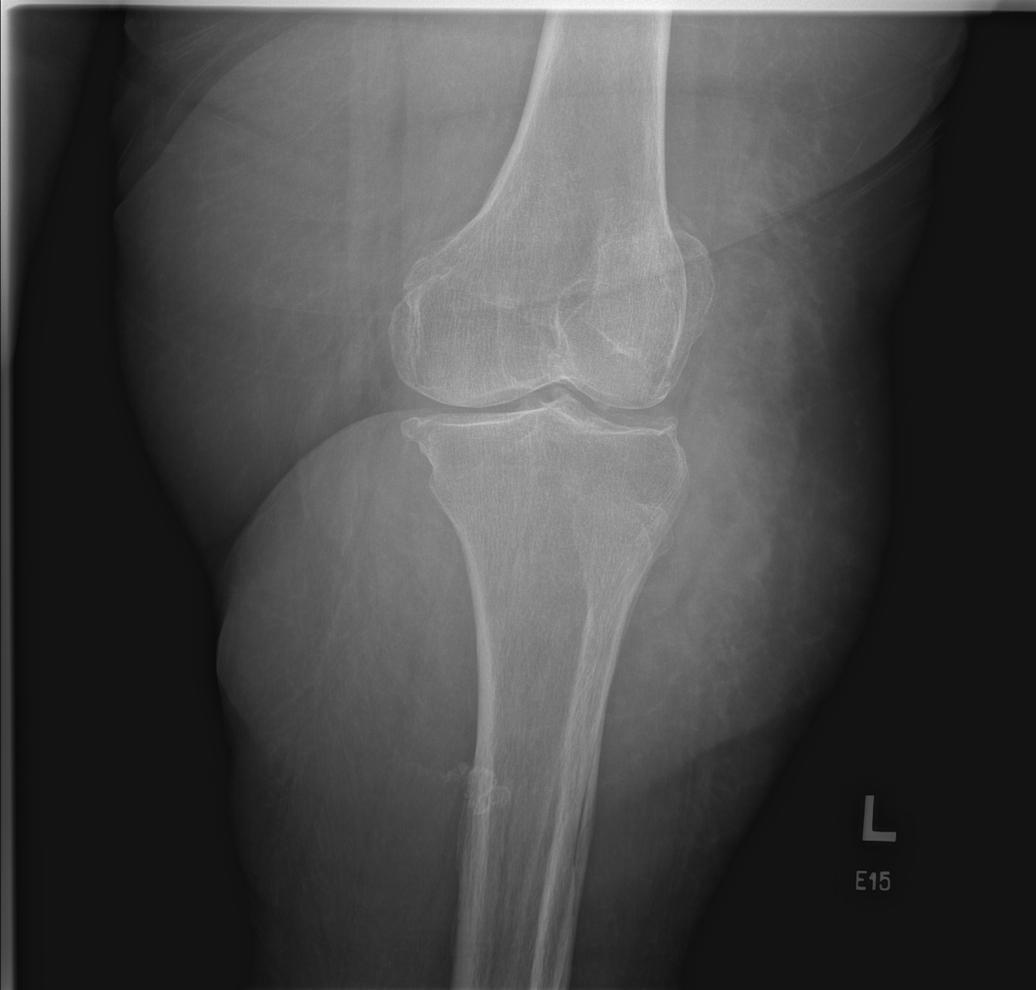

[t knee ap left]
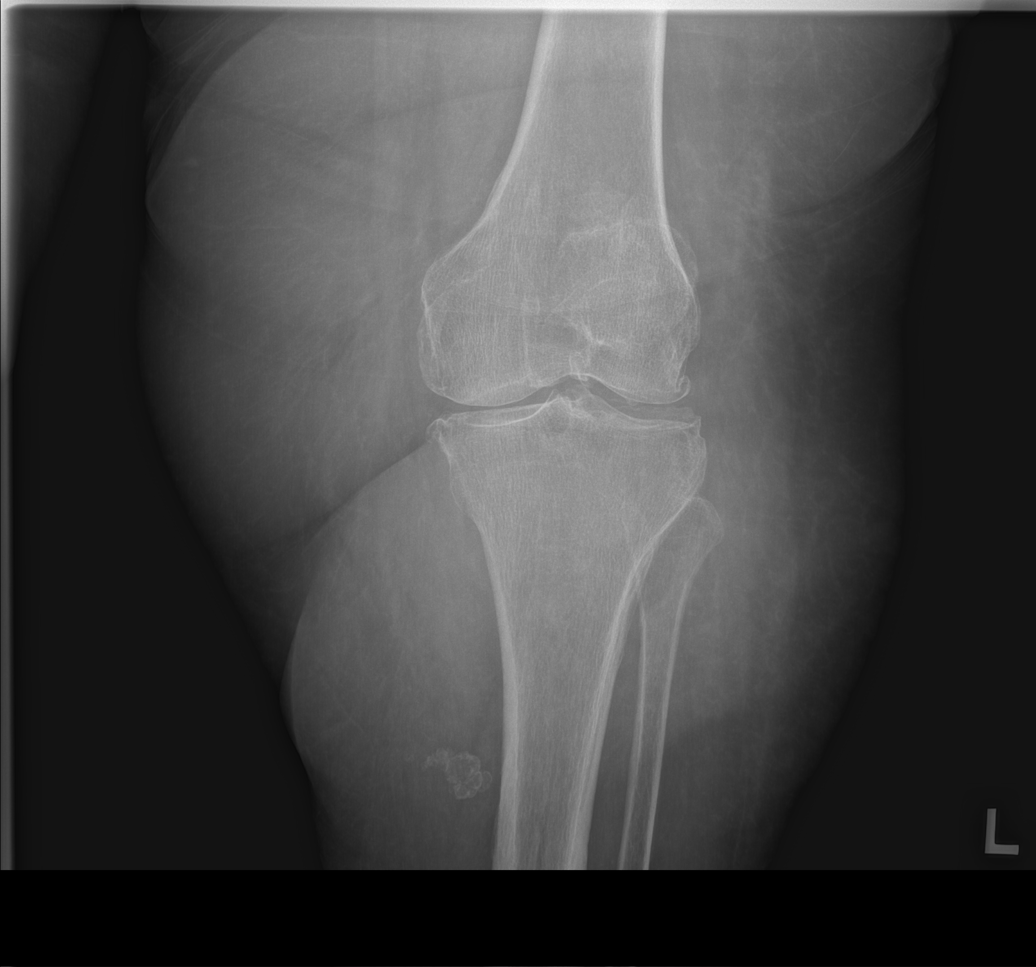

[t knee obl left (2 of 2)]
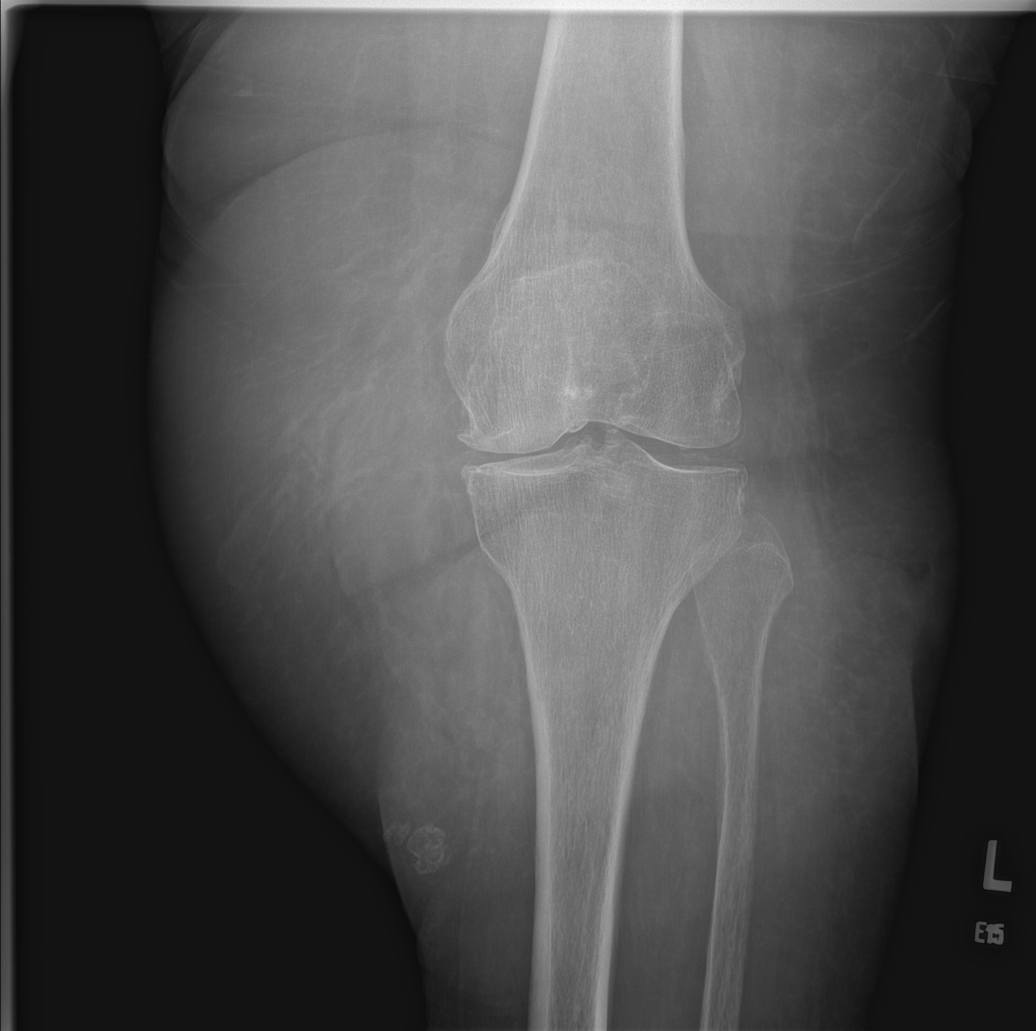

[x knee lat left]
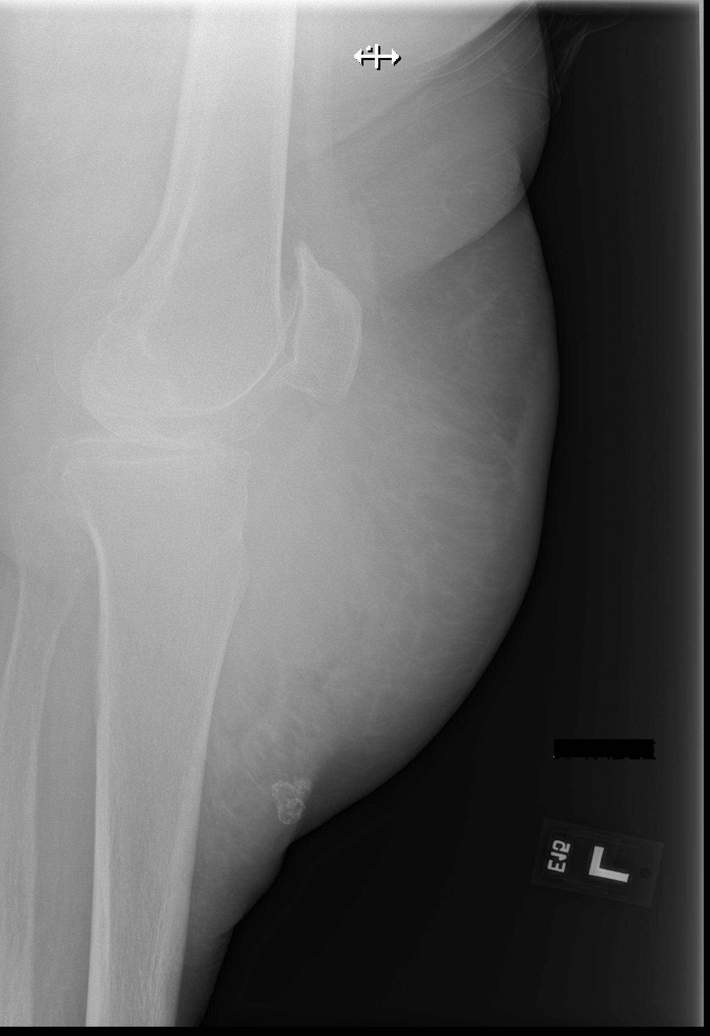

[4 of 4 positions shown; findings below may reference images not displayed]

FINDINGS: There is no evidence of acute fracture. There is tricompartment
osteoarthritis with mild medial joint space narrowing and
chondrocalcinosis. There is no visible joint effusion. There is
benign-appearing soft tissue calcification anterior to the proximal
tibia, with central lucency.
IMPRESSION: No evidence of acute fracture.

Tricompartment osteoarthritis with chondrocalcinosis, worst in the
medial compartment.

## 2021-09-14 ENCOUNTER — Ambulatory Visit (INDEPENDENT_AMBULATORY_CARE_PROVIDER_SITE_OTHER): Payer: Medicare Other | Admitting: Cardiology

## 2021-09-14 DIAGNOSIS — Z5181 Encounter for therapeutic drug level monitoring: Secondary | ICD-10-CM

## 2021-09-14 DIAGNOSIS — Z952 Presence of prosthetic heart valve: Secondary | ICD-10-CM

## 2021-09-14 LAB — POCT INR: INR: 2.3 (ref 2.0–3.0)

## 2021-09-25 ENCOUNTER — Other Ambulatory Visit: Payer: Self-pay | Admitting: Cardiovascular Disease

## 2021-09-25 DIAGNOSIS — I480 Paroxysmal atrial fibrillation: Secondary | ICD-10-CM

## 2021-09-26 NOTE — Progress Notes (Signed)
Electrophysiology Office Note Date: 10/03/2021  ID:  [name], [name] [birth year], MRN [id number]  PCP: [name], MD Primary Cardiologist: Quay Burow, MD Electrophysiologist: Thompson Grayer, MD   CC: Pacemaker follow-up  [name] is a 71 y.o. female seen today for Thompson Grayer, MD for routine electrophysiology followup.  Since last being seen in our clinic the patient reports doing well from a cardiac perspective.  she denies chest pain, palpitations, dyspnea, PND, orthopnea, nausea, vomiting, dizziness, syncope, edema, weight gain, or early satiety.  Device History: StMudlogger PPM implanted 2015 for CHB  Past Medical History:  Diagnosis Date   Antiphospholipid antibody positive 06/28/2017   Asthma    CAD (coronary artery disease)    a. normal cors by cath in 03/2015   Carotid artery disease ([place])    Chronic anticoagulation    Complete heart block ([place]) 02/2013   a. Implantation of a dual chamber pacemaker in 02/2013 by Dr Rayann Heman. STJ model number 2979 pacemaker with model number 2088 right ventricular lead.    Dyslipidemia    H/O Doppler ultrasound 10/03/2010   carotid duplex - R ICA normal patency; L CCA-ICA bypass gradt patent common to interal cartoid bypass graft    H/O mechanical aortic valve replacement    History of echocardiogram 11/08/2011   EF >55%; mild-mod concentric LVH; trace aortic regurg.; LA mild-mod dilated   History of nuclear stress test 01/20/2010   normal pattern of perfusion; low risk scan   Hypertension    Lupus (systemic lupus erythematosus) (HCC)    PVD (peripheral vascular disease) (HCC)    60% R renal artery stenosis; non-functioning L kidney   Past Surgical History:  Procedure Laterality Date   AORTIC VALVE REPLACEMENT  11/29/006   [place]; St. Jude; on coumadin   CARDIAC CATHETERIZATION  11/01/2004   define anatomy    CARDIAC CATHETERIZATION N/A 04/19/2015   Procedure: Coronary Angiography;  Surgeon: Peter M [name],  MD;  Location: [facility] CV [facility];  Service: Cardiovascular;  Laterality: N/A;   CAROTID ENDARTERECTOMY     lt.   CHOLECYSTECTOMY  1999   EXPLORATORY LAPAROTOMY  04/12/2012   small bowel perf; ileostomy & R hemicolectomy    PACEMAKER INSERTION  03/06/2013   SJM Assurity DR PPM implanted by Dr Rayann Heman for complete heart block   PERMANENT PACEMAKER INSERTION N/A 03/06/2013   Procedure: PERMANENT PACEMAKER INSERTION;  Surgeon: Coralyn Mark, MD;  Location: [place] CATH LAB;  Service: Cardiovascular;  Laterality: N/A;   TEMPORARY PACEMAKER INSERTION N/A 03/06/2013   Procedure: TEMPORARY PACEMAKER INSERTION;  Surgeon: Coralyn Mark, MD;  Location: [place] CATH LAB;  Service: Cardiovascular;  Laterality: N/A;   TUBAL LIGATION  11/1982    Current Outpatient Medications  Medication Sig Dispense Refill   acetaminophen (TYLENOL) 325 MG tablet Take 650 mg by mouth every 6 (six) hours as needed for moderate pain.      albuterol (PROVENTIL HFA;VENTOLIN HFA) 108 (90 Base) MCG/ACT inhaler Inhale 2 puffs into the lungs every 6 (six) hours as needed for wheezing or shortness of breath. 3 Inhaler 1   allopurinol (ZYLOPRIM) 100 MG tablet Take 100 mg by mouth daily.      calcium carbonate (OSCAL) 1500 (600 Ca) MG TABS tablet Take 1 tablet by mouth 2 (two) times daily.     cetirizine (ZYRTEC) 10 MG tablet Take 10 mg by mouth at bedtime.     chlorhexidine (PERIDEX) 0.12 % solution Use as directed 10 mLs  in the mouth or throat 2 (two) times daily as needed (before dental appts).   5   cholecalciferol (VITAMIN D) 1000 UNITS tablet Take 2,000 Units by mouth 2 (two) times daily.      clotrimazole-betamethasone (LOTRISONE) cream as needed.     dicyclomine (BENTYL) 20 MG tablet Take by mouth.     dofetilide (TIKOSYN) 250 MCG capsule Take 1 capsule (250 mcg total) by mouth 2 (two) times daily. 180 capsule 1   Ferrous Fumarate-Folic Acid (HEMOCYTE-F) 324-1 MG TABS Take 1 tablet by mouth daily.     fluticasone-salmeterol (WIXELA  INHUB) 250-50 MCG/ACT AEPB USE 1 INHALATION BY MOUTH  twice daily 180 each 3   furosemide (LASIX) 40 MG tablet TAKE 1 TABLET BY MOUTH DAILY 90 tablet 3   furosemide (LASIX) 40 MG tablet Take 1 tablet (40 mg total) by mouth daily. 90 tablet 1   hydroxychloroquine (PLAQUENIL) 200 MG tablet Take 200 mg by mouth daily.     isosorbide mononitrate (IMDUR) 30 MG 24 hr tablet TAKE 1 TABLET BY MOUTH  DAILY 90 tablet 1   leflunomide (ARAVA) 10 MG tablet Take 10 mg by mouth daily.     loratadine (CLARITIN) 10 MG tablet Take 10 mg by mouth daily.     Magnesium 200 MG TABS Take 1 tablet (200 mg total) by mouth daily. 30 each    metoprolol succinate (TOPROL-XL) 50 MG 24 hr tablet TAKE 1 TABLET BY MOUTH  DAILY WITH OR IMMEDIATELY  FOLLOWING A MEAL 90 tablet 1   predniSONE (DELTASONE) 5 MG tablet Take 5 mg by mouth daily.     Probiotic Product (PROBIOTIC DAILY PO) Take 1 tablet by mouth daily.     Saccharomyces boulardii (PROBIOTIC) 250 MG CAPS 1 capsule     simvastatin (ZOCOR) 20 MG tablet Take 1 tablet by mouth at  bedtime 90 tablet 0   sodium zirconium cyclosilicate (LOKELMA) 10 g PACK packet Take 10 g by mouth 2 (two) times daily. 4 packet 0   warfarin (COUMADIN) 5 MG tablet TAKE 1 TO 1 AND 1/2 TABLETS BY MOUTH DAILY AS DIRECTED  BY COUMADIN CLINIC 150 tablet 1   losartan (COZAAR) 25 MG tablet Take 1 tablet (25 mg total) by mouth daily. 90 tablet 3   No current facility-administered medications for this visit.    Allergies:   Spironolactone   Social History: Social History   Socioeconomic History   Marital status: Divorced    Spouse name: Not on file   Number of children: 3   Years of education: Not on file   Highest education level: Not on file  Occupational History   Occupation: Retired  Tobacco Use   Smoking status: Never   Smokeless tobacco: Never  Vaping Use   Vaping Use: Never used  Substance and Sexual Activity   Alcohol use: No   Drug use: No   Sexual activity: Not on file   Other Topics Concern   Not on file  Social History Narrative   Lives alone in [place].   Social Determinants of Health   Financial Resource Strain: Not on file  Food Insecurity: Not on file  Transportation Needs: Not on file  Physical Activity: Not on file  Stress: Not on file  Social Connections: Not on file  Intimate Partner Violence: Not on file    Family History: Family History  Problem Relation Age of Onset   Alzheimer's disease Father    Cirrhosis Father    Cancer Father  prostate   Heart attack Neg Hx      Review of Systems: All other systems reviewed and are otherwise negative except as noted above.  Physical Exam: Vitals:   10/03/21 1156  BP: 124/78  Pulse: 76  SpO2: 98%  Weight: 245 lb 9.6 oz (111.4 kg)  Height: 5\' 5"  (1.651 m)     GEN- The patient is well appearing, alert and oriented x 3 today.   HEENT: normocephalic, atraumatic; sclera clear, conjunctiva pink; hearing intact; oropharynx clear; neck supple  Lungs- Clear to ausculation bilaterally, normal work of breathing.  No wheezes, rales, rhonchi Heart- Regular rate and rhythm, no murmurs, rubs or gallops  GI- soft, non-tender, non-distended, bowel sounds present  Extremities- no clubbing or cyanosis. No edema MS- no significant deformity or atrophy Skin- warm and dry, no rash or lesion; PPM pocket well healed Psych- euthymic mood, full affect Neuro- strength and sensation are intact  PPM Interrogation- reviewed in detail today,  See PACEART report  EKG:  EKG is not ordered today.  Recent Labs: 12/09/2020: Magnesium 2.1 01/16/2021: BUN 27; Creatinine, Ser 1.40; Potassium 4.4; Sodium 142   Wt Readings from Last 3 Encounters:  10/03/21 245 lb 9.6 oz (111.4 kg)  07/12/21 240 lb 9.6 oz (109.1 kg)  03/03/21 240 lb 3.2 oz (109 kg)     Other studies Reviewed: Additional studies/ records that were reviewed today include: Previous EP office notes, Previous remote checks, Most  recent labwork.   Assessment and Plan:  1. CHB s/p St. Jude PPM  Normal PPM function See Pace Art report No changes today  2. Persistent AF EKG today shows NSR, low burden by device.  Continue tikosyn. Labs today  3. HTN Stable on current regimen   4. S/p mechanical AVR Followed by gen cards. Recent echo stable (02/2021  5. Chronic combined CHF 6. NICM Echo 02/2021 showed LVEF 50-55%  7. Obesity Body mass index is 40.87 kg/m.   Current medicines are reviewed at length with the patient today.    Labs/ tests ordered today include:  Orders Placed This Encounter  Procedures   CUP [place]   EKG 12-Lead     Disposition:   Follow up with Dr. Curt Bears in 12 months to establish from Dr. Rayann Heman   Signed, Shirley Friar, PA-C  10/03/2021 12:12 PM  [address] [place] [address] [phone] (office) [phone] (fax)

## 2021-09-28 ENCOUNTER — Telehealth: Payer: Self-pay

## 2021-09-28 ENCOUNTER — Ambulatory Visit (INDEPENDENT_AMBULATORY_CARE_PROVIDER_SITE_OTHER): Payer: Medicare Other | Admitting: *Deleted

## 2021-09-28 DIAGNOSIS — Z5181 Encounter for therapeutic drug level monitoring: Secondary | ICD-10-CM | POA: Diagnosis not present

## 2021-09-28 DIAGNOSIS — Z952 Presence of prosthetic heart valve: Secondary | ICD-10-CM | POA: Diagnosis not present

## 2021-09-28 LAB — POCT INR: INR: 2.1 (ref 2.0–3.0)

## 2021-09-28 NOTE — Patient Instructions (Signed)
Description   SELF TESTER -Spoke with pt and instructed pt to TAKE 1.5 TABLETS TODAY and then  START taking 1.5 tablets daily EXCEPT 1 tablet on Tuesday, Thursday, and Saturday.  Recheck INR in 2 weeks-SELF TESTER.  Coumadin Clinic [phone]

## 2021-09-28 NOTE — Telephone Encounter (Signed)
Lpm to check INR 

## 2021-10-03 ENCOUNTER — Ambulatory Visit: Payer: Medicare Other | Admitting: Student

## 2021-10-03 ENCOUNTER — Encounter: Payer: Self-pay | Admitting: Student

## 2021-10-03 VITALS — BP 124/78 | HR 76 | Ht 65.0 in | Wt 245.6 lb

## 2021-10-03 DIAGNOSIS — I442 Atrioventricular block, complete: Secondary | ICD-10-CM

## 2021-10-03 DIAGNOSIS — Z952 Presence of prosthetic heart valve: Secondary | ICD-10-CM | POA: Diagnosis not present

## 2021-10-03 DIAGNOSIS — Z5181 Encounter for therapeutic drug level monitoring: Secondary | ICD-10-CM

## 2021-10-03 DIAGNOSIS — I5189 Other ill-defined heart diseases: Secondary | ICD-10-CM

## 2021-10-03 DIAGNOSIS — I428 Other cardiomyopathies: Secondary | ICD-10-CM

## 2021-10-03 LAB — CUP PACEART INCLINIC DEVICE CHECK
Battery Remaining Longevity: 20 mo
Battery Voltage: 2.87 V
Brady Statistic RA Percent Paced: 30 %
Brady Statistic RV Percent Paced: 99.76 %
Date Time Interrogation Session: 20230815124355
Implantable Lead Implant Date: 20150116
Implantable Lead Implant Date: 20150116
Implantable Lead Location: 753859
Implantable Lead Location: 753860
Implantable Lead Model: 5076
Implantable Pulse Generator Implant Date: 20150116
Lead Channel Impedance Value: 375 Ohm
Lead Channel Impedance Value: 575 Ohm
Lead Channel Pacing Threshold Amplitude: 0.5 V
Lead Channel Pacing Threshold Amplitude: 0.5 V
Lead Channel Pacing Threshold Amplitude: 0.75 V
Lead Channel Pacing Threshold Amplitude: 0.75 V
Lead Channel Pacing Threshold Pulse Width: 0.4 ms
Lead Channel Pacing Threshold Pulse Width: 0.4 ms
Lead Channel Pacing Threshold Pulse Width: 0.4 ms
Lead Channel Pacing Threshold Pulse Width: 0.4 ms
Lead Channel Sensing Intrinsic Amplitude: 4.7 mV
Lead Channel Setting Pacing Amplitude: 2 V
Lead Channel Setting Pacing Amplitude: 2.5 V
Lead Channel Setting Pacing Pulse Width: 0.4 ms
Lead Channel Setting Sensing Sensitivity: 2 mV
Pulse Gen Model: 2240
Pulse Gen Serial Number: 7587004

## 2021-10-03 NOTE — Patient Instructions (Signed)
Medication Instructions:  Your physician recommends that you continue on your current medications as directed. Please refer to the Current Medication list given to you today.  *If you need a refill on your cardiac medications before your next appointment, please call your pharmacy*   Lab Work: None If you have labs (blood work) drawn today and your tests are completely normal, you will receive your results only by: MyChart Message (if you have MyChart) OR A paper copy in the mail If you have any lab test that is abnormal or we need to change your treatment, we will call you to review the results.  Follow-Up: At [facility], you and your health needs are our priority.  As part of our continuing mission to provide you with exceptional heart care, we have created designated Provider Care Teams.  These Care Teams include your primary Cardiologist (physician) and Advanced Practice Providers (APPs -  Physician Assistants and Nurse Practitioners) who all work together to provide you with the care you need, when you need it.  We recommend signing up for the patient portal called "MyChart".  Sign up information is provided on this After Visit Summary.  MyChart is used to connect with patients for Virtual Visits (Telemedicine).  Patients are able to view lab/test results, encounter notes, upcoming appointments, etc.  Non-urgent messages can be sent to your provider as well.   To learn more about what you can do with MyChart, go to [email or web address].    Your next appointment:   1 year(s)  The format for your next appointment:   In Person  Provider:   Will Camnitz, MD{   

## 2021-10-11 ENCOUNTER — Ambulatory Visit (INDEPENDENT_AMBULATORY_CARE_PROVIDER_SITE_OTHER): Payer: Medicare Other

## 2021-10-11 DIAGNOSIS — I442 Atrioventricular block, complete: Secondary | ICD-10-CM | POA: Diagnosis not present

## 2021-10-12 ENCOUNTER — Ambulatory Visit (INDEPENDENT_AMBULATORY_CARE_PROVIDER_SITE_OTHER): Payer: Medicare Other

## 2021-10-12 DIAGNOSIS — Z5181 Encounter for therapeutic drug level monitoring: Secondary | ICD-10-CM | POA: Diagnosis not present

## 2021-10-12 DIAGNOSIS — Z952 Presence of prosthetic heart valve: Secondary | ICD-10-CM | POA: Diagnosis not present

## 2021-10-12 LAB — CUP PACEART REMOTE DEVICE CHECK
Battery Remaining Longevity: 19 mo
Battery Remaining Percentage: 18 %
Battery Voltage: 2.87 V
Brady Statistic AP VP Percent: 64 %
Brady Statistic AP VS Percent: 1 %
Brady Statistic AS VP Percent: 36 %
Brady Statistic AS VS Percent: 1 %
Brady Statistic RA Percent Paced: 61 %
Brady Statistic RV Percent Paced: 99 %
Date Time Interrogation Session: 20230823043840
Implantable Lead Implant Date: 20150116
Implantable Lead Implant Date: 20150116
Implantable Lead Location: 753859
Implantable Lead Location: 753860
Implantable Lead Model: 5076
Implantable Pulse Generator Implant Date: 20150116
Lead Channel Impedance Value: 380 Ohm
Lead Channel Impedance Value: 590 Ohm
Lead Channel Pacing Threshold Amplitude: 0.5 V
Lead Channel Pacing Threshold Amplitude: 0.75 V
Lead Channel Pacing Threshold Pulse Width: 0.4 ms
Lead Channel Pacing Threshold Pulse Width: 0.4 ms
Lead Channel Sensing Intrinsic Amplitude: 5 mV
Lead Channel Sensing Intrinsic Amplitude: 7.8 mV
Lead Channel Setting Pacing Amplitude: 2 V
Lead Channel Setting Pacing Amplitude: 2.5 V
Lead Channel Setting Pacing Pulse Width: 0.4 ms
Lead Channel Setting Sensing Sensitivity: 2 mV
Pulse Gen Model: 2240
Pulse Gen Serial Number: 7587004

## 2021-10-12 LAB — POCT INR: INR: 3.3 — AB (ref 2.0–3.0)

## 2021-10-12 NOTE — Patient Instructions (Signed)
Description   SELF TESTER -Spoke with pt and instructed to continue taking 1.5 tablets daily EXCEPT 1 tablet on Tuesday, Thursday, and Saturday.  Recheck INR in 2 weeks Coumadin Clinic [phone]

## 2021-10-17 NOTE — Progress Notes (Unsigned)
Cardiology Clinic Note   Patient Name: [name] Date of Encounter: 10/19/2021  Primary Care Provider:  [name], MD Primary Cardiologist:  Quay Burow, MD  Patient Profile    [name] 71 year old female presents to the clinic today for follow-up evaluation of her acute on chronic combined systolic and diastolic CHF.  Past Medical History    Past Medical History:  Diagnosis Date   Antiphospholipid antibody positive 06/28/2017   Asthma    CAD (coronary artery disease)    a. normal cors by cath in 03/2015   Carotid artery disease ([place])    Chronic anticoagulation    Complete heart block ([place]) 02/2013   a. Implantation of a dual chamber pacemaker in 02/2013 by Dr Rayann Heman. STJ model number 6546 pacemaker with model number 2088 right ventricular lead.    Dyslipidemia    H/O Doppler ultrasound 10/03/2010   carotid duplex - R ICA normal patency; L CCA-ICA bypass gradt patent common to interal cartoid bypass graft    H/O mechanical aortic valve replacement    History of echocardiogram 11/08/2011   EF >55%; mild-mod concentric LVH; trace aortic regurg.; LA mild-mod dilated   History of nuclear stress test 01/20/2010   normal pattern of perfusion; low risk scan   Hypertension    Lupus (systemic lupus erythematosus) (HCC)    PVD (peripheral vascular disease) (HCC)    60% R renal artery stenosis; non-functioning L kidney   Past Surgical History:  Procedure Laterality Date   AORTIC VALVE REPLACEMENT  11/29/006   [place]; [employer]; on coumadin   CARDIAC CATHETERIZATION  11/01/2004   define anatomy    CARDIAC CATHETERIZATION N/A 04/19/2015   Procedure: Coronary Angiography;  Surgeon: Peter M [name], MD;  Location: [facility];  Service: Cardiovascular;  Laterality: N/A;   CAROTID ENDARTERECTOMY     lt.   CHOLECYSTECTOMY  1999   EXPLORATORY LAPAROTOMY  04/12/2012   small bowel perf; ileostomy & R hemicolectomy    PACEMAKER INSERTION  03/06/2013   SJM [employer] DR  PPM implanted by Dr Rayann Heman for complete heart block   PERMANENT PACEMAKER INSERTION N/A 03/06/2013   Procedure: PERMANENT PACEMAKER INSERTION;  Surgeon: Coralyn Mark, MD;  Location: [facility];  Service: Cardiovascular;  Laterality: N/A;   TEMPORARY PACEMAKER INSERTION N/A 03/06/2013   Procedure: TEMPORARY PACEMAKER INSERTION;  Surgeon: Coralyn Mark, MD;  Location: [facility];  Service: Cardiovascular;  Laterality: N/A;   TUBAL LIGATION  11/1982    Allergies  Allergies  Allergen Reactions   Spironolactone Other (See Comments)    Hyperkalemia    History of Present Illness    [name] has a PMH of atrial fibrillation with mechanical AVR on warfarin, hypertension, chronic combined systolic and diastolic CHF, and renal insufficiency.  Her echocardiogram 12/20/2020 showed an LVEF of 30-35%.  She was referred to pharmacy by Dr. Rayann Heman for management of her heart failure medication.  She was initiated on Entresto 24-26 and repeat labs showed hyperkalemia.  Her lab work from 01/09/2021 showed a potassium of 5.4.  She was given 2 days of Lokelma and her follow-up lab work on 01/16/2021 showed a potassium of 4.4.  During her follow-up visit with pharmacy 12/30/2020 she shortness of breath with increased physical activity.  She denied chest pain and palpitations.  She was able to complete all of her ADLs.  She had not been checking her blood pressure at home because she felt her blood pressure cuff  was not accurate.  She was noted to have bilateral lower extremity swelling was compliant with her furosemide.  She had been weighing herself daily and reported that it was stable.  Of note she was also unable to tolerate spironolactone in 2018 due to hyperkalemia.  She presented to the clinic 01/24/21 for follow-up evaluation stated she felt well.  She was also seeing nephrology. We reviewed her prior medications and her increased potassium.  She did not tolerate Entresto.  We started  her on losartan  25 mg daily.  I gave her the [employer] support stocking sheet and a salty 6 diet sheet.  We planned follow-up for 1 month with me or pharmacy for up titration of her losartan.  She presented to the clinic 03/03/21 for follow-up evaluation stated she felt well .  She did have a cough over the Christmas holidays.  She felt that she picked up a virus from her granddaughter who was staying with her at the time.  She felt she had recovered well.  Her furosemide was being dosed by nephrology.  She was taking 40 mg daily.  She brought in her BMP  that showed a creatinine of 1.61.  I continued her current dosing of her losartan and planned repeat  echocardiogram.  She reported that she had not been eating out and was avoiding salt.  Her diet made a significant difference in her weight and lower extremity swelling.  She was euvolemic .  And her weight was down another 2 pounds.    We planned her follow-up in 4 months.  She was seen by Dr. Gwenlyn Found on 07/12/2021.  She continued to be followed by the A-fib clinic.  Her sinus rhythm has maintained with Tikosyn therapy.  She was compliant with Coumadin therapy.  She continued to do well.  She denied chest pain and shortness of breath.  Her echocardiogram showed normal LV systolic function with G2 DD and well-functioning mechanical aortic valve which had been placed on 03/10/2021.  She was trialed on spironolactone however it was discontinued due to hyperkalemia.  Her Delene Loll was also transitioned to losartan.  She presents to the clinic today for follow-up evaluation states she noticed some blood in the toilet 2 to 3 weeks ago for 1 occurrence.  Then 2 to 3 weeks later she noticed another recurrence of blood in her toilet.  She has had no further occurrences.  She reports that she has occasional dietary indiscretion but tries to be very strict with her low-salt diet.  Over the last week she noted some increased shortness of breath however in the last 2 days her breathing has  been much better.  She has been staying active helping care for her granddaughter who has started middle school.  She does report that over the last 2 to 3 months she has not been walking as much.  I encouraged her to continue her heart healthy low-sodium diet, increase her physical activity as tolerated and we will plan follow-up in 6 months.  Today she denies chest pain, shortness of breath, lower extremity edema, fatigue, palpitations, melena, hematuria, hemoptysis, diaphoresis, weakness, presyncope, syncope, orthopnea, and PND.     Home Medications    Prior to Admission medications   Medication Sig Start Date End Date Taking? Authorizing Provider  acetaminophen (TYLENOL) 325 MG tablet Take 650 mg by mouth every 6 (six) hours as needed for moderate pain.     [provider]  albuterol (PROVENTIL HFA;VENTOLIN HFA) 108 (90 Base) MCG/ACT  inhaler Inhale 2 puffs into the lungs every 6 (six) hours as needed for wheezing or shortness of breath. 06/10/17   Baird Lyons D, MD  allopurinol (ZYLOPRIM) 100 MG tablet Take 100 mg by mouth daily.     [provider]  calcium carbonate (OSCAL) 1500 (600 Ca) MG TABS tablet Take 1 tablet by mouth 2 (two) times daily.    [provider]  cetirizine (ZYRTEC) 10 MG tablet Take 10 mg by mouth at bedtime.    [provider]  chlorhexidine (PERIDEX) 0.12 % solution Use as directed 10 mLs in the mouth or throat 2 (two) times daily as needed (before dental appts).  03/31/15   [provider]  cholecalciferol (VITAMIN D) 1000 UNITS tablet Take 2,000 Units by mouth 2 (two) times daily.     [provider]  clotrimazole-betamethasone (LOTRISONE) cream as needed. 11/04/18   [provider]  colestipol (COLESTID) 1 g tablet Take 1 g by mouth daily.    [provider]  dicyclomine (BENTYL) 20 MG tablet Take by mouth. 07/15/19   [provider]  dofetilide (TIKOSYN) 250 MCG capsule Take 1 capsule  by mouth twice daily 07/22/20   Fenton, Clint R, PA  Ferrous Fumarate-Folic Acid (HEMOCYTE-F) 324-1 MG TABS Take 1 tablet by mouth daily. 09/20/20   [provider]  fluticasone-salmeterol Grant Ruts INHUB) 250-50 MCG/ACT AEPB USE 1 INHALATION BY MOUTH  twice daily 11/25/20   Baird Lyons D, MD  furosemide (LASIX) 20 MG tablet Take 1 tablet (20 mg total) by mouth daily. 10/19/20   [name], MD  hydroxychloroquine (PLAQUENIL) 200 MG tablet Take 200 mg by mouth daily.    [provider]  isosorbide mononitrate (IMDUR) 30 MG 24 hr tablet TAKE 1 TABLET BY MOUTH  DAILY 11/22/20   [name], MD  leflunomide (ARAVA) 10 MG tablet Take 10 mg by mouth daily.    [provider]  loratadine (CLARITIN) 10 MG tablet Take 10 mg by mouth daily.    [provider]  Magnesium 200 MG TABS Take 1 tablet (200 mg total) by mouth daily. 04/15/18   Sherran Needs, NP  metoprolol succinate (TOPROL-XL) 50 MG 24 hr tablet TAKE 1 TABLET BY MOUTH  DAILY WITH OR IMMEDIATELY  FOLLOWING A MEAL 11/22/20   [name], MD  potassium chloride SA (KLOR-CON) 20 MEQ tablet TAKE 1  BY MOUTH TWICE DAILY 07/06/20   Sherran Needs, NP  predniSONE (DELTASONE) 5 MG tablet Take 5 mg by mouth daily.    [provider]  Probiotic Product (PROBIOTIC DAILY PO) Take 1 tablet by mouth daily.    [provider]  Saccharomyces boulardii (PROBIOTIC) 250 MG CAPS 1 capsule    [provider]  simvastatin (ZOCOR) 20 MG tablet Take 1 tablet by mouth at  bedtime 06/07/15   [name], MD  sodium zirconium cyclosilicate (LOKELMA) 10 g PACK packet Take 10 g by mouth 2 (two) times daily. 01/10/21   Allred, Jeneen Rinks, MD  warfarin (COUMADIN) 5 MG tablet TAKE 1 TO 1 AND 1/2 TABLETS BY MOUTH DAILY AS DIRECTED  BY COUMADIN CLINIC 08/10/20   [name], MD    Family History    Family History  Problem Relation Age of Onset   Alzheimer's disease Father    Cirrhosis Father     Cancer Father        prostate   Heart attack Neg Hx    She indicated  that her mother is alive. She indicated that her father is deceased. She indicated that her maternal grandmother is deceased. She indicated that her maternal grandfather is deceased. She indicated that her paternal grandmother is deceased. She indicated that her paternal grandfather is deceased. She indicated that the status of her neg hx is unknown.   Social History    Social History   Socioeconomic History   Marital status: Divorced    Spouse name: Not on file   Number of children: 3   Years of education: Not on file   Highest education level: Not on file  Occupational History   Occupation: Retired  Tobacco Use   Smoking status: Never   Smokeless tobacco: Never  Vaping Use   Vaping Use: Never used  Substance and Sexual Activity   Alcohol use: No   Drug use: No   Sexual activity: Not on file  Other Topics Concern   Not on file  Social History Narrative   Lives alone in [place].   Social Determinants of Health   Financial Resource Strain: Not on file  Food Insecurity: Not on file  Transportation Needs: Not on file  Physical Activity: Not on file  Stress: Not on file  Social Connections: Not on file  Intimate Partner Violence: Not on file     Review of Systems    General:  No chills, fever, night sweats or weight changes.  Cardiovascular:  No chest pain, dyspnea on exertion, edema, orthopnea, palpitations, paroxysmal nocturnal dyspnea. Dermatological: No rash, lesions/masses Respiratory: No cough, dyspnea Urologic: No hematuria, dysuria Abdominal:   No nausea, vomiting, diarrhea, bright red blood per rectum, melena, or hematemesis Neurologic:  No visual changes, wkns, changes in mental status. All other systems reviewed and are otherwise negative except as noted above.  Physical Exam    VS:  BP 120/80 (BP Location: Left Arm, Patient Position: Sitting, Cuff Size: Large)   Pulse 64    Ht 5\' 5"  (1.651 m)   Wt 249 lb (112.9 kg)   BMI 41.44 kg/m  , BMI Body mass index is 41.44 kg/m. GEN: Well nourished, well developed, in no acute distress. HEENT: normal. Neck: Supple, no JVD, carotid bruits, or masses. Cardiac: RRR, no murmurs, rubs, or gallops. No clubbing, cyanosis, +1 pitting bilateral lower extremity edema.  Radials/DP/PT 2+ and equal bilaterally.  Respiratory:  Respirations regular and unlabored, clear to auscultation bilaterally. GI: Soft, nontender, nondistended, BS + x 4. MS: no deformity or atrophy. Skin: warm and dry, no rash. Neuro:  Strength and sensation are intact. Psych: Normal affect.  Accessory Clinical Findings    Recent Labs: 12/09/2020: Magnesium 2.1 01/16/2021: BUN 27; Creatinine, Ser 1.40; Potassium 4.4; Sodium 142   Recent Lipid Panel    Component Value Date/Time   CHOL 174 12/02/2019 1125   TRIG 124 12/02/2019 1125   HDL 99 12/02/2019 1125   CHOLHDL 1.8 12/02/2019 1125   LDLCALC 54 12/02/2019 1125    ECG personally reviewed by me today-none today.  Echocardiogram 12/20/2020  IMPRESSIONS     1. Left ventricular ejection fraction, by estimation, is 30 to 35%. The  left ventricle has moderately decreased function. The left ventricle  demonstrates regional wall motion abnormalities with septal and apical  hypokinesis and septal-lateral  dyssynchrony. The left ventricular internal cavity size was mildly  dilated. There is mild left ventricular hypertrophy. Left ventricular  diastolic parameters are consistent with Grade II diastolic dysfunction  (pseudonormalization).   2. Right ventricular systolic function is mildly  reduced. The right  ventricular size is moderately enlarged. There is moderately elevated  pulmonary artery systolic pressure. The estimated right ventricular  systolic pressure is 26.9 mmHg.   3. Left atrial size was severely dilated.   4. Right atrial size was severely dilated.   5. The mitral valve is abnormal.  Moderate to severe mitral valve  regurgitation with restriction of the posterior mitral leaflet. No  evidence of mitral stenosis.   6. The tricuspid valve is abnormal. Tricuspid valve regurgitation is  severe (torrential), ? impaired valve movement due to pacemaker. Systolic  flow reversal in the hepatic vein PW doppler pattern.   7. [employer] mechanical aortic valve. Mean gradient 18 mmHg with  trivial aortic insufficiency.   8. Aortic dilatation noted. There is mild dilatation of the ascending  aorta, measuring 40 mm.   9. The inferior vena cava is dilated in size with <50% respiratory  variability, suggesting right atrial pressure of 15 mmHg.   Comparison(s): 12/31/19 EF 40-45%. PA pressure 18mmHg. AV 52mmHg mean PG,  16mmHg peak PG.  Assessment & Plan   1.  Chronic Diastolic CHF-continues to be euvolemic .  Weight today 249.2.  Did not tolerate Entresto due to hyperkalemia.  Also previously did not tolerate spironolactone due to hyperkalemia.  Echocardiogram 1/23 showed normal LVEF, G2 DD, well-functioning prosthetic aortic valve. Continue metoprolol, furosemide, losartan  Heart healthy low-sodium diet-reviewed Maintain physical activity Repeat echocardiogram 1/24  Complete heart block-status post [facility] PPM which was implanted 1/15 Follows with EP  Aortic valve stenosis-no increased DOE or activity intolerance.  Status post aortic valve replacement  AVR 2006.  Echocardiogram 1/23 reassuring with stable aortic valve prosthesis. Continue to monitor  Coronary artery disease-denies recent episodes of chest discomfort. Continue metoprolol, simvastatin Heart healthy low-sodium diet-reviewed Increase physical activity as tolerated  Hyperlipidemia-LDL 54 on 12/02/19 Continue simvastatin Heart healthy low-sodium diet-salty 6 given Increase physical activity as tolerated Follows with PCP, request labs  Carotid artery disease-carotid ultrasound 07/2021.  Right I CA minimal  wall thickening plaque ,known chronically occluded left ICA GSV bypass graft.   Repeat carotid Dopplers 6/24   Disposition: Follow-up with Dr. Gwenlyn Found or me in 6 months.  Jossie Ng. Helen Winterhalter NP-C    10/19/2021, 4:07 PM [facility] [address] Office [phone] Fax [phone]  Notice: This dictation was prepared with Dragon dictation along with smaller phrase technology. Any transcriptional errors that result from this process are unintentional and may not be corrected upon review.  I spent 15 minutes examining this patient, reviewing medications, and using patient centered shared decision making involving her cardiac care.  Prior to her visit I spent greater than 20 minutes reviewing her past medical history,  medications, and prior cardiac tests.

## 2021-10-19 ENCOUNTER — Ambulatory Visit: Payer: Medicare Other | Attending: General Practice | Admitting: General Practice

## 2021-10-19 ENCOUNTER — Encounter: Payer: Self-pay | Admitting: General Practice

## 2021-10-19 VITALS — BP 120/80 | HR 64 | Ht 65.0 in | Wt 249.0 lb

## 2021-10-19 DIAGNOSIS — Z952 Presence of prosthetic heart valve: Secondary | ICD-10-CM

## 2021-10-19 DIAGNOSIS — I5189 Other ill-defined heart diseases: Secondary | ICD-10-CM

## 2021-10-19 DIAGNOSIS — I442 Atrioventricular block, complete: Secondary | ICD-10-CM | POA: Diagnosis not present

## 2021-10-19 DIAGNOSIS — I2510 Atherosclerotic heart disease of native coronary artery without angina pectoris: Secondary | ICD-10-CM

## 2021-10-19 DIAGNOSIS — I6523 Occlusion and stenosis of bilateral carotid arteries: Secondary | ICD-10-CM

## 2021-10-19 DIAGNOSIS — I428 Other cardiomyopathies: Secondary | ICD-10-CM

## 2021-10-19 DIAGNOSIS — E782 Mixed hyperlipidemia: Secondary | ICD-10-CM

## 2021-10-19 NOTE — Patient Instructions (Addendum)
Medication Instructions:  The current medical regimen is effective;  continue present plan and medications as directed. Please refer to the Current Medication list given to you today.   *If you need a refill on your cardiac medications before your next appointment, please call your pharmacy*  Lab Work:   Testing/Procedures:  NONE    NONE If you have labs (blood work) drawn today and your tests are completely normal, you will receive your results only by:  1-MyChart Message (if you have MyChart) OR  2-A paper copy in the mail.  If you have any lab test that is abnormal or we need to change your treatment, we will call you to review the results.  Special Instructions INCREASE PHYSICAL  ACTIVITY AS TOLERATED  Follow-Up: Your next appointment:  6 month(s) In Person with Quay Burow, MD    At [facility], you and your health needs are our priority.  As part of our continuing mission to provide you with exceptional heart care, we have created designated Provider Care Teams.  These Care Teams include your primary Cardiologist (physician) and Advanced Practice Providers (APPs -  Physician Assistants and Nurse Practitioners) who all work together to provide you with the care you need, when you need it.  We recommend signing up for the patient portal called "MyChart".  Sign up information is provided on this After Visit Summary.  MyChart is used to connect with patients for Virtual Visits (Telemedicine).  Patients are able to view lab/test results, encounter notes, upcoming appointments, etc.  Non-urgent messages can be sent to your provider as well.   To learn more about what you can do with MyChart, go to [email or web address].    Important Information About Sugar

## 2021-10-26 ENCOUNTER — Ambulatory Visit (INDEPENDENT_AMBULATORY_CARE_PROVIDER_SITE_OTHER): Payer: Medicare Other

## 2021-10-26 DIAGNOSIS — Z5181 Encounter for therapeutic drug level monitoring: Secondary | ICD-10-CM | POA: Diagnosis not present

## 2021-10-26 DIAGNOSIS — Z952 Presence of prosthetic heart valve: Secondary | ICD-10-CM | POA: Diagnosis not present

## 2021-10-26 LAB — POCT INR: INR: 4.3 — AB (ref 2.0–3.0)

## 2021-10-26 NOTE — Patient Instructions (Signed)
Description   SELF TESTER -Spoke with pt and instructed to HOLD today's dose and then continue taking 1.5 tablets daily EXCEPT 1 tablet on Tuesday, Thursday, and Saturday.   Recheck INR in 2 weeks Coumadin Clinic [phone]

## 2021-11-07 NOTE — Progress Notes (Signed)
Remote pacemaker transmission.   

## 2021-11-09 ENCOUNTER — Ambulatory Visit (INDEPENDENT_AMBULATORY_CARE_PROVIDER_SITE_OTHER): Payer: Medicare Other

## 2021-11-09 DIAGNOSIS — Z952 Presence of prosthetic heart valve: Secondary | ICD-10-CM

## 2021-11-09 DIAGNOSIS — Z5181 Encounter for therapeutic drug level monitoring: Secondary | ICD-10-CM

## 2021-11-09 LAB — POCT INR: INR: 3.5 — AB (ref 2.0–3.0)

## 2021-11-09 NOTE — Patient Instructions (Signed)
Description   SELF TESTER -Spoke with pt and instructed to continue taking 1.5 tablets daily EXCEPT 1 tablet on Tuesday, Thursday, and Saturday.   Recheck INR in 2 weeks Coumadin Clinic [phone]

## 2021-11-23 ENCOUNTER — Ambulatory Visit (INDEPENDENT_AMBULATORY_CARE_PROVIDER_SITE_OTHER): Payer: Medicare Other | Admitting: Cardiology

## 2021-11-23 ENCOUNTER — Telehealth: Payer: Self-pay

## 2021-11-23 DIAGNOSIS — Z5181 Encounter for therapeutic drug level monitoring: Secondary | ICD-10-CM | POA: Diagnosis not present

## 2021-11-23 LAB — POCT INR: INR: 3.4 — AB (ref 2.0–3.0)

## 2021-11-23 NOTE — Telephone Encounter (Signed)
Reminded patient to check INR.  Verbalized understanding 

## 2021-12-07 ENCOUNTER — Ambulatory Visit (INDEPENDENT_AMBULATORY_CARE_PROVIDER_SITE_OTHER): Payer: Medicare Other

## 2021-12-07 DIAGNOSIS — Z5181 Encounter for therapeutic drug level monitoring: Secondary | ICD-10-CM | POA: Diagnosis not present

## 2021-12-07 DIAGNOSIS — Z952 Presence of prosthetic heart valve: Secondary | ICD-10-CM | POA: Diagnosis not present

## 2021-12-07 LAB — POCT INR: INR: 4.7 — AB (ref 2.0–3.0)

## 2021-12-07 NOTE — Patient Instructions (Signed)
Description   SELF TESTER -Spoke with pt and instructed to HOLD today's dose and eat greens and then resume taking 1.5 tablets daily EXCEPT 1 tablet on Tuesday, Thursday, and Saturday.   Recheck INR in 1 week Coumadin Clinic [phone]

## 2021-12-14 ENCOUNTER — Ambulatory Visit (INDEPENDENT_AMBULATORY_CARE_PROVIDER_SITE_OTHER): Payer: Medicare Other | Admitting: *Deleted

## 2021-12-14 DIAGNOSIS — Z5181 Encounter for therapeutic drug level monitoring: Secondary | ICD-10-CM

## 2021-12-14 DIAGNOSIS — Z952 Presence of prosthetic heart valve: Secondary | ICD-10-CM

## 2021-12-14 LAB — POCT INR: INR: 3 (ref 2.0–3.0)

## 2021-12-28 ENCOUNTER — Ambulatory Visit (INDEPENDENT_AMBULATORY_CARE_PROVIDER_SITE_OTHER): Payer: Medicare Other | Admitting: *Deleted

## 2021-12-28 DIAGNOSIS — Z952 Presence of prosthetic heart valve: Secondary | ICD-10-CM

## 2021-12-28 DIAGNOSIS — Z5181 Encounter for therapeutic drug level monitoring: Secondary | ICD-10-CM | POA: Diagnosis not present

## 2021-12-28 LAB — POCT INR: INR: 5 — AB (ref 2.0–3.0)

## 2021-12-28 NOTE — Patient Instructions (Signed)
Description   SELF TESTER -Spoke with pt and instructed to hold today's dose and take 1 tablet tomorrow and have some leafy veggies today then continue taking 1.5 tablets daily EXCEPT 1 tablet on Tuesday, Thursday, and Saturday. Recheck INR in 1 week.  Coumadin Clinic [phone]

## 2022-01-04 ENCOUNTER — Ambulatory Visit (INDEPENDENT_AMBULATORY_CARE_PROVIDER_SITE_OTHER): Payer: Medicare Other

## 2022-01-04 DIAGNOSIS — Z952 Presence of prosthetic heart valve: Secondary | ICD-10-CM

## 2022-01-04 DIAGNOSIS — Z5181 Encounter for therapeutic drug level monitoring: Secondary | ICD-10-CM

## 2022-01-04 LAB — POCT INR: INR: 4.6 — AB (ref 2.0–3.0)

## 2022-01-04 NOTE — Patient Instructions (Signed)
Description   SELF TESTER -Spoke with pt and instructed to hold today's dose and have some leafy veggies today then START taking 1 tablet daily EXCEPT 1.5 tablet on Mondays, Wednesdays, and Fridays.  Recheck INR on 01/15/22 Coumadin Clinic [phone]

## 2022-01-05 ENCOUNTER — Other Ambulatory Visit: Payer: Self-pay | Admitting: General Practice

## 2022-01-10 ENCOUNTER — Ambulatory Visit (INDEPENDENT_AMBULATORY_CARE_PROVIDER_SITE_OTHER): Payer: Medicare Other

## 2022-01-10 DIAGNOSIS — I442 Atrioventricular block, complete: Secondary | ICD-10-CM | POA: Diagnosis not present

## 2022-01-10 LAB — CUP PACEART REMOTE DEVICE CHECK
Battery Remaining Longevity: 17 mo
Battery Remaining Percentage: 15 %
Battery Voltage: 2.86 V
Brady Statistic AP VP Percent: 65 %
Brady Statistic AP VS Percent: 1 %
Brady Statistic AS VP Percent: 35 %
Brady Statistic AS VS Percent: 1 %
Brady Statistic RA Percent Paced: 62 %
Brady Statistic RV Percent Paced: 99 %
Date Time Interrogation Session: 20231122021901
Implantable Lead Connection Status: 753985
Implantable Lead Connection Status: 753985
Implantable Lead Implant Date: 20150116
Implantable Lead Implant Date: 20150116
Implantable Lead Location: 753859
Implantable Lead Location: 753860
Implantable Lead Model: 5076
Implantable Pulse Generator Implant Date: 20150116
Lead Channel Impedance Value: 350 Ohm
Lead Channel Impedance Value: 580 Ohm
Lead Channel Pacing Threshold Amplitude: 0.5 V
Lead Channel Pacing Threshold Amplitude: 0.75 V
Lead Channel Pacing Threshold Pulse Width: 0.4 ms
Lead Channel Pacing Threshold Pulse Width: 0.4 ms
Lead Channel Sensing Intrinsic Amplitude: 4.4 mV
Lead Channel Sensing Intrinsic Amplitude: 7.8 mV
Lead Channel Setting Pacing Amplitude: 2 V
Lead Channel Setting Pacing Amplitude: 2.5 V
Lead Channel Setting Pacing Pulse Width: 0.4 ms
Lead Channel Setting Sensing Sensitivity: 2 mV
Pulse Gen Model: 2240
Pulse Gen Serial Number: 7587004

## 2022-01-15 ENCOUNTER — Ambulatory Visit (INDEPENDENT_AMBULATORY_CARE_PROVIDER_SITE_OTHER): Payer: Medicare Other | Admitting: *Deleted

## 2022-01-15 ENCOUNTER — Telehealth: Payer: Self-pay | Admitting: *Deleted

## 2022-01-15 DIAGNOSIS — Z5181 Encounter for therapeutic drug level monitoring: Secondary | ICD-10-CM | POA: Diagnosis not present

## 2022-01-15 DIAGNOSIS — Z952 Presence of prosthetic heart valve: Secondary | ICD-10-CM | POA: Diagnosis not present

## 2022-01-15 LAB — POCT INR: INR: 3.3 — AB (ref 2.0–3.0)

## 2022-01-15 NOTE — Patient Instructions (Signed)
Description   SELF TESTER-Spoke with pt and instructed to continue taking warfarin 1 tablet daily EXCEPT 1.5 tablet on Mondays, Wednesdays, and Fridays.  Recheck INR in 2 weeks. Coumadin Clinic [phone]

## 2022-01-15 NOTE — Telephone Encounter (Signed)
Called pt since she due to have INR done; she stated she will do INR in a few. Will await.

## 2022-01-22 ENCOUNTER — Telehealth: Payer: Self-pay | Admitting: Cardiovascular Disease

## 2022-01-22 NOTE — Telephone Encounter (Signed)
Pt c/o swelling: STAT is pt has developed SOB within 24 hours  If swelling, where is the swelling located? LE Adema  How much weight have you gained and in what time span? 8 or 9 lbs  Have you gained 3 pounds in a day or 5 pounds in a week? Yes   Do you have a log of your daily weights (if so, list)? Yes   Are you currently taking a fluid pill? Yes   Are you currently SOB? Yes   Have you traveled recently? No

## 2022-01-22 NOTE — Telephone Encounter (Signed)
Spoke with patient and scheduled her with Thomasene Mohair for 12/13.

## 2022-01-22 NOTE — Telephone Encounter (Signed)
Pt was told by PCP to call  office re swelling  did not see BNP drawn Per pt legs remain swollen even after take Furosemide 40 mg  bid for the last 4-5 days PCP increased lasix Pt does note swelling not as bad is not up to the knee area as before but still there Encourage to elevate and get compression stockings Also pt to watch salt  intake Pt did eat small amount of ham over thanksgiving will forward to Dr Gwenlyn Found for review and recommendations ./cy

## 2022-01-29 ENCOUNTER — Telehealth: Payer: Self-pay

## 2022-01-29 NOTE — Telephone Encounter (Signed)
Reminded patient to check INR today.  Verbalized understanding 

## 2022-01-30 ENCOUNTER — Telehealth: Payer: Self-pay | Admitting: *Deleted

## 2022-01-30 ENCOUNTER — Ambulatory Visit (INDEPENDENT_AMBULATORY_CARE_PROVIDER_SITE_OTHER): Payer: Medicare Other | Admitting: Internal Medicine

## 2022-01-30 DIAGNOSIS — Z5181 Encounter for therapeutic drug level monitoring: Secondary | ICD-10-CM

## 2022-01-30 DIAGNOSIS — Z952 Presence of prosthetic heart valve: Secondary | ICD-10-CM

## 2022-01-30 LAB — POCT INR: INR: 3 (ref 2.0–3.0)

## 2022-01-30 NOTE — Progress Notes (Unsigned)
Cardiology Clinic Note   Patient Name: [name] Date of Encounter: 01/31/2022  Primary Care Provider:  [name], MD Primary Cardiologist:  Quay Burow, MD  Patient Profile    [name] 71 year old female presents to the clinic today for follow-up evaluation of her acute on chronic combined systolic and diastolic CHF.  Past Medical History    Past Medical History:  Diagnosis Date   Antiphospholipid antibody positive 06/28/2017   Asthma    CAD (coronary artery disease)    a. normal cors by cath in 03/2015   Carotid artery disease ([place])    Chronic anticoagulation    Complete heart block ([place]) 02/2013   a. Implantation of a dual chamber pacemaker in 02/2013 by Dr Rayann Heman. STJ model number 8676 pacemaker with model number 2088 right ventricular lead.    Dyslipidemia    H/O Doppler ultrasound 10/03/2010   carotid duplex - R ICA normal patency; L CCA-ICA bypass gradt patent common to interal cartoid bypass graft    H/O mechanical aortic valve replacement    History of echocardiogram 11/08/2011   EF >55%; mild-mod concentric LVH; trace aortic regurg.; LA mild-mod dilated   History of nuclear stress test 01/20/2010   normal pattern of perfusion; low risk scan   Hypertension    Lupus (systemic lupus erythematosus) (HCC)    PVD (peripheral vascular disease) (HCC)    60% R renal artery stenosis; non-functioning L kidney   Past Surgical History:  Procedure Laterality Date   AORTIC VALVE REPLACEMENT  11/29/006   [facility]; [facility] Jude; on coumadin   CARDIAC CATHETERIZATION  11/01/2004   define anatomy    CARDIAC CATHETERIZATION N/A 04/19/2015   Procedure: Coronary Angiography;  Surgeon: Peter M [name], MD;  Location: [facility];  Service: Cardiovascular;  Laterality: N/A;   CAROTID ENDARTERECTOMY     lt.   CHOLECYSTECTOMY  1999   EXPLORATORY LAPAROTOMY  04/12/2012   small bowel perf; ileostomy & R hemicolectomy    PACEMAKER INSERTION  03/06/2013   SJM [employer] DR  PPM implanted by Dr Rayann Heman for complete heart block   PERMANENT PACEMAKER INSERTION N/A 03/06/2013   Procedure: PERMANENT PACEMAKER INSERTION;  Surgeon: Coralyn Mark, MD;  Location: [facility];  Service: Cardiovascular;  Laterality: N/A;   TEMPORARY PACEMAKER INSERTION N/A 03/06/2013   Procedure: TEMPORARY PACEMAKER INSERTION;  Surgeon: Coralyn Mark, MD;  Location: [facility];  Service: Cardiovascular;  Laterality: N/A;   TUBAL LIGATION  11/1982    Allergies  Allergies  Allergen Reactions   Spironolactone Other (See Comments)    Hyperkalemia    History of Present Illness    [name] has a PMH of atrial fibrillation with mechanical AVR on warfarin, hypertension, chronic combined systolic and diastolic CHF, and renal insufficiency.  Her echocardiogram 12/20/2020 showed an LVEF of 30-35%.  She was referred to pharmacy by Dr. Rayann Heman for management of her heart failure medication.  She was initiated on Entresto 24-26 and repeat labs showed hyperkalemia.  Her lab work from 01/09/2021 showed a potassium of 5.4.  She was given 2 days of Lokelma and her follow-up lab work on 01/16/2021 showed a potassium of 4.4.  During her follow-up visit with pharmacy 12/30/2020 she shortness of breath with increased physical activity.  She denied chest pain and palpitations.  She was able to complete all of her ADLs.  She had not been checking her blood pressure at home because she felt her blood pressure cuff  was not accurate.  She was noted to have bilateral lower extremity swelling was compliant with her furosemide.  She had been weighing herself daily and reported that it was stable.  Of note she was also unable to tolerate spironolactone in 2018 due to hyperkalemia.  She presented to the clinic 01/24/21 for follow-up evaluation stated she felt well.  She was also seeing nephrology. We reviewed her prior medications and her increased potassium.  She did not tolerate Entresto.  We started  her on losartan  25 mg daily.  I gave her the [place] support stocking sheet and a salty 6 diet sheet.  We planned follow-up for 1 month with me or pharmacy for up titration of her losartan.  She presented to the clinic 03/03/21 for follow-up evaluation stated she felt well .  She did have a cough over the Christmas holidays.  She felt that she picked up a virus from her granddaughter who was staying with her at the time.  She felt she had recovered well.  Her furosemide was being dosed by nephrology.  She was taking 40 mg daily.  She brought in her BMP  that showed a creatinine of 1.61.  I continued her current dosing of her losartan and planned repeat  echocardiogram.  She reported that she had not been eating out and was avoiding salt.  Her diet made a significant difference in her weight and lower extremity swelling.  She was euvolemic .  And her weight was down another 2 pounds.    We planned her follow-up in 4 months.  She was seen by Dr. Gwenlyn Found on 07/12/2021.  She continued to be followed by the A-fib clinic.  Her sinus rhythm has maintained with Tikosyn therapy.  She was compliant with Coumadin therapy.  She continued to do well.  She denied chest pain and shortness of breath.  Her echocardiogram showed normal LV systolic function with G2 DD and well-functioning mechanical aortic valve which had been placed on 03/10/2021.  She was trialed on spironolactone however it was discontinued due to hyperkalemia.  Her Delene Loll was also transitioned to losartan.  She presented to the clinic 10/19/21 for follow-up evaluation stated she noticed some blood in the toilet 2 to 3 weeks ago for 1 occurrence.  Then 2 to 3 weeks later she noticed another recurrence of blood in her toilet.  She  had no further occurrences.  She reported that she had occasional dietary indiscretion but tried to be very strict with her low-salt diet.  Over the last week she noted some increased shortness of breath however in the last 2 days her breathing has  been much better.  She had been staying active helping care for her granddaughter who had started middle school.  She did report that over the last 2 to 3 months she had not been walking as much.  I encouraged her to continue her heart healthy low-sodium diet, increase her physical activity as tolerated and we  planned follow-up in 6 months.  She contacted nurse triage line on 01/22/2022 and reported a 8-9 pound weight increase and shortness of breath that had developed over the previous 24 hours.  She presents to clinic today for follow-up evaluation and states she is feeling much better today.  She reports that for Thanksgiving she had ham and then a few days later had leftover ham.  She reports that her weight climbed to 251 pounds and she noted burning with her breathing.  She took 5 days  of double Lasix and her weight today is 245 pounds.  She has returned to her normal sleeping.  She does note some dizziness that appears to be related to her vestibular system.  We reviewed her upcoming appointment with Dr.Berry and upcoming echocardiogram.  I will order a BMP today and have her follow-up as scheduled.  Today she denies chest pain, shortness of breath,  fatigue, palpitations, melena, hematuria, hemoptysis, diaphoresis, weakness, presyncope, syncope, orthopnea, and PND.     Home Medications    Prior to Admission medications   Medication Sig Start Date End Date Taking? Authorizing Provider  acetaminophen (TYLENOL) 325 MG tablet Take 650 mg by mouth every 6 (six) hours as needed for moderate pain.     [provider]  albuterol (PROVENTIL HFA;VENTOLIN HFA) 108 (90 Base) MCG/ACT inhaler Inhale 2 puffs into the lungs every 6 (six) hours as needed for wheezing or shortness of breath. 06/10/17   Baird Lyons D, MD  allopurinol (ZYLOPRIM) 100 MG tablet Take 100 mg by mouth daily.     [provider]  calcium carbonate (OSCAL) 1500 (600 Ca) MG TABS tablet Take 1 tablet by mouth 2  (two) times daily.    [provider]  cetirizine (ZYRTEC) 10 MG tablet Take 10 mg by mouth at bedtime.    [provider]  chlorhexidine (PERIDEX) 0.12 % solution Use as directed 10 mLs in the mouth or throat 2 (two) times daily as needed (before dental appts).  03/31/15   [provider]  cholecalciferol (VITAMIN D) 1000 UNITS tablet Take 2,000 Units by mouth 2 (two) times daily.     [provider]  clotrimazole-betamethasone (LOTRISONE) cream as needed. 11/04/18   [provider]  colestipol (COLESTID) 1 g tablet Take 1 g by mouth daily.    [provider]  dicyclomine (BENTYL) 20 MG tablet Take by mouth. 07/15/19   [provider]  dofetilide (TIKOSYN) 250 MCG capsule Take 1 capsule by mouth twice daily 07/22/20   Fenton, Clint R, PA  Ferrous Fumarate-Folic Acid (HEMOCYTE-F) 324-1 MG TABS Take 1 tablet by mouth daily. 09/20/20   [provider]  fluticasone-salmeterol Grant Ruts INHUB) 250-50 MCG/ACT AEPB USE 1 INHALATION BY MOUTH  twice daily 11/25/20   Baird Lyons D, MD  furosemide (LASIX) 20 MG tablet Take 1 tablet (20 mg total) by mouth daily. 10/19/20   [name], MD  hydroxychloroquine (PLAQUENIL) 200 MG tablet Take 200 mg by mouth daily.    [provider]  isosorbide mononitrate (IMDUR) 30 MG 24 hr tablet TAKE 1 TABLET BY MOUTH  DAILY 11/22/20   [name], MD  leflunomide (ARAVA) 10 MG tablet Take 10 mg by mouth daily.    [provider]  loratadine (CLARITIN) 10 MG tablet Take 10 mg by mouth daily.    [provider]  Magnesium 200 MG TABS Take 1 tablet (200 mg total) by mouth daily. 04/15/18   Sherran Needs, NP  metoprolol succinate (TOPROL-XL) 50 MG 24 hr tablet TAKE 1 TABLET BY MOUTH  DAILY WITH OR IMMEDIATELY  FOLLOWING A MEAL 11/22/20   [name], MD  potassium chloride SA (KLOR-CON) 20 MEQ tablet TAKE 1  BY MOUTH TWICE DAILY 07/06/20   Sherran Needs, NP  predniSONE  (DELTASONE) 5 MG tablet Take 5 mg by mouth daily.    [provider]  Probiotic Product (PROBIOTIC DAILY PO) Take 1 tablet by mouth daily.    [provider]  Saccharomyces boulardii (PROBIOTIC) 250 MG CAPS 1 capsule    [provider]  simvastatin (ZOCOR) 20 MG tablet Take 1 tablet by mouth at  bedtime 06/07/15   [name], MD  sodium zirconium cyclosilicate (LOKELMA) 10 g PACK packet Take 10 g by mouth 2 (two) times daily. 01/10/21   Allred, Jeneen Rinks, MD  warfarin (COUMADIN) 5 MG tablet TAKE 1 TO 1 AND 1/2 TABLETS BY MOUTH DAILY AS DIRECTED  BY COUMADIN CLINIC 08/10/20   [name], MD    Family History    Family History  Problem Relation Age of Onset   Alzheimer's disease Father    Cirrhosis Father    Cancer Father        prostate   Heart attack Neg Hx    She indicated that her mother is alive. She indicated that her father is deceased. She indicated that her maternal grandmother is deceased. She indicated that her maternal grandfather is deceased. She indicated that her paternal grandmother is deceased. She indicated that her paternal grandfather is deceased. She indicated that the status of her neg hx is unknown.   Social History    Social History   Socioeconomic History   Marital status: Divorced    Spouse name: Not on file   Number of children: 3   Years of education: Not on file   Highest education level: Not on file  Occupational History   Occupation: Retired  Tobacco Use   Smoking status: Never   Smokeless tobacco: Never  Vaping Use   Vaping Use: Never used  Substance and Sexual Activity   Alcohol use: No   Drug use: No   Sexual activity: Not on file  Other Topics Concern   Not on file  Social History Narrative   Lives alone in [place].   Social Determinants of Health   Financial Resource Strain: Not on file  Food Insecurity: Not on file  Transportation Needs: Not on file  Physical Activity: Not on file  Stress:  Not on file  Social Connections: Not on file  Intimate Partner Violence: Not on file     Review of Systems    General:  No chills, fever, night sweats or weight changes.  Cardiovascular:  No chest pain, dyspnea on exertion, edema, orthopnea, palpitations, paroxysmal nocturnal dyspnea. Dermatological: No rash, lesions/masses Respiratory: No cough, dyspnea Urologic: No hematuria, dysuria Abdominal:   No nausea, vomiting, diarrhea, bright red blood per rectum, melena, or hematemesis Neurologic:  No visual changes, wkns, changes in mental status. All other systems reviewed and are otherwise negative except as noted above.  Physical Exam    VS:  BP 122/80   Pulse 72   Ht 5\' 5"  (1.651 m)   Wt 245 lb 3.2 oz (111.2 kg)   SpO2 97%   BMI 40.80 kg/m  , BMI Body mass index is 40.8 kg/m. GEN: Well nourished, well developed, in no acute distress. HEENT: normal. Neck: Supple, no JVD, carotid bruits, or masses. Cardiac: RRR, no murmurs, rubs, or gallops. No clubbing, cyanosis, no edema.  Radials/DP/PT 2+ and equal bilaterally.  Respiratory:  Respirations regular and unlabored, clear to auscultation bilaterally. GI: Soft, nontender, nondistended, BS + x 4. MS: no deformity or atrophy. Skin: warm and dry, no rash. Neuro:  Strength and sensation are intact. Psych: Normal affect.  Accessory Clinical Findings    Recent Labs: No results found for requested labs within last 365 days.   Recent Lipid Panel    Component Value  Date/Time   CHOL 174 12/02/2019 1125   TRIG 124 12/02/2019 1125   HDL 99 12/02/2019 1125   CHOLHDL 1.8 12/02/2019 1125   LDLCALC 54 12/02/2019 1125    ECG personally reviewed by me today-none today.  Echocardiogram 12/20/2020  IMPRESSIONS     1. Left ventricular ejection fraction, by estimation, is 30 to 35%. The  left ventricle has moderately decreased function. The left ventricle  demonstrates regional wall motion abnormalities with septal and apical   hypokinesis and septal-lateral  dyssynchrony. The left ventricular internal cavity size was mildly  dilated. There is mild left ventricular hypertrophy. Left ventricular  diastolic parameters are consistent with Grade II diastolic dysfunction  (pseudonormalization).   2. Right ventricular systolic function is mildly reduced. The right  ventricular size is moderately enlarged. There is moderately elevated  pulmonary artery systolic pressure. The estimated right ventricular  systolic pressure is 93.7 mmHg.   3. Left atrial size was severely dilated.   4. Right atrial size was severely dilated.   5. The mitral valve is abnormal. Moderate to severe mitral valve  regurgitation with restriction of the posterior mitral leaflet. No  evidence of mitral stenosis.   6. The tricuspid valve is abnormal. Tricuspid valve regurgitation is  severe (torrential), ? impaired valve movement due to pacemaker. Systolic  flow reversal in the hepatic vein PW doppler pattern.   7. [employer] mechanical aortic valve. Mean gradient 18 mmHg with  trivial aortic insufficiency.   8. Aortic dilatation noted. There is mild dilatation of the ascending  aorta, measuring 40 mm.   9. The inferior vena cava is dilated in size with <50% respiratory  variability, suggesting right atrial pressure of 15 mmHg.   Comparison(s): 12/31/19 EF 40-45%. PA pressure 63mmHg. AV 82mmHg mean PG,  32mmHg peak PG.  Assessment & Plan   1.  Chronic Diastolic CHF-noted significant weight gain after eating Thanksgiving ham.  She took double Lasix for 5 days.  Weight today 245.2.  Previously did not tolerate Entresto due to hyperkalemia.  Also previously did not tolerate spironolactone due to hyperkalemia.  Echocardiogram 1/23 showed normal LVEF, G2 DD, well-functioning prosthetic aortic valve. Continue metoprolol, losartan, furosemide  Daily weights-contact office with weight increase of 2 to 3 pounds overnight or 5 pounds in 1 week. Heart  healthy low-sodium diet-reviewed Maintain physical activity Repeat echocardiogram 1/24 Order BMP  Dizziness-appears to be vestibular in nature.  Has ordered a cane. Practice Epley maneuvers Positions slowly  Complete heart block-status post [facility] Jude PPM which was implanted 1/15 Follows with EP  Aortic valve stenosis-  Status post aortic valve replacement  AVR 2006.  Echocardiogram 1/23 reassuring with stable aortic valve prosthesis. Continue to monitor  Coronary artery disease-no recent episodes of arm neck back or chest discomfort.  Continue metoprolol, simvastatin Heart healthy low-sodium diet-reviewed Increase physical activity as tolerated  Hyperlipidemia-LDL 54 on 12/02/19 Continue simvastatin Heart healthy low-sodium diet-salty 6 given Increase physical activity as tolerated Follows with PCP    Disposition: Follow-up with Dr. Gwenlyn Found as scheduled  Jossie Ng. Jeshua Ransford NP-C    01/31/2022, 2:15 PM [place] [address] [place] Office [phone] Fax [phone]  Notice: This dictation was prepared with Dragon dictation along with smaller phrase technology. Any transcriptional errors that result from this process are unintentional and may not be corrected upon review.  I spent 14 minutes examining this patient, reviewing medications, and using patient centered shared decision making involving her cardiac care.  Prior to  her visit I spent greater than 20 minutes reviewing her past medical history,  medications, and prior cardiac tests.

## 2022-01-30 NOTE — Telephone Encounter (Signed)
Called pt since her INR is overdue. There was no answer therefore, left a message for her to call back. Will await.

## 2022-01-31 ENCOUNTER — Encounter: Payer: Self-pay | Admitting: General Practice

## 2022-01-31 ENCOUNTER — Ambulatory Visit: Payer: Medicare Other | Attending: General Practice | Admitting: General Practice

## 2022-01-31 VITALS — BP 122/80 | HR 72 | Ht 65.0 in | Wt 245.2 lb

## 2022-01-31 DIAGNOSIS — Z952 Presence of prosthetic heart valve: Secondary | ICD-10-CM | POA: Diagnosis not present

## 2022-01-31 DIAGNOSIS — I2510 Atherosclerotic heart disease of native coronary artery without angina pectoris: Secondary | ICD-10-CM

## 2022-01-31 DIAGNOSIS — I5042 Chronic combined systolic (congestive) and diastolic (congestive) heart failure: Secondary | ICD-10-CM

## 2022-01-31 DIAGNOSIS — E782 Mixed hyperlipidemia: Secondary | ICD-10-CM

## 2022-01-31 DIAGNOSIS — I442 Atrioventricular block, complete: Secondary | ICD-10-CM

## 2022-01-31 DIAGNOSIS — R42 Dizziness and giddiness: Secondary | ICD-10-CM

## 2022-01-31 NOTE — Progress Notes (Signed)
Remote pacemaker transmission.   

## 2022-01-31 NOTE — Patient Instructions (Signed)
Medication Instructions:  The current medical regimen is effective;  continue present plan and medications as directed. Please refer to the Current Medication list given to you today.  *If you need a refill on your cardiac medications before your next appointment, please call your pharmacy*  Lab Work: BMET TODAY If you have labs (blood work) drawn today and your tests are completely normal, you will receive your results only by:  [place] (if you have MyChart) OR A paper copy in the mail If you have any lab test that is abnormal or we need to change your treatment, we will call you to review the results.  Testing/Procedures: MAKE SURE TO KEEP APPOINTMENT FOR YOUR ECHO  Follow-Up: At [facility], you and your health needs are our priority.  As part of our continuing mission to provide you with exceptional heart care, we have created designated Provider Care Teams.  These Care Teams include your primary Cardiologist (physician) and Advanced Practice Providers (APPs -  Physician Assistants and Nurse Practitioners) who all work together to provide you with the care you need, when you need it.  We recommend signing up for the patient portal called "MyChart".  Sign up information is provided on this After Visit Summary.  MyChart is used to connect with patients for Virtual Visits (Telemedicine).  Patients are able to view lab/test results, encounter notes, upcoming appointments, etc.  Non-urgent messages can be sent to your provider as well.   To learn more about what you can do with MyChart, go to [email or web address].    Your next appointment:   KEEP SCHEDULED  APPOINTMENT  The format for your next appointment:   In Person  Provider:   Quay Burow, MD    Other Instructions PLEASE DO EPLEY MANEUVERS-SEE BELOW 4 TIMES IN THE AM AND THEN DO IT THE 4 TIMES AS WELL-SEE DIRECTIONS BELOW  Important Information About Sugar

## 2022-02-01 LAB — BASIC METABOLIC PANEL
BUN/Creatinine Ratio: 23 (ref 12–28)
BUN: 34 mg/dL — ABNORMAL HIGH (ref 8–27)
CO2: 23 mmol/L (ref 20–29)
Calcium: 9.4 mg/dL (ref 8.7–10.3)
Chloride: 103 mmol/L (ref 96–106)
Creatinine, Ser: 1.47 mg/dL — ABNORMAL HIGH (ref 0.57–1.00)
Glucose: 104 mg/dL — ABNORMAL HIGH (ref 70–99)
Potassium: 4.9 mmol/L (ref 3.5–5.2)
Sodium: 141 mmol/L (ref 134–144)
eGFR: 38 mL/min/{1.73_m2} — ABNORMAL LOW (ref >59)

## 2022-02-04 NOTE — Progress Notes (Signed)
Subjective:    Patient ID: [name], female    DOB: 1950-11-06, 71 y.o.   MRN: [id number]  HPI female never smoker followed for asthma complicated by history of lupus, aortic valve replacement/ pacemaker/ warfarin. Office spirometry 04/22/15- -------------------------------------------------------------------------------   02/02/21- 71 year old female never smoker followed for Asthma complicated by history of lupus, aortic valve replacement/ pacemaker/ warfarin, obesity, Gout,  -Prednisone 5 mg daily (for lupus), Wixela 250, albuterol hfa Covid vax- 3 Phizer Flu vax-had -----Patient feels like her breathing is about the same since last visit. 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  No routine cough or wheeze and no acute events since last year.  She does notice dyspnea on exertion walking distances or climbing stairs. Continues to follow with her cardiologist.  02/05/22- 71 year old female never smoker followed for Asthma complicated by history of lupus, aortic valve replacement/ pacemaker/ warfarin, obesity, Gout,  -Prednisone 5 mg daily (for lupus), Wixela 250, albuterol hfa Covid vax- 3 Phizer Flu vax-had ACT score -23 Fluid retention at Thanksgiving managed by cardiology and feels she is well controlled now. Using Wixela and albuterol about 1x/ day. Medications discussed.   Review of Systems- see HPI    + = positive Constitutional:   No-   weight loss, night sweats, fevers, chills, fatigue, lassitude. HEENT:   No-  headaches, difficulty swallowing, tooth/dental problems, sore throat,       No-  sneezing, itching, +ear pressure, + controlled nasal congestion, post nasal drip,  CV:  No-   chest pain, orthopnea, PND, swelling in lower extremities, anasarca,dizziness, palpitations Resp: +shortness of breath with exertion or at rest.              No-   productive cough, + non-productive cough,  No-  coughing  up of blood.              No-   change in color of mucus.  No- wheezing.   Skin: No-   rash or lesions. GI:  No-   heartburn, indigestion, abdominal pain, nausea, vomiting,  GU:  MS:  No-   joint pain or swelling.   Neuro- nothing unusual  Psych:  No- change in mood or affect. No depression or anxiety.  No memory loss.  Objective:   Physical Exam General- Alert, Oriented, Affect-appropriate, Distress- none acute;  +obese Skin- rash-none, lesions- none, excoriation- none Lymphadenopathy- none Head- atraumatic            Eyes- Gross vision intact, PERRLA, conjunctivae clear secretions            Ears- Hearing, canals normal            Nose- Clear, no-Septal dev, mucus, polyps, erosion, perforation             Throat- Mallampati III , mucosa clear , drainage- none, tonsils- atrophic Neck- flexible , trachea midline, no stridor , thyroid nl, carotid no bruit Chest - symmetrical excursion , unlabored           Heart/CV- RRR , +crisp valve sounds,  5-4/6 Systolic AS  Murmur, no gallop  , no rub, nl s1 s2                           - JVD+1 , edema+1 in feet, stasis changes- none, varices- none           Lung- wheeze-none, cough-  none , dullness-none, rub- none, rales-none           Chest wall- L pacer Abd-  Br/ Gen/ Rectal- Not done, not indicated Extrem- cyanosis- none, clubbing, none, atrophy- none, strength- nl,        + superficial varices Neuro- grossly intact to observation  Assessment & Plan:

## 2022-02-05 ENCOUNTER — Ambulatory Visit: Payer: Medicare Other | Admitting: Internal Medicine

## 2022-02-05 ENCOUNTER — Encounter: Payer: Self-pay | Admitting: Internal Medicine

## 2022-02-05 VITALS — BP 130/80 | HR 65 | Ht 65.0 in | Wt 245.2 lb

## 2022-02-05 DIAGNOSIS — J4520 Mild intermittent asthma, uncomplicated: Secondary | ICD-10-CM | POA: Diagnosis not present

## 2022-02-05 DIAGNOSIS — I509 Heart failure, unspecified: Secondary | ICD-10-CM | POA: Diagnosis not present

## 2022-02-05 MED ORDER — FLUTICASONE-SALMETEROL 250-50 MCG/ACT IN AEPB: INHALATION_SPRAY | RESPIRATORY_TRACT | 3 refills | Status: DC

## 2022-02-05 MED ORDER — ALBUTEROL SULFATE HFA 108 (90 BASE) MCG/ACT IN AERS: 2.0000 | INHALATION_SPRAY | Freq: Four times a day (QID) | RESPIRATORY_TRACT | 3 refills | Status: DC | PRN

## 2022-02-05 NOTE — Patient Instructions (Signed)
Inhalers refilled through [facility]  Please call if we can help

## 2022-02-14 ENCOUNTER — Other Ambulatory Visit: Payer: Self-pay | Admitting: *Deleted

## 2022-02-14 ENCOUNTER — Ambulatory Visit (INDEPENDENT_AMBULATORY_CARE_PROVIDER_SITE_OTHER): Payer: Medicare Other

## 2022-02-14 DIAGNOSIS — Z952 Presence of prosthetic heart valve: Secondary | ICD-10-CM

## 2022-02-14 DIAGNOSIS — Z5181 Encounter for therapeutic drug level monitoring: Secondary | ICD-10-CM

## 2022-02-14 LAB — POCT INR: INR: 4.9 — AB (ref 2.0–3.0)

## 2022-02-14 MED ORDER — ALBUTEROL SULFATE HFA 108 (90 BASE) MCG/ACT IN AERS: 2.0000 | INHALATION_SPRAY | Freq: Four times a day (QID) | RESPIRATORY_TRACT | 3 refills | Status: DC | PRN

## 2022-02-14 NOTE — Patient Instructions (Signed)
Description   SELF TESTER-Spoke with pt and instructed to HOLD today's dose and eat greens. Then resume taking warfarin 1 tablet daily EXCEPT 1.5 tablet on Mondays, Wednesdays, and Fridays.  Recheck INR in 2 weeks.  Coumadin Clinic [phone]

## 2022-02-15 ENCOUNTER — Telehealth: Payer: Self-pay | Admitting: Internal Medicine

## 2022-02-15 NOTE — Telephone Encounter (Signed)
Called patient and she states that Suzie Portela is not covered by her insurance. Can we run a ticket to see what is covered by her insurance  Please and thank you

## 2022-02-16 ENCOUNTER — Ambulatory Visit (HOSPITAL_COMMUNITY): Payer: Medicare Other | Attending: Cardiovascular Disease

## 2022-02-16 ENCOUNTER — Other Ambulatory Visit: Payer: Self-pay | Admitting: *Deleted

## 2022-02-16 ENCOUNTER — Encounter: Payer: Self-pay | Admitting: Cardiovascular Disease

## 2022-02-16 DIAGNOSIS — Z952 Presence of prosthetic heart valve: Secondary | ICD-10-CM

## 2022-02-16 DIAGNOSIS — I10 Essential (primary) hypertension: Secondary | ICD-10-CM

## 2022-02-16 DIAGNOSIS — E782 Mixed hyperlipidemia: Secondary | ICD-10-CM

## 2022-02-16 DIAGNOSIS — I5189 Other ill-defined heart diseases: Secondary | ICD-10-CM

## 2022-02-16 DIAGNOSIS — I442 Atrioventricular block, complete: Secondary | ICD-10-CM

## 2022-02-16 LAB — ECHOCARDIOGRAM COMPLETE
AR max vel: 1.21 cm2
AV Area VTI: 1.1 cm2
AV Area mean vel: 1.19 cm2
AV Mean grad: 11.6 mmHg
AV Peak grad: 24.1 mmHg
Ao pk vel: 2.45 m/s
Area-P 1/2: 4.14 cm2
S' Lateral: 4.3 cm

## 2022-02-28 ENCOUNTER — Ambulatory Visit (INDEPENDENT_AMBULATORY_CARE_PROVIDER_SITE_OTHER): Payer: Medicare Other

## 2022-02-28 DIAGNOSIS — Z952 Presence of prosthetic heart valve: Secondary | ICD-10-CM | POA: Diagnosis not present

## 2022-02-28 DIAGNOSIS — Z5181 Encounter for therapeutic drug level monitoring: Secondary | ICD-10-CM | POA: Diagnosis not present

## 2022-02-28 LAB — POCT INR: INR: 3.3 — AB (ref 2.0–3.0)

## 2022-02-28 NOTE — Patient Instructions (Signed)
Description   SELF TESTER-Spoke with pt and instructed to continue taking warfarin 1 tablet daily EXCEPT 1.5 tablet on Mondays, Wednesdays, and Fridays.  Recheck INR in 2 weeks.  Coumadin Clinic [phone]

## 2022-03-04 ENCOUNTER — Encounter: Payer: Self-pay | Admitting: Internal Medicine

## 2022-03-04 NOTE — Assessment & Plan Note (Signed)
Discussed potential symptom overlap with her CHF. Refilling inhalers.

## 2022-03-04 NOTE — Assessment & Plan Note (Signed)
Managed by Cardiology and she feels currently well controlled.

## 2022-03-15 ENCOUNTER — Ambulatory Visit (INDEPENDENT_AMBULATORY_CARE_PROVIDER_SITE_OTHER): Payer: Medicare Other

## 2022-03-15 DIAGNOSIS — Z5181 Encounter for therapeutic drug level monitoring: Secondary | ICD-10-CM | POA: Diagnosis not present

## 2022-03-15 LAB — POCT INR: INR: 5 — AB (ref 2.0–3.0)

## 2022-03-15 NOTE — Patient Instructions (Signed)
Description   SELF TESTER-Spoke with pt and instructed to HOLD today's dose and only 0.5 tablet tomorrow and then continue taking warfarin 1 tablet daily EXCEPT 1.5 tablet on Mondays, Wednesdays, and Fridays.  Recheck INR in 1 week.  Coumadin Clinic [phone]

## 2022-03-22 ENCOUNTER — Ambulatory Visit (INDEPENDENT_AMBULATORY_CARE_PROVIDER_SITE_OTHER): Payer: Medicare Other | Admitting: *Deleted

## 2022-03-22 DIAGNOSIS — Z5181 Encounter for therapeutic drug level monitoring: Secondary | ICD-10-CM

## 2022-03-22 DIAGNOSIS — Z952 Presence of prosthetic heart valve: Secondary | ICD-10-CM | POA: Diagnosis not present

## 2022-03-22 LAB — POCT INR: INR: 2.8 (ref 2.0–3.0)

## 2022-03-22 NOTE — Patient Instructions (Addendum)
Description   SELF TESTER-Spoke with pt and instructed to continue taking warfarin 1 tablet daily EXCEPT 1.5 tablets on Mondays, Wednesdays, and Fridays.  Recheck INR in 2 weeks.  Coumadin Clinic [phone]      

## 2022-03-29 ENCOUNTER — Encounter (HOSPITAL_COMMUNITY): Payer: Self-pay | Admitting: *Deleted

## 2022-04-03 ENCOUNTER — Ambulatory Visit: Payer: Medicare Other | Attending: Cardiovascular Disease | Admitting: Cardiovascular Disease

## 2022-04-03 ENCOUNTER — Encounter: Payer: Self-pay | Admitting: Cardiovascular Disease

## 2022-04-03 VITALS — BP 124/78 | HR 63 | Ht 65.0 in | Wt 247.0 lb

## 2022-04-03 DIAGNOSIS — E782 Mixed hyperlipidemia: Secondary | ICD-10-CM

## 2022-04-03 DIAGNOSIS — Z952 Presence of prosthetic heart valve: Secondary | ICD-10-CM

## 2022-04-03 DIAGNOSIS — I442 Atrioventricular block, complete: Secondary | ICD-10-CM

## 2022-04-03 DIAGNOSIS — I480 Paroxysmal atrial fibrillation: Secondary | ICD-10-CM

## 2022-04-03 DIAGNOSIS — I10 Essential (primary) hypertension: Secondary | ICD-10-CM | POA: Diagnosis not present

## 2022-04-03 DIAGNOSIS — I5189 Other ill-defined heart diseases: Secondary | ICD-10-CM

## 2022-04-03 NOTE — Assessment & Plan Note (Signed)
History of hyperlipidemia on statin therapy with lipid profile performed 12/02/2019 revealing a total cholesterol of 174, LDL 54 HDL 99.

## 2022-04-03 NOTE — Patient Instructions (Signed)
Medication Instructions:  Your physician recommends that you continue on your current medications as directed. Please refer to the Current Medication list given to you today.  *If you need a refill on your cardiac medications before your next appointment, please call your pharmacy*   Testing/Procedures: Your physician has requested that you have a carotid duplex. This test is an ultrasound of the carotid arteries in your neck. It looks at blood flow through these arteries that supply the brain with blood. Allow one hour for this exam. There are no restrictions or special instructions. This will take place at [address], [address]. This procedure will be done in June  Your physician has requested that you have a renal artery duplex. During this test, an ultrasound is used to evaluate blood flow to the kidneys. Take your medications as you usually do. This will take place at [address], [address].  No food after 11PM the night before.  Water is OK. (Don't drink liquids if you have been instructed not to for ANOTHER test). Avoid foods that produce bowel gas, for 24 hours prior to exam (see below). No breakfast, no chewing gum, no smoking or carbonated beverages. Patient may take morning medications with water. Come in for test at least 15 minutes early to register. This procedure will be done in June.   Your physician has requested that you have an echocardiogram. Echocardiography is a painless test that uses sound waves to create images of your heart. It provides your doctor with information about the size and shape of your heart and how well your heart's chambers and valves are working. This procedure takes approximately one hour. There are no restrictions for this procedure. Please do NOT wear cologne, perfume, aftershave, or lotions (deodorant is allowed). Please arrive 15 minutes prior to your appointment time. This procedure will be done at [address]. To be  done in December.    Follow-Up: At [facility], you and your health needs are our priority.  As part of our continuing mission to provide you with exceptional heart care, we have created designated Provider Care Teams.  These Care Teams include your primary Cardiologist (physician) and Advanced Practice Providers (APPs -  Physician Assistants and Nurse Practitioners) who all work together to provide you with the care you need, when you need it.  We recommend signing up for the patient portal called "MyChart".  Sign up information is provided on this After Visit Summary.  MyChart is used to connect with patients for Virtual Visits (Telemedicine).  Patients are able to view lab/test results, encounter notes, upcoming appointments, etc.  Non-urgent messages can be sent to your provider as well.   To learn more about what you can do with MyChart, go to [email or web address].    Your next appointment:   6 month(s)  Provider:   Coletta Memos, FNP      Then, Quay Burow, MD will plan to see you again in 12 month(s).

## 2022-04-03 NOTE — Assessment & Plan Note (Signed)
Status post aortic valve replacement with a Saint Jude valve by Dr. [name] September 2016 because of severe AI.  She did have normal coronary arteries by cath prior to that.  Her most recent 2D echo performed 02/16/2022 revealed normal LV systolic function, grade 2 diastolic dysfunction, a well-functioning 23 mm CarboMedics mechanical valve with a peak gradient of 24 mmHg.  She did have moderate to severe MR which is worse than it was by echo 03/10/2021 and moderate pulmonary hypertension with torrential TR.  Will continue to follow this on annual basis.

## 2022-04-03 NOTE — Assessment & Plan Note (Signed)
History of complete heart block status post permanent transvenous pacemaker insertion by Dr. Rayann Heman January 2015.  Will arrange for her to follow-up with pulmonology physiologist.

## 2022-04-03 NOTE — Progress Notes (Signed)
04/03/2022 [name]   March 06, 1950  [id number]  Primary Physician [name], MD Primary Cardiologist: [name] MD FACP, [place], [place], [name]  HPI:  [name] is a 72 y.o.    moderately overweight, almost divorced Caucasian female mother of 76, grandmother to 3 grandchildren who I last saw in the office 07/08/2021.  She had a St. Jude AVR performed by Dr. Ceasar Mons September of 2006 because of severe aortic insufficiency. She had normal coronary arteries by cath at that time. A look at her renal arteries and demonstrated a 60% right renal artery stenosis and patent accessory left renal arteries. By duplex she does have a non-functioning left kidney. Her other problems include hypertension and hyperlipidemia. She also has a history of lupus followed by Dr. Jalene Mullet. An echo performed a year ago showed a well-functioning aortic prosthesis, and recent renal Dopplers revealed a widely patent right renal artery. She had a small bowel obstruction and underwent surgery at [facility] for perforated small bowel. She had an exploratory laparotomy with ileostomy and a right hemicolectomy 04/12/12. She saw [name] PAC back in our office 06/12/12 and her medicines were adjusted.   She was admitted in early January for symptomatic complete heart block underwent permanent pacemaker insertion by Dr. Thompson Grayer.[name] She was remitted approximately week later with orthostatic hypotension and was dehydrated. She responded to fluid resuscitation. Since I saw her a year ago she was admitted with heart failure requiring diuresis secondary to diastolic dysfunction and dietary indiscretion. In addition, she recently underwent an cardiac catheterization by Dr. [name] because of chest pain 04/19/15  revealing normal coronary arteries.  She was admitted to [facility] of volume overload 12/21/15 and was diuresed     She was complaining of increasing dyspnea.  She was found to be in A. fib and was  admitted for Tikosyn load on 04/01/2018.  She converted to sinus rhythm and feels clinically improved.   She  continues to do well ostensibly maintaining sinus rhythm on Tikosyn and warfarin.  She did see Dr. Rayann Heman back who confirmed continued improvement.  She does have diastolic heart failure on diuretics with grade 1 diastolic dysfunction on 2D echo 05/24/2017.  Recent carotid and renal Doppler studies showed an occluded left renal artery with a widely patent right renal artery and an occluded left internal carotid with a widely patent right.   She is followed in the A. fib clinic extensively maintaining sinus rhythm after her Tikosyn load.  Her most recent 2D echo performed 08/26/2019 revealed an EF of 40% with a well-functioning aortic mechanical prosthesis on Coumadin anticoagulation.    Since I saw her in the office 9 months ago she continues to feel well.  She denies chest pain or shortness of breath.  Her 2D echo revealed normal LV systolic function with grade 2 diastolic dysfunction and a well functioning aortic mechanical prosthesis performed 02/16/2022.  She did have moderate to severe MR, moderate pulm hypertension and torrential TR.  She was tried on spironolactone however this caused hyperkalemia and it was discontinued.  Her Delene Loll was also changed to losartan.  Her carotid Dopplers have remained stable with a widely patent right carotid artery.   Current Meds  Medication Sig   acetaminophen (TYLENOL) 325 MG tablet Take 650 mg by mouth every 6 (six) hours as needed for moderate pain.    albuterol (VENTOLIN HFA) 108 (90 Base) MCG/ACT inhaler Inhale 2 puffs into the lungs every  6 (six) hours as needed for wheezing or shortness of breath.   allopurinol (ZYLOPRIM) 100 MG tablet Take 100 mg by mouth daily.    calcium carbonate (OSCAL) 1500 (600 Ca) MG TABS tablet Take 1 tablet by mouth 2 (two) times daily.   cetirizine (ZYRTEC) 10 MG tablet Take 10 mg by mouth at bedtime.   chlorhexidine  (PERIDEX) 0.12 % solution Use as directed 10 mLs in the mouth or throat 2 (two) times daily as needed (before dental appts).    cholecalciferol (VITAMIN D) 1000 UNITS tablet Take 2,000 Units by mouth 2 (two) times daily.    clotrimazole-betamethasone (LOTRISONE) cream as needed.   dicyclomine (BENTYL) 20 MG tablet Take by mouth.   dofetilide (TIKOSYN) 250 MCG capsule Take 1 capsule (250 mcg total) by mouth 2 (two) times daily.   Ferrous Fumarate-Folic Acid (HEMOCYTE-F) 324-1 MG TABS Take 1 tablet by mouth daily.   fluticasone-salmeterol (WIXELA INHUB) 250-50 MCG/ACT AEPB USE 1 INHALATION BY MOUTH  twice daily   furosemide (LASIX) 40 MG tablet Take 1 tablet (40 mg total) by mouth daily.   hydroxychloroquine (PLAQUENIL) 200 MG tablet Take 200 mg by mouth daily.   isosorbide mononitrate (IMDUR) 30 MG 24 hr tablet TAKE 1 TABLET BY MOUTH  DAILY   leflunomide (ARAVA) 10 MG tablet Take 10 mg by mouth daily.   loratadine (CLARITIN) 10 MG tablet Take 10 mg by mouth daily.   losartan (COZAAR) 25 MG tablet Take 1 tablet by mouth once daily   Magnesium 200 MG TABS Take 1 tablet (200 mg total) by mouth daily.   metoprolol succinate (TOPROL-XL) 50 MG 24 hr tablet TAKE 1 TABLET BY MOUTH  DAILY WITH OR IMMEDIATELY  FOLLOWING A MEAL   predniSONE (DELTASONE) 5 MG tablet Take 5 mg by mouth daily.   Probiotic Product (PROBIOTIC DAILY PO) Take 1 tablet by mouth daily.   Saccharomyces boulardii (PROBIOTIC) 250 MG CAPS 1 capsule   simvastatin (ZOCOR) 20 MG tablet Take 1 tablet by mouth at  bedtime   sodium zirconium cyclosilicate (LOKELMA) 10 g PACK packet Take 10 g by mouth 2 (two) times daily.   warfarin (COUMADIN) 5 MG tablet TAKE 1 TO 1 AND 1/2 TABLETS BY MOUTH DAILY AS DIRECTED  BY COUMADIN CLINIC     Allergies  Allergen Reactions   Spironolactone Other (See Comments)    Hyperkalemia    Social History   Socioeconomic History   Marital status: Divorced    Spouse name: Not on file   Number of children:  3   Years of education: Not on file   Highest education level: Not on file  Occupational History   Occupation: Retired  Tobacco Use   Smoking status: Never   Smokeless tobacco: Never  Vaping Use   Vaping Use: Never used  Substance and Sexual Activity   Alcohol use: No   Drug use: No   Sexual activity: Not on file  Other Topics Concern   Not on file  Social History Narrative   Lives alone in [place].   Social Determinants of Health   Financial Resource Strain: Not on file  Food Insecurity: Not on file  Transportation Needs: Not on file  Physical Activity: Not on file  Stress: Not on file  Social Connections: Not on file  Intimate Partner Violence: Not on file     Review of Systems: General: negative for chills, fever, night sweats or weight changes.  Cardiovascular: negative for chest pain, dyspnea on exertion, edema,  orthopnea, palpitations, paroxysmal nocturnal dyspnea or shortness of breath Dermatological: negative for rash Respiratory: negative for cough or wheezing Urologic: negative for hematuria Abdominal: negative for nausea, vomiting, diarrhea, bright red blood per rectum, melena, or hematemesis Neurologic: negative for visual changes, syncope, or dizziness All other systems reviewed and are otherwise negative except as noted above.    Blood pressure 124/78, pulse 63, height 5' 5"$  (1.651 m), weight 247 lb (112 kg).  General appearance: alert and no distress Neck: no adenopathy, no JVD, supple, symmetrical, trachea midline, thyroid not enlarged, symmetric, no tenderness/mass/nodules, and right carotid bruit Lungs: clear to auscultation bilaterally Heart: Crisp mechanical prosthetic heart sounds Extremities: 1-2+ pitting lower extremity edema Pulses: 2+ and symmetric Skin: Skin color, texture, turgor normal. No rashes or lesions Neurologic: Grossly normal  EKG AV sequentially paced rhythm at 63.  I personally reviewed this EKG.  ASSESSMENT AND PLAN:    Complete heart block (HCC) History of complete heart block status post permanent transvenous pacemaker insertion by Dr. Rayann Heman January 2015.  Will arrange for her to follow-up with pulmonology physiologist.  S/P AVR (aortic valve replacement) Status post aortic valve replacement with a Saint Jude valve by Dr. [name] September 2016 because of severe AI.  She did have normal coronary arteries by cath prior to that.  Her most recent 2D echo performed 02/16/2022 revealed normal LV systolic function, grade 2 diastolic dysfunction, a well-functioning 23 mm CarboMedics mechanical valve with a peak gradient of 24 mmHg.  She did have moderate to severe MR which is worse than it was by echo 03/10/2021 and moderate pulmonary hypertension with torrential TR.  Will continue to follow this on annual basis.  Hyperlipidemia History of hyperlipidemia on statin therapy with lipid profile performed 12/02/2019 revealing a total cholesterol of 174, LDL 54 HDL 99.  Essential hypertension History of essential hypertension with blood pressure measured today at 124/78.  She is on losartan and metoprolol.  Diastolic dysfunction History of grade 2 diastolic dysfunction on furosemide with 1-2+ pitting lower extremity edema.  She denies shortness of breath.  PAF (paroxysmal atrial fibrillation) (HCC) History of PAF maintaining sinus rhythm on dofetilide and warfarin oral anticoagulation.     [name] MD FACP,FACC,FAHA, [facility] 04/03/2022 2:40 PM

## 2022-04-03 NOTE — Assessment & Plan Note (Signed)
History of grade 2 diastolic dysfunction on furosemide with 1-2+ pitting lower extremity edema.  She denies shortness of breath.

## 2022-04-03 NOTE — Assessment & Plan Note (Signed)
History of essential hypertension with blood pressure measured today at 124/78.  She is on losartan and metoprolol.

## 2022-04-03 NOTE — Assessment & Plan Note (Signed)
History of PAF maintaining sinus rhythm on dofetilide and warfarin oral anticoagulation.

## 2022-04-05 ENCOUNTER — Ambulatory Visit (INDEPENDENT_AMBULATORY_CARE_PROVIDER_SITE_OTHER): Payer: Medicare Other | Admitting: *Deleted

## 2022-04-05 ENCOUNTER — Other Ambulatory Visit (HOSPITAL_COMMUNITY): Payer: Self-pay | Admitting: Cardiovascular Disease

## 2022-04-05 DIAGNOSIS — Z952 Presence of prosthetic heart valve: Secondary | ICD-10-CM | POA: Diagnosis not present

## 2022-04-05 DIAGNOSIS — Z5181 Encounter for therapeutic drug level monitoring: Secondary | ICD-10-CM | POA: Diagnosis not present

## 2022-04-05 LAB — POCT INR: INR: 2.9 (ref 2.0–3.0)

## 2022-04-05 NOTE — Patient Instructions (Signed)
Description   SELF TESTER-Spoke with pt and instructed to continue taking warfarin 1 tablet daily EXCEPT 1.5 tablets on Mondays, Wednesdays, and Fridays.  Recheck INR in 2 weeks. Coumadin Clinic [phone]

## 2022-04-08 ENCOUNTER — Other Ambulatory Visit: Payer: Self-pay | Admitting: Cardiovascular Disease

## 2022-04-08 DIAGNOSIS — I4729 Other ventricular tachycardia: Secondary | ICD-10-CM

## 2022-04-11 ENCOUNTER — Ambulatory Visit (INDEPENDENT_AMBULATORY_CARE_PROVIDER_SITE_OTHER): Payer: Medicare Other

## 2022-04-11 DIAGNOSIS — I442 Atrioventricular block, complete: Secondary | ICD-10-CM

## 2022-04-11 LAB — CUP PACEART REMOTE DEVICE CHECK
Battery Remaining Longevity: 13 mo
Battery Remaining Percentage: 12 %
Battery Voltage: 2.84 V
Brady Statistic AP VP Percent: 64 %
Brady Statistic AP VS Percent: 1 %
Brady Statistic AS VP Percent: 36 %
Brady Statistic AS VS Percent: 1 %
Brady Statistic RA Percent Paced: 61 %
Brady Statistic RV Percent Paced: 99 %
Date Time Interrogation Session: 20240221020029
Implantable Lead Connection Status: 753985
Implantable Lead Connection Status: 753985
Implantable Lead Implant Date: 20150116
Implantable Lead Implant Date: 20150116
Implantable Lead Location: 753859
Implantable Lead Location: 753860
Implantable Lead Model: 5076
Implantable Pulse Generator Implant Date: 20150116
Lead Channel Impedance Value: 360 Ohm
Lead Channel Impedance Value: 580 Ohm
Lead Channel Pacing Threshold Amplitude: 0.5 V
Lead Channel Pacing Threshold Amplitude: 0.75 V
Lead Channel Pacing Threshold Pulse Width: 0.4 ms
Lead Channel Pacing Threshold Pulse Width: 0.4 ms
Lead Channel Sensing Intrinsic Amplitude: 4.2 mV
Lead Channel Sensing Intrinsic Amplitude: 7.8 mV
Lead Channel Setting Pacing Amplitude: 2 V
Lead Channel Setting Pacing Amplitude: 2.5 V
Lead Channel Setting Pacing Pulse Width: 0.4 ms
Lead Channel Setting Sensing Sensitivity: 2 mV
Pulse Gen Model: 2240
Pulse Gen Serial Number: 7587004

## 2022-04-19 ENCOUNTER — Ambulatory Visit (INDEPENDENT_AMBULATORY_CARE_PROVIDER_SITE_OTHER): Payer: Medicare Other

## 2022-04-19 DIAGNOSIS — Z5181 Encounter for therapeutic drug level monitoring: Secondary | ICD-10-CM | POA: Diagnosis not present

## 2022-04-19 DIAGNOSIS — Z952 Presence of prosthetic heart valve: Secondary | ICD-10-CM | POA: Diagnosis not present

## 2022-04-19 LAB — POCT INR: INR: 3.1 — AB (ref 2.0–3.0)

## 2022-04-19 NOTE — Patient Instructions (Signed)
Description   SELF TESTER-Spoke with pt and instructed to continue taking warfarin 1 tablet daily EXCEPT 1.5 tablets on Mondays, Wednesdays, and Fridays.  Recheck INR in 2 weeks.  Coumadin Clinic [phone]

## 2022-04-22 ENCOUNTER — Other Ambulatory Visit: Payer: Self-pay | Admitting: Cardiovascular Disease

## 2022-04-22 DIAGNOSIS — I5031 Acute diastolic (congestive) heart failure: Secondary | ICD-10-CM

## 2022-05-03 ENCOUNTER — Ambulatory Visit (INDEPENDENT_AMBULATORY_CARE_PROVIDER_SITE_OTHER): Payer: Medicare Other

## 2022-05-03 DIAGNOSIS — Z5181 Encounter for therapeutic drug level monitoring: Secondary | ICD-10-CM | POA: Diagnosis not present

## 2022-05-03 DIAGNOSIS — Z952 Presence of prosthetic heart valve: Secondary | ICD-10-CM

## 2022-05-03 LAB — POCT INR: INR: 2.6 (ref 2.0–3.0)

## 2022-05-03 NOTE — Patient Instructions (Signed)
Description   SELF TESTER-Spoke with pt and instructed to continue taking warfarin 1 tablet daily EXCEPT 1.5 tablets on Mondays, Wednesdays, and Fridays.  Recheck INR in 2 weeks.  Coumadin Clinic [phone]      

## 2022-05-09 NOTE — Progress Notes (Signed)
Remote pacemaker transmission.   

## 2022-05-17 ENCOUNTER — Ambulatory Visit (INDEPENDENT_AMBULATORY_CARE_PROVIDER_SITE_OTHER): Payer: Medicare Other

## 2022-05-17 DIAGNOSIS — Z952 Presence of prosthetic heart valve: Secondary | ICD-10-CM

## 2022-05-17 DIAGNOSIS — Z5181 Encounter for therapeutic drug level monitoring: Secondary | ICD-10-CM | POA: Diagnosis not present

## 2022-05-17 LAB — POCT INR: INR: 2.6 (ref 2.0–3.0)

## 2022-05-17 NOTE — Patient Instructions (Signed)
Description   SELF TESTER-Spoke with pt and instructed to continue taking warfarin 1 tablet daily EXCEPT 1.5 tablets on Mondays, Wednesdays, and Fridays.  Recheck INR in 2 weeks.  Coumadin Clinic [phone]      

## 2022-05-31 ENCOUNTER — Ambulatory Visit (INDEPENDENT_AMBULATORY_CARE_PROVIDER_SITE_OTHER): Payer: Medicare Other | Admitting: *Deleted

## 2022-05-31 ENCOUNTER — Telehealth: Payer: Self-pay | Admitting: *Deleted

## 2022-05-31 DIAGNOSIS — Z5181 Encounter for therapeutic drug level monitoring: Secondary | ICD-10-CM | POA: Diagnosis not present

## 2022-05-31 DIAGNOSIS — Z952 Presence of prosthetic heart valve: Secondary | ICD-10-CM | POA: Diagnosis not present

## 2022-05-31 LAB — POCT INR: INR: 3.4 — AB (ref 2.0–3.0)

## 2022-05-31 NOTE — Patient Instructions (Signed)
Description   SELF TESTER-Spoke with pt and instructed to continue taking warfarin 1 tablet daily EXCEPT 1.5 tablets on Mondays, Wednesdays, and Fridays.  Recheck INR in 2 weeks.  Coumadin Clinic [phone]      

## 2022-05-31 NOTE — Telephone Encounter (Signed)
Called pt since INR is due there was no answer therefore left her a message to call back.

## 2022-06-05 ENCOUNTER — Other Ambulatory Visit: Payer: Self-pay | Admitting: Cardiovascular Disease

## 2022-06-05 DIAGNOSIS — I480 Paroxysmal atrial fibrillation: Secondary | ICD-10-CM

## 2022-06-14 ENCOUNTER — Ambulatory Visit (INDEPENDENT_AMBULATORY_CARE_PROVIDER_SITE_OTHER): Payer: Medicare Other | Admitting: Cardiology

## 2022-06-14 DIAGNOSIS — I4819 Other persistent atrial fibrillation: Secondary | ICD-10-CM | POA: Diagnosis not present

## 2022-06-14 DIAGNOSIS — Z5181 Encounter for therapeutic drug level monitoring: Secondary | ICD-10-CM | POA: Diagnosis not present

## 2022-06-14 LAB — POCT INR: INR: 2.6 (ref 2.0–3.0)

## 2022-06-14 NOTE — Patient Instructions (Signed)
Description   SELF TESTER-Spoke with pt and instructed to continue taking warfarin 1 tablet daily EXCEPT 1.5 tablets on Mondays, Wednesdays, and Fridays.  Recheck INR in 2 weeks.  Coumadin Clinic [phone]      

## 2022-06-28 ENCOUNTER — Ambulatory Visit (INDEPENDENT_AMBULATORY_CARE_PROVIDER_SITE_OTHER): Payer: Medicare Other

## 2022-06-28 DIAGNOSIS — Z952 Presence of prosthetic heart valve: Secondary | ICD-10-CM | POA: Diagnosis not present

## 2022-06-28 DIAGNOSIS — Z5181 Encounter for therapeutic drug level monitoring: Secondary | ICD-10-CM

## 2022-06-28 LAB — POCT INR: INR: 4.8 — AB (ref 2.0–3.0)

## 2022-06-28 NOTE — Patient Instructions (Signed)
Description   SELF TESTER-Spoke with pt and instructed to HOLD today's dose and eat greens and then continue taking warfarin 1 tablet daily EXCEPT 1.5 tablets on Mondays, Wednesdays, and Fridays.  Recheck INR in 2 weeks.  Coumadin Clinic [phone]

## 2022-06-29 ENCOUNTER — Other Ambulatory Visit: Payer: Self-pay | Admitting: Cardiovascular Disease

## 2022-07-05 ENCOUNTER — Other Ambulatory Visit: Payer: Self-pay

## 2022-07-05 ENCOUNTER — Emergency Department (HOSPITAL_COMMUNITY): Payer: Medicare Other

## 2022-07-05 ENCOUNTER — Emergency Department (HOSPITAL_COMMUNITY)
Admission: EM | Admit: 2022-07-05 | Discharge: 2022-07-05 | Disposition: A | Discharge status: Home / Self Care | Payer: Medicare Other | Attending: Emergency Medicine | Admitting: Emergency Medicine

## 2022-07-05 ENCOUNTER — Telehealth: Payer: Self-pay | Admitting: Student

## 2022-07-05 ENCOUNTER — Encounter (HOSPITAL_COMMUNITY): Payer: Self-pay

## 2022-07-05 DIAGNOSIS — R778 Other specified abnormalities of plasma proteins: Secondary | ICD-10-CM | POA: Diagnosis not present

## 2022-07-05 DIAGNOSIS — R002 Palpitations: Secondary | ICD-10-CM | POA: Insufficient documentation

## 2022-07-05 DIAGNOSIS — Z950 Presence of cardiac pacemaker: Secondary | ICD-10-CM | POA: Diagnosis not present

## 2022-07-05 DIAGNOSIS — R7989 Other specified abnormal findings of blood chemistry: Secondary | ICD-10-CM | POA: Diagnosis not present

## 2022-07-05 DIAGNOSIS — R0602 Shortness of breath: Secondary | ICD-10-CM | POA: Insufficient documentation

## 2022-07-05 DIAGNOSIS — Z79899 Other long term (current) drug therapy: Secondary | ICD-10-CM | POA: Diagnosis not present

## 2022-07-05 DIAGNOSIS — I4719 Other supraventricular tachycardia: Secondary | ICD-10-CM | POA: Diagnosis not present

## 2022-07-05 DIAGNOSIS — M7989 Other specified soft tissue disorders: Secondary | ICD-10-CM | POA: Diagnosis not present

## 2022-07-05 LAB — CBC WITH DIFFERENTIAL/PLATELET
Abs Immature Granulocytes: 0.02 10*3/uL (ref 0.00–0.07)
Basophils Absolute: 0.1 10*3/uL (ref 0.0–0.1)
Basophils Relative: 1 %
Eosinophils Absolute: 0.1 10*3/uL (ref 0.0–0.5)
Eosinophils Relative: 2 %
HCT: 38.8 % (ref 36.0–46.0)
Hemoglobin: 12 g/dL (ref 12.0–15.0)
Immature Granulocytes: 0 %
Lymphocytes Relative: 12 %
Lymphs Abs: 0.8 10*3/uL (ref 0.7–4.0)
MCH: 29.9 pg (ref 26.0–34.0)
MCHC: 30.9 g/dL (ref 30.0–36.0)
MCV: 96.8 fL (ref 80.0–100.0)
Monocytes Absolute: 0.7 10*3/uL (ref 0.1–1.0)
Monocytes Relative: 10 %
Neutro Abs: 4.9 10*3/uL (ref 1.7–7.7)
Neutrophils Relative %: 75 %
Platelets: 182 10*3/uL (ref 150–400)
RBC: 4.01 MIL/uL (ref 3.87–5.11)
RDW: 15.5 % (ref 11.5–15.5)
WBC: 6.5 10*3/uL (ref 4.0–10.5)
nRBC: 0 % (ref 0.0–0.2)

## 2022-07-05 LAB — TROPONIN I (HIGH SENSITIVITY)
Troponin I (High Sensitivity): 27 ng/L — ABNORMAL HIGH (ref <18)
Troponin I (High Sensitivity): 31 ng/L — ABNORMAL HIGH (ref <18)

## 2022-07-05 LAB — BASIC METABOLIC PANEL
Anion gap: 15 (ref 5–15)
BUN: 32 mg/dL — ABNORMAL HIGH (ref 8–23)
CO2: 25 mmol/L (ref 22–32)
Calcium: 9.4 mg/dL (ref 8.9–10.3)
Chloride: 101 mmol/L (ref 98–111)
Creatinine, Ser: 1.37 mg/dL — ABNORMAL HIGH (ref 0.44–1.00)
GFR, Estimated: 42 mL/min — ABNORMAL LOW (ref >60)
Glucose, Bld: 119 mg/dL — ABNORMAL HIGH (ref 70–99)
Potassium: 3.6 mmol/L (ref 3.5–5.1)
Sodium: 141 mmol/L (ref 135–145)

## 2022-07-05 NOTE — ED Provider Triage Note (Signed)
Emergency Medicine Provider Triage Evaluation Note  [name] , a 72 y.o. female  was evaluated in triage.  Pt complains of palpitations.  Patient states that she was asleep and woke up with feelings of her heart racing.  States episode lasted approximately 3 minutes.  Patient with history of ICD placement.  Denies any current symptoms.  States that her cardiologist was unable to see her until Tuesday of next week.  Review of Systems  Positive: See above Negative:   Physical Exam  BP (!) 160/80   Pulse 86   Temp 98.2 F (36.8 C) (Oral)   Resp 19   SpO2 100%  Gen:   Awake, no distress   Resp:  Normal effort  MSK:   Moves extremities without difficulty  Other:    Medical Decision Making  Medically screening exam initiated at 1:14 PM.  Appropriate orders placed.  FRANKY DEBARGE was informed that the remainder of the evaluation will be completed by another provider, this initial triage assessment does not replace that evaluation, and the importance of remaining in the ED until their evaluation is complete.     Peter Garter, [name] 07/05/22 1355

## 2022-07-05 NOTE — Discharge Instructions (Signed)
Follow-up with cardiology on May 30th at 820 in the morning, call to double check. Return for new concerns.

## 2022-07-05 NOTE — Telephone Encounter (Signed)
Remote transmission received and reviewed. No new episodes since 06/27/2022. Previous events appear AT with longest in duration 3 minutes 14 seconds.

## 2022-07-05 NOTE — Telephone Encounter (Signed)
Pt stated this morning around 0930 she woke up with rapid HR and shortness of breath. Pt stated she has experienced rapid HR off and on for a month. She has shortness of breath at baseline, however it has worsen this morning, and wakes up with a headache for the past week. She has BLE swelling at baseline. Advised pt to send transmission, will forward message to device clinic, MD and nurse for advise.

## 2022-07-05 NOTE — ED Triage Notes (Signed)
Pt came in via POV d/t heart palpitations that started this morning & after transmitting (she has a pacemaker) a reading to her provider. He was unable to make her a sooner appointment that was needed so she is here for eval since they saw she had a "3 minute episode" of fast heart rate. Does have Hx of A-Fib. A/Ox4, denies any pain.

## 2022-07-05 NOTE — Telephone Encounter (Signed)
Patient c/o Palpitations:  High priority if patient c/o lightheadedness, shortness of breath, or chest pain  How long have you had palpitations/irregular HR/ Afib? Are you having the symptoms now? This morning 30 minutes ago, yes but not as bad  Are you currently experiencing lightheadedness, SOB or CP? no  Do you have a history of afib (atrial fibrillation) or irregular heart rhythm? yes  Have you checked your BP or HR? (document readings if available): no way to check it   Are you experiencing any other symptoms? "Feels funny"   Patient states this morning about 30 minutes ago she had a very intense racing and pounding of her heart. She says it lasted 5-10 minutes and she is not sure if her pacemaker caught any results. She says the last time she went to afib clinic she says they told her it has been pretty good. She says for a month or so it has been racing, but this last time it was a lot worse.

## 2022-07-05 NOTE — Telephone Encounter (Signed)
Spoke to the patient explained the results of her remote transmission:   Remote transmission received and reviewed. No new episodes since 06/27/2022. Previous events appear AT with longest in duration 3 minutes 14 seconds    Patient stated "I feel something isn't right " . Pt stated she will go to the emergency room for further evaluation. Will forward to MD and nurse.

## 2022-07-05 NOTE — Consult Note (Signed)
Cardiology Consultation   Patient ID: [name] MRN: [id number]; DOB: [birth year]  Admit date: 07/05/2022 Date of Consult: 07/05/2022  PCP:  Harvie Heck, MD   [facility] Providers Cardiologist:  Nanetta Batty, MD  Electrophysiologist:  Hillis Range, MD (Inactive)       Patient Profile:   [name] is a 72 y.o. female with a hx of CHB s/p pacemaker, atrial fibrillation, AR s/p AVR 2006, hypertension, hyperlipidemia who is being seen 07/05/2022 for the evaluation of atrial tachycardia at the request of Dr. Jodi Mourning.  History of Present Illness:   [name] woke up this AM with palpitations. Episode lasted about 3 minutes.   Per EP reviewed today, " Remote transmission received and reviewed. No new episodes since 06/27/2022. Previous events appear AT with longest in duration 3 minutes 14 seconds    She didn't feel at baseline and presented to the ER for further evaluation. BMET and CBC at baseline. HsTn has been 27-> 31. CXR without edema.  She tells me that she has intermittent palpitations, but this morning around 9:30 it woke her from sleep. Lasted about 3-4 minutes, but even after it ceased she did not feel like herself.  She called the clinic, and they interrogated her device remotely.  While she was waiting to hear back from the clinic, she took a shower, wash some dishes, and did not have a recurrence of her symptoms.  When the clinic called and told her that she had an episode of atrial tachycardia that lasted approximately 3 minutes, she then said that she still did not feel like herself, and she presented to the ER.  Since arriving in the ER, she has had no further symptoms.  She now feels back to her baseline.  She has been up and walking to the bathroom without issues.  During the event she denies chest pain or heaviness, shortness of breath, nausea, or diaphoresis.  She has had no syncope.  We reviewed her ER workup to date, which was largely unremarkable.  She  did have mildly elevated troponin, but she has had prior cardiac catheterizations with normal coronary arteries, and she has been asymptomatic.   Past Medical History:  Diagnosis Date   Antiphospholipid antibody positive 06/28/2017   Asthma    CAD (coronary artery disease)    a. normal cors by cath in 03/2015   Carotid artery disease (HCC)    Chronic anticoagulation    Complete heart block (HCC) 02/2013   a. Implantation of a dual chamber pacemaker in 02/2013 by Dr Johney Frame. STJ model number 2240 pacemaker with model number 2088 right ventricular lead.    Dyslipidemia    H/O Doppler ultrasound 10/03/2010   carotid duplex - R ICA normal patency; L CCA-ICA bypass gradt patent common to interal cartoid bypass graft    H/O mechanical aortic valve replacement    History of echocardiogram 11/08/2011   EF >55%; mild-mod concentric LVH; trace aortic regurg.; LA mild-mod dilated   History of nuclear stress test 01/20/2010   normal pattern of perfusion; low risk scan   Hypertension    Lupus (systemic lupus erythematosus) (HCC)    PVD (peripheral vascular disease) (HCC)    60% R renal artery stenosis; non-functioning L kidney    Past Surgical History:  Procedure Laterality Date   AORTIC VALVE REPLACEMENT  11/29/006   [facility]hardt; [employer]; on coumadin   CARDIAC CATHETERIZATION  11/01/2004   define anatomy    CARDIAC CATHETERIZATION N/A 04/19/2015  Procedure: Coronary Angiography;  Surgeon: Peter M Swaziland, MD;  Location: [facility] INVASIVE CV LAB;  Service: Cardiovascular;  Laterality: N/A;   CAROTID ENDARTERECTOMY     lt.   CHOLECYSTECTOMY  1999   EXPLORATORY LAPAROTOMY  04/12/2012   small bowel perf; ileostomy & R hemicolectomy    PACEMAKER INSERTION  03/06/2013   SJM Assurity DR PPM implanted by Dr Johney Frame for complete heart block   PERMANENT PACEMAKER INSERTION N/A 03/06/2013   Procedure: PERMANENT PACEMAKER INSERTION;  Surgeon: Gardiner Rhyme, MD;  Location: [facility] CATH LAB;  Service: Cardiovascular;   Laterality: N/A;   TEMPORARY PACEMAKER INSERTION N/A 03/06/2013   Procedure: TEMPORARY PACEMAKER INSERTION;  Surgeon: Gardiner Rhyme, MD;  Location: [facility] CATH LAB;  Service: Cardiovascular;  Laterality: N/A;   TUBAL LIGATION  11/1982     Home Medications:  Prior to Admission medications   Medication Sig Start Date End Date Taking? Authorizing Provider  acetaminophen (TYLENOL) 325 MG tablet Take 650 mg by mouth every 6 (six) hours as needed for moderate pain.     [provider]  albuterol (VENTOLIN HFA) 108 (90 Base) MCG/ACT inhaler Inhale 2 puffs into the lungs every 6 (six) hours as needed for wheezing or shortness of breath. 02/14/22   Jetty Duhamel D, MD  allopurinol (ZYLOPRIM) 100 MG tablet Take 100 mg by mouth daily.     [provider]  calcium carbonate (OSCAL) 1500 (600 Ca) MG TABS tablet Take 1 tablet by mouth 2 (two) times daily.    [provider]  cetirizine (ZYRTEC) 10 MG tablet Take 10 mg by mouth at bedtime.    [provider]  chlorhexidine (PERIDEX) 0.12 % solution Use as directed 10 mLs in the mouth or throat 2 (two) times daily as needed (before dental appts).  03/31/15   [provider]  cholecalciferol (VITAMIN D) 1000 UNITS tablet Take 2,000 Units by mouth 2 (two) times daily.     [provider]  clotrimazole-betamethasone (LOTRISONE) cream as needed. 11/04/18   [provider]  dicyclomine (BENTYL) 20 MG tablet Take by mouth. 07/15/19   [provider]  dofetilide (TIKOSYN) 250 MCG capsule Take 1 capsule by mouth twice daily 04/05/22   Runell Gess, MD  Ferrous Fumarate-Folic Acid (HEMOCYTE-F) 324-1 MG TABS Take 1 tablet by mouth daily. 09/20/20   [provider]  fluticasone-salmeterol [facility] INHUB) 250-50 MCG/ACT AEPB USE 1 INHALATION BY MOUTH  twice daily 02/05/22   Jetty Duhamel D, MD  furosemide (LASIX) 40 MG tablet TAKE 1 TABLET BY MOUTH DAILY 04/23/22   Runell Gess, MD   hydroxychloroquine (PLAQUENIL) 200 MG tablet Take 200 mg by mouth daily.    [provider]  isosorbide mononitrate (IMDUR) 30 MG 24 hr tablet TAKE 1 TABLET BY MOUTH DAILY 07/02/22   Runell Gess, MD  leflunomide (ARAVA) 10 MG tablet Take 10 mg by mouth daily.    [provider]  loratadine (CLARITIN) 10 MG tablet Take 10 mg by mouth daily.    [provider]  losartan (COZAAR) 25 MG tablet Take 1 tablet by mouth once daily 01/05/22   Runell Gess, MD  Magnesium 200 MG TABS Take 1 tablet (200 mg total) by mouth daily. 04/15/18   Newman Nip, NP  metoprolol succinate (TOPROL-XL) 50 MG 24 hr tablet TAKE 1 TABLET BY MOUTH DAILY  WITH OR IMMEDIATELY FOLLOWING A  MEAL 04/09/22   Runell Gess, MD  predniSONE (DELTASONE) 5 MG  tablet Take 5 mg by mouth daily.    [provider]  Probiotic Product (PROBIOTIC DAILY PO) Take 1 tablet by mouth daily.    [provider]  Saccharomyces boulardii (PROBIOTIC) 250 MG CAPS 1 capsule    [provider]  simvastatin (ZOCOR) 20 MG tablet Take 1 tablet by mouth at  bedtime 06/07/15   Runell Gess, MD  sodium zirconium cyclosilicate (LOKELMA) 10 g PACK packet Take 10 g by mouth 2 (two) times daily. 01/10/21   Hillis Range, MD  warfarin (COUMADIN) 5 MG tablet TAKE 1 TO 1 AND 1/2 TABLETS BY  MOUTH DAILY AS DIRECTED BY  COUMADIN CLINIC 06/05/22   Runell Gess, MD     Allergies:    Allergies  Allergen Reactions   Spironolactone Other (See Comments)    Hyperkalemia    Social History:   Social History   Socioeconomic History   Marital status: Divorced    Spouse name: Not on file   Number of children: 3   Years of education: Not on file   Highest education level: Not on file  Occupational History   Occupation: Retired  Tobacco Use   Smoking status: Never   Smokeless tobacco: Never  Vaping Use   Vaping Use: Never used  Substance and Sexual Activity   Alcohol use: No   Drug  use: No   Sexual activity: Not on file  Other Topics Concern   Not on file  Social History Narrative   Lives alone in [place].   Social Determinants of Health   Financial Resource Strain: Not on file  Food Insecurity: Not on file  Transportation Needs: Not on file  Physical Activity: Not on file  Stress: Not on file  Social Connections: Not on file  Intimate Partner Violence: Not on file    Family History:    Family History  Problem Relation Age of Onset   Alzheimer's disease Father    Cirrhosis Father    Cancer Father        prostate   Heart attack Neg Hx      ROS:  Please see the history of present illness.  Constitutional: Negative for chills, fever, night sweats, unintentional weight loss  HENT: Negative for ear pain and hearing loss.   Eyes: Negative for loss of vision and eye pain.  Respiratory: Negative for cough, sputum, wheezing.   Cardiovascular: See HPI. Gastrointestinal: Negative for abdominal pain, melena, and hematochezia.  Genitourinary: Negative for dysuria and hematuria.  Musculoskeletal: Negative for falls and myalgias.  Skin: Negative for itching and rash.  Neurological: Negative for focal weakness, focal sensory changes and loss of consciousness.  Endo/Heme/Allergies: Does not bruise/bleed easily.   All other ROS reviewed and negative.     Physical Exam/Data:   Vitals:   07/05/22 1600 07/05/22 1608 07/05/22 1634 07/05/22 1645  BP: (!) 144/76   134/61  Pulse:    72  Resp: (!) 21   18  Temp:   98.4 F (36.9 C)   TempSrc:      SpO2:    100%  Weight:  112 kg    Height:  5\' 5"  (1.651 m)     No intake or output data in the 24 hours ending 07/05/22 1702    07/05/2022    4:08 PM 04/03/2022    1:41 PM 02/05/2022    2:00 PM  Last 3 Weights  Weight (lbs) 246 lb 14.6 oz 247 lb 245 lb 3.2 oz  Weight (  kg) 112 kg 112.038 kg 111.222 kg     Body mass index is 41.09 kg/m.  GEN: Well nourished, well developed in no acute distress NECK: No  JVD CARDIAC: regular rhythm, normal S1 and S2, no rubs or gallops. Crisp metallic S2. 2/6 holosystolic murmur. VASCULAR: Radial pulses 2+ bilaterally.  RESPIRATORY:  Clear to auscultation without rales, wheezing or rhonchi  ABDOMEN: Soft, non-tender, non-distended MUSCULOSKELETAL:  Moves all 4 limbs independently SKIN: Warm and dry, no edema NEUROLOGIC:  No focal neuro deficits noted. PSYCHIATRIC:  Normal affect    EKG:  The EKG was personally reviewed and demonstrates:  sinus rhythm at 80 bpm with a sensed, v paced rhythm Telemetry:  Telemetry was personally reviewed and demonstrates:  SR, a sensed v paced, no arrhythmia  Relevant CV Studies: Cardiac Studies & Procedures   CARDIAC CATHETERIZATION  CARDIAC CATHETERIZATION 04/19/2015  Narrative 1. Normal coronary anatomy. 2. Normal prosthetic valve disc movement by fluoro.  Findings Coronary Findings Diagnostic  Dominance: Right  Left Main Vessel was injected. Vessel is normal in caliber. Vessel is angiographically normal.  Left Anterior Descending Vessel was injected. Vessel is small. Vessel is angiographically normal.  First Diagonal Branch The vessel is large in size.  Second Diagonal Branch The vessel is large in size.  Left Circumflex Vessel was injected. Vessel is normal in caliber. Vessel is angiographically normal.  Right Coronary Artery Vessel was injected. Vessel is large. Vessel is angiographically normal.  Intervention  No interventions have been documented.   STRESS TESTS  NM MYOCAR MULTI W/SPECT W 08/26/2012   ECHOCARDIOGRAM  ECHOCARDIOGRAM COMPLETE 02/16/2022  Narrative ECHOCARDIOGRAM REPORT    Patient Name:   CHERISA LITTLEPAGE Date of Exam: 02/16/2022 Medical Rec #:  [id number]     Height:       65.0 in Accession #:    [id number]    Weight:       245.2 lb Date of Birth:  [birth year]      BSA:          2.157 m Patient Age:    71 years      BP:           122/80 mmHg Patient Gender: F              HR:           63 bpm. Exam Location:  [address]  Procedure: 2D Echo, Cardiac Doppler and Color Doppler  Indications:    S/p AVR Z95.2  History:        Patient has prior history of Echocardiogram examinations, most recent 03/14/2021. CHF, Pacemaker, S/p AVR (23mm Carbomedics mechanical), Arrythmias:Complete heart block; Risk Factors:Hypertension, Dyslipidemia and Obesity. Aortic Valve: 23 mm CarboMedics Mechanical valve is present in the aortic position.  Sonographer:    Samule Ohm RDCS Referring Phys: [phone] JONATHAN J BERRY   Sonographer Comments: Technically difficult study due to poor echo windows and patient is obese. Image acquisition challenging due to patient body habitus. IMPRESSIONS   1. Left ventricular ejection fraction, by estimation, is 50 to 55%. Left ventricular ejection fraction by PLAX is 52 %. The left ventricle has low normal function. The left ventricle demonstrates regional wall motion abnormalities (see scoring diagram/findings for description). There is severe concentric left ventricular hypertrophy. Left ventricular diastolic parameters are consistent with Grade II diastolic dysfunction (pseudonormalization). Elevated left ventricular end-diastolic pressure. 2. Right ventricular systolic function is moderately reduced. The right ventricular size is moderately enlarged. There is mildly elevated pulmonary artery  systolic pressure. The estimated right ventricular systolic pressure is 40.8 mmHg. 3. Left atrial size was moderately dilated. 4. Right atrial size was severely dilated. 5. The mitral valve is abnormal. Moderate to severe mitral valve regurgitation. Moderate mitral annular calcification. 6. The tricuspid valve is abnormal. Tricuspid valve regurgitation is torrential. 7. The aortic valve has been repaired/replaced. Aortic valve regurgitation is trivial. There is a 23 mm CarboMedics Mechanical valve present in the aortic position. Aortic valve area, by  VTI measures 1.10 cm. Aortic valve mean gradient measures 11.6 mmHg. Aortic valve Vmax measures 2.45 m/s. Peak gradient 24.1 mmHg, DI 0.29. 8. The inferior vena cava is dilated in size with <50% respiratory variability, suggesting right atrial pressure of 15 mmHg.  Comparison(s): Changes from prior study are noted. 03/14/2021: LVEF 50-55%, AVR with 9 mmHg mean gradient.  FINDINGS Left Ventricle: Left ventricular ejection fraction, by estimation, is 50 to 55%. Left ventricular ejection fraction by PLAX is 52 %. The left ventricle has low normal function. The left ventricle demonstrates regional wall motion abnormalities. The left ventricular internal cavity size was normal in size. There is severe concentric left ventricular hypertrophy. Abnormal (paradoxical) septal motion, consistent with RV pacemaker. Left ventricular diastolic parameters are consistent with Grade II diastolic dysfunction (pseudonormalization). Elevated left ventricular end-diastolic pressure.  Right Ventricle: The right ventricular size is moderately enlarged. No increase in right ventricular wall thickness. Right ventricular systolic function is moderately reduced. There is mildly elevated pulmonary artery systolic pressure. The tricuspid regurgitant velocity is 2.54 m/s, and with an assumed right atrial pressure of 15 mmHg, the estimated right ventricular systolic pressure is 40.8 mmHg.  Left Atrium: Left atrial size was moderately dilated.  Right Atrium: Right atrial size was severely dilated.  Pericardium: There is no evidence of pericardial effusion.  Mitral Valve: The mitral valve is abnormal. There is mild calcification of the posterior mitral valve leaflet(s). Moderate mitral annular calcification. Moderate to severe mitral valve regurgitation.  Tricuspid Valve: The tricuspid valve is abnormal. Tricuspid valve regurgitation is torrential.  Aortic Valve: The aortic valve has been repaired/replaced. Aortic valve  regurgitation is trivial. Aortic valve mean gradient measures 11.6 mmHg. Aortic valve peak gradient measures 24.1 mmHg. Aortic valve area, by VTI measures 1.10 cm. There is a 23 mm CarboMedics Mechanical valve present in the aortic position.  Pulmonic Valve: The pulmonic valve was normal in structure. Pulmonic valve regurgitation is not visualized.  Aorta: The aortic root and ascending aorta are structurally normal, with no evidence of dilitation.  Venous: The inferior vena cava is dilated in size with less than 50% respiratory variability, suggesting right atrial pressure of 15 mmHg.  IAS/Shunts: There is right bowing of the interatrial septum, suggestive of elevated left atrial pressure. No atrial level shunt detected by color flow Doppler.  Additional Comments: A device lead is visualized.   LEFT VENTRICLE PLAX 2D LV EF:         Left            Diastology ventricular     LV e' medial:    5.73 cm/s ejection        LV E/e' medial:  16.4 fraction by     LV e' lateral:   8.96 cm/s PLAX is 52      LV E/e' lateral: 10.5 %. LVIDd:         5.90 cm LVIDs:         4.30 cm LV PW:         1.40  cm LV IVS:        1.80 cm LVOT diam:     2.20 cm LV SV:         57 LV SV Index:   26 LVOT Area:     3.80 cm   RIGHT VENTRICLE            IVC RV S prime:     7.25 cm/s  IVC diam: 2.90 cm TAPSE (M-mode): 2.1 cm RVSP:           28.8 mmHg  LEFT ATRIUM             Index        RIGHT ATRIUM           Index LA diam:        5.10 cm 2.36 cm/m   RA Pressure: 3.00 mmHg LA Vol (A2C):   80.9 ml 37.50 ml/m  RA Area:     35.40 cm LA Vol (A4C):   95.8 ml 44.41 ml/m  RA Volume:   147.00 ml 68.15 ml/m LA Biplane Vol: 95.6 ml 44.32 ml/m AORTIC VALVE AV Area (Vmax):    1.21 cm AV Area (Vmean):   1.19 cm AV Area (VTI):     1.10 cm AV Vmax:           245.40 cm/s AV Vmean:          157.200 cm/s AV VTI:            0.519 m AV Peak Grad:      24.1 mmHg AV Mean Grad:      11.6 mmHg LVOT Vmax:          78.20 cm/s LVOT Vmean:        49.267 cm/s LVOT VTI:          0.150 m LVOT/AV VTI ratio: 0.29  AORTA Ao Root diam: 3.40 cm Ao Asc diam:  3.30 cm  MITRAL VALVE               TRICUSPID VALVE MV Area (PHT): 4.14 cm    TR Peak grad:   25.8 mmHg MV Decel Time: 183 msec    TR Vmax:        254.00 cm/s MV E velocity: 94.16 cm/s  Estimated RAP:  3.00 mmHg MV A velocity: 65.50 cm/s  RVSP:           28.8 mmHg MV E/A ratio:  1.44 SHUNTS Systemic VTI:  0.15 m Systemic Diam: 2.20 cm  Zoila Shutter MD Electronically signed by Zoila Shutter MD Signature Date/Time: 02/16/2022/2:44:28 PM    Final              Laboratory Data:  High Sensitivity Troponin:   Recent Labs  Lab 07/05/22 1311 07/05/22 1455  TROPONINIHS 27* 31*     Chemistry Recent Labs  Lab 07/05/22 1311  NA 141  K 3.6  CL 101  CO2 25  GLUCOSE 119*  BUN 32*  CREATININE 1.37*  CALCIUM 9.4  GFRNONAA 42*  ANIONGAP 15    No results for input(s): "PROT", "ALBUMIN", "AST", "ALT", "ALKPHOS", "BILITOT" in the last 168 hours. Lipids No results for input(s): "CHOL", "TRIG", "HDL", "LABVLDL", "LDLCALC", "CHOLHDL" in the last 168 hours.  Hematology Recent Labs  Lab 07/05/22 1311  WBC 6.5  RBC 4.01  HGB 12.0  HCT 38.8  MCV 96.8  MCH 29.9  MCHC 30.9  RDW 15.5  PLT 182   Thyroid No results for input(s): "TSH", "FREET4"  in the last 168 hours.  BNPNo results for input(s): "BNP", "PROBNP" in the last 168 hours.  DDimer No results for input(s): "DDIMER" in the last 168 hours.   Radiology/Studies:  DG Chest 1 View  Result Date: 07/05/2022 CLINICAL DATA:  Shortness of breath, palpitations EXAM: CHEST  1 VIEW COMPARISON:  09/08/2018 FINDINGS: Dual lead pacer remains in place. Prior median sternotomy and aortic valve prosthesis. Atherosclerotic calcification of the aortic arch. Mild enlargement of the cardiopericardial silhouette. The lungs appear clear.  No blunting of the costophrenic angles. Right shoulder arthropathy  with flattening of the right humeral head and associated sclerosis. IMPRESSION: 1. Mild stable prominence of the cardiopericardial silhouette, without edema. 2. Prior median sternotomy and aortic valve prosthesis. 3. Right shoulder arthropathy with flattening of the right humeral head and associated sclerosis. Electronically Signed   By: Gaylyn Rong M.D.   On: 07/05/2022 13:44     Assessment and Plan:   Paroxysmal tachycardia Paroxysmal atrial fibrillation CHB s/p PPM -on tikosyn, metoprolol, and warfarin -symptoms now resolved -device interrogated, noted 3 minutes of atrial tachycardia -continue current medications  AR s/p AVR 2006 -23 mm carbomedics mechanical valve -on warfarin  Hypertension Chronic diastolic heart failure -at baseline  Elevated troponins -suspect 2/2 tachycardia, though only 3 min episode. No chest pain, prior normal coronary arteries -reviewed red flags/ER precautions  As her symptoms were brief and now resolved, she is amenable to ER discharge with cardiology follow-up.  We reviewed red flag signs and symptoms that require immediate medical attention.  She will continue on her current medications.  Risk Assessment/Risk Scores:          CHA2DS2-VASc Score = 4   This indicates a 4.8% annual risk of stroke. The patient's score is based upon: CHF History: 1 HTN History: 1 Diabetes History: 0 Stroke History: 0 Vascular Disease History: 0 Age Score: 1 Gender Score: 1      For questions or updates, please contact [facility] Please consult [email or web address] for contact info under    Signed, Jodelle Red, MD  07/05/2022 5:02 PM

## 2022-07-05 NOTE — ED Provider Notes (Signed)
[facility] [place] Provider Note   CSN: [id number] Arrival date & time: 07/05/22  1230     History  Chief Complaint  Patient presents with   Palpitations    [name] is a 72 y.o. female.  Patient presents after episode of heart palpitation and racing sensation this morning around 9:00.  Patient was called by provider saying she had an episode of fast heart rate lasting about 3-1/2 minutes.  Patient unsure what rhythm.  Patient has history of A-fib.  No change in medications.  No fevers chills or chest pain recently.  Patient felt tired afterwards and mild shortness of breath but she regularly feels a little winded.  Currently no heart racing sensation.  Patient follows with Dr. Gery Pray cardiology.  Patient history of valve replacement and pacemaker.       Home Medications Prior to Admission medications   Medication Sig Start Date End Date Taking? Authorizing Provider  acetaminophen (TYLENOL) 325 MG tablet Take 650 mg by mouth every 6 (six) hours as needed for moderate pain.     [provider]  albuterol (VENTOLIN HFA) 108 (90 Base) MCG/ACT inhaler Inhale 2 puffs into the lungs every 6 (six) hours as needed for wheezing or shortness of breath. 02/14/22   Jetty Duhamel D, MD  allopurinol (ZYLOPRIM) 100 MG tablet Take 100 mg by mouth daily.     [provider]  calcium carbonate (OSCAL) 1500 (600 Ca) MG TABS tablet Take 1 tablet by mouth 2 (two) times daily.    [provider]  cetirizine (ZYRTEC) 10 MG tablet Take 10 mg by mouth at bedtime.    [provider]  chlorhexidine (PERIDEX) 0.12 % solution Use as directed 10 mLs in the mouth or throat 2 (two) times daily as needed (before dental appts).  03/31/15   [provider]  cholecalciferol (VITAMIN D) 1000 UNITS tablet Take 2,000 Units by mouth 2 (two) times daily.     [provider]  clotrimazole-betamethasone (LOTRISONE) cream as needed.  11/04/18   [provider]  dicyclomine (BENTYL) 20 MG tablet Take by mouth. 07/15/19   [provider]  dofetilide (TIKOSYN) 250 MCG capsule Take 1 capsule by mouth twice daily 04/05/22   Runell Gess, MD  Ferrous Fumarate-Folic Acid (HEMOCYTE-F) 324-1 MG TABS Take 1 tablet by mouth daily. 09/20/20   [provider]  fluticasone-salmeterol [facility] INHUB) 250-50 MCG/ACT AEPB USE 1 INHALATION BY MOUTH  twice daily 02/05/22   Jetty Duhamel D, MD  furosemide (LASIX) 40 MG tablet TAKE 1 TABLET BY MOUTH DAILY 04/23/22   Runell Gess, MD  hydroxychloroquine (PLAQUENIL) 200 MG tablet Take 200 mg by mouth daily.    [provider]  isosorbide mononitrate (IMDUR) 30 MG 24 hr tablet TAKE 1 TABLET BY MOUTH DAILY 07/02/22   Runell Gess, MD  leflunomide (ARAVA) 10 MG tablet Take 10 mg by mouth daily.    [provider]  loratadine (CLARITIN) 10 MG tablet Take 10 mg by mouth daily.    [provider]  losartan (COZAAR) 25 MG tablet Take 1 tablet by mouth once daily 01/05/22   Runell Gess, MD  Magnesium 200 MG TABS Take 1 tablet (200 mg total) by mouth daily. 04/15/18   Newman Nip, NP  metoprolol succinate (TOPROL-XL) 50 MG 24 hr tablet TAKE 1 TABLET BY MOUTH DAILY  WITH OR IMMEDIATELY FOLLOWING A  MEAL 04/09/22   Runell Gess,  MD  predniSONE (DELTASONE) 5 MG tablet Take 5 mg by mouth daily.    [provider]  Probiotic Product (PROBIOTIC DAILY PO) Take 1 tablet by mouth daily.    [provider]  Saccharomyces boulardii (PROBIOTIC) 250 MG CAPS 1 capsule    [provider]  simvastatin (ZOCOR) 20 MG tablet Take 1 tablet by mouth at  bedtime 06/07/15   Runell Gess, MD  sodium zirconium cyclosilicate (LOKELMA) 10 g PACK packet Take 10 g by mouth 2 (two) times daily. 01/10/21   Allred, Fayrene Fearing, MD  warfarin (COUMADIN) 5 MG tablet TAKE 1 TO 1 AND 1/2 TABLETS BY  MOUTH DAILY AS DIRECTED BY  COUMADIN CLINIC  06/05/22   Runell Gess, MD      Allergies    Spironolactone    Review of Systems   Review of Systems  Constitutional:  Positive for fatigue. Negative for chills and fever.  HENT:  Negative for congestion.   Eyes:  Negative for visual disturbance.  Respiratory:  Positive for shortness of breath.   Cardiovascular:  Positive for palpitations. Negative for chest pain.  Gastrointestinal:  Negative for abdominal pain and vomiting.  Genitourinary:  Negative for dysuria and flank pain.  Musculoskeletal:  Negative for back pain, neck pain and neck stiffness.  Skin:  Negative for rash.  Neurological:  Negative for light-headedness and headaches.    Physical Exam Updated Vital Signs BP 134/61   Pulse 72   Temp 98.4 F (36.9 C)   Resp 18   Ht 5\' 5"  (1.651 m)   Wt 112 kg   SpO2 100%   BMI 41.09 kg/m  Physical Exam Vitals and nursing note reviewed.  Constitutional:      General: She is not in acute distress.    Appearance: She is well-developed.  HENT:     Head: Normocephalic and atraumatic.     Mouth/Throat:     Mouth: Mucous membranes are moist.  Eyes:     General:        Right eye: No discharge.        Left eye: No discharge.     Conjunctiva/sclera: Conjunctivae normal.  Neck:     Trachea: No tracheal deviation.  Cardiovascular:     Rate and Rhythm: Normal rate and regular rhythm.     Heart sounds: Murmur heard.  Pulmonary:     Effort: Pulmonary effort is normal.     Breath sounds: Normal breath sounds.  Abdominal:     General: There is no distension.     Palpations: Abdomen is soft.     Tenderness: There is no abdominal tenderness. There is no guarding.  Musculoskeletal:        General: Swelling (minimal bilateral LE) present.     Cervical back: Normal range of motion and neck supple. No rigidity.  Skin:    General: Skin is warm.     Capillary Refill: Capillary refill takes less than 2 seconds.     Findings: No rash.  Neurological:     General: No focal  deficit present.     Mental Status: She is alert.     Cranial Nerves: No cranial nerve deficit.  Psychiatric:        Mood and Affect: Mood normal.     ED Results / Procedures / Treatments   Labs (all labs ordered are listed, but only abnormal results are displayed) Labs Reviewed  BASIC METABOLIC PANEL - Abnormal; Notable for the following components:  Result Value   Glucose, Bld 119 (*)    BUN 32 (*)    Creatinine, Ser 1.37 (*)    GFR, Estimated 42 (*)    All other components within normal limits  TROPONIN I (HIGH SENSITIVITY) - Abnormal; Notable for the following components:   Troponin I (High Sensitivity) 27 (*)    All other components within normal limits  TROPONIN I (HIGH SENSITIVITY) - Abnormal; Notable for the following components:   Troponin I (High Sensitivity) 31 (*)    All other components within normal limits  CBC WITH DIFFERENTIAL/PLATELET  TSH  PROTIME-INR    EKG EKG Interpretation  Date/Time:  Thursday Jul 05 2022 12:26:01 EDT Ventricular Rate:  80 PR Interval:  200 QRS Duration: 216 QT Interval:  472 QTC Calculation: 544 R Axis:   -76 Text Interpretation: Atrial-sensed ventricular-paced rhythm Abnormal ECG When compared with ECG of 08-Jun-2020 14:08, PREVIOUS ECG IS PRESENT Confirmed by Blane Ohara [phone]) on 07/05/2022 3:26:04 PM  Radiology DG Chest 1 View  Result Date: 07/05/2022 CLINICAL DATA:  Shortness of breath, palpitations EXAM: CHEST  1 VIEW COMPARISON:  09/08/2018 FINDINGS: Dual lead pacer remains in place. Prior median sternotomy and aortic valve prosthesis. Atherosclerotic calcification of the aortic arch. Mild enlargement of the cardiopericardial silhouette. The lungs appear clear.  No blunting of the costophrenic angles. Right shoulder arthropathy with flattening of the right humeral head and associated sclerosis. IMPRESSION: 1. Mild stable prominence of the cardiopericardial silhouette, without edema. 2. Prior median sternotomy and  aortic valve prosthesis. 3. Right shoulder arthropathy with flattening of the right humeral head and associated sclerosis. Electronically Signed   By: Gaylyn Rong M.D.   On: 07/05/2022 13:44    Procedures Procedures    Medications Ordered in ED Medications - No data to display  ED Course/ Medical Decision Making/ A&P                             Medical Decision Making Amount and/or Complexity of Data Reviewed Labs: ordered.   Patient presents with pacemaker and cardiology follow-up in the past and had significant episode of heart racing that was worse than she is ever experienced.  Patient directed to come in for further workup.  Blood work ordered independently reviewed showing mild chronic renal failure creatinine 1.3, troponin 27 and plan for repeat.  No chest pain or shortness of breath at this time.  Normal hemoglobin.  Chest x-ray independently reviewed results showing prominent cardiac silhouette without significant edema.  EKG reviewed showing paced rhythm.  Discussed with cardiology for consult.  Cardiology assessed recommended follow-up on May 30, troponin overall stable and patient stable for discharge.  No chest pain in the ED.  Vital signs normal on discharge.         Final Clinical Impression(s) / ED Diagnoses Final diagnoses:  Palpitations  Troponin level elevated    Rx / DC Orders ED Discharge Orders     None         Blane Ohara, MD 07/05/22 [phone]

## 2022-07-07 ENCOUNTER — Other Ambulatory Visit (HOSPITAL_COMMUNITY): Payer: Self-pay | Admitting: Cardiovascular Disease

## 2022-07-11 ENCOUNTER — Ambulatory Visit (INDEPENDENT_AMBULATORY_CARE_PROVIDER_SITE_OTHER): Payer: Medicare Other

## 2022-07-11 DIAGNOSIS — I442 Atrioventricular block, complete: Secondary | ICD-10-CM

## 2022-07-11 LAB — CUP PACEART REMOTE DEVICE CHECK
Battery Remaining Longevity: 11 mo
Battery Remaining Percentage: 10 %
Battery Voltage: 2.83 V
Brady Statistic AP VP Percent: 67 %
Brady Statistic AP VS Percent: 1 %
Brady Statistic AS VP Percent: 33 %
Brady Statistic AS VS Percent: 1 %
Brady Statistic RA Percent Paced: 64 %
Brady Statistic RV Percent Paced: 99 %
Date Time Interrogation Session: 20240522020026
Implantable Lead Connection Status: 753985
Implantable Lead Connection Status: 753985
Implantable Lead Implant Date: 20150116
Implantable Lead Implant Date: 20150116
Implantable Lead Location: 753859
Implantable Lead Location: 753860
Implantable Lead Model: 5076
Implantable Pulse Generator Implant Date: 20150116
Lead Channel Impedance Value: 360 Ohm
Lead Channel Impedance Value: 560 Ohm
Lead Channel Pacing Threshold Amplitude: 0.5 V
Lead Channel Pacing Threshold Amplitude: 0.75 V
Lead Channel Pacing Threshold Pulse Width: 0.4 ms
Lead Channel Pacing Threshold Pulse Width: 0.4 ms
Lead Channel Sensing Intrinsic Amplitude: 10.2 mV
Lead Channel Sensing Intrinsic Amplitude: 5 mV
Lead Channel Setting Pacing Amplitude: 2 V
Lead Channel Setting Pacing Amplitude: 2.5 V
Lead Channel Setting Pacing Pulse Width: 0.4 ms
Lead Channel Setting Sensing Sensitivity: 2 mV
Pulse Gen Model: 2240
Pulse Gen Serial Number: 7587004

## 2022-07-12 ENCOUNTER — Ambulatory Visit (INDEPENDENT_AMBULATORY_CARE_PROVIDER_SITE_OTHER): Payer: Medicare Other | Admitting: Cardiology

## 2022-07-12 DIAGNOSIS — I480 Paroxysmal atrial fibrillation: Secondary | ICD-10-CM | POA: Diagnosis not present

## 2022-07-12 DIAGNOSIS — Z5181 Encounter for therapeutic drug level monitoring: Secondary | ICD-10-CM | POA: Diagnosis not present

## 2022-07-12 LAB — POCT INR: INR: 3.3 — AB (ref 2.0–3.0)

## 2022-07-12 NOTE — Patient Instructions (Addendum)
Description   SELF TESTER-Spoke with pt and instructed her to continue taking warfarin 1 tablet daily EXCEPT 1.5 tablets on Mondays, Wednesdays, and Fridays.  Recheck INR in 2 weeks.  [facility] [phone]

## 2022-07-19 ENCOUNTER — Ambulatory Visit: Payer: Medicare Other | Attending: Nurse Practitioner | Admitting: Nurse Practitioner

## 2022-07-19 ENCOUNTER — Encounter: Payer: Self-pay | Admitting: Nurse Practitioner

## 2022-07-19 VITALS — BP 126/70 | HR 67 | Ht 65.0 in | Wt 241.2 lb

## 2022-07-19 DIAGNOSIS — I480 Paroxysmal atrial fibrillation: Secondary | ICD-10-CM | POA: Diagnosis not present

## 2022-07-19 DIAGNOSIS — E785 Hyperlipidemia, unspecified: Secondary | ICD-10-CM

## 2022-07-19 DIAGNOSIS — I701 Atherosclerosis of renal artery: Secondary | ICD-10-CM

## 2022-07-19 DIAGNOSIS — I10 Essential (primary) hypertension: Secondary | ICD-10-CM

## 2022-07-19 DIAGNOSIS — I6522 Occlusion and stenosis of left carotid artery: Secondary | ICD-10-CM

## 2022-07-19 DIAGNOSIS — I4719 Other supraventricular tachycardia: Secondary | ICD-10-CM | POA: Diagnosis not present

## 2022-07-19 DIAGNOSIS — I351 Nonrheumatic aortic (valve) insufficiency: Secondary | ICD-10-CM

## 2022-07-19 DIAGNOSIS — Z950 Presence of cardiac pacemaker: Secondary | ICD-10-CM | POA: Diagnosis not present

## 2022-07-19 DIAGNOSIS — I442 Atrioventricular block, complete: Secondary | ICD-10-CM | POA: Diagnosis not present

## 2022-07-19 DIAGNOSIS — Z952 Presence of prosthetic heart valve: Secondary | ICD-10-CM

## 2022-07-19 DIAGNOSIS — I5032 Chronic diastolic (congestive) heart failure: Secondary | ICD-10-CM

## 2022-07-19 DIAGNOSIS — I340 Nonrheumatic mitral (valve) insufficiency: Secondary | ICD-10-CM

## 2022-07-19 LAB — COMPREHENSIVE METABOLIC PANEL
ALT: 21 IU/L (ref 0–32)
AST: 18 IU/L (ref 0–40)
Albumin/Globulin Ratio: 1.7 (ref 1.2–2.2)
Albumin: 4.3 g/dL (ref 3.9–4.9)
Alkaline Phosphatase: 123 IU/L — ABNORMAL HIGH (ref 44–121)
BUN/Creatinine Ratio: 28 (ref 12–28)
BUN: 43 mg/dL — ABNORMAL HIGH (ref 8–27)
Bilirubin Total: 0.4 mg/dL (ref 0.0–1.2)
CO2: 26 mmol/L (ref 20–29)
Calcium: 9.9 mg/dL (ref 8.7–10.3)
Chloride: 102 mmol/L (ref 96–106)
Creatinine, Ser: 1.52 mg/dL — ABNORMAL HIGH (ref 0.57–1.00)
Globulin, Total: 2.5 g/dL (ref 1.5–4.5)
Glucose: 84 mg/dL (ref 70–99)
Potassium: 4.2 mmol/L (ref 3.5–5.2)
Sodium: 141 mmol/L (ref 134–144)
Total Protein: 6.8 g/dL (ref 6.0–8.5)
eGFR: 37 mL/min/{1.73_m2} — ABNORMAL LOW (ref >59)

## 2022-07-19 LAB — LIPID PANEL
Chol/HDL Ratio: 1.5 ratio (ref 0.0–4.4)
Cholesterol, Total: 148 mg/dL (ref 100–199)
HDL: 96 mg/dL (ref >39)
LDL Chol Calc (NIH): 37 mg/dL (ref 0–99)
Triglycerides: 80 mg/dL (ref 0–149)
VLDL Cholesterol Cal: 15 mg/dL (ref 5–40)

## 2022-07-19 NOTE — Progress Notes (Signed)
Office Visit    Patient Name: [name] Date of Encounter: 07/19/2022  Primary Care Provider:  Harvie Heck, MD Primary Cardiologist:  Nanetta Batty, MD  Chief Complaint   72 year old female with a history of complete heart block s/p PPM, paroxysmal atrial fibrillation on Tikosyn and Coumadin, aortic valve regurgitation s/p AVR in 2006, mitral valve regurgitation, chronic diastolic heart failure hypertension, hyperlipidemia, R renal artery stenosis with nonfunctioning left kidney, carotid artery stenosis, and Lupus who presents for hospital follow-up related to atrial tachycardia, PAF.  Past Medical History    Past Medical History:  Diagnosis Date   Antiphospholipid antibody positive 06/28/2017   Asthma    CAD (coronary artery disease)    a. normal cors by cath in 03/2015   Carotid artery disease (HCC)    Chronic anticoagulation    Complete heart block (HCC) 02/2013   a. Implantation of a dual chamber pacemaker in 02/2013 by Dr Johney Frame. STJ model number 2240 pacemaker with model number 2088 right ventricular lead.    Dyslipidemia    H/O Doppler ultrasound 10/03/2010   carotid duplex - R ICA normal patency; L CCA-ICA bypass gradt patent common to interal cartoid bypass graft    H/O mechanical aortic valve replacement    History of echocardiogram 11/08/2011   EF >55%; mild-mod concentric LVH; trace aortic regurg.; LA mild-mod dilated   History of nuclear stress test 01/20/2010   normal pattern of perfusion; low risk scan   Hypertension    Lupus (systemic lupus erythematosus) (HCC)    PVD (peripheral vascular disease) (HCC)    60% R renal artery stenosis; non-functioning L kidney   Past Surgical History:  Procedure Laterality Date   AORTIC VALVE REPLACEMENT  11/29/006   [facility]; St. Jude; on coumadin   CARDIAC CATHETERIZATION  11/01/2004   define anatomy    CARDIAC CATHETERIZATION N/A 04/19/2015   Procedure: Coronary Angiography;  Surgeon: Peter M Swaziland, MD;  Location:  [facility];  Service: Cardiovascular;  Laterality: N/A;   CAROTID ENDARTERECTOMY     lt.   CHOLECYSTECTOMY  1999   EXPLORATORY LAPAROTOMY  04/12/2012   small bowel perf; ileostomy & R hemicolectomy    PACEMAKER INSERTION  03/06/2013   SJM Assurity DR PPM implanted by Dr Johney Frame for complete heart block   PERMANENT PACEMAKER INSERTION N/A 03/06/2013   Procedure: PERMANENT PACEMAKER INSERTION;  Surgeon: Gardiner Rhyme, MD;  Location: [facility];  Service: Cardiovascular;  Laterality: N/A;   TEMPORARY PACEMAKER INSERTION N/A 03/06/2013   Procedure: TEMPORARY PACEMAKER INSERTION;  Surgeon: Gardiner Rhyme, MD;  Location: [facility];  Service: Cardiovascular;  Laterality: N/A;   TUBAL LIGATION  11/1982    Allergies  Allergies  Allergen Reactions   Spironolactone Other (See Comments)    Hyperkalemia     Labs/Other Studies Reviewed    The following studies were reviewed today:  Cardiac Studies & Procedures   CARDIAC CATHETERIZATION  CARDIAC CATHETERIZATION 04/19/2015  Narrative 1. Normal coronary anatomy. 2. Normal prosthetic valve disc movement by fluoro.  Findings Coronary Findings Diagnostic  Dominance: Right  Left Main Vessel was injected. Vessel is normal in caliber. Vessel is angiographically normal.  Left Anterior Descending Vessel was injected. Vessel is small. Vessel is angiographically normal.  First Diagonal Branch The vessel is large in size.  Second Diagonal Branch The vessel is large in size.  Left Circumflex Vessel was injected. Vessel is normal in caliber. Vessel is angiographically normal.  Right Coronary Artery Vessel  was injected. Vessel is large. Vessel is angiographically normal.  Intervention  No interventions have been documented.   STRESS TESTS  NM MYOCAR MULTI W/SPECT W 08/26/2012   ECHOCARDIOGRAM  ECHOCARDIOGRAM COMPLETE 02/16/2022  Narrative ECHOCARDIOGRAM REPORT    Patient Name:   [name] Date of Exam:  02/16/2022 Medical Rec #:  [id number]     Height:       65.0 in Accession #:    [id number]    Weight:       245.2 lb Date of Birth:  [birth year]      BSA:          2.157 m Patient Age:    71 years      BP:           122/80 mmHg Patient Gender: F             HR:           63 bpm. Exam Location:  [address]  Procedure: 2D Echo, Cardiac Doppler and Color Doppler  Indications:    S/p AVR Z95.2  History:        Patient has prior history of Echocardiogram examinations, most recent 03/14/2021. CHF, Pacemaker, S/p AVR (23mm [employer]ics mechanical), Arrythmias:Complete heart block; Risk Factors:Hypertension, Dyslipidemia and Obesity. Aortic Valve: 23 mm CarboMedics Mechanical valve is present in the aortic position.  Sonographer:    Samule Ohm RDCS Referring Phys: [phone] JONATHAN J BERRY   Sonographer Comments: Technically difficult study due to poor echo windows and patient is obese. Image acquisition challenging due to patient body habitus. IMPRESSIONS   1. Left ventricular ejection fraction, by estimation, is 50 to 55%. Left ventricular ejection fraction by PLAX is 52 %. The left ventricle has low normal function. The left ventricle demonstrates regional wall motion abnormalities (see scoring diagram/findings for description). There is severe concentric left ventricular hypertrophy. Left ventricular diastolic parameters are consistent with Grade II diastolic dysfunction (pseudonormalization). Elevated left ventricular end-diastolic pressure. 2. Right ventricular systolic function is moderately reduced. The right ventricular size is moderately enlarged. There is mildly elevated pulmonary artery systolic pressure. The estimated right ventricular systolic pressure is 40.8 mmHg. 3. Left atrial size was moderately dilated. 4. Right atrial size was severely dilated. 5. The mitral valve is abnormal. Moderate to severe mitral valve regurgitation. Moderate mitral annular calcification. 6. The  tricuspid valve is abnormal. Tricuspid valve regurgitation is torrential. 7. The aortic valve has been repaired/replaced. Aortic valve regurgitation is trivial. There is a 23 mm Carbo[employer] Mechanical valve present in the aortic position. Aortic valve area, by VTI measures 1.10 cm. Aortic valve mean gradient measures 11.6 mmHg. Aortic valve Vmax measures 2.45 m/s. Peak gradient 24.1 mmHg, DI 0.29. 8. The inferior vena cava is dilated in size with <50% respiratory variability, suggesting right atrial pressure of 15 mmHg.  Comparison(s): Changes from prior study are noted. 03/14/2021: LVEF 50-55%, AVR with 9 mmHg mean gradient.  FINDINGS Left Ventricle: Left ventricular ejection fraction, by estimation, is 50 to 55%. Left ventricular ejection fraction by PLAX is 52 %. The left ventricle has low normal function. The left ventricle demonstrates regional wall motion abnormalities. The left ventricular internal cavity size was normal in size. There is severe concentric left ventricular hypertrophy. Abnormal (paradoxical) septal motion, consistent with RV pacemaker. Left ventricular diastolic parameters are consistent with Grade II diastolic dysfunction (pseudonormalization). Elevated left ventricular end-diastolic pressure.  Right Ventricle: The right ventricular size is moderately enlarged. No increase in right ventricular wall thickness. Right  ventricular systolic function is moderately reduced. There is mildly elevated pulmonary artery systolic pressure. The tricuspid regurgitant velocity is 2.54 m/s, and with an assumed right atrial pressure of 15 mmHg, the estimated right ventricular systolic pressure is 40.8 mmHg.  Left Atrium: Left atrial size was moderately dilated.  Right Atrium: Right atrial size was severely dilated.  Pericardium: There is no evidence of pericardial effusion.  Mitral Valve: The mitral valve is abnormal. There is mild calcification of the posterior mitral valve leaflet(s).  Moderate mitral annular calcification. Moderate to severe mitral valve regurgitation.  Tricuspid Valve: The tricuspid valve is abnormal. Tricuspid valve regurgitation is torrential.  Aortic Valve: The aortic valve has been repaired/replaced. Aortic valve regurgitation is trivial. Aortic valve mean gradient measures 11.6 mmHg. Aortic valve peak gradient measures 24.1 mmHg. Aortic valve area, by VTI measures 1.10 cm. There is a 23 mm CarboMed[employer] Mechanical valve present in the aortic position.  Pulmonic Valve: The pulmonic valve was normal in structure. Pulmonic valve regurgitation is not visualized.  Aorta: The aortic root and ascending aorta are structurally normal, with no evidence of dilitation.  Venous: The inferior vena cava is dilated in size with less than 50% respiratory variability, suggesting right atrial pressure of 15 mmHg.  IAS/Shunts: There is right bowing of the interatrial septum, suggestive of elevated left atrial pressure. No atrial level shunt detected by color flow Doppler.  Additional Comments: A device lead is visualized.   LEFT VENTRICLE PLAX 2D LV EF:         Left            Diastology ventricular     LV e' medial:    5.73 cm/s ejection        LV E/e' medial:  16.4 fraction by     LV e' lateral:   8.96 cm/s PLAX is 52      LV E/e' lateral: 10.5 %. LVIDd:         5.90 cm LVIDs:         4.30 cm LV PW:         1.40 cm LV IVS:        1.80 cm LVOT diam:     2.20 cm LV SV:         57 LV SV Index:   26 LVOT Area:     3.80 cm   RIGHT VENTRICLE            IVC RV S prime:     7.25 cm/s  IVC diam: 2.90 cm TAPSE (M-mode): 2.1 cm RVSP:           28.8 mmHg  LEFT ATRIUM             Index        RIGHT ATRIUM           Index LA diam:        5.10 cm 2.36 cm/m   RA Pressure: 3.00 mmHg LA Vol (A2C):   80.9 ml 37.50 ml/m  RA Area:     35.40 cm LA Vol (A4C):   95.8 ml 44.41 ml/m  RA Volume:   147.00 ml 68.15 ml/m LA Biplane Vol: 95.6 ml 44.32 ml/m AORTIC  VALVE AV Area (Vmax):    1.21 cm AV Area (Vmean):   1.19 cm AV Area (VTI):     1.10 cm AV Vmax:           245.40 cm/s AV Vmean:  157.200 cm/s AV VTI:            0.519 m AV Peak Grad:      24.1 mmHg AV Mean Grad:      11.6 mmHg LVOT Vmax:         78.20 cm/s LVOT Vmean:        49.267 cm/s LVOT VTI:          0.150 m LVOT/AV VTI ratio: 0.29  AORTA Ao Root diam: 3.40 cm Ao Asc diam:  3.30 cm  MITRAL VALVE               TRICUSPID VALVE MV Area (PHT): 4.14 cm    TR Peak grad:   25.8 mmHg MV Decel Time: 183 msec    TR Vmax:        254.00 cm/s MV E velocity: 94.16 cm/s  Estimated RAP:  3.00 mmHg MV A velocity: 65.50 cm/s  RVSP:           28.8 mmHg MV E/A ratio:  1.44 SHUNTS Systemic VTI:  0.15 m Systemic Diam: 2.20 cm  Zoila Shutter MD Electronically signed by Zoila Shutter MD Signature Date/Time: 02/16/2022/2:44:28 PM    Final            Recent Labs: 07/05/2022: BUN 32; Creatinine, Ser 1.37; Hemoglobin 12.0; Platelets 182; Potassium 3.6; Sodium 141  Recent Lipid Panel    Component Value Date/Time   CHOL 174 12/02/2019 1125   TRIG 124 12/02/2019 1125   HDL 99 12/02/2019 1125   CHOLHDL 1.8 12/02/2019 1125   LDLCALC 54 12/02/2019 1125    History of Present Illness    72 year old female with the above past medical history including complete heart block s/p PPM, paroxysmal atrial fibrillation on Tikosyn and Coumadin, aortic valve regurgitation s/p AVR in 2006, mitral valve regurgitation, chronic diastolic heart failure, hypertension, hyperlipidemia, R renal artery stenosis with nonfunctioning left kidney, carotid artery stenosis, and Lupus.  She underwent Saint Jude AVR in September 2006 in the setting of aortic insufficiency, on Coumadin.  Cardiac catheterization at the time revealed normal coronary arteries.  She was noted to have 60% right renal artery stenosis with patent accessory left renal arteries (nonfunctioning left kidney by duplex).  She was hospitalized  in January 2015 in the setting of complete heart block and underwent PPM insertion.  Cardiac catheterization in 2017 again revealed normal coronary arteries.  She was hospitalized in November 2017 in the setting of acute diastolic heart failure.  Additionally, she has a history of atrial fibrillation, on Tikosyn.  She also has a history of carotid artery stenosis with chronic total occlusion of the left ICA.  Most recent carotid ultrasound in 07/2021 revealed widely patent right ICA.  Most recent echocardiogram in 01/2022 revealed EF 50 to 55%, low normal LV function, severe concentric LVH, G2 DD, moderate to severe mitral valve regurgitation, moderate MAC, moderate pulmonary hypertension, torrential tricuspid regurgitation, stable aortic valve prosthesis.  She was last seen in the office on 04/03/2022 and was stable from a cardiac standpoint.  She presented to the ED 07/05/2022 in the setting of palpitations.  Her symptoms lasted for approximately 3 to 4 minutes.  Remote device interrogation revealed an episode of atrial tachycardia that lasted approximately 3 minutes.  Troponin was mildly elevated.  Cardiology was consulted.  It was felt that her elevation in troponin was likely secondary to tachycardia.  She was advised to continue present medications and follow-up as an outpatient.  She presents today for  follow-up.  Since her last visit and since her recent ED visit she has been stable from a cardiac standpoint.  She notes stable intermittent fleeting palpitations, denies any significant palpitations, she denies chest pain, dyspnea, dizziness, syncope, edema, PND, orthopnea, weight gain. Overall, she reports feeling well.   Home Medications    Current Outpatient Medications  Medication Sig Dispense Refill   acetaminophen (TYLENOL) 325 MG tablet Take 650 mg by mouth every 6 (six) hours as needed for moderate pain.      albuterol (VENTOLIN HFA) 108 (90 Base) MCG/ACT inhaler Inhale 2 puffs into the lungs  every 6 (six) hours as needed for wheezing or shortness of breath. 24 g 3   allopurinol (ZYLOPRIM) 100 MG tablet Take 100 mg by mouth daily.      calcium carbonate (OSCAL) 1500 (600 Ca) MG TABS tablet Take 1 tablet by mouth 2 (two) times daily.     cetirizine (ZYRTEC) 10 MG tablet Take 10 mg by mouth at bedtime.     chlorhexidine (PERIDEX) 0.12 % solution Use as directed 10 mLs in the mouth or throat 2 (two) times daily as needed (before dental appts).   5   cholecalciferol (VITAMIN D) 1000 UNITS tablet Take 2,000 Units by mouth 2 (two) times daily.      clotrimazole-betamethasone (LOTRISONE) cream as needed.     dicyclomine (BENTYL) 20 MG tablet Take by mouth.     dofetilide (TIKOSYN) 250 MCG capsule Take 1 capsule by mouth twice daily 180 capsule 2   Ferrous Fumarate-Folic Acid (HEMOCYTE-F) 324-1 MG TABS Take 1 tablet by mouth daily.     fluticasone-salmeterol (WIXELA INHUB) 250-50 MCG/ACT AEPB USE 1 INHALATION BY MOUTH  twice daily 180 each 3   furosemide (LASIX) 40 MG tablet TAKE 1 TABLET BY MOUTH DAILY 100 tablet 1   hydroxychloroquine (PLAQUENIL) 200 MG tablet Take 200 mg by mouth daily.     isosorbide mononitrate (IMDUR) 30 MG 24 hr tablet TAKE 1 TABLET BY MOUTH DAILY 100 tablet 2   leflunomide (ARAVA) 10 MG tablet Take 10 mg by mouth daily.     loratadine (CLARITIN) 10 MG tablet Take 10 mg by mouth daily.     losartan (COZAAR) 25 MG tablet Take 1 tablet by mouth once daily 90 tablet 3   Magnesium 200 MG TABS Take 1 tablet (200 mg total) by mouth daily. 30 each    metoprolol succinate (TOPROL-XL) 50 MG 24 hr tablet TAKE 1 TABLET BY MOUTH DAILY  WITH OR IMMEDIATELY FOLLOWING A  MEAL 80 tablet 1   predniSONE (DELTASONE) 5 MG tablet Take 5 mg by mouth daily.     Probiotic Product (PROBIOTIC DAILY PO) Take 1 tablet by mouth daily.     Saccharomyces boulardii (PROBIOTIC) 250 MG CAPS 1 capsule     simvastatin (ZOCOR) 20 MG tablet Take 1 tablet by mouth at  bedtime 90 tablet 0   sodium  zirconium cyclosilicate (LOKELMA) 10 g PACK packet Take 10 g by mouth 2 (two) times daily. 4 packet 0   warfarin (COUMADIN) 5 MG tablet TAKE 1 TO 1 AND 1/2 TABLETS BY  MOUTH DAILY AS DIRECTED BY  COUMADIN CLINIC 150 tablet 1   No current facility-administered medications for this visit.     Review of Systems    She denies chest pain, dyspnea, pnd, orthopnea, n, v, dizziness, syncope, edema, weight gain, or early satiety. All other systems reviewed and are otherwise negative except as noted above.   Physical Exam  VS:  BP 126/70   Pulse 67   Ht 5\' 5"  (1.651 m)   Wt 241 lb 3.2 oz (109.4 kg)   SpO2 99%   BMI 40.14 kg/m  GEN: Well nourished, well developed, in no acute distress. HEENT: normal. Neck: Supple, no JVD, carotid bruits, or masses. Cardiac: RRR, no murmurs, rubs, or gallops. No clubbing, cyanosis, edema.  Radials/DP/PT 2+ and equal bilaterally.  Respiratory:  Respirations regular and unlabored, clear to auscultation bilaterally. GI: Soft, nontender, nondistended, BS + x 4. MS: no deformity or atrophy. Skin: warm and dry, no rash. Neuro:  Strength and sensation are intact. Psych: Normal affect.  Accessory Clinical Findings    ECG personally reviewed by me today - AV paced - no acute changes.   Lab Results  Component Value Date   WBC 6.5 07/05/2022   HGB 12.0 07/05/2022   HCT 38.8 07/05/2022   MCV 96.8 07/05/2022   PLT 182 07/05/2022   Lab Results  Component Value Date   CREATININE 1.37 (H) 07/05/2022   BUN 32 (H) 07/05/2022   NA 141 07/05/2022   K 3.6 07/05/2022   CL 101 07/05/2022   CO2 25 07/05/2022   Lab Results  Component Value Date   ALT 26 12/02/2019   AST 25 12/02/2019   ALKPHOS 91 12/02/2019   BILITOT 0.3 12/02/2019   Lab Results  Component Value Date   CHOL 174 12/02/2019   HDL 99 12/02/2019   LDLCALC 54 12/02/2019   TRIG 124 12/02/2019   CHOLHDL 1.8 12/02/2019    Lab Results  Component Value Date   HGBA1C 6.0 (H) 01/27/2015     Assessment & Plan    1. Paroxysmal atrial fibrillation/atrial tachycardia:  Recent remote device interrogation revealed an episode of atrial tachycardia that lasted approximately 3 minutes.  She has stable intermittent fleeting palpitations, denies any significant episodes.  Continue to monitor symptoms.  Discussed ED precautions.  Continue Tikosyn, metoprolol, warfarin.   2. Complete heart block: S/p PPM.  Follows with EP.  3. Valvular heart disease: Most recent echo in 01/2022 revealed EF 50 to 55%, low normal LV function, severe concentric LVH, G2 DD, moderate to severe mitral valve regurgitation, moderate MAC, moderate pulmonary hypertension, torrential tricuspid regurgitation, stable aortic valve prosthesis.  Repeat echo scheduled for 01/2023.  Gust SBE prophylaxis.  Continue warfarin.   4. Chronic diastolic heart failure: Most recent echo as above.  Euvolemic and well compensated on exam.  Continue losartan, metoprolol, Lasix.  5. Hypertension: BP well controlled. Continue current antihypertensive regimen.   6. Hyperlipidemia: No recent LDL on file.  Will update fasting lipid panel, CMET today.  Continue simvastatin.   7. Renal artery stenosis: Recently documented 60% right renal artery stenosis with patent accessory left renal arteries (nonfunctioning left kidney by duplex).  Most recent renal ultrasound in 07/2021 revealed no evidence of right renal artery stenosis, known left renal artery occlusion.  Repeat renal artery duplex scheduled for 07/2022.  8. Carotid artery stenosis: Carotid ultrasound in 07/2021 revealed minimal thickening or plaque of R ICA, stable total occlusion of LICA.  Repeat bilateral carotid ultrasound scheduled for 07/2022.  Continue simvastatin.  9. Disposition: Follow-up as scheduled with Edd Fabian, NP in 10/2022.       Joylene Grapes, NP 07/19/2022, 8:45 AM

## 2022-07-19 NOTE — Patient Instructions (Signed)
Medication Instructions:  Your physician recommends that you continue on your current medications as directed. Please refer to the Current Medication list given to you today.  *If you need a refill on your cardiac medications before your next appointment, please call your pharmacy*   Lab Work: Fasting Lipid panel & CMET today    Testing/Procedures: NONE ordered at this time of appointment     Follow-Up: At [facility], you and your health needs are our priority.  As part of our continuing mission to provide you with exceptional heart care, we have created designated Provider Care Teams.  These Care Teams include your primary Cardiologist (physician) and Advanced Practice Providers (APPs -  Physician Assistants and Nurse Practitioners) who all work together to provide you with the care you need, when you need it.  We recommend signing up for the patient portal called "MyChart".  Sign up information is provided on this After Visit Summary.  MyChart is used to connect with patients for Virtual Visits (Telemedicine).  Patients are able to view lab/test results, encounter notes, upcoming appointments, etc.  Non-urgent messages can be sent to your provider as well.   To learn more about what you can do with MyChart, go to [email or web address].    Your next appointment:    Keep follow up   Provider:   Edd Fabian, FNP        Other Instructions

## 2022-07-23 ENCOUNTER — Telehealth: Payer: Self-pay

## 2022-07-23 NOTE — Telephone Encounter (Signed)
Spoke with pt. Pt was notified of lab results. Pt will continue current medication and f/u as planned.  

## 2022-07-26 ENCOUNTER — Ambulatory Visit (INDEPENDENT_AMBULATORY_CARE_PROVIDER_SITE_OTHER): Payer: Medicare Other

## 2022-07-26 DIAGNOSIS — Z5181 Encounter for therapeutic drug level monitoring: Secondary | ICD-10-CM | POA: Diagnosis not present

## 2022-07-26 LAB — POCT INR: INR: 2.3 (ref 2.0–3.0)

## 2022-07-26 NOTE — Patient Instructions (Signed)
Description   SELF TESTER-Spoke with pt and instructed her to take 1.5 tablets today and then continue taking warfarin 1 tablet daily EXCEPT 1.5 tablets on Mondays, Wednesdays, and Fridays.  Recheck INR in 2 weeks.  [facility] [phone]

## 2022-08-01 ENCOUNTER — Other Ambulatory Visit: Payer: Self-pay | Admitting: Cardiovascular Disease

## 2022-08-01 DIAGNOSIS — I4729 Other ventricular tachycardia: Secondary | ICD-10-CM

## 2022-08-01 NOTE — Progress Notes (Signed)
Remote pacemaker transmission.   

## 2022-08-02 ENCOUNTER — Ambulatory Visit (HOSPITAL_COMMUNITY): Admission: RE | Admit: 2022-08-02 | Payer: Medicare Other | Source: Ambulatory Visit

## 2022-08-02 ENCOUNTER — Encounter (HOSPITAL_COMMUNITY): Payer: Medicare Other

## 2022-08-03 ENCOUNTER — Ambulatory Visit (HOSPITAL_COMMUNITY)
Admission: RE | Admit: 2022-08-03 | Discharge: 2022-08-03 | Disposition: A | Discharge status: Home / Self Care | Payer: Medicare Other | Source: Ambulatory Visit | Attending: Cardiovascular Disease | Admitting: Cardiovascular Disease

## 2022-08-03 ENCOUNTER — Ambulatory Visit (HOSPITAL_BASED_OUTPATIENT_CLINIC_OR_DEPARTMENT_OTHER)
Admission: RE | Admit: 2022-08-03 | Discharge: 2022-08-03 | Disposition: A | Discharge status: Home / Self Care | Payer: Medicare Other | Source: Ambulatory Visit | Attending: Cardiovascular Disease | Admitting: Cardiovascular Disease

## 2022-08-03 DIAGNOSIS — I6522 Occlusion and stenosis of left carotid artery: Secondary | ICD-10-CM

## 2022-08-03 DIAGNOSIS — I779 Disorder of arteries and arterioles, unspecified: Secondary | ICD-10-CM

## 2022-08-03 DIAGNOSIS — I701 Atherosclerosis of renal artery: Secondary | ICD-10-CM | POA: Diagnosis not present

## 2022-08-09 ENCOUNTER — Telehealth: Payer: Self-pay | Admitting: *Deleted

## 2022-08-09 ENCOUNTER — Ambulatory Visit (INDEPENDENT_AMBULATORY_CARE_PROVIDER_SITE_OTHER): Payer: Self-pay | Admitting: *Deleted

## 2022-08-09 DIAGNOSIS — Z5181 Encounter for therapeutic drug level monitoring: Secondary | ICD-10-CM

## 2022-08-09 DIAGNOSIS — Z952 Presence of prosthetic heart valve: Secondary | ICD-10-CM | POA: Diagnosis not present

## 2022-08-09 LAB — POCT INR: INR: 2.3 (ref 2.0–3.0)

## 2022-08-09 NOTE — Telephone Encounter (Signed)
Called pt since INR is due; she states she will take care of it now anda call back. Will await and follow up

## 2022-08-09 NOTE — Patient Instructions (Signed)
Description   SELF TESTER-Spoke with pt and instructed her to take 1.5 tablets today (had more broccoli) and then continue taking warfarin 1 tablet daily EXCEPT 1.5 tablets on Mondays, Wednesdays, and Fridays.  Recheck INR in 2 weeks.  [facility] [phone]

## 2022-08-22 ENCOUNTER — Telehealth: Payer: Self-pay | Admitting: *Deleted

## 2022-08-22 ENCOUNTER — Ambulatory Visit (INDEPENDENT_AMBULATORY_CARE_PROVIDER_SITE_OTHER): Payer: Medicare Other | Admitting: *Deleted

## 2022-08-22 DIAGNOSIS — Z952 Presence of prosthetic heart valve: Secondary | ICD-10-CM | POA: Diagnosis not present

## 2022-08-22 DIAGNOSIS — Z5181 Encounter for therapeutic drug level monitoring: Secondary | ICD-10-CM | POA: Diagnosis not present

## 2022-08-22 LAB — POCT INR: INR: 3.5 — AB (ref 2.0–3.0)

## 2022-08-22 NOTE — Telephone Encounter (Signed)
Called pt since INR is due; she states she will perform it in a few. Will await and follow up.

## 2022-08-22 NOTE — Patient Instructions (Signed)
Description   SELF TESTER-Spoke with pt and instructed her continue taking warfarin 1 tablet daily EXCEPT 1.5 tablets on Mondays, Wednesdays, and Fridays.  Recheck INR in 2 weeks.  [facility] [phone]

## 2022-09-05 ENCOUNTER — Ambulatory Visit (INDEPENDENT_AMBULATORY_CARE_PROVIDER_SITE_OTHER): Payer: Medicare Other | Admitting: *Deleted

## 2022-09-05 DIAGNOSIS — Z952 Presence of prosthetic heart valve: Secondary | ICD-10-CM

## 2022-09-05 DIAGNOSIS — Z5181 Encounter for therapeutic drug level monitoring: Secondary | ICD-10-CM | POA: Diagnosis not present

## 2022-09-05 LAB — POCT INR: INR: 3.2 — AB (ref 2.0–3.0)

## 2022-09-05 NOTE — Patient Instructions (Signed)
Description   SELF TESTER-Spoke with pt and instructed her continue taking warfarin 1 tablet daily EXCEPT 1.5 tablets on Mondays, Wednesdays, and Fridays.  Recheck INR in 2 weeks.  [facility] [phone]

## 2022-09-19 ENCOUNTER — Telehealth: Payer: Self-pay | Admitting: *Deleted

## 2022-09-19 ENCOUNTER — Ambulatory Visit (INDEPENDENT_AMBULATORY_CARE_PROVIDER_SITE_OTHER): Payer: Medicare Other

## 2022-09-19 DIAGNOSIS — Z5181 Encounter for therapeutic drug level monitoring: Secondary | ICD-10-CM

## 2022-09-19 DIAGNOSIS — Z952 Presence of prosthetic heart valve: Secondary | ICD-10-CM | POA: Diagnosis not present

## 2022-09-19 LAB — POCT INR: INR: 3.6 — AB (ref 2.0–3.0)

## 2022-09-19 NOTE — Patient Instructions (Signed)
Description   SELF TESTER-Spoke with pt and instructed her eat greens today and continue taking warfarin 1 tablet daily EXCEPT 1.5 tablets on Mondays, Wednesdays, and Fridays.  Recheck INR in 2 weeks.  [facility] [phone]

## 2022-09-19 NOTE — Telephone Encounter (Signed)
Called pt since INR is due; she stated she has another appointment at 2pm and will get it done by 5pm today and call us back. She plans on going out of town tomorrow at [employer] so she knows she has to get it done before leaving. Will await for her to call back and follow no later than tomorrow.

## 2022-10-03 ENCOUNTER — Telehealth: Payer: Self-pay

## 2022-10-03 NOTE — Telephone Encounter (Signed)
INR due today. Called and spoke to pt. Pt states she will check INR tomorrow, 10/04/22.

## 2022-10-04 ENCOUNTER — Ambulatory Visit (INDEPENDENT_AMBULATORY_CARE_PROVIDER_SITE_OTHER): Payer: Medicare Other

## 2022-10-04 DIAGNOSIS — Z952 Presence of prosthetic heart valve: Secondary | ICD-10-CM

## 2022-10-04 DIAGNOSIS — Z5181 Encounter for therapeutic drug level monitoring: Secondary | ICD-10-CM | POA: Diagnosis not present

## 2022-10-04 LAB — POCT INR: INR: 4.1 — AB (ref 2.0–3.0)

## 2022-10-04 NOTE — Patient Instructions (Signed)
Description   SELF TESTER-Spoke with pt and instructed her to skip today's dosage of Warfarin, then resume same dosage of Warfarin 1 tablet daily EXCEPT 1.5 tablets on Mondays, Wednesdays, and Fridays.  Recheck INR in 2 weeks.  [facility] [phone]

## 2022-10-10 ENCOUNTER — Ambulatory Visit (INDEPENDENT_AMBULATORY_CARE_PROVIDER_SITE_OTHER): Payer: Medicare Other

## 2022-10-10 DIAGNOSIS — I442 Atrioventricular block, complete: Secondary | ICD-10-CM | POA: Diagnosis not present

## 2022-10-10 LAB — CUP PACEART REMOTE DEVICE CHECK
Battery Remaining Longevity: 8 mo
Battery Remaining Percentage: 7 %
Battery Voltage: 2.78 V
Brady Statistic AP VP Percent: 66 %
Brady Statistic AP VS Percent: 1 %
Brady Statistic AS VP Percent: 34 %
Brady Statistic AS VS Percent: 1 %
Brady Statistic RA Percent Paced: 63 %
Brady Statistic RV Percent Paced: 99 %
Date Time Interrogation Session: 20240821032433
Implantable Lead Connection Status: 753985
Implantable Lead Connection Status: 753985
Implantable Lead Implant Date: 20150116
Implantable Lead Implant Date: 20150116
Implantable Lead Location: 753859
Implantable Lead Location: 753860
Implantable Lead Model: 5076
Implantable Pulse Generator Implant Date: 20150116
Lead Channel Impedance Value: 350 Ohm
Lead Channel Impedance Value: 550 Ohm
Lead Channel Pacing Threshold Amplitude: 0.5 V
Lead Channel Pacing Threshold Amplitude: 0.75 V
Lead Channel Pacing Threshold Pulse Width: 0.4 ms
Lead Channel Pacing Threshold Pulse Width: 0.4 ms
Lead Channel Sensing Intrinsic Amplitude: 10.2 mV
Lead Channel Sensing Intrinsic Amplitude: 3.9 mV
Lead Channel Setting Pacing Amplitude: 2 V
Lead Channel Setting Pacing Amplitude: 2.5 V
Lead Channel Setting Pacing Pulse Width: 0.4 ms
Lead Channel Setting Sensing Sensitivity: 2 mV
Pulse Gen Model: 2240
Pulse Gen Serial Number: 7587004

## 2022-10-18 ENCOUNTER — Ambulatory Visit (INDEPENDENT_AMBULATORY_CARE_PROVIDER_SITE_OTHER): Payer: Medicare Other | Admitting: *Deleted

## 2022-10-18 DIAGNOSIS — Z5181 Encounter for therapeutic drug level monitoring: Secondary | ICD-10-CM | POA: Diagnosis not present

## 2022-10-18 DIAGNOSIS — Z952 Presence of prosthetic heart valve: Secondary | ICD-10-CM | POA: Diagnosis not present

## 2022-10-18 LAB — POCT INR: INR: 3.5 — AB (ref 2.0–3.0)

## 2022-10-18 NOTE — Patient Instructions (Signed)
Description   SELF TESTER-Spoke with pt and instructed her to take 1 tablet of warfarin tomorrow (since not eating a lot of leafy veggies) then resume same dosage of Warfarin 1 tablet daily EXCEPT 1.5 tablets on Mondays, Wednesdays, and Fridays.  Recheck INR in 2 weeks.  [facility] [phone]

## 2022-10-21 ENCOUNTER — Other Ambulatory Visit: Payer: Self-pay | Admitting: Cardiovascular Disease

## 2022-10-21 DIAGNOSIS — I5031 Acute diastolic (congestive) heart failure: Secondary | ICD-10-CM

## 2022-10-23 NOTE — Progress Notes (Signed)
Remote pacemaker transmission.   

## 2022-10-24 NOTE — Progress Notes (Unsigned)
Cardiology Clinic Note   Patient Name: [name] Date of Encounter: 10/29/2022  Primary Care Provider:  Harvie Heck, MD Primary Cardiologist:  Nanetta Batty, MD  Patient Profile    [name] 72 year old female presents to the clinic today for follow-up evaluation of her acute on chronic combined systolic and diastolic CHF.  Past Medical History    Past Medical History:  Diagnosis Date   Antiphospholipid antibody positive 06/28/2017   Asthma    CAD (coronary artery disease)    a. normal cors by cath in 03/2015   Carotid artery disease (HCC)    Chronic anticoagulation    Complete heart block (HCC) 02/2013   a. Implantation of a dual chamber pacemaker in 02/2013 by Dr Johney Frame. STJ model number 2240 pacemaker with model number 2088 right ventricular lead.    Dyslipidemia    H/O Doppler ultrasound 10/03/2010   carotid duplex - R ICA normal patency; L CCA-ICA bypass gradt patent common to interal cartoid bypass graft    H/O mechanical aortic valve replacement    History of echocardiogram 11/08/2011   EF >55%; mild-mod concentric LVH; trace aortic regurg.; LA mild-mod dilated   History of nuclear stress test 01/20/2010   normal pattern of perfusion; low risk scan   Hypertension    Lupus (systemic lupus erythematosus) (HCC)    PVD (peripheral vascular disease) (HCC)    60% R renal artery stenosis; non-functioning L kidney   Past Surgical History:  Procedure Laterality Date   AORTIC VALVE REPLACEMENT  11/29/006   [facility]hardt; [employer]; on coumadin   CARDIAC CATHETERIZATION  11/01/2004   define anatomy    CARDIAC CATHETERIZATION N/A 04/19/2015   Procedure: Coronary Angiography;  Surgeon: Peter M Swaziland, MD;  Location: [facility];  Service: Cardiovascular;  Laterality: N/A;   CAROTID ENDARTERECTOMY     lt.   CHOLECYSTECTOMY  1999   EXPLORATORY LAPAROTOMY  04/12/2012   small bowel perf; ileostomy & R hemicolectomy    PACEMAKER INSERTION  03/06/2013   SJM Assurity DR  PPM implanted by Dr Johney Frame for complete heart block   PERMANENT PACEMAKER INSERTION N/A 03/06/2013   Procedure: PERMANENT PACEMAKER INSERTION;  Surgeon: Gardiner Rhyme, MD;  Location: [facility] CATH LAB;  Service: Cardiovascular;  Laterality: N/A;   TEMPORARY PACEMAKER INSERTION N/A 03/06/2013   Procedure: TEMPORARY PACEMAKER INSERTION;  Surgeon: Gardiner Rhyme, MD;  Location: [facility] CATH [facility];  Service: Cardiovascular;  Laterality: N/A;   TUBAL LIGATION  11/1982    Allergies  Allergies  Allergen Reactions   Spironolactone Other (See Comments)    Hyperkalemia    History of Present Illness    [name] has a PMH of atrial fibrillation with mechanical AVR on warfarin, hypertension, chronic combined systolic and diastolic CHF, and renal insufficiency.  Her echocardiogram 12/20/2020 showed an LVEF of 30-35%.  She was referred to pharmacy by Dr. Johney Frame for management of her heart failure medication.  She was initiated on Entresto 24-26 and repeat labs showed hyperkalemia.  Her lab work from 01/09/2021 showed a potassium of 5.4.  She was given 2 days of Lokelma and her follow-up lab work on 01/16/2021 showed a potassium of 4.4.      During her follow-up visit with pharmacy 12/30/2020 she shortness of breath with increased physical activity.  She denied chest pain and palpitations.  She was able to complete all of her ADLs.  She had not been checking her blood pressure at home because she felt  her blood pressure cuff was not accurate.  She was noted to have bilateral lower extremity swelling was compliant with her furosemide.  She had been weighing herself daily and reported that it was stable.  Of note she was also unable to tolerate spironolactone in 2018 due to hyperkalemia.  She presented to the clinic 01/24/21 for follow-up evaluation stated she felt well.  She was also seeing nephrology. We reviewed her prior medications and her increased potassium.  She did not tolerate Entresto.  We started  her on  losartan 25 mg daily.  I gave her the [employer] support stocking sheet and a salty 6 diet sheet.  We planned follow-up for 1 month with me or pharmacy for up titration of her losartan.  She presented to the clinic 03/03/21 for follow-up evaluation stated she felt well .  She did have a cough over the Christmas holidays.  She felt that she picked up a virus from her granddaughter who was staying with her at the time.  She felt she had recovered well.  Her furosemide was being dosed by nephrology.  She was taking 40 mg daily.  She brought in her BMP  that showed a creatinine of 1.61.  I continued her current dosing of her losartan and planned repeat  echocardiogram.  She reported that she had not been eating out and was avoiding salt.  Her diet made a significant difference in her weight and lower extremity swelling.  She was euvolemic .  And her weight was down another 2 pounds.    We planned her follow-up in 4 months.  She was seen by Dr. Allyson Sabal on 07/12/2021.  She continued to be followed by the A-fib clinic.  Her sinus rhythm has maintained with Tikosyn therapy.  She was compliant with Coumadin therapy.  She continued to do well.  She denied chest pain and shortness of breath.  Her echocardiogram showed normal LV systolic function with G2 DD and well-functioning mechanical aortic valve which had been placed on 03/10/2021.  She was trialed on spironolactone however it was discontinued due to hyperkalemia.  Her Sherryll Burger was also transitioned to losartan.  She presented to the clinic 10/19/21 for follow-up evaluation stated she noticed some blood in the toilet 2 to 3 weeks ago for 1 occurrence.  Then 2 to 3 weeks later she noticed another recurrence of blood in her toilet.  She  had no further occurrences.  She reported that she had occasional dietary indiscretion but tried to be very strict with her low-salt diet.  Over the last week she noted some increased shortness of breath however in the last 2 days her  breathing has been much better.  She had been staying active helping care for her granddaughter who had started middle school.  She did report that over the last 2 to 3 months she had not been walking as much.  I encouraged her to continue her heart healthy low-sodium diet, increase her physical activity as tolerated and we  planned follow-up in 6 months.     She contacted nurse triage line on 01/22/2022 and reported a 8-9 pound weight increase and shortness of breath that had developed over the previous 24 hours.  She presented to clinic 01/31/22 for follow-up evaluation and stated she was feeling much better.  She reported that for Thanksgiving she had ham and then a few days later had leftover ham.  She reported that her weight climbed to 251 pounds and she noted burning with her  breathing.  She took 5 days of double Lasix and her weight  was 245 pounds at her visit.  She  returned to her normal sleeping.  She did note some dizziness that appeared to be related to her vestibular system.  We reviewed her upcoming appointment with Dr.Berry and upcoming echocardiogram.  I ordered a BMP  and had her follow-up as scheduled.  She was seen in follow-up by Bernadene Person NP on 07/19/2022.  She reported and the ED visit.  She had been stable from a cardiac standpoint.  She had stable intermittent palpitations.  She denied significant palpitations.  She denied chest pain, dyspnea, dizziness, lower extremity swelling, and weight gain.  She reported overall feeling well.  She presents to the clinic today for follow-up evaluation and states she has just returned from spending 6 weeks in [place] with her sister.  She enjoyed her time with her sister however she notes that her sister is now caring for a 60-year-old which has increased her stress.  She reports that her sister does not cook and she had dietary indiscretion.  She tried to choose low-fat low-sodium options.  Her weight today is 249.2 pounds.  She has been  taking extra Lasix for the last couple days.  She has noticed a decrease in her lower extremity swelling.  She reports that she is in the process of unpacking and is looking forward to getting back to her normal routine and diet.  I will give her a weight log, increase her furosemide for 3 days, order a BMP and 1 week and plan follow-up in 2 to 3 weeks..  Today she denies chest pain, shortness of breath,  fatigue, palpitations, melena, hematuria, hemoptysis, diaphoresis, weakness, presyncope, syncope, orthopnea, and PND.     Home Medications    Prior to Admission medications   Medication Sig Start Date End Date Taking? Authorizing Provider  acetaminophen (TYLENOL) 325 MG tablet Take 650 mg by mouth every 6 (six) hours as needed for moderate pain.     [provider]  albuterol (PROVENTIL HFA;VENTOLIN HFA) 108 (90 Base) MCG/ACT inhaler Inhale 2 puffs into the lungs every 6 (six) hours as needed for wheezing or shortness of breath. 06/10/17   Jetty Duhamel D, MD  allopurinol (ZYLOPRIM) 100 MG tablet Take 100 mg by mouth daily.     [provider]  calcium carbonate (OSCAL) 1500 (600 Ca) MG TABS tablet Take 1 tablet by mouth 2 (two) times daily.    [provider]  cetirizine (ZYRTEC) 10 MG tablet Take 10 mg by mouth at bedtime.    [provider]  chlorhexidine (PERIDEX) 0.12 % solution Use as directed 10 mLs in the mouth or throat 2 (two) times daily as needed (before dental appts).  03/31/15   [provider]  cholecalciferol (VITAMIN D) 1000 UNITS tablet Take 2,000 Units by mouth 2 (two) times daily.     [provider]  clotrimazole-betamethasone (LOTRISONE) cream as needed. 11/04/18   [provider]  colestipol (COLESTID) 1 g tablet Take 1 g by mouth daily.    [provider]  dicyclomine (BENTYL) 20 MG tablet Take by mouth. 07/15/19   [provider]  dofetilide (TIKOSYN) 250 MCG capsule Take 1 capsule by mouth  twice daily 07/22/20   Fenton, Clint R, PA  Ferrous Fumarate-Folic Acid (HEMOCYTE-F) 324-1 MG TABS Take 1 tablet by mouth daily. 09/20/20   [provider]  fluticasone-salmeterol (WIXELA INHUB) 250-50 MCG/ACT AEPB USE 1 INHALATION  BY MOUTH  twice daily 11/25/20   Jetty Duhamel D, MD  furosemide (LASIX) 20 MG tablet Take 1 tablet (20 mg total) by mouth daily. 10/19/20   Runell Gess, MD  hydroxychloroquine (PLAQUENIL) 200 MG tablet Take 200 mg by mouth daily.    [provider]  isosorbide mononitrate (IMDUR) 30 MG 24 hr tablet TAKE 1 TABLET BY MOUTH  DAILY 11/22/20   Runell Gess, MD  leflunomide (ARAVA) 10 MG tablet Take 10 mg by mouth daily.    [provider]  loratadine (CLARITIN) 10 MG tablet Take 10 mg by mouth daily.    [provider]  Magnesium 200 MG TABS Take 1 tablet (200 mg total) by mouth daily. 04/15/18   Newman Nip, NP  metoprolol succinate (TOPROL-XL) 50 MG 24 hr tablet TAKE 1 TABLET BY MOUTH  DAILY WITH OR IMMEDIATELY  FOLLOWING A MEAL 11/22/20   Runell Gess, MD  potassium chloride SA (KLOR-CON) 20 MEQ tablet TAKE 1  BY MOUTH TWICE DAILY 07/06/20   Newman Nip, NP  predniSONE (DELTASONE) 5 MG tablet Take 5 mg by mouth daily.    [provider]  Probiotic Product (PROBIOTIC DAILY PO) Take 1 tablet by mouth daily.    [provider]  Saccharomyces boulardii (PROBIOTIC) 250 MG CAPS 1 capsule    [provider]  simvastatin (ZOCOR) 20 MG tablet Take 1 tablet by mouth at  bedtime 06/07/15   Runell Gess, MD  sodium zirconium cyclosilicate (LOKELMA) 10 g PACK packet Take 10 g by mouth 2 (two) times daily. 01/10/21   Allred, Fayrene Fearing, MD  warfarin (COUMADIN) 5 MG tablet TAKE 1 TO 1 AND 1/2 TABLETS BY MOUTH DAILY AS DIRECTED  BY COUMADIN CLINIC 08/10/20   Runell Gess, MD    Family History    Family History  Problem Relation Age of Onset   Alzheimer's disease Father    Cirrhosis Father    Cancer  Father        prostate   Heart attack Neg Hx    She indicated that her mother is alive. She indicated that her father is deceased. She indicated that her maternal grandmother is deceased. She indicated that her maternal grandfather is deceased. She indicated that her paternal grandmother is deceased. She indicated that her paternal grandfather is deceased. She indicated that the status of her neg hx is unknown.   Social History    Social History   Socioeconomic History   Marital status: Divorced    Spouse name: Not on file   Number of children: 3   Years of education: Not on file   Highest education level: Not on file  Occupational History   Occupation: Retired  Tobacco Use   Smoking status: Never   Smokeless tobacco: Never  Vaping Use   Vaping status: Never Used  Substance and Sexual Activity   Alcohol use: No   Drug use: No   Sexual activity: Not on file  Other Topics Concern   Not on file  Social History Narrative   Lives alone in [place].   Social Determinants of Health   Financial Resource Strain: Low Risk  (07/04/2022)   Received from [facility], [facility]   Overall Financial Resource Strain (CARDIA)    Difficulty of Paying Living Expenses: Not hard at all  Food Insecurity: No Food Insecurity (07/04/2022)   Received from [facility], [facility]   Hunger Vital Sign    Worried About  Running Out of Food in the Last Year: Never true    Ran Out of Food in the Last Year: Never true  Transportation Needs: No Transportation Needs (07/04/2022)   Received from [facility], [facility]   PRAPARE - Transportation    Lack of Transportation (Medical): No    Lack of Transportation (Non-Medical): No  Physical Activity: Insufficiently Active (07/04/2022)   Received from [facility], [facility]   Exercise Vital Sign    Days of Exercise per Week: 2 days    Minutes of Exercise per Session: 30 min  Stress: No Stress Concern Present (07/04/2022)   Received  from [facility], [facility] - Occupational Stress Questionnaire    Feeling of Stress : Not at all  Social Connections: Moderately Integrated (07/04/2022)   Received from [facility], [facility]   Social Network    How would you rate your social network (family, work, friends)?: Adequate participation with social networks  Intimate Partner Violence: Not At Risk (07/04/2022)   Received from [facility], [facility]   HITS    Over the last 12 months how often did your partner physically hurt you?: 1    Over the last 12 months how often did your partner insult you or talk down to you?: 1    Over the last 12 months how often did your partner threaten you with physical harm?: 1    Over the last 12 months how often did your partner scream or curse at you?: 1     Review of Systems    General:  No chills, fever, night sweats or weight changes.  Cardiovascular:  No chest pain, dyspnea on exertion, edema, orthopnea, palpitations, paroxysmal nocturnal dyspnea. Dermatological: No rash, lesions/masses Respiratory: No cough, dyspnea Urologic: No hematuria, dysuria Abdominal:   No nausea, vomiting, diarrhea, bright red blood per rectum, melena, or hematemesis Neurologic:  No visual changes, wkns, changes in mental status. All other systems reviewed and are otherwise negative except as noted above.  Physical Exam    VS:  BP 122/70 (BP Location: Left Arm, Patient Position: Sitting, Cuff Size: Normal)   Pulse 90   Ht 5\' 5"  (1.651 m)   Wt 249 lb 3.2 oz (113 kg)   SpO2 97%   BMI 41.47 kg/m  , BMI Body mass index is 41.47 kg/m. GEN: Well nourished, well developed, in no acute distress. HEENT: normal. Neck: Supple, no JVD, carotid bruits, or masses. Cardiac: RRR, no murmurs, rubs, or gallops. No clubbing, cyanosis, left greater than right 2+ lower extremity pitting edema.  Radials/DP/PT 2+ and equal bilaterally.  Respiratory:  Respirations  regular and unlabored, clear to auscultation bilaterally. GI: Soft, nontender, nondistended, BS + x 4. MS: no deformity or atrophy. Skin: warm and dry, no rash. Neuro:  Strength and sensation are intact. Psych: Normal affect.  Accessory Clinical Findings    Recent Labs: 07/05/2022: Hemoglobin 12.0; Platelets 182 07/19/2022: ALT 21; BUN 43; Creatinine, Ser 1.52; Potassium 4.2; Sodium 141   Recent Lipid Panel    Component Value Date/Time   CHOL 148 07/19/2022 0851   TRIG 80 07/19/2022 0851   HDL 96 07/19/2022 0851   CHOLHDL 1.5 07/19/2022 0851   LDLCALC 37 07/19/2022 0851    ECG personally reviewed by me today-none today.  Echocardiogram 12/20/2020  IMPRESSIONS     1. Left ventricular ejection fraction, by estimation, is 30 to 35%. The  left ventricle has moderately decreased function. The left  ventricle  demonstrates regional wall motion abnormalities with septal and apical  hypokinesis and septal-lateral  dyssynchrony. The left ventricular internal cavity size was mildly  dilated. There is mild left ventricular hypertrophy. Left ventricular  diastolic parameters are consistent with Grade II diastolic dysfunction  (pseudonormalization).   2. Right ventricular systolic function is mildly reduced. The right  ventricular size is moderately enlarged. There is moderately elevated  pulmonary artery systolic pressure. The estimated right ventricular  systolic pressure is 51.5 mmHg.   3. Left atrial size was severely dilated.   4. Right atrial size was severely dilated.   5. The mitral valve is abnormal. Moderate to severe mitral valve  regurgitation with restriction of the posterior mitral leaflet. No  evidence of mitral stenosis.   6. The tricuspid valve is abnormal. Tricuspid valve regurgitation is  severe (torrential), ? impaired valve movement due to pacemaker. Systolic  flow reversal in the hepatic vein PW doppler pattern.   7. [employer] mechanical aortic valve. Mean  gradient 18 mmHg with  trivial aortic insufficiency.   8. Aortic dilatation noted. There is mild dilatation of the ascending  aorta, measuring 40 mm.   9. The inferior vena cava is dilated in size with <50% respiratory  variability, suggesting right atrial pressure of 15 mmHg.   Comparison(s): 12/31/19 EF 40-45%. PA pressure . AV mean PG,  peak PG.  Echocardiogram 02/16/2022  IMPRESSIONS     1. Left ventricular ejection fraction, by estimation, is 50 to 55%. Left  ventricular ejection fraction by PLAX is 52 %. The left ventricle has low  normal function. The left ventricle demonstrates regional wall motion  abnormalities (see scoring  diagram/findings for description). There is severe concentric left  ventricular hypertrophy. Left ventricular diastolic parameters are  consistent with Grade II diastolic dysfunction (pseudonormalization).  Elevated left ventricular end-diastolic pressure.   2. Right ventricular systolic function is moderately reduced. The right  ventricular size is moderately enlarged. There is mildly elevated  pulmonary artery systolic pressure. The estimated right ventricular  systolic pressure is 40.8 mmHg.   3. Left atrial size was moderately dilated.   4. Right atrial size was severely dilated.   5. The mitral valve is abnormal. Moderate to severe mitral valve  regurgitation. Moderate mitral annular calcification.   6. The tricuspid valve is abnormal. Tricuspid valve regurgitation is  torrential.   7. The aortic valve has been repaired/replaced. Aortic valve  regurgitation is trivial. There is a 23 mm [employer] Mechanical valve  present in the aortic position. Aortic valve area, by VTI measures 1.10  cm. Aortic valve mean gradient measures 11.6  mmHg. Aortic valve Vmax measures 2.45 m/s. Peak gradient 24.1 mmHg, DI  0.29.   8. The inferior vena cava is dilated in size with <50% respiratory  variability, suggesting right atrial  pressure of 15 mmHg.   Comparison(s): Changes from prior study are noted. 03/14/2021: LVEF 50-55%,  AVR with 9 mmHg mean gradient.   FINDINGS   Left Ventricle: Left ventricular ejection fraction, by estimation, is 50  to 55%. Left ventricular ejection fraction by PLAX is 52 %. The left  ventricle has low normal function. The left ventricle demonstrates  regional wall motion abnormalities. The left   ventricular internal cavity size was normal in size. There is severe  concentric left ventricular hypertrophy. Abnormal (paradoxical) septal  motion, consistent with RV pacemaker. Left ventricular diastolic  parameters are consistent with Grade II  diastolic dysfunction (pseudonormalization). Elevated left ventricular  end-diastolic  pressure.   Right Ventricle: The right ventricular size is moderately enlarged. No  increase in right ventricular wall thickness. Right ventricular systolic  function is moderately reduced. There is mildly elevated pulmonary artery  systolic pressure. The tricuspid  regurgitant velocity is 2.54 m/s, and with an assumed right atrial  pressure of 15 mmHg, the estimated right ventricular systolic pressure is  40.8 mmHg.   Left Atrium: Left atrial size was moderately dilated.   Right Atrium: Right atrial size was severely dilated.   Pericardium: There is no evidence of pericardial effusion.   Mitral Valve: The mitral valve is abnormal. There is mild calcification of  the posterior mitral valve leaflet(s). Moderate mitral annular  calcification. Moderate to severe mitral valve regurgitation.   Tricuspid Valve: The tricuspid valve is abnormal. Tricuspid valve  regurgitation is torrential.   Aortic Valve: The aortic valve has been repaired/replaced. Aortic valve  regurgitation is trivial. Aortic valve mean gradient measures 11.6 mmHg.  Aortic valve peak gradient measures 24.1 mmHg. Aortic valve area, by VTI  measures 1.10 cm. There is a 23 mm  [employer]  Mechanical valve present in the aortic position.   Pulmonic Valve: The pulmonic valve was normal in structure. Pulmonic valve  regurgitation is not visualized.   Aorta: The aortic root and ascending aorta are structurally normal, with  no evidence of dilitation.   Venous: The inferior vena cava is dilated in size with less than 50%  respiratory variability, suggesting right atrial pressure of 15 mmHg.   IAS/Shunts: There is right bowing of the interatrial septum, suggestive of  elevated left atrial pressure. No atrial level shunt detected by color  flow Doppler.   Additional Comments: A device lead is visualized.    Assessment & Plan   1.  Chronic Diastolic CHF-weight today 249lbs.  Euvolemic.  Previously did not tolerate Entresto due to hyperkalemia.  Also previously did not tolerate spironolactone due to hyperkalemia.  Echocardiogram 1/23 showed normal LVEF, G2 DD, well-functioning prosthetic aortic valve.  Repeat echocardiogram 02/16/2022 was unchanged from previous. Increase furosemide to 40 mg x 3 days then resume 40 mg dosing Repeat BMP in 1 week Daily weights-contact office with weight increase of 2 to 3 pounds overnight or 5 pounds in 1 week. Heart healthy low-sodium diet-reviewed Maintain physical activity Repeat echocardiogram 1/25  Complete heart block-status post [facility].  Denies episodes of lightheadedness, presyncope or syncope. Follows with EP  Aortic valve stenosis-  Status post aortic valve replacement  AVR 2006.  Echocardiogram 12/23 reassuring  Continue to monitor  Coronary artery disease-denies chest pain.   Continue metoprolol, simvastatin Heart healthy low-sodium diet-reviewed Increase physical activity as tolerated  Hyperlipidemia- 07/19/2022: Cholesterol, Total 148; HDL 96; LDL Chol Calc (NIH) 37; Triglycerides 80 Continue simvastatin High-fiber diet Repeat fasting lipids and LFTs 5/25    Disposition: Follow-up with Dr. Allyson Sabal or me in 3  weeks.  Thomasene Ripple. Jamas Jaquay NP-C    10/29/2022, 4:21 PM [facility] [address] Office [phone] Fax [phone]  Notice: This dictation was prepared with Dragon dictation along with smaller phrase technology. Any transcriptional errors that result from this process are unintentional and may not be corrected upon review.  I spent 14 minutes examining this patient, reviewing medications, and using patient centered shared decision making involving her cardiac care.  Prior to her visit I spent greater than 20 minutes reviewing her past medical history,  medications, and prior cardiac tests.

## 2022-10-29 ENCOUNTER — Ambulatory Visit: Payer: Medicare Other | Attending: General Practice | Admitting: General Practice

## 2022-10-29 ENCOUNTER — Encounter: Payer: Self-pay | Admitting: General Practice

## 2022-10-29 VITALS — BP 122/70 | HR 90 | Ht 65.0 in | Wt 249.2 lb

## 2022-10-29 DIAGNOSIS — I442 Atrioventricular block, complete: Secondary | ICD-10-CM | POA: Diagnosis not present

## 2022-10-29 DIAGNOSIS — E785 Hyperlipidemia, unspecified: Secondary | ICD-10-CM

## 2022-10-29 DIAGNOSIS — I2510 Atherosclerotic heart disease of native coronary artery without angina pectoris: Secondary | ICD-10-CM | POA: Diagnosis not present

## 2022-10-29 DIAGNOSIS — I5031 Acute diastolic (congestive) heart failure: Secondary | ICD-10-CM

## 2022-10-29 DIAGNOSIS — Z952 Presence of prosthetic heart valve: Secondary | ICD-10-CM | POA: Diagnosis not present

## 2022-10-29 DIAGNOSIS — I5032 Chronic diastolic (congestive) heart failure: Secondary | ICD-10-CM

## 2022-10-29 MED ORDER — FUROSEMIDE 40 MG PO TABS
40.0000 mg | ORAL_TABLET | Freq: Every day | ORAL | 2 refills | Status: AC
Start: 2022-10-29 — End: ?

## 2022-10-29 NOTE — Patient Instructions (Addendum)
Medication Instructions:  INCREASE YOUR LASIX/FUROSEMIDE 80MG  DAILY x3DAYS THEN BACK TO 40MG  DAILY  *If you need a refill on your cardiac medications before your next appointment, please call your pharmacy  Lab Work: BMET IN 1 WEEK If you have labs (blood work) drawn today and your tests are completely normal, you will receive your results only by:  MyChart Message (if you have MyChart) OR  A paper copy in the mail If you have any lab test that is abnormal or we need to change your treatment, we will call you to review the results.  Other Instructions TAKE AND LOG YOUR WEIGHT DAILY AND BRING LOG WITH YOU TO FOLLOW UP APPOINTMENT PLEASE READ AND FOLLOW ATTACHED  SALTY 6  Follow-Up: At [facility], you and your health needs are our priority.  As part of our continuing mission to provide you with exceptional heart care, we have created designated Provider Care Teams.  These Care Teams include your primary Cardiologist (physician) and Advanced Practice Providers (APPs -  Physician Assistants and Nurse Practitioners) who all work together to provide you with the care you need, when you need it.  We recommend signing up for the patient portal called "MyChart".  Sign up information is provided on this After Visit Summary.  MyChart is used to connect with patients for Virtual Visits (Telemedicine).  Patients are able to view lab/test results, encounter notes, upcoming appointments, etc.  Non-urgent messages can be sent to your provider as well.   To learn more about what you can do with MyChart, go to [email or web address].    Your next appointment:   2-3 week(s)  Provider:   Nanetta Batty, MD  or Edd Fabian, FNP

## 2022-10-31 NOTE — Addendum Note (Signed)
Addended by: Ronney Asters on: 10/31/2022 11:25 AM   Modules accepted: Level of Service

## 2022-11-01 ENCOUNTER — Telehealth: Payer: Self-pay

## 2022-11-01 ENCOUNTER — Ambulatory Visit (INDEPENDENT_AMBULATORY_CARE_PROVIDER_SITE_OTHER): Payer: Medicare Other | Admitting: *Deleted

## 2022-11-01 DIAGNOSIS — Z5181 Encounter for therapeutic drug level monitoring: Secondary | ICD-10-CM | POA: Diagnosis not present

## 2022-11-01 DIAGNOSIS — Z952 Presence of prosthetic heart valve: Secondary | ICD-10-CM

## 2022-11-01 LAB — POCT INR: INR: 3.2 — AB (ref 2.0–3.0)

## 2022-11-01 NOTE — Patient Instructions (Signed)
Description   SELF TESTER-Spoke with pt and instructed her to continue taking Warfarin 1 tablet daily EXCEPT 1.5 tablets on Mondays, Wednesdays, and Fridays.  Recheck INR in 2 weeks.  Coumadin Clinic [phone]

## 2022-11-01 NOTE — Telephone Encounter (Signed)
INR due. Called pt, no answer. Left message on voicemail.

## 2022-11-14 ENCOUNTER — Ambulatory Visit: Payer: Medicare Other | Attending: Cardiology | Admitting: Cardiology

## 2022-11-14 ENCOUNTER — Encounter: Payer: Self-pay | Admitting: Cardiology

## 2022-11-14 VITALS — BP 130/72 | HR 58 | Ht 65.0 in | Wt 242.0 lb

## 2022-11-14 DIAGNOSIS — I4819 Other persistent atrial fibrillation: Secondary | ICD-10-CM | POA: Diagnosis not present

## 2022-11-14 DIAGNOSIS — Z79899 Other long term (current) drug therapy: Secondary | ICD-10-CM

## 2022-11-14 DIAGNOSIS — D6869 Other thrombophilia: Secondary | ICD-10-CM

## 2022-11-14 DIAGNOSIS — I5032 Chronic diastolic (congestive) heart failure: Secondary | ICD-10-CM

## 2022-11-14 DIAGNOSIS — I442 Atrioventricular block, complete: Secondary | ICD-10-CM

## 2022-11-14 LAB — CUP PACEART INCLINIC DEVICE CHECK
Battery Remaining Longevity: 7 mo
Battery Voltage: 2.75 V
Brady Statistic RA Percent Paced: 62 %
Brady Statistic RV Percent Paced: 99.9 %
Date Time Interrogation Session: 20240925151901
Implantable Lead Connection Status: 753985
Implantable Lead Connection Status: 753985
Implantable Lead Implant Date: 20150116
Implantable Lead Implant Date: 20150116
Implantable Lead Location: 753859
Implantable Lead Location: 753860
Implantable Lead Model: 5076
Implantable Pulse Generator Implant Date: 20150116
Lead Channel Impedance Value: 375 Ohm
Lead Channel Impedance Value: 587.5 Ohm
Lead Channel Pacing Threshold Amplitude: 0.5 V
Lead Channel Pacing Threshold Amplitude: 0.5 V
Lead Channel Pacing Threshold Amplitude: 0.5 V
Lead Channel Pacing Threshold Amplitude: 0.5 V
Lead Channel Pacing Threshold Pulse Width: 0.4 ms
Lead Channel Pacing Threshold Pulse Width: 0.4 ms
Lead Channel Pacing Threshold Pulse Width: 0.4 ms
Lead Channel Pacing Threshold Pulse Width: 0.4 ms
Lead Channel Sensing Intrinsic Amplitude: 10.2 mV
Lead Channel Sensing Intrinsic Amplitude: 4.7 mV
Lead Channel Setting Pacing Amplitude: 2 V
Lead Channel Setting Pacing Amplitude: 2.5 V
Lead Channel Setting Pacing Pulse Width: 0.4 ms
Lead Channel Setting Sensing Sensitivity: 2 mV
Pulse Gen Model: 2240
Pulse Gen Serial Number: 7587004

## 2022-11-14 LAB — BASIC METABOLIC PANEL
BUN/Creatinine Ratio: 23 (ref 12–28)
BUN: 41 mg/dL — ABNORMAL HIGH (ref 8–27)
CO2: 26 mmol/L (ref 20–29)
Calcium: 10 mg/dL (ref 8.7–10.3)
Chloride: 101 mmol/L (ref 96–106)
Creatinine, Ser: 1.77 mg/dL — ABNORMAL HIGH (ref 0.57–1.00)
Glucose: 86 mg/dL (ref 70–99)
Potassium: 4.7 mmol/L (ref 3.5–5.2)
Sodium: 142 mmol/L (ref 134–144)
eGFR: 31 mL/min/{1.73_m2} — ABNORMAL LOW (ref >59)

## 2022-11-14 NOTE — Patient Instructions (Signed)
Medication Instructions:  Your physician recommends that you continue on your current medications as directed. Please refer to the Current Medication list given to you today.  *If you need a refill on your cardiac medications before your next appointment, please call your pharmacy*   Lab Work: Tikosyn surveillance lab today: Magnesium level  If you have labs (blood work) drawn today and your tests are completely normal, you will receive your results only by: MyChart Message (if you have MyChart) OR A paper copy in the mail If you have any lab test that is abnormal or we need to change your treatment, we will call you to review the results.   Testing/Procedures: None ordered   Follow-Up: At [facility], you and your health needs are our priority.  As part of our continuing mission to provide you with exceptional heart care, we have created designated Provider Care Teams.  These Care Teams include your primary Cardiologist (physician) and Advanced Practice Providers (APPs -  Physician Assistants and Nurse Practitioners) who all work together to provide you with the care you need, when you need it.  We recommend signing up for the patient portal called "MyChart".  Sign up information is provided on this After Visit Summary.  MyChart is used to connect with patients for Virtual Visits (Telemedicine).  Patients are able to view lab/test results, encounter notes, upcoming appointments, etc.  Non-urgent messages can be sent to your provider as well.   To learn more about what you can do with MyChart, go to [email or web address].    Remote monitoring is used to monitor your Pacemaker or ICD from home. This monitoring reduces the number of office visits required to check your device to one time per year. It allows [place] to keep an eye on the functioning of your device to ensure it is working properly. You are scheduled for a device check from home on 03/11/2023. You may send your transmission at any  time that day. If you have a wireless device, the transmission will be sent automatically. After your physician reviews your transmission, you will receive a postcard with your next transmission date.  Your next appointment:   6 month(s)  The format for your next appointment:   In Person  Provider:   You will follow up in the Atrial Fibrillation Clinic located at [facility]. Your provider will be: Clint R. Fenton, PA-C  Or Landry Mellow, PA-C   Thank you for choosing [facility]!!   Dory Horn, RN [phone]

## 2022-11-14 NOTE — Progress Notes (Signed)
Electrophysiology Office Note:   Date:  11/14/2022  ID:  [name], DOB [birth year], MRN [id number]  Primary Cardiologist: [name] Batty, MD Electrophysiologist: [name] Lemming, MD      History of Present Illness:   [name] is a 72 y.o. female with h/o CAD, hypertension, renal artery stenosis seen today for routine electrophysiology followup.   Since last being seen in our clinic the patient reports doing overall well.  She has no chest pain or shortness of breath.  She is able to do all of her daily activities without restriction..  she denies chest pain, palpitations, dyspnea, PND, orthopnea, nausea, vomiting, dizziness, syncope, edema, weight gain, or early satiety.   Review of systems complete and found to be negative unless listed in HPI.      EP Information / Studies Reviewed:    EKG is not ordered today. EKG from 07/19/22 reviewed which showed AV paced      PPM Interrogation-  reviewed in detail today,  See PACEART report.  Device History: Abbott Dual Chamber PPM implanted 2015 for CHB  Risk Assessment/Calculations:    CHA2DS2-VASc Score = 4   This indicates a 4.8% annual risk of stroke. The patient's score is based upon: CHF History: 1 HTN History: 1 Diabetes History: 0 Stroke History: 0 Vascular Disease History: 0 Age Score: 1 Gender Score: 1             Physical Exam:   VS:  BP 130/72 (BP Location: Right Arm, Patient Position: Sitting, Cuff Size: Large)   Pulse (!) 58   Ht 5\' 5"  (1.651 m)   Wt 242 lb (109.8 kg)   SpO2 97%   BMI 40.27 kg/m    Wt Readings from Last 3 Encounters:  11/14/22 242 lb (109.8 kg)  10/29/22 249 lb 3.2 oz (113 kg)  07/19/22 241 lb 3.2 oz (109.4 kg)     GEN: Well nourished, well developed in no acute distress NECK: No JVD; No carotid bruits CARDIAC: Regular rate and rhythm, no murmurs, rubs, gallops RESPIRATORY:  Clear to auscultation without rales, wheezing or rhonchi  ABDOMEN: Soft, non-tender,  non-distended EXTREMITIES:  No edema; No deformity   ASSESSMENT AND PLAN:    CHB s/p Abbott PPM  Normal PPM function See Pace Art report No changes today Battery is 7 months till ERI.  We discussed generator change.  Risks and benefits include bleeding and infection.  She understands the risks and is agreed to the procedure.  2. Persistent atrial fibrillation: currently on Tikosyn.  Minimal atrial fibrillation noted on device interrogation  3. Mechanical AVR: followed by PCP  4. Secondary hypercoagulable state: on eliquis for AF  5. Hypertension: Currently well-controlled  6.  High risk medication monitoring: Currently on dofetilide.  QTc on most recent EKG acceptable.  [name] check magnesium today.  Disposition:   Follow up with Afib Clinic in 6 months  Signed, [name] Jorja Loa, MD

## 2022-11-15 ENCOUNTER — Ambulatory Visit (INDEPENDENT_AMBULATORY_CARE_PROVIDER_SITE_OTHER): Payer: Medicare Other

## 2022-11-15 DIAGNOSIS — Z5181 Encounter for therapeutic drug level monitoring: Secondary | ICD-10-CM | POA: Diagnosis not present

## 2022-11-15 DIAGNOSIS — Z952 Presence of prosthetic heart valve: Secondary | ICD-10-CM

## 2022-11-15 LAB — MAGNESIUM: Magnesium: 2.3 mg/dL (ref 1.6–2.3)

## 2022-11-15 LAB — POCT INR: INR: 3.1 — AB (ref 2.0–3.0)

## 2022-11-15 NOTE — Patient Instructions (Signed)
Description   SELF TESTER-Spoke with pt and instructed her to continue taking Warfarin 1 tablet daily EXCEPT 1.5 tablets on Mondays, Wednesdays, and Fridays.  Recheck INR in 2 weeks.  Coumadin Clinic [phone]

## 2022-11-18 NOTE — Progress Notes (Unsigned)
Cardiology Clinic Note   Patient Name: [name] Date of Encounter: 11/18/2022  Primary Care Provider:  Harvie Heck, MD Primary Cardiologist:  Nanetta Batty, MD  Patient Profile    [name] 72 year old female presents to the clinic today for follow-up evaluation of her acute on chronic combined systolic and diastolic CHF.  Past Medical History    Past Medical History:  Diagnosis Date   Antiphospholipid antibody positive 06/28/2017   Asthma    CAD (coronary artery disease)    a. normal cors by cath in 03/2015   Carotid artery disease (HCC)    Chronic anticoagulation    Complete heart block (HCC) 02/2013   a. Implantation of a dual chamber pacemaker in 02/2013 by Dr Johney Frame. STJ model number 2240 pacemaker with model number 2088 right ventricular lead.    Dyslipidemia    H/O Doppler ultrasound 10/03/2010   carotid duplex - R ICA normal patency; L CCA-ICA bypass gradt patent common to interal cartoid bypass graft    H/O mechanical aortic valve replacement    History of echocardiogram 11/08/2011   EF >55%; mild-mod concentric LVH; trace aortic regurg.; LA mild-mod dilated   History of nuclear stress test 01/20/2010   normal pattern of perfusion; low risk scan   Hypertension    Lupus (systemic lupus erythematosus) (HCC)    PVD (peripheral vascular disease) (HCC)    60% R renal artery stenosis; non-functioning L kidney   Past Surgical History:  Procedure Laterality Date   AORTIC VALVE REPLACEMENT  11/29/006   [facility]; [employer]; on coumadin   CARDIAC CATHETERIZATION  11/01/2004   define anatomy    CARDIAC CATHETERIZATION N/A 04/19/2015   Procedure: Coronary Angiography;  Surgeon: Peter M Swaziland, MD;  Location: [facility];  Service: Cardiovascular;  Laterality: N/A;   CAROTID ENDARTERECTOMY     lt.   CHOLECYSTECTOMY  1999   EXPLORATORY LAPAROTOMY  04/12/2012   small bowel perf; ileostomy & R hemicolectomy    PACEMAKER INSERTION  03/06/2013   SJM Assurity DR  PPM implanted by Dr Johney Frame for complete heart block   PERMANENT PACEMAKER INSERTION N/A 03/06/2013   Procedure: PERMANENT PACEMAKER INSERTION;  Surgeon: Gardiner Rhyme, MD;  Location: [facility] CATH LAB;  Service: Cardiovascular;  Laterality: N/A;   TEMPORARY PACEMAKER INSERTION N/A 03/06/2013   Procedure: TEMPORARY PACEMAKER INSERTION;  Surgeon: Gardiner Rhyme, MD;  Location: [facility] CATH [facility];  Service: Cardiovascular;  Laterality: N/A;   TUBAL LIGATION  11/1982    Allergies  Allergies  Allergen Reactions   Spironolactone Other (See Comments)    Hyperkalemia    History of Present Illness    [name] has a PMH of atrial fibrillation with mechanical AVR on warfarin, hypertension, chronic combined systolic and diastolic CHF, and renal insufficiency.  Her echocardiogram 12/20/2020 showed an LVEF of 30-35%.  She was referred to pharmacy by Dr. Johney Frame for management of her heart failure medication.  She was initiated on Entresto 24-26 and repeat labs showed hyperkalemia.  Her lab work from 01/09/2021 showed a potassium of 5.4.  She was given 2 days of Lokelma and her follow-up lab work on 01/16/2021 showed a potassium of 4.4.      During her follow-up visit with pharmacy 12/30/2020 she shortness of breath with increased physical activity.  She denied chest pain and palpitations.  She was able to complete all of her ADLs.  She had not been checking her blood pressure at home because she felt  her blood pressure cuff was not accurate.  She was noted to have bilateral lower extremity swelling was compliant with her furosemide.  She had been weighing herself daily and reported that it was stable.  Of note she was also unable to tolerate spironolactone in 2018 due to hyperkalemia.  She presented to the clinic 01/24/21 for follow-up evaluation stated she felt well.  She was also seeing nephrology. We reviewed her prior medications and her increased potassium.  She did not tolerate Entresto.  We started  her on  losartan 25 mg daily.  I gave her the [place] support stocking sheet and a salty 6 diet sheet.  We planned follow-up for 1 month with me or pharmacy for up titration of her losartan.  She presented to the clinic 03/03/21 for follow-up evaluation stated she felt well .  She did have a cough over the Christmas holidays.  She felt that she picked up a virus from her granddaughter who was staying with her at the time.  She felt she had recovered well.  Her furosemide was being dosed by nephrology.  She was taking 40 mg daily.  She brought in her BMP  that showed a creatinine of 1.61.  I continued her current dosing of her losartan and planned repeat  echocardiogram.  She reported that she had not been eating out and was avoiding salt.  Her diet made a significant difference in her weight and lower extremity swelling.  She was euvolemic .  And her weight was down another 2 pounds.    We planned her follow-up in 4 months.  She was seen by Dr. Allyson Sabal on 07/12/2021.  She continued to be followed by the A-fib clinic.  Her sinus rhythm has maintained with Tikosyn therapy.  She was compliant with Coumadin therapy.  She continued to do well.  She denied chest pain and shortness of breath.  Her echocardiogram showed normal LV systolic function with G2 DD and well-functioning mechanical aortic valve which had been placed on 03/10/2021.  She was trialed on spironolactone however it was discontinued due to hyperkalemia.  Her Sherryll Burger was also transitioned to losartan.  She presented to the clinic 10/19/21 for follow-up evaluation stated she noticed some blood in the toilet 2 to 3 weeks ago for 1 occurrence.  Then 2 to 3 weeks later she noticed another recurrence of blood in her toilet.  She  had no further occurrences.  She reported that she had occasional dietary indiscretion but tried to be very strict with her low-salt diet.  Over the last week she noted some increased shortness of breath however in the last 2 days her  breathing has been much better.  She had been staying active helping care for her granddaughter who had started middle school.  She did report that over the last 2 to 3 months she had not been walking as much.  I encouraged her to continue her heart healthy low-sodium diet, increase her physical activity as tolerated and we  planned follow-up in 6 months.     She contacted nurse triage line on 01/22/2022 and reported a 8-9 pound weight increase and shortness of breath that had developed over the previous 24 hours.  She presented to clinic 01/31/22 for follow-up evaluation and stated she was feeling much better.  She reported that for Thanksgiving she had ham and then a few days later had leftover ham.  She reported that her weight climbed to 251 pounds and she noted burning with her  breathing.  She took 5 days of double Lasix and her weight  was 245 pounds at her visit.  She  returned to her normal sleeping.  She did note some dizziness that appeared to be related to her vestibular system.  We reviewed her upcoming appointment with Dr.Berry and upcoming echocardiogram.  I ordered a BMP  and had her follow-up as scheduled.  She was seen in follow-up by Bernadene Person NP on 07/19/2022.  She reported and the ED visit.  She had been stable from a cardiac standpoint.  She had stable intermittent palpitations.  She denied significant palpitations.  She denied chest pain, dyspnea, dizziness, lower extremity swelling, and weight gain.  She reported overall feeling well.  She presents to the clinic today for follow-up evaluation and states she has just returned from spending 6 weeks in [place] with her sister.  She enjoyed her time with her sister however she notes that her sister is now caring for a 101-year-old which has increased her stress.  She reports that her sister does not cook and she had dietary indiscretion.  She tried to choose low-fat low-sodium options.  Her weight today is 249.2 pounds.  She has been  taking extra Lasix for the last couple days.  She has noticed a decrease in her lower extremity swelling.  She reports that she is in the process of unpacking and is looking forward to getting back to her normal routine and diet.  I will give her a weight log, increase her furosemide for 3 days, order a BMP and 1 week and plan follow-up in 2 to 3 weeks..  Today she denies chest pain, shortness of breath,  fatigue, palpitations, melena, hematuria, hemoptysis, diaphoresis, weakness, presyncope, syncope, orthopnea, and PND.     Home Medications    Prior to Admission medications   Medication Sig Start Date End Date Taking? Authorizing Provider  acetaminophen (TYLENOL) 325 MG tablet Take 650 mg by mouth every 6 (six) hours as needed for moderate pain.     [provider]  albuterol (PROVENTIL HFA;VENTOLIN HFA) 108 (90 Base) MCG/ACT inhaler Inhale 2 puffs into the lungs every 6 (six) hours as needed for wheezing or shortness of breath. 06/10/17   Jetty Duhamel D, MD  allopurinol (ZYLOPRIM) 100 MG tablet Take 100 mg by mouth daily.     [provider]  calcium carbonate (OSCAL) 1500 (600 Ca) MG TABS tablet Take 1 tablet by mouth 2 (two) times daily.    [provider]  cetirizine (ZYRTEC) 10 MG tablet Take 10 mg by mouth at bedtime.    [provider]  chlorhexidine (PERIDEX) 0.12 % solution Use as directed 10 mLs in the mouth or throat 2 (two) times daily as needed (before dental appts).  03/31/15   [provider]  cholecalciferol (VITAMIN D) 1000 UNITS tablet Take 2,000 Units by mouth 2 (two) times daily.     [provider]  clotrimazole-betamethasone (LOTRISONE) cream as needed. 11/04/18   [provider]  colestipol (COLESTID) 1 g tablet Take 1 g by mouth daily.    [provider]  dicyclomine (BENTYL) 20 MG tablet Take by mouth. 07/15/19   [provider]  dofetilide (TIKOSYN) 250 MCG capsule Take 1 capsule by mouth  twice daily 07/22/20   Fenton, Clint R, PA  Ferrous Fumarate-Folic Acid (HEMOCYTE-F) 324-1 MG TABS Take 1 tablet by mouth daily. 09/20/20   [provider]  fluticasone-salmeterol (WIXELA INHUB) 250-50 MCG/ACT AEPB USE 1 INHALATION  BY MOUTH  twice daily 11/25/20   Jetty Duhamel D, MD  furosemide (LASIX) 20 MG tablet Take 1 tablet (20 mg total) by mouth daily. 10/19/20   Runell Gess, MD  hydroxychloroquine (PLAQUENIL) 200 MG tablet Take 200 mg by mouth daily.    [provider]  isosorbide mononitrate (IMDUR) 30 MG 24 hr tablet TAKE 1 TABLET BY MOUTH  DAILY 11/22/20   Runell Gess, MD  leflunomide (ARAVA) 10 MG tablet Take 10 mg by mouth daily.    [provider]  loratadine (CLARITIN) 10 MG tablet Take 10 mg by mouth daily.    [provider]  Magnesium 200 MG TABS Take 1 tablet (200 mg total) by mouth daily. 04/15/18   Newman Nip, NP  metoprolol succinate (TOPROL-XL) 50 MG 24 hr tablet TAKE 1 TABLET BY MOUTH  DAILY WITH OR IMMEDIATELY  FOLLOWING A MEAL 11/22/20   Runell Gess, MD  potassium chloride SA (KLOR-CON) 20 MEQ tablet TAKE 1  BY MOUTH TWICE DAILY 07/06/20   Newman Nip, NP  predniSONE (DELTASONE) 5 MG tablet Take 5 mg by mouth daily.    [provider]  Probiotic Product (PROBIOTIC DAILY PO) Take 1 tablet by mouth daily.    [provider]  Saccharomyces boulardii (PROBIOTIC) 250 MG CAPS 1 capsule    [provider]  simvastatin (ZOCOR) 20 MG tablet Take 1 tablet by mouth at  bedtime 06/07/15   Runell Gess, MD  sodium zirconium cyclosilicate (LOKELMA) 10 g PACK packet Take 10 g by mouth 2 (two) times daily. 01/10/21   Allred, Fayrene Fearing, MD  warfarin (COUMADIN) 5 MG tablet TAKE 1 TO 1 AND 1/2 TABLETS BY MOUTH DAILY AS DIRECTED  BY COUMADIN CLINIC 08/10/20   Runell Gess, MD    Family History    Family History  Problem Relation Age of Onset   Alzheimer's disease Father    Cirrhosis Father    Cancer  Father        prostate   Heart attack Neg Hx    She indicated that her mother is alive. She indicated that her father is deceased. She indicated that her maternal grandmother is deceased. She indicated that her maternal grandfather is deceased. She indicated that her paternal grandmother is deceased. She indicated that her paternal grandfather is deceased. She indicated that the status of her neg hx is unknown.   Social History    Social History   Socioeconomic History   Marital status: Divorced    Spouse name: Not on file   Number of children: 3   Years of education: Not on file   Highest education level: Not on file  Occupational History   Occupation: Retired  Tobacco Use   Smoking status: Never   Smokeless tobacco: Never  Vaping Use   Vaping status: Never Used  Substance and Sexual Activity   Alcohol use: No   Drug use: No   Sexual activity: Not on file  Other Topics Concern   Not on file  Social History Narrative   Lives alone in [place].   Social Determinants of Health   Financial Resource Strain: Low Risk  (07/04/2022)   Received from [facility]   Overall Financial Resource Strain (CARDIA)    Difficulty of Paying Living Expenses: Not hard at all  Food Insecurity: No Food Insecurity (07/04/2022)   Received from [facility]   Hunger Vital Sign    Worried About  Running Out of Food in the Last Year: Never true    Ran Out of Food in the Last Year: Never true  Transportation Needs: No Transportation Needs (07/04/2022)   Received from [facility], [facility]   PRAPARE - Transportation    Lack of Transportation (Medical): No    Lack of Transportation (Non-Medical): No  Physical Activity: Insufficiently Active (07/04/2022)   Received from [facility], [facility]   Exercise Vital Sign    Days of Exercise per Week: 2 days    Minutes of Exercise per Session: 30 min  Stress: No Stress Concern Present (07/04/2022)   Received  from [facility] Occupational [facility] - Occupational Stress Questionnaire    Feeling of Stress : Not at all  Social Connections: Moderately Integrated (07/04/2022)   Received from [facility], [facility]   Social Network    How would you rate your social network (family, work, friends)?: Adequate participation with social networks  Intimate Partner Violence: Not At Risk (07/04/2022)   Received from [facility], [facility]   HITS    Over the last 12 months how often did your partner physically hurt you?: 1    Over the last 12 months how often did your partner insult you or talk down to you?: 1    Over the last 12 months how often did your partner threaten you with physical harm?: 1    Over the last 12 months how often did your partner scream or curse at you?: 1     Review of Systems    General:  No chills, fever, night sweats or weight changes.  Cardiovascular:  No chest pain, dyspnea on exertion, edema, orthopnea, palpitations, paroxysmal nocturnal dyspnea. Dermatological: No rash, lesions/masses Respiratory: No cough, dyspnea Urologic: No hematuria, dysuria Abdominal:   No nausea, vomiting, diarrhea, bright red blood per rectum, melena, or hematemesis Neurologic:  No visual changes, wkns, changes in mental status. All other systems reviewed and are otherwise negative except as noted above.  Physical Exam    VS:  There were no vitals taken for this visit. , BMI There is no height or weight on file to calculate BMI. GEN: Well nourished, well developed, in no acute distress. HEENT: normal. Neck: Supple, no JVD, carotid bruits, or masses. Cardiac: RRR, no murmurs, rubs, or gallops. No clubbing, cyanosis, left greater than right 2+ lower extremity pitting edema.  Radials/DP/PT 2+ and equal bilaterally.  Respiratory:  Respirations regular and unlabored, clear to auscultation bilaterally. GI: Soft, nontender, nondistended, BS + x 4. MS: no  deformity or atrophy. Skin: warm and dry, no rash. Neuro:  Strength and sensation are intact. Psych: Normal affect.  Accessory Clinical Findings    Recent Labs: 07/05/2022: Hemoglobin 12.0; Platelets 182 07/19/2022: ALT 21 11/13/2022: BUN 41; Creatinine, Ser 1.77; Potassium 4.7; Sodium 142 11/14/2022: Magnesium 2.3   Recent Lipid Panel    Component Value Date/Time   CHOL 148 07/19/2022 0851   TRIG 80 07/19/2022 0851   HDL 96 07/19/2022 0851   CHOLHDL 1.5 07/19/2022 0851   LDLCALC 37 07/19/2022 0851    ECG personally reviewed by me today-none today.  Echocardiogram 12/20/2020  IMPRESSIONS     1. Left ventricular ejection fraction, by estimation, is 30 to 35%. The  left ventricle has moderately decreased function. The left ventricle  demonstrates regional wall motion abnormalities with septal and apical  hypokinesis and septal-lateral  dyssynchrony. The left ventricular internal cavity size was mildly  dilated. There is mild left ventricular hypertrophy. Left ventricular  diastolic parameters are consistent with Grade II diastolic dysfunction  (pseudonormalization).   2. Right ventricular systolic function is mildly reduced. The right  ventricular size is moderately enlarged. There is moderately elevated  pulmonary artery systolic pressure. The estimated right ventricular  systolic pressure is 51.5 mmHg.   3. Left atrial size was severely dilated.   4. Right atrial size was severely dilated.   5. The mitral valve is abnormal. Moderate to severe mitral valve  regurgitation with restriction of the posterior mitral leaflet. No  evidence of mitral stenosis.   6. The tricuspid valve is abnormal. Tricuspid valve regurgitation is  severe (torrential), ? impaired valve movement due to pacemaker. Systolic  flow reversal in the hepatic vein PW doppler pattern.   7. [employer] mechanical aortic valve. Mean gradient 18 mmHg with  trivial aortic insufficiency.   8. Aortic dilatation  noted. There is mild dilatation of the ascending  aorta, measuring 40 mm.   9. The inferior vena cava is dilated in size with <50% respiratory  variability, suggesting right atrial pressure of 15 mmHg.   Comparison(s): 12/31/19 EF 40-45%. PA pressure . AV mean PG,  peak PG.  Echocardiogram 02/16/2022  IMPRESSIONS     1. Left ventricular ejection fraction, by estimation, is 50 to 55%. Left  ventricular ejection fraction by PLAX is 52 %. The left ventricle has low  normal function. The left ventricle demonstrates regional wall motion  abnormalities (see scoring  diagram/findings for description). There is severe concentric left  ventricular hypertrophy. Left ventricular diastolic parameters are  consistent with Grade II diastolic dysfunction (pseudonormalization).  Elevated left ventricular end-diastolic pressure.   2. Right ventricular systolic function is moderately reduced. The right  ventricular size is moderately enlarged. There is mildly elevated  pulmonary artery systolic pressure. The estimated right ventricular  systolic pressure is 40.8 mmHg.   3. Left atrial size was moderately dilated.   4. Right atrial size was severely dilated.   5. The mitral valve is abnormal. Moderate to severe mitral valve  regurgitation. Moderate mitral annular calcification.   6. The tricuspid valve is abnormal. Tricuspid valve regurgitation is  torrential.   7. The aortic valve has been repaired/replaced. Aortic valve  regurgitation is trivial. There is a 23 mm [employer] Mechanical valve  present in the aortic position. Aortic valve area, by VTI measures 1.10  cm. Aortic valve mean gradient measures 11.6  mmHg. Aortic valve Vmax measures 2.45 m/s. Peak gradient 24.1 mmHg, DI  0.29.   8. The inferior vena cava is dilated in size with <50% respiratory  variability, suggesting right atrial pressure of 15 mmHg.   Comparison(s): Changes from prior study are noted.  03/14/2021: LVEF 50-55%,  AVR with 9 mmHg mean gradient.   FINDINGS   Left Ventricle: Left ventricular ejection fraction, by estimation, is 50  to 55%. Left ventricular ejection fraction by PLAX is 52 %. The left  ventricle has low normal function. The left ventricle demonstrates  regional wall motion abnormalities. The left   ventricular internal cavity size was normal in size. There is severe  concentric left ventricular hypertrophy. Abnormal (paradoxical) septal  motion, consistent with RV pacemaker. Left ventricular diastolic  parameters are consistent with Grade II  diastolic dysfunction (pseudonormalization). Elevated left ventricular  end-diastolic pressure.   Right Ventricle: The right ventricular size is moderately enlarged. No  increase in right ventricular wall thickness. Right ventricular systolic  function is  moderately reduced. There is mildly elevated pulmonary artery  systolic pressure. The tricuspid  regurgitant velocity is 2.54 m/s, and with an assumed right atrial  pressure of 15 mmHg, the estimated right ventricular systolic pressure is  40.8 mmHg.   Left Atrium: Left atrial size was moderately dilated.   Right Atrium: Right atrial size was severely dilated.   Pericardium: There is no evidence of pericardial effusion.   Mitral Valve: The mitral valve is abnormal. There is mild calcification of  the posterior mitral valve leaflet(s). Moderate mitral annular  calcification. Moderate to severe mitral valve regurgitation.   Tricuspid Valve: The tricuspid valve is abnormal. Tricuspid valve  regurgitation is torrential.   Aortic Valve: The aortic valve has been repaired/replaced. Aortic valve  regurgitation is trivial. Aortic valve mean gradient measures 11.6 mmHg.  Aortic valve peak gradient measures 24.1 mmHg. Aortic valve area, by VTI  measures 1.10 cm. There is a 23 mm  [employer] Mechanical valve present in the aortic position.   Pulmonic Valve: The  pulmonic valve was normal in structure. Pulmonic valve  regurgitation is not visualized.   Aorta: The aortic root and ascending aorta are structurally normal, with  no evidence of dilitation.   Venous: The inferior vena cava is dilated in size with less than 50%  respiratory variability, suggesting right atrial pressure of 15 mmHg.   IAS/Shunts: There is right bowing of the interatrial septum, suggestive of  elevated left atrial pressure. No atrial level shunt detected by color  flow Doppler.   Additional Comments: A device lead is visualized.    Assessment & Plan   1.  Chronic Diastolic CHF-weight today 249lbs.  Euvolemic.  Previously did not tolerate Entresto due to hyperkalemia.  Also previously did not tolerate spironolactone due to hyperkalemia.  Echocardiogram 1/23 showed normal LVEF, G2 DD, well-functioning prosthetic aortic valve.  Repeat echocardiogram 02/16/2022 was unchanged from previous. Increase furosemide to 40 mg x 3 days then resume 40 mg dosing Repeat BMP in 1 week Daily weights-contact office with weight increase of 2 to 3 pounds overnight or 5 pounds in 1 week. Heart healthy low-sodium diet-reviewed Maintain physical activity Repeat echocardiogram 1/25  Complete heart block-status post [facility].  Denies episodes of lightheadedness, presyncope or syncope. Follows with EP  Aortic valve stenosis-  Status post aortic valve replacement  AVR 2006.  Echocardiogram 12/23 reassuring  Continue to monitor  Coronary artery disease-denies chest pain.   Continue metoprolol, simvastatin Heart healthy low-sodium diet-reviewed Increase physical activity as tolerated  Hyperlipidemia- 07/19/2022: Cholesterol, Total 148; HDL 96; LDL Chol Calc (NIH) 37; Triglycerides 80 Continue simvastatin High-fiber diet Repeat fasting lipids and LFTs 5/25    Disposition: Follow-up with Dr. Allyson Sabal or me in 3 weeks.  Thomasene Ripple. Kyo Cocuzza NP-C    11/18/2022, 11:42 AM [facility] [address] Office [phone] Fax [phone]  Notice: This dictation was prepared with Dragon dictation along with smaller phrase technology. Any transcriptional errors that result from this process are unintentional and may not be corrected upon review.  I spent 14*** minutes examining this patient, reviewing medications, and using patient centered shared decision making involving her cardiac care.  Prior to her visit I spent greater than 20 minutes reviewing her past medical history,  medications, and prior cardiac tests.

## 2022-11-20 ENCOUNTER — Encounter: Payer: Self-pay | Admitting: General Practice

## 2022-11-20 ENCOUNTER — Ambulatory Visit: Payer: Medicare Other | Attending: General Practice | Admitting: General Practice

## 2022-11-20 VITALS — BP 120/80 | HR 72 | Ht 65.0 in | Wt 242.0 lb

## 2022-11-20 DIAGNOSIS — I2510 Atherosclerotic heart disease of native coronary artery without angina pectoris: Secondary | ICD-10-CM | POA: Diagnosis not present

## 2022-11-20 DIAGNOSIS — E785 Hyperlipidemia, unspecified: Secondary | ICD-10-CM

## 2022-11-20 DIAGNOSIS — I5042 Chronic combined systolic (congestive) and diastolic (congestive) heart failure: Secondary | ICD-10-CM | POA: Diagnosis not present

## 2022-11-20 DIAGNOSIS — I442 Atrioventricular block, complete: Secondary | ICD-10-CM | POA: Diagnosis not present

## 2022-11-20 DIAGNOSIS — I351 Nonrheumatic aortic (valve) insufficiency: Secondary | ICD-10-CM

## 2022-11-20 NOTE — Patient Instructions (Signed)
Medication Instructions:  The current medical regimen is effective;  continue present plan and medications as directed. Please refer to the Current Medication list given to you today.  *If you need a refill on your cardiac medications before your next appointment, please call your pharmacy*  Lab Work: NONE If you have labs (blood work) drawn today and your tests are completely normal, you will receive your results only by: MyChart Message (if you have MyChart) OR  A paper copy in the mail If you have any lab test that is abnormal or we need to change your treatment, we will call you to review the results.  Other Instructions CONTINUE DAILY WEIGHTS CONTINUE LOW SALT DIET-ATTACHED CONTINUE PHYSICAL ACTIVITY  Follow-Up: At [facility], you and your health needs are our priority.  As part of our continuing mission to provide you with exceptional heart care, we have created designated Provider Care Teams.  These Care Teams include your primary Cardiologist (physician) and Advanced Practice Providers (APPs -  Physician Assistants and Nurse Practitioners) who all work together to provide you with the care you need, when you need it.  We recommend signing up for the patient portal called "MyChart".  Sign up information is provided on this After Visit Summary.  MyChart is used to connect with patients for Virtual Visits (Telemedicine).  Patients are able to view lab/test results, encounter notes, upcoming appointments, etc.  Non-urgent messages can be sent to your provider as well.   To learn more about what you can do with MyChart, go to [email or web address].    Your next appointment:   6 month(s)  Provider:   Nanetta Batty, MD  or Edd Fabian, FNP

## 2022-11-29 ENCOUNTER — Ambulatory Visit (INDEPENDENT_AMBULATORY_CARE_PROVIDER_SITE_OTHER): Payer: Medicare Other | Admitting: *Deleted

## 2022-11-29 DIAGNOSIS — Z952 Presence of prosthetic heart valve: Secondary | ICD-10-CM | POA: Diagnosis not present

## 2022-11-29 DIAGNOSIS — Z5181 Encounter for therapeutic drug level monitoring: Secondary | ICD-10-CM

## 2022-11-29 LAB — POCT INR: INR: 3.2 — AB (ref 2.0–3.0)

## 2022-11-29 NOTE — Patient Instructions (Signed)
Description   SELF TESTER-Spoke with pt and instructed her to continue taking Warfarin 1 tablet daily EXCEPT 1.5 tablets on Mondays, Wednesdays, and Fridays.  Recheck INR in 2 weeks.  Coumadin Clinic [phone]

## 2022-12-02 ENCOUNTER — Other Ambulatory Visit: Payer: Self-pay | Admitting: Cardiovascular Disease

## 2022-12-02 DIAGNOSIS — I480 Paroxysmal atrial fibrillation: Secondary | ICD-10-CM

## 2022-12-13 ENCOUNTER — Telehealth: Payer: Self-pay | Admitting: *Deleted

## 2022-12-13 ENCOUNTER — Ambulatory Visit (INDEPENDENT_AMBULATORY_CARE_PROVIDER_SITE_OTHER): Payer: Medicare Other | Admitting: *Deleted

## 2022-12-13 DIAGNOSIS — Z952 Presence of prosthetic heart valve: Secondary | ICD-10-CM | POA: Diagnosis not present

## 2022-12-13 DIAGNOSIS — Z5181 Encounter for therapeutic drug level monitoring: Secondary | ICD-10-CM

## 2022-12-13 LAB — POCT INR: INR: 3.2 — AB (ref 2.0–3.0)

## 2022-12-13 NOTE — Telephone Encounter (Signed)
Called pt since INR is due. She states she will get it done soon Will await and follow up.

## 2022-12-21 ENCOUNTER — Other Ambulatory Visit: Payer: Self-pay | Admitting: Cardiovascular Disease

## 2022-12-27 ENCOUNTER — Ambulatory Visit (INDEPENDENT_AMBULATORY_CARE_PROVIDER_SITE_OTHER): Payer: Medicare Other

## 2022-12-27 DIAGNOSIS — Z5181 Encounter for therapeutic drug level monitoring: Secondary | ICD-10-CM

## 2022-12-27 LAB — POCT INR: INR: 2.9 (ref 2.0–3.0)

## 2022-12-27 NOTE — Patient Instructions (Signed)
Description   SELF TESTER-Spoke with pt and instructed her to continue taking Warfarin 1 tablet daily EXCEPT 1.5 tablets on Mondays, Wednesdays, and Fridays.  Recheck INR in 2 weeks.  Coumadin Clinic [phone]

## 2023-01-09 ENCOUNTER — Ambulatory Visit (INDEPENDENT_AMBULATORY_CARE_PROVIDER_SITE_OTHER): Payer: Medicare Other

## 2023-01-09 DIAGNOSIS — I442 Atrioventricular block, complete: Secondary | ICD-10-CM | POA: Diagnosis not present

## 2023-01-09 LAB — CUP PACEART REMOTE DEVICE CHECK
Battery Remaining Longevity: 5 mo
Battery Remaining Percentage: 4 %
Battery Voltage: 2.69 V
Brady Statistic AP VP Percent: 88 %
Brady Statistic AP VS Percent: 1 %
Brady Statistic AS VP Percent: 11 %
Brady Statistic AS VS Percent: 1 %
Brady Statistic RA Percent Paced: 87 %
Brady Statistic RV Percent Paced: 99 %
Date Time Interrogation Session: 20241120030544
Implantable Lead Connection Status: 753985
Implantable Lead Connection Status: 753985
Implantable Lead Implant Date: 20150116
Implantable Lead Implant Date: 20150116
Implantable Lead Location: 753859
Implantable Lead Location: 753860
Implantable Lead Model: 5076
Implantable Pulse Generator Implant Date: 20150116
Lead Channel Impedance Value: 360 Ohm
Lead Channel Impedance Value: 550 Ohm
Lead Channel Pacing Threshold Amplitude: 0.5 V
Lead Channel Pacing Threshold Amplitude: 0.5 V
Lead Channel Pacing Threshold Pulse Width: 0.4 ms
Lead Channel Pacing Threshold Pulse Width: 0.4 ms
Lead Channel Sensing Intrinsic Amplitude: 10.2 mV
Lead Channel Sensing Intrinsic Amplitude: 5 mV
Lead Channel Setting Pacing Amplitude: 2 V
Lead Channel Setting Pacing Amplitude: 2.5 V
Lead Channel Setting Pacing Pulse Width: 0.4 ms
Lead Channel Setting Sensing Sensitivity: 2 mV
Pulse Gen Model: 2240
Pulse Gen Serial Number: 7587004

## 2023-01-10 ENCOUNTER — Telehealth: Payer: Self-pay

## 2023-01-10 ENCOUNTER — Ambulatory Visit (INDEPENDENT_AMBULATORY_CARE_PROVIDER_SITE_OTHER): Payer: Medicare Other | Admitting: *Deleted

## 2023-01-10 DIAGNOSIS — Z5181 Encounter for therapeutic drug level monitoring: Secondary | ICD-10-CM | POA: Diagnosis not present

## 2023-01-10 DIAGNOSIS — Z952 Presence of prosthetic heart valve: Secondary | ICD-10-CM

## 2023-01-10 LAB — POCT INR: INR: 3 (ref 2.0–3.0)

## 2023-01-10 NOTE — Telephone Encounter (Signed)
INR due. Called pt, no answer. Left message on voicemail.

## 2023-01-10 NOTE — Patient Instructions (Signed)
Description   SELF TESTER-Spoke with pt and instructed her to continue taking Warfarin 1 tablet daily EXCEPT 1.5 tablets on Mondays, Wednesdays, and Fridays.  Recheck INR in 2 weeks.  Coumadin Clinic [phone]

## 2023-01-24 ENCOUNTER — Telehealth: Payer: Self-pay | Admitting: *Deleted

## 2023-01-24 NOTE — Telephone Encounter (Signed)
Called pt since INR is due; there was no answer so left a message. Will await a call back and follow up.

## 2023-01-25 ENCOUNTER — Ambulatory Visit (INDEPENDENT_AMBULATORY_CARE_PROVIDER_SITE_OTHER): Payer: Medicare Other | Admitting: *Deleted

## 2023-01-25 ENCOUNTER — Telehealth: Payer: Self-pay | Admitting: *Deleted

## 2023-01-25 DIAGNOSIS — Z5181 Encounter for therapeutic drug level monitoring: Secondary | ICD-10-CM

## 2023-01-25 DIAGNOSIS — Z952 Presence of prosthetic heart valve: Secondary | ICD-10-CM | POA: Diagnosis not present

## 2023-01-25 LAB — POCT INR: INR: 2.6 (ref 2.0–3.0)

## 2023-01-25 NOTE — Telephone Encounter (Signed)
Spoke with pt and she stated she would perform INR testing today and call back. Will follow up.

## 2023-01-25 NOTE — Patient Instructions (Signed)
Description   SELF TESTER-Spoke with pt and instructed her to continue taking Warfarin 1 tablet daily EXCEPT 1.5 tablets on Mondays, Wednesdays, and Fridays.  Recheck INR in 2 weeks.  Coumadin Clinic [phone]

## 2023-02-01 ENCOUNTER — Ambulatory Visit (HOSPITAL_COMMUNITY): Payer: Medicare Other | Attending: Cardiovascular Disease

## 2023-02-01 DIAGNOSIS — Z952 Presence of prosthetic heart valve: Secondary | ICD-10-CM | POA: Insufficient documentation

## 2023-02-01 LAB — ECHOCARDIOGRAM COMPLETE
AV Mean grad: 14 mm[Hg]
AV Peak grad: 28.9 mm[Hg]
Ao pk vel: 2.69 m/s
Area-P 1/2: 4.06 cm2
MV M vel: 4.97 m/s
MV Peak grad: 98.8 mm[Hg]
P 1/2 time: 345 ms
S' Lateral: 4.2 cm

## 2023-02-05 NOTE — Progress Notes (Signed)
Remote pacemaker transmission.   

## 2023-02-06 NOTE — Progress Notes (Unsigned)
Subjective:    Patient ID: [name], female    DOB: 19-Oct-1950, 72 y.o.   MRN: [id number]  HPI female never smoker followed for asthma complicated by history of lupus, aortic valve replacement/ pacemaker/ warfarin. Office spirometry 04/22/15- -------------------------------------------------------------------------------   02/05/22- 72 year old female never smoker followed for Asthma complicated by history of lupus, aortic valve replacement/ pacemaker/ warfarin, obesity, Gout,  -Prednisone 5 mg daily (for lupus), Wixela 250, albuterol hfa Covid vax- 3 Phizer Flu vax-had ACT score -23 Fluid retention at Thanksgiving managed by cardiology and feels she is well controlled now. Using Wixela and albuterol about 1x/ day. Medications discussed.  02/07/23-72 year old female never smoker followed for Asthma complicated by history of lupus, aortic valve replacement/ pacemaker/ warfarin, obesity, Gout,  -Prednisone 5 mg daily (for lupus), Wixela 250, albuterol hfa Discussed the use of AI scribe software for clinical note transcription with the patient, who gave verbal consent to proceed.  History of Present Illness   The patient, with a history of asthma, lupus, and aortic valve repair, presents for a routine follow-up. She reports no recent asthma exacerbations and denies the need for albuterol rescue inhaler. She continues to take prednisone 5mg  daily for lupus and Wixela for asthma. She had a cough 'a long time ago' but it resolved without intervention. She has not been sick recently and denies any new symptoms.  She has a pacemaker and is due for a battery change in the next four to five months. She recently had an ultrasound of her heart and kidneys, but has not received any concerning results. She has received her flu shot but has not yet received the COVID-19 vaccine.     CXR 07/05/22    FINDINGS: Dual lead pacer remains in place. Prior median sternotomy and aortic valve  prosthesis. Atherosclerotic calcification of the aortic arch. Mild enlargement of the cardiopericardial silhouette. The lungs appear clear.  No blunting of the costophrenic angles Right shoulder arthropathy with flattening of the right humeral head and associated sclerosis. IMPRESSION: 1. Mild stable prominence of the cardiopericardial silhouette, without edema. 2. Prior median sternotomy and aortic valve prosthesis. 3. Right shoulder arthropathy with flattening of the right humeral head and associated sclerosis.    Review of Systems- see HPI    + = positive Constitutional:   No-   weight loss, night sweats, fevers, chills, fatigue, lassitude. HEENT:   No-  headaches, difficulty swallowing, tooth/dental problems, sore throat,       No-  sneezing, itching, +ear pressure, + controlled nasal congestion, post nasal drip,  CV:  No-   chest pain, orthopnea, PND, swelling in lower extremities, anasarca,dizziness, palpitations Resp: +shortness of breath with exertion or at rest.              No-   productive cough, + non-productive cough,  No-  coughing up of blood.              No-   change in color of mucus.  No- wheezing.   Skin: No-   rash or lesions. GI:  No-   heartburn, indigestion, abdominal pain, nausea, vomiting,  GU:  MS:  No-   joint pain or swelling.   Neuro- nothing unusual  Psych:  No- change in mood or affect. No depression or anxiety.  No memory loss.  Objective:   Physical Exam General- Alert, Oriented, Affect-appropriate, Distress- none acute;  +obese Skin- rash-none, lesions- none, excoriation- none Lymphadenopathy- none Head- atraumatic  Eyes- Gross vision intact, PERRLA, conjunctivae clear secretions            Ears- Hearing, canals normal            Nose- Clear, no-Septal dev, mucus, polyps, erosion, perforation             Throat- Mallampati III , mucosa clear , drainage- none, tonsils- atrophic Neck- flexible , trachea midline, no stridor , thyroid  nl, carotid no bruit Chest - symmetrical excursion , unlabored           Heart/CV- RRR , +crisp valve sounds,  1-2/6 Systolic AS  Murmur, no gallop  , no rub, nl s1 s2                           - JVD+1 , edema+trace in feet, stasis changes- none, varices- none           Lung- wheeze-none, cough- none , dullness-none, rub- none, rales-none           Chest wall- L pacer Abd-  Br/ Gen/ Rectal- Not done, not indicated Extrem- cyanosis- none, clubbing, none, atrophy- none, strength- nl,        + superficial varices Neuro- grossly intact to observation  Assessment & Plan:  Assessment and Plan    Asthma Mild, intermittent, and uncomplicated. No recent exacerbations. On Wixela and Prednisone 5mg  daily for lupus, which may also help control asthma. Albuterol rescue inhaler not needed recently. -Continue current regimen. -Refill inhalers as needed.  Lupus Stable on Prednisone 5mg  daily. -Continue Prednisone 5mg  daily.  Cardiac History Stable heart murmur likely secondary to aortic valve repair. Pacemaker in place with battery replacement planned for April or May 2025. Recent cardiac ultrasound with no reported abnormalities. -Continue current cardiac care. -Follow up with pacemaker battery replacement as planned.  General Health Maintenance Flu shot received. COVID vaccine needed but unavailable at doctor's office. -Obtain COVID vaccine at local pharmacy when available.

## 2023-02-07 ENCOUNTER — Ambulatory Visit: Payer: Medicare Other | Admitting: Internal Medicine

## 2023-02-07 ENCOUNTER — Ambulatory Visit (INDEPENDENT_AMBULATORY_CARE_PROVIDER_SITE_OTHER): Payer: Medicare Other | Admitting: *Deleted

## 2023-02-07 ENCOUNTER — Encounter: Payer: Self-pay | Admitting: Internal Medicine

## 2023-02-07 VITALS — BP 139/78 | HR 62 | Temp 97.3°F | Ht 65.0 in | Wt 256.4 lb

## 2023-02-07 DIAGNOSIS — Z5181 Encounter for therapeutic drug level monitoring: Secondary | ICD-10-CM

## 2023-02-07 DIAGNOSIS — J45909 Unspecified asthma, uncomplicated: Secondary | ICD-10-CM

## 2023-02-07 DIAGNOSIS — Z952 Presence of prosthetic heart valve: Secondary | ICD-10-CM

## 2023-02-07 DIAGNOSIS — J4520 Mild intermittent asthma, uncomplicated: Secondary | ICD-10-CM

## 2023-02-07 LAB — POCT INR: INR: 3.6 — AB (ref 2.0–3.0)

## 2023-02-07 NOTE — Patient Instructions (Signed)
Glad you are doing so well. We can refill your asthma meds when needed.  Please call if we can help

## 2023-02-07 NOTE — Patient Instructions (Signed)
Description   SELF TESTER-Spoke with pt and instructed her to take 1/2 tablet of warfarin today then continue taking Warfarin 1 tablet daily EXCEPT 1.5 tablets on Mondays, Wednesdays, and Fridays.  Recheck INR in 2 weeks.  [facility] Clinic [phone]

## 2023-02-08 ENCOUNTER — Ambulatory Visit (INDEPENDENT_AMBULATORY_CARE_PROVIDER_SITE_OTHER): Payer: Medicare Other

## 2023-02-08 DIAGNOSIS — I442 Atrioventricular block, complete: Secondary | ICD-10-CM

## 2023-02-08 LAB — CUP PACEART REMOTE DEVICE CHECK
Battery Remaining Longevity: 4 mo
Battery Remaining Percentage: 3 %
Battery Voltage: 2.66 V
Brady Statistic AP VP Percent: 88 %
Brady Statistic AP VS Percent: 1 %
Brady Statistic AS VP Percent: 11 %
Brady Statistic AS VS Percent: 1 %
Brady Statistic RA Percent Paced: 87 %
Brady Statistic RV Percent Paced: 99 %
Date Time Interrogation Session: 20241220031942
Implantable Lead Connection Status: 753985
Implantable Lead Connection Status: 753985
Implantable Lead Implant Date: 20150116
Implantable Lead Implant Date: 20150116
Implantable Lead Location: 753859
Implantable Lead Location: 753860
Implantable Lead Model: 5076
Implantable Pulse Generator Implant Date: 20150116
Lead Channel Impedance Value: 350 Ohm
Lead Channel Impedance Value: 560 Ohm
Lead Channel Pacing Threshold Amplitude: 0.5 V
Lead Channel Pacing Threshold Amplitude: 0.5 V
Lead Channel Pacing Threshold Pulse Width: 0.4 ms
Lead Channel Pacing Threshold Pulse Width: 0.4 ms
Lead Channel Sensing Intrinsic Amplitude: 4.4 mV
Lead Channel Sensing Intrinsic Amplitude: 8.3 mV
Lead Channel Setting Pacing Amplitude: 2 V
Lead Channel Setting Pacing Amplitude: 2.5 V
Lead Channel Setting Pacing Pulse Width: 0.4 ms
Lead Channel Setting Sensing Sensitivity: 2 mV
Pulse Gen Model: 2240
Pulse Gen Serial Number: 7587004

## 2023-02-21 ENCOUNTER — Ambulatory Visit (INDEPENDENT_AMBULATORY_CARE_PROVIDER_SITE_OTHER): Payer: Medicare Other

## 2023-02-21 DIAGNOSIS — Z5181 Encounter for therapeutic drug level monitoring: Secondary | ICD-10-CM | POA: Diagnosis not present

## 2023-02-21 LAB — POCT INR: INR: 2.7 (ref 2.0–3.0)

## 2023-02-21 NOTE — Patient Instructions (Signed)
Description   SELF TESTER-Spoke with pt and instructed her to continue taking Warfarin 1 tablet daily EXCEPT 1.5 tablets on Mondays, Wednesdays, and Fridays.  Recheck INR in 2 weeks.  Coumadin Clinic [phone]

## 2023-03-07 ENCOUNTER — Telehealth: Payer: Self-pay

## 2023-03-07 ENCOUNTER — Ambulatory Visit (INDEPENDENT_AMBULATORY_CARE_PROVIDER_SITE_OTHER): Payer: Medicare Other

## 2023-03-07 DIAGNOSIS — Z5181 Encounter for therapeutic drug level monitoring: Secondary | ICD-10-CM

## 2023-03-07 LAB — POCT INR: INR: 3.6 — AB (ref 2.0–3.0)

## 2023-03-07 NOTE — Patient Instructions (Signed)
Description   SELF TESTER-Spoke with pt and instructed her to eat an extra serving of greens today and continue taking Warfarin 1 tablet daily EXCEPT 1.5 tablets on Mondays, Wednesdays, and Fridays.  Recheck INR in 2 weeks.  [facility]in Clinic [phone]

## 2023-03-07 NOTE — Telephone Encounter (Signed)
Called and spoke with pt. Made her aware INR is due today. Pt states she will check her INR this afternoon.

## 2023-03-12 ENCOUNTER — Ambulatory Visit (INDEPENDENT_AMBULATORY_CARE_PROVIDER_SITE_OTHER): Payer: Medicare Other

## 2023-03-12 DIAGNOSIS — I442 Atrioventricular block, complete: Secondary | ICD-10-CM

## 2023-03-12 LAB — CUP PACEART REMOTE DEVICE CHECK
Battery Remaining Longevity: 1 mo
Battery Remaining Percentage: 1 %
Battery Voltage: 2.63 V
Brady Statistic AP VP Percent: 88 %
Brady Statistic AP VS Percent: 1 %
Brady Statistic AS VP Percent: 12 %
Brady Statistic AS VS Percent: 1 %
Brady Statistic RA Percent Paced: 87 %
Brady Statistic RV Percent Paced: 99 %
Date Time Interrogation Session: 20250121020013
Implantable Lead Connection Status: 753985
Implantable Lead Connection Status: 753985
Implantable Lead Implant Date: 20150116
Implantable Lead Implant Date: 20150116
Implantable Lead Location: 753859
Implantable Lead Location: 753860
Implantable Lead Model: 5076
Implantable Pulse Generator Implant Date: 20150116
Lead Channel Impedance Value: 330 Ohm
Lead Channel Impedance Value: 540 Ohm
Lead Channel Pacing Threshold Amplitude: 0.5 V
Lead Channel Pacing Threshold Amplitude: 0.5 V
Lead Channel Pacing Threshold Pulse Width: 0.4 ms
Lead Channel Pacing Threshold Pulse Width: 0.4 ms
Lead Channel Sensing Intrinsic Amplitude: 3.1 mV
Lead Channel Sensing Intrinsic Amplitude: 9.9 mV
Lead Channel Setting Pacing Amplitude: 2 V
Lead Channel Setting Pacing Amplitude: 2.5 V
Lead Channel Setting Pacing Pulse Width: 0.4 ms
Lead Channel Setting Sensing Sensitivity: 2 mV
Pulse Gen Model: 2240
Pulse Gen Serial Number: 7587004

## 2023-03-12 NOTE — Progress Notes (Signed)
Remote pacemaker transmission.   

## 2023-03-19 ENCOUNTER — Other Ambulatory Visit: Payer: Self-pay | Admitting: Cardiovascular Disease

## 2023-03-21 ENCOUNTER — Ambulatory Visit (INDEPENDENT_AMBULATORY_CARE_PROVIDER_SITE_OTHER): Payer: Medicare Other | Admitting: Pharmacist

## 2023-03-21 ENCOUNTER — Telehealth: Payer: Self-pay | Admitting: *Deleted

## 2023-03-21 DIAGNOSIS — Z952 Presence of prosthetic heart valve: Secondary | ICD-10-CM | POA: Diagnosis not present

## 2023-03-21 DIAGNOSIS — Z5181 Encounter for therapeutic drug level monitoring: Secondary | ICD-10-CM

## 2023-03-21 LAB — POCT INR: INR: 3.7 — AB (ref 2.0–3.0)

## 2023-03-21 NOTE — Telephone Encounter (Signed)
Called pt since INR is due, she states she will take care of it later since she had trouble sleeping last night due to hip pain.

## 2023-03-21 NOTE — Patient Instructions (Signed)
Description   SELF TESTER-Spoke with pt and instructed her to take 1/2 tablet (2.5mg ) today and then continue taking Warfarin 1 tablet daily EXCEPT 1.5 tablets on Mondays, Wednesdays, and Fridays.  Recheck INR in 2 weeks.  [facility] Clinic [phone]

## 2023-04-04 ENCOUNTER — Ambulatory Visit (INDEPENDENT_AMBULATORY_CARE_PROVIDER_SITE_OTHER): Payer: Self-pay

## 2023-04-04 DIAGNOSIS — Z5181 Encounter for therapeutic drug level monitoring: Secondary | ICD-10-CM | POA: Diagnosis not present

## 2023-04-04 LAB — POCT INR: INR: 5.9 — AB (ref 2.0–3.0)

## 2023-04-04 NOTE — Patient Instructions (Signed)
Description   SELF TESTER-Spoke with pt and instructed her to hold today's dose and tomorrow's dose and then START taking Warfarin 1 tablet daily EXCEPT 1.5 tablets on Wednesdays and Fridays.  Recheck INR in 1 week.  [facility] [phone]

## 2023-04-10 ENCOUNTER — Ambulatory Visit (INDEPENDENT_AMBULATORY_CARE_PROVIDER_SITE_OTHER): Payer: Medicare Other

## 2023-04-10 DIAGNOSIS — Z5181 Encounter for therapeutic drug level monitoring: Secondary | ICD-10-CM | POA: Diagnosis not present

## 2023-04-10 DIAGNOSIS — I442 Atrioventricular block, complete: Secondary | ICD-10-CM | POA: Diagnosis not present

## 2023-04-10 LAB — POCT INR: INR: 3.3 — AB (ref 2.0–3.0)

## 2023-04-10 NOTE — Patient Instructions (Signed)
 Description   SELF TESTER-Spoke with pt and instructed her to eat extra greens and only take 1 tablet today. Then resume taking Warfarin 1 tablet daily EXCEPT 1.5 tablets on Wednesdays and Fridays.  Recheck INR on Monday.  C[facility]madin Clinic [phone]

## 2023-04-11 ENCOUNTER — Telehealth: Payer: Self-pay

## 2023-04-11 LAB — CUP PACEART REMOTE DEVICE CHECK
Battery Remaining Longevity: 0 mo
Battery Voltage: 2.59 V
Brady Statistic AP VP Percent: 88 %
Brady Statistic AP VS Percent: 1 %
Brady Statistic AS VP Percent: 12 %
Brady Statistic AS VS Percent: 1 %
Brady Statistic RA Percent Paced: 87 %
Brady Statistic RV Percent Paced: 99 %
Date Time Interrogation Session: 20250220054839
Implantable Lead Connection Status: 753985
Implantable Lead Connection Status: 753985
Implantable Lead Implant Date: 20150116
Implantable Lead Implant Date: 20150116
Implantable Lead Location: 753859
Implantable Lead Location: 753860
Implantable Lead Model: 5076
Implantable Pulse Generator Implant Date: 20150116
Lead Channel Impedance Value: 360 Ohm
Lead Channel Impedance Value: 580 Ohm
Lead Channel Pacing Threshold Amplitude: 0.5 V
Lead Channel Pacing Threshold Amplitude: 0.5 V
Lead Channel Pacing Threshold Pulse Width: 0.4 ms
Lead Channel Pacing Threshold Pulse Width: 0.4 ms
Lead Channel Sensing Intrinsic Amplitude: 12 mV
Lead Channel Sensing Intrinsic Amplitude: 4.1 mV
Lead Channel Setting Pacing Amplitude: 2 V
Lead Channel Setting Pacing Amplitude: 2.5 V
Lead Channel Setting Pacing Pulse Width: 0.4 ms
Lead Channel Setting Sensing Sensitivity: 2 mV
Pulse Gen Model: 2240
Pulse Gen Serial Number: 7587004

## 2023-04-11 NOTE — Telephone Encounter (Signed)
 I do see that. Roanna Raider, is that ok that is was discussed in September 2024?

## 2023-04-11 NOTE — Telephone Encounter (Signed)
 Alert received from [employer] for pacemaker reached the elective replace indicator 04/10/2023.  Patient needs apt to discuss gen change.

## 2023-04-12 ENCOUNTER — Ambulatory Visit (INDEPENDENT_AMBULATORY_CARE_PROVIDER_SITE_OTHER): Payer: Medicare Other

## 2023-04-12 DIAGNOSIS — I442 Atrioventricular block, complete: Secondary | ICD-10-CM

## 2023-04-12 NOTE — Telephone Encounter (Signed)
 Should be ok, but will add [place] for confirmation/approval to schedule gen change vs needs appt prior....Marland KitchenMarland Kitchen

## 2023-04-15 ENCOUNTER — Ambulatory Visit (INDEPENDENT_AMBULATORY_CARE_PROVIDER_SITE_OTHER): Payer: Self-pay

## 2023-04-15 ENCOUNTER — Other Ambulatory Visit (HOSPITAL_COMMUNITY): Payer: Self-pay | Admitting: Cardiovascular Disease

## 2023-04-15 DIAGNOSIS — Z5181 Encounter for therapeutic drug level monitoring: Secondary | ICD-10-CM | POA: Diagnosis not present

## 2023-04-15 LAB — POCT INR: INR: 3.2 — AB (ref 2.0–3.0)

## 2023-04-15 NOTE — Patient Instructions (Signed)
 Description   SELF TESTER-Spoke with pt and instructed her to resume taking Warfarin 1 tablet daily EXCEPT 1.5 tablets on Wednesdays and Fridays.  Recheck INR in 1 week.   Coumadin Clinic [phone]

## 2023-04-22 ENCOUNTER — Ambulatory Visit (INDEPENDENT_AMBULATORY_CARE_PROVIDER_SITE_OTHER): Admitting: *Deleted

## 2023-04-22 ENCOUNTER — Telehealth: Payer: Self-pay | Admitting: *Deleted

## 2023-04-22 DIAGNOSIS — Z952 Presence of prosthetic heart valve: Secondary | ICD-10-CM | POA: Diagnosis not present

## 2023-04-22 DIAGNOSIS — Z5181 Encounter for therapeutic drug level monitoring: Secondary | ICD-10-CM | POA: Diagnosis not present

## 2023-04-22 LAB — POCT INR: INR: 5.5 — AB (ref 2.0–3.0)

## 2023-04-22 NOTE — Telephone Encounter (Signed)
 Called pt since INR is due. She states that she would like to do it Thursday and that her PCP asked her to do it then since they put her on new meds. Asked what meds she was taken and she stated fluconazole 200mg  every day x 7 days and started 04/18/23 and Augmentin 875-125mg  1 every day x 10 days. Advised that the fluconazole interacts with wafarin and can cause INR to increase and should check the INR today. She verbalized understanding and will do it soon.

## 2023-04-22 NOTE — Progress Notes (Signed)
 Remote pacemaker transmission.

## 2023-04-22 NOTE — Patient Instructions (Signed)
 Description   SELF TESTER-Spoke with pt and instructed her to not take any warfarin today and no warfarin tomorrow (finishing fluconazole) and have leafy veggies today and tomorrow then continue taking Warfarin 1 tablet daily EXCEPT 1.5 tablets on Wednesdays and Fridays.  Recheck INR in 1 week.   [facility] Clinic [phone]

## 2023-04-29 ENCOUNTER — Telehealth: Payer: Self-pay

## 2023-04-29 ENCOUNTER — Ambulatory Visit (INDEPENDENT_AMBULATORY_CARE_PROVIDER_SITE_OTHER): Admitting: Internal Medicine

## 2023-04-29 DIAGNOSIS — Z952 Presence of prosthetic heart valve: Secondary | ICD-10-CM | POA: Diagnosis not present

## 2023-04-29 DIAGNOSIS — Z5181 Encounter for therapeutic drug level monitoring: Secondary | ICD-10-CM | POA: Diagnosis not present

## 2023-04-29 LAB — POCT INR: INR: 8 — AB (ref 2.0–3.0)

## 2023-04-29 NOTE — Patient Instructions (Addendum)
 Description   SELF TESTER-Spoke with pt and instructed her to not take any warfarin 3/10, 3/11 or 3/12. Then check INR on Thursday 3/13.   Normal dose of warfarin: Warfarin 1 tablet daily EXCEPT 1.5 tablets on Wednesdays and Fridays.   [facility]in Clinic [phone]

## 2023-04-29 NOTE — Telephone Encounter (Signed)
Reminded patient to check INR today

## 2023-05-02 ENCOUNTER — Ambulatory Visit (INDEPENDENT_AMBULATORY_CARE_PROVIDER_SITE_OTHER)

## 2023-05-02 ENCOUNTER — Other Ambulatory Visit: Payer: Self-pay | Admitting: *Deleted

## 2023-05-02 ENCOUNTER — Encounter: Payer: Self-pay | Admitting: *Deleted

## 2023-05-02 ENCOUNTER — Telehealth: Payer: Self-pay

## 2023-05-02 DIAGNOSIS — Z79899 Other long term (current) drug therapy: Secondary | ICD-10-CM

## 2023-05-02 DIAGNOSIS — I4819 Other persistent atrial fibrillation: Secondary | ICD-10-CM

## 2023-05-02 DIAGNOSIS — Z950 Presence of cardiac pacemaker: Secondary | ICD-10-CM

## 2023-05-02 DIAGNOSIS — Z5181 Encounter for therapeutic drug level monitoring: Secondary | ICD-10-CM | POA: Diagnosis not present

## 2023-05-02 DIAGNOSIS — I442 Atrioventricular block, complete: Secondary | ICD-10-CM

## 2023-05-02 LAB — POCT INR: INR: 7 — AB (ref 2.0–3.0)

## 2023-05-02 NOTE — Telephone Encounter (Signed)
 Pt scheduled for PPM Gen Change with Dr. Elberta Fortis on 4/4 at 2:30 pm.   Message sent to Coumadin clinic.Marland KitchenMarland Kitchen

## 2023-05-02 NOTE — Telephone Encounter (Signed)
-----   Message from [facility] I sent at 05/02/2023  4:01 PM EDT ----- Regarding: Gen change 05/24/23 Scheduled pt for a gen change on 05/24/23. Pt on coumadin for mechanical valve. Please coordinate bridging with patient accordingly.  Thanks,  Mindy

## 2023-05-02 NOTE — Telephone Encounter (Signed)
 Pt scheduled for gen change on 05/24/23 and will need bridge. Pt is self tester for INR - note placed on upcoming INR check to coordinate Lovenox bridging.

## 2023-05-02 NOTE — Patient Instructions (Signed)
 Description   SELF TESTER-Spoke with pt and instructed her to eat greens and not take any warfarin 3/13, 3/14, 3/15, and 3/16.  PLEASE SEEK IMMEDIATE MEDICAL ATTENTION IF YOU EXPERIENCE ANY SIGNS OR SYMPTOMS OF BLEEDING.  Normal dose of warfarin: Warfarin 1 tablet daily EXCEPT 1.5 tablets on Wednesdays and Fridays.  Coumadin Clinic [phone]

## 2023-05-06 ENCOUNTER — Ambulatory Visit (INDEPENDENT_AMBULATORY_CARE_PROVIDER_SITE_OTHER): Admitting: *Deleted

## 2023-05-06 DIAGNOSIS — Z952 Presence of prosthetic heart valve: Secondary | ICD-10-CM

## 2023-05-06 DIAGNOSIS — Z5181 Encounter for therapeutic drug level monitoring: Secondary | ICD-10-CM

## 2023-05-06 LAB — POCT INR: INR: 1.5 — AB (ref 2.0–3.0)

## 2023-05-06 NOTE — Patient Instructions (Signed)
 Description   SELF TESTER-Spoke with pt and instructed to take 1.5 tablets of warfarin today and 1.5 tablets of warfarin tomorrow then continue taking warfarin 1 tablet daily EXCEPT 1.5 tablets on Wednesdays and Fridays. Recheck INR on Friday. Coumadin Clinic [phone]

## 2023-05-10 ENCOUNTER — Ambulatory Visit (INDEPENDENT_AMBULATORY_CARE_PROVIDER_SITE_OTHER): Payer: Self-pay

## 2023-05-10 ENCOUNTER — Telehealth: Payer: Self-pay

## 2023-05-10 DIAGNOSIS — Z5181 Encounter for therapeutic drug level monitoring: Secondary | ICD-10-CM

## 2023-05-10 LAB — POCT INR: INR: 2.3 (ref 2.0–3.0)

## 2023-05-10 NOTE — Patient Instructions (Signed)
 Description   SELF TESTER-Spoke with pt and instructed to take 2 tablets of warfarin today and then START taking warfarin 1 tablet daily EXCEPT 1.5 tablets on Mondays, Wednesdays and Fridays.  Recheck INR on Wednesday at [facility] office  [facility] Clinic [phone]

## 2023-05-10 NOTE — Telephone Encounter (Signed)
 Lpm to check INR

## 2023-05-14 ENCOUNTER — Encounter (HOSPITAL_COMMUNITY): Payer: Self-pay | Admitting: Physician Assistant

## 2023-05-14 ENCOUNTER — Ambulatory Visit (HOSPITAL_COMMUNITY)
Admission: RE | Admit: 2023-05-14 | Discharge: 2023-05-14 | Disposition: A | Discharge status: Home / Self Care | Payer: Medicare Other | Source: Ambulatory Visit | Attending: Physician Assistant | Admitting: Physician Assistant

## 2023-05-14 VITALS — BP 116/84 | HR 67 | Ht 65.0 in | Wt 235.8 lb

## 2023-05-14 DIAGNOSIS — I2510 Atherosclerotic heart disease of native coronary artery without angina pectoris: Secondary | ICD-10-CM | POA: Insufficient documentation

## 2023-05-14 DIAGNOSIS — I110 Hypertensive heart disease with heart failure: Secondary | ICD-10-CM | POA: Diagnosis not present

## 2023-05-14 DIAGNOSIS — Z9889 Other specified postprocedural states: Secondary | ICD-10-CM | POA: Insufficient documentation

## 2023-05-14 DIAGNOSIS — Z5181 Encounter for therapeutic drug level monitoring: Secondary | ICD-10-CM | POA: Diagnosis not present

## 2023-05-14 DIAGNOSIS — I4819 Other persistent atrial fibrillation: Secondary | ICD-10-CM | POA: Insufficient documentation

## 2023-05-14 DIAGNOSIS — Z79899 Other long term (current) drug therapy: Secondary | ICD-10-CM | POA: Diagnosis not present

## 2023-05-14 DIAGNOSIS — D6869 Other thrombophilia: Secondary | ICD-10-CM | POA: Insufficient documentation

## 2023-05-14 DIAGNOSIS — E669 Obesity, unspecified: Secondary | ICD-10-CM | POA: Insufficient documentation

## 2023-05-14 DIAGNOSIS — Z6839 Body mass index (BMI) 39.0-39.9, adult: Secondary | ICD-10-CM | POA: Insufficient documentation

## 2023-05-14 DIAGNOSIS — Z7901 Long term (current) use of anticoagulants: Secondary | ICD-10-CM | POA: Diagnosis not present

## 2023-05-14 DIAGNOSIS — Z952 Presence of prosthetic heart valve: Secondary | ICD-10-CM | POA: Insufficient documentation

## 2023-05-14 DIAGNOSIS — I5032 Chronic diastolic (congestive) heart failure: Secondary | ICD-10-CM | POA: Diagnosis not present

## 2023-05-14 DIAGNOSIS — Z950 Presence of cardiac pacemaker: Secondary | ICD-10-CM | POA: Diagnosis not present

## 2023-05-14 LAB — CBC
HCT: 38.5 % (ref 36.0–46.0)
Hemoglobin: 11.8 g/dL — ABNORMAL LOW (ref 12.0–15.0)
MCH: 29.4 pg (ref 26.0–34.0)
MCHC: 30.6 g/dL (ref 30.0–36.0)
MCV: 96 fL (ref 80.0–100.0)
Platelets: 196 10*3/uL (ref 150–400)
RBC: 4.01 MIL/uL (ref 3.87–5.11)
RDW: 16.3 % — ABNORMAL HIGH (ref 11.5–15.5)
WBC: 4.9 10*3/uL (ref 4.0–10.5)
nRBC: 0 % (ref 0.0–0.2)

## 2023-05-14 LAB — BASIC METABOLIC PANEL
Anion gap: 8 (ref 5–15)
BUN: 26 mg/dL — ABNORMAL HIGH (ref 8–23)
CO2: 22 mmol/L (ref 22–32)
Calcium: 8.7 mg/dL — ABNORMAL LOW (ref 8.9–10.3)
Chloride: 107 mmol/L (ref 98–111)
Creatinine, Ser: 1.16 mg/dL — ABNORMAL HIGH (ref 0.44–1.00)
GFR, Estimated: 51 mL/min — ABNORMAL LOW (ref >60)
Glucose, Bld: 103 mg/dL — ABNORMAL HIGH (ref 70–99)
Potassium: 3.8 mmol/L (ref 3.5–5.1)
Sodium: 137 mmol/L (ref 135–145)

## 2023-05-14 LAB — MAGNESIUM: Magnesium: 1.9 mg/dL (ref 1.7–2.4)

## 2023-05-14 NOTE — Progress Notes (Signed)
 Primary Care Physician: Harvie Heck, MD Primary Cardiologist: Dr Allyson Sabal Primary EP: Dr Elberta Fortis  Referring Physician:Dr. Johney Frame   [name] is a 73 y.o. female with a h/o PPM, CAD, mechanical AVR on warfarin, persistent afib, CHB s/p PPM, lupus, renal artery stenosis who presents for follow up in the [facility]. She is on warfarin for stroke prevention. Patient is s/p dofetilide loading 03/2018.  Patient returns for follow up for atrial fibrillation and dofetilide monitoring. Patient reports that she has done well since her last visit. Her PPM has shown <1% afib burden. She denies any bleeding issues on anticoagulation. She is scheduled for a gen change on 05/24/23.  Today, he denies symptoms of palpitations, chest pain, shortness of breath, orthopnea, PND, lower extremity edema, dizziness, presyncope, syncope, snoring, daytime somnolence, bleeding, or neurologic sequela. The patient is tolerating medications without difficulties and is otherwise without complaint today.    Past Medical History:  Diagnosis Date   Antiphospholipid antibody positive 06/28/2017   Asthma    CAD (coronary artery disease)    a. normal cors by cath in 03/2015   Carotid artery disease (HCC)    Chronic anticoagulation    Complete heart block (HCC) 02/2013   a. Implantation of a dual chamber pacemaker in 02/2013 by Dr Johney Frame. STJ model number 2240 pacemaker with model number 2088 right ventricular lead.    Dyslipidemia    H/O Doppler ultrasound 10/03/2010   carotid duplex - R ICA normal patency; L CCA-ICA bypass gradt patent common to interal cartoid bypass graft    H/O mechanical aortic valve replacement    History of echocardiogram 11/08/2011   EF >55%; mild-mod concentric LVH; trace aortic regurg.; LA mild-mod dilated   History of nuclear stress test 01/20/2010   normal pattern of perfusion; low risk scan   Hypertension    Lupus (systemic lupus erythematosus) (HCC)    PVD  (peripheral vascular disease) (HCC)    60% R renal artery stenosis; non-functioning L kidney    Current Outpatient Medications  Medication Sig Dispense Refill   acetaminophen (TYLENOL) 325 MG tablet Take 650 mg by mouth every 6 (six) hours as needed for moderate pain.      albuterol (VENTOLIN HFA) 108 (90 Base) MCG/ACT inhaler Inhale 2 puffs into the lungs every 6 (six) hours as needed for wheezing or shortness of breath. 24 g 3   allopurinol (ZYLOPRIM) 100 MG tablet Take 100 mg by mouth daily.      calcium carbonate (OSCAL) 1500 (600 Ca) MG TABS tablet Take 1 tablet by mouth 2 (two) times daily.     cetirizine (ZYRTEC) 10 MG tablet Take 10 mg by mouth at bedtime.     chlorhexidine (PERIDEX) 0.12 % solution Use as directed 10 mLs in the mouth or throat 2 (two) times daily as needed (before dental appts).   5   cholecalciferol (VITAMIN D) 1000 UNITS tablet Take 2,000 Units by mouth 2 (two) times daily.      cholestyramine (QUESTRAN) 4 g packet Take 1 packet by mouth daily.     clotrimazole-betamethasone (LOTRISONE) cream as needed.     dicyclomine (BENTYL) 20 MG tablet Take by mouth.     dofetilide (TIKOSYN) 250 MCG capsule Take 1 capsule by mouth twice daily 180 capsule 0   Ferrous Fumarate-Folic Acid (HEMOCYTE-F) 324-1 MG TABS Take 1 tablet by mouth daily.     fluticasone-salmeterol (WIXELA INHUB) 250-50 MCG/ACT AEPB USE 1 INHALATION BY MOUTH  twice daily  180 each 3   furosemide (LASIX) 40 MG tablet Take 1 tablet (40 mg total) by mouth daily. Take 80mg  X3 days then back to 40 mg daily (Patient taking differently: Take 40 mg by mouth daily. Once daily) 100 tablet 2   hydroxychloroquine (PLAQUENIL) 200 MG tablet Take 200 mg by mouth daily.     isosorbide mononitrate (IMDUR) 30 MG 24 hr tablet TAKE 1 TABLET BY MOUTH DAILY 100 tablet 2   leflunomide (ARAVA) 10 MG tablet Take 10 mg by mouth daily.     levothyroxine (SYNTHROID) 25 MCG tablet Take 25 mcg by mouth daily.     loratadine (CLARITIN) 10  MG tablet Take 10 mg by mouth daily.     losartan (COZAAR) 25 MG tablet Take 1 tablet by mouth once daily 90 tablet 3   Magnesium 200 MG TABS Take 1 tablet (200 mg total) by mouth daily. 30 each    metoprolol succinate (TOPROL-XL) 50 MG 24 hr tablet TAKE 1 TABLET BY MOUTH DAILY  WITH OR IMMEDIATELY FOLLOWING A  MEAL 100 tablet 2   predniSONE (DELTASONE) 5 MG tablet Take 5 mg by mouth daily.     Probiotic Product (PROBIOTIC DAILY PO) Take 1 tablet by mouth daily.     Saccharomyces boulardii (PROBIOTIC) 250 MG CAPS 1 capsule     simvastatin (ZOCOR) 20 MG tablet Take 1 tablet by mouth at  bedtime 90 tablet 0   sodium zirconium cyclosilicate (LOKELMA) 10 g PACK packet Take 10 g by mouth 2 (two) times daily. 4 packet 0   warfarin (COUMADIN) 5 MG tablet TAKE 1 TO 1 AND 1/2 TABLETS BY  MOUTH DAILY AS DIRECTED BY  COUMADIN CLINIC 150 tablet 2   No current facility-administered medications for this encounter.    ROS- All systems are reviewed and negative except as per the HPI above  Physical Exam: Vitals:   05/14/23 1319  BP: 116/84  Pulse: 67  Weight: 107 kg  Height: 5\' 5"  (1.651 m)    Wt Readings from Last 3 Encounters:  05/14/23 107 kg  02/07/23 116.3 kg  11/20/22 109.8 kg    GEN: Well nourished, well developed in no acute distress CARDIAC: Regular rate and rhythm, no murmurs, rubs, gallops RESPIRATORY:  Clear to auscultation without rales, wheezing or rhonchi  ABDOMEN: Soft, non-tender, non-distended EXTREMITIES:  No edema; No deformity    EKG today demonstrates A sense V paced rhythm Vent. rate 67 BPM PR interval 210 ms QRS duration 214 ms QT/QTcB 494/521 ms   Echo 02/01/23  1. Left ventricular ejection fraction, by estimation, is 50 to 55%. The  left ventricle has low normal function. The left ventricle has no regional  wall motion abnormalities. Septal-latearl dyssynchrony consistent with RV  pacing. The left ventricular internal cavity size was mildly dilated. Left  ventricular diastolic parameters are consistent with Grade II diastolic dysfunction (pseudonormalization).   2. D-shaped interventricular septum suggestive of RV pressure/volume  overload. Right ventricular systolic function is mildly reduced. The right  ventricular size is moderately enlarged. There is mildly elevated  pulmonary artery systolic pressure. The  estimated right ventricular systolic pressure is 39.0 mmHg.   3. Left atrial size was moderately dilated.   4. Right atrial size was severely dilated.   5. The mitral valve is abnormal. Moderate mitral valve regurgitation. No  evidence of mitral stenosis.   6. The tricuspid valve is abnormal. Tricuspid valve regurgitation is  severe.   7. There is a mechanical aortic valve  prosthesis present. Trivial  perivalvular leakage. Mean gradient 14 mmHg, DI 0.65.   8. Aortic dilatation noted. There is mild dilatation of the ascending  aorta, measuring 38 mm.   9. The inferior vena cava is dilated in size with <50% respiratory  variability, suggesting right atrial pressure of 15 mmHg.    CHA2DS2-VASc Score = 4  The patient's score is based upon: CHF History: 1 HTN History: 1 Diabetes History: 0 Stroke History: 0 Vascular Disease History: 0 Age Score: 1 Gender Score: 1       ASSESSMENT AND PLAN: Persistent Atrial Fibrillation (ICD10:  I48.19) The patient's CHA2DS2-VASc score is 4, indicating a 4.8% annual risk of stroke.   PPM shows <1% afib burden. Continue dofetilide 250 mcg BID Continue Toprol 50 mg daily Continue warfarin  Secondary Hypercoagulable State (ICD10:  D68.69) The patient is at significant risk for stroke/thromboembolism based upon her CHA2DS2-VASc Score of 4.  Continue Warfarin (Coumadin). No bleeding issues.   High Risk Medication Monitoring (ICD 10: Z79.899) QT interval on ECG acceptable for dofetilide monitoring in the setting of V pacing. Check bmet/mag/cbc today.  CHB S/p PPM, followed by Dr  Elberta Fortis Scheduled for gen change 05/24/23  VHD AS s/p AVR 2006  HTN Stable on current regimen  Obesity Body mass index is 39.24 kg/m.  Encouraged lifestyle modification  Chronic HFpEF EF 50-55% GDMT per primary cardiology team Fluid status appears stable today    Follow up in the AF clinic in 6 months.    Jorja Loa PA-C Af[facility] [address] [place], [place] [address] [phone]

## 2023-05-14 NOTE — Progress Notes (Signed)
 Remote pacemaker transmission.

## 2023-05-15 ENCOUNTER — Ambulatory Visit (INDEPENDENT_AMBULATORY_CARE_PROVIDER_SITE_OTHER): Admitting: *Deleted

## 2023-05-15 ENCOUNTER — Ambulatory Visit: Payer: Medicare Other | Attending: Cardiovascular Disease | Admitting: Cardiovascular Disease

## 2023-05-15 ENCOUNTER — Encounter: Payer: Self-pay | Admitting: Cardiovascular Disease

## 2023-05-15 VITALS — BP 116/72 | HR 66 | Ht 65.0 in | Wt 236.4 lb

## 2023-05-15 DIAGNOSIS — I5189 Other ill-defined heart diseases: Secondary | ICD-10-CM | POA: Diagnosis not present

## 2023-05-15 DIAGNOSIS — I480 Paroxysmal atrial fibrillation: Secondary | ICD-10-CM | POA: Diagnosis not present

## 2023-05-15 DIAGNOSIS — Z7901 Long term (current) use of anticoagulants: Secondary | ICD-10-CM

## 2023-05-15 DIAGNOSIS — Z952 Presence of prosthetic heart valve: Secondary | ICD-10-CM

## 2023-05-15 DIAGNOSIS — I10 Essential (primary) hypertension: Secondary | ICD-10-CM

## 2023-05-15 DIAGNOSIS — Z5181 Encounter for therapeutic drug level monitoring: Secondary | ICD-10-CM | POA: Diagnosis not present

## 2023-05-15 DIAGNOSIS — I442 Atrioventricular block, complete: Secondary | ICD-10-CM | POA: Diagnosis not present

## 2023-05-15 DIAGNOSIS — I6522 Occlusion and stenosis of left carotid artery: Secondary | ICD-10-CM

## 2023-05-15 DIAGNOSIS — E782 Mixed hyperlipidemia: Secondary | ICD-10-CM

## 2023-05-15 LAB — POCT INR: INR: 3.9 — AB (ref 2.0–3.0)

## 2023-05-15 MED ORDER — ENOXAPARIN SODIUM 100 MG/ML IJ SOSY: 100.0000 mg | PREFILLED_SYRINGE | Freq: Two times a day (BID) | INTRAMUSCULAR | 1 refills | Status: DC

## 2023-05-15 NOTE — Telephone Encounter (Signed)
 Patient with A Fib and mechanical aortic valve. Recommend holding warfarin for 5 days with Lovenox bridge

## 2023-05-15 NOTE — Progress Notes (Signed)
 Remote pacemaker transmission.

## 2023-05-15 NOTE — Assessment & Plan Note (Signed)
 History of hyperlipidemia on simvastatin with lipid profile performed 07/19/2022 revealing total cholesterol 148, LDL 37 and HDL of 96.

## 2023-05-15 NOTE — Assessment & Plan Note (Signed)
 On Coumadin anticoagulation because of [employer] AVR followed here in our clinic.

## 2023-05-15 NOTE — Assessment & Plan Note (Signed)
 History of essential hypertension her blood pressure measured today at 116/72.  She is on losartan and Toprol.

## 2023-05-15 NOTE — Patient Instructions (Addendum)
 Description   SELF TESTER-Do not take take any warfarin today then continue taking warfarin 1 tablet daily EXCEPT 1.5 tablets on Mondays, Wednesdays and Fridays.  Recheck INR in 1 week post procedure-Self tester.   Coumadin Clinic [phone]     05/18/23: Last dose of warfarin.  05/19/23: No warfarin or enoxaparin (Lovenox).  05/20/23: Inject enoxaparin 100mg  in the fatty abdominal tissue at least 2 inches from the belly button twice a day about 12 hours apart, 8am and 8pm rotate sites. No warfarin.  05/21/23: Inject enoxaparin in the fatty tissue every 12 hours, 8am and 8pm. No warfarin.  05/22/23: Inject enoxaparin in the fatty tissue every 12 hours, 8am and 8pm. No warfarin.  05/23/23: Inject enoxaparin in the fatty tissue in the morning at 8am (No PM dose). No warfarin.  05/24/23: Procedure Day - No enoxaparin - Resume warfarin in the evening or as directed by doctor (take an extra half tablet with usual dose total of 2 tablets).  05/25/23: Resume enoxaparin inject in the fatty tissue every 12 hours and take warfarin (take an extra half tablet with usual dose total of 1.5 tablets).  05/26/23: Inject enoxaparin in the fatty tissue every 12 hours and take warfarin.  05/27/23: Inject enoxaparin in the fatty tissue every 12 hours and take warfarin.  05/28/23: Inject enoxaparin in the fatty tissue every 12 hours and take warfarin.  05/29/23: Inject enoxaparin in the fatty tissue every 12 hours and take warfarin.  05/30/23: Inject enoxaparin in the fatty tissue and check INR call to Anticoagulation Clinic [phone].

## 2023-05-15 NOTE — Assessment & Plan Note (Signed)
 History of diastolic heart failure by 2D echo performed 02/01/2023.  She she is euvolemic not on a diuretic.

## 2023-05-15 NOTE — Assessment & Plan Note (Signed)
 History of complete heart block status post permanent transvenous venous pacemaker insertion by Dr. Johney Frame.  She is scheduled for battery change next week by Dr. Elberta Fortis.  EKG shows paced rhythm.

## 2023-05-15 NOTE — Patient Instructions (Signed)
 Medication Instructions:  Your physician recommends that you continue on your current medications as directed. Please refer to the Current Medication list given to you today.  *If you need a refill on your cardiac medications before your next appointment, please call your pharmacy*   Testing/Procedures: Your physician has requested that you have an echocardiogram. Echocardiography is a painless test that uses sound waves to create images of your heart. It provides your doctor with information about the size and shape of your heart and how well your heart's chambers and valves are working. This procedure takes approximately one hour. There are no restrictions for this procedure. Please do NOT wear cologne, perfume, aftershave, or lotions (deodorant is allowed). Please arrive 15 minutes prior to your appointment time. **To do in December**  Please note: We ask at that you not bring children with you during ultrasound (echo/ vascular) testing. Due to room size and safety concerns, children are not allowed in the ultrasound rooms during exams. Our front office staff cannot provide observation of children in our lobby area while testing is being conducted. An adult accompanying a patient to their appointment will only be allowed in the ultrasound room at the discretion of the ultrasound technician under special circumstances. We apologize for any inconvenience.    Follow-Up: At [facility], you and your health needs are our priority.  As part of our continuing mission to provide you with exceptional heart care, we have created designated Provider Care Teams.  These Care Teams include your primary Cardiologist (physician) and Advanced Practice Providers (APPs -  Physician Assistants and Nurse Practitioners) who all work together to provide you with the care you need, when you need it.  We recommend signing up for the patient portal called "MyChart".  Sign up information is provided on this  After Visit Summary.  MyChart is used to connect with patients for Virtual Visits (Telemedicine).  Patients are able to view lab/test results, encounter notes, upcoming appointments, etc.  Non-urgent messages can be sent to your provider as well.   To learn more about what you can do with MyChart, go to [email or web address].    Your next appointment:   12 month(s)  Provider:   Nanetta Batty, MD     Other Instructions   1st Floor: - Lobby - Registration  - Pharmacy  - Lab - Cafe  2nd Floor: - PV Lab - Diagnostic Testing (echo, CT, nuclear med)  3rd Floor: - Vacant  4th Floor: - TCTS (cardiothoracic surgery) - AFib Clinic - Structural Heart Clinic - Vascular Surgery  - Vascular Ultrasound  5th Floor: - HeartCare Cardiology (general and EP) - Clinical Pharmacy for coumadin, hypertension, lipid, weight-loss medications, and med management appointments    Valet parking services will be available as well.

## 2023-05-15 NOTE — Progress Notes (Signed)
 05/15/2023 [name]   04-Jun-1950  [id number]  Primary Physician Harvie Heck, MD Primary Cardiologist: Runell Gess MD FACP, [place], [place], [place]  HPI:  [name] is a 73 y.o.    moderately overweight, almost divorced Caucasian female mother of 3, grandmother to 3 grandchildren who I last saw in the office 04/03/2022.  She had a St. Jude AVR performed by Dr. Ofilia Neas September of 2006 because of severe aortic insufficiency. She had normal coronary arteries by cath at that time. A look at her renal arteries and demonstrated a 60% right renal artery stenosis and patent accessory left renal arteries. By duplex she does have a non-functioning left kidney. Her other problems include hypertension and hyperlipidemia. She also has a history of lupus followed by Dr. Marcy Siren. An echo performed a year ago showed a well-functioning aortic prosthesis, and recent renal Dopplers revealed a widely patent right renal artery. She had a small bowel obstruction and underwent surgery at [facility] for perforated small bowel. She had an exploratory laparotomy with ileostomy and a right hemicolectomy 04/12/12. She saw Huey Bienenstock PAC back in our office 06/12/12 and her medicines were adjusted.   She was admitted in early January for symptomatic complete heart block underwent permanent pacemaker insertion by Dr. Hillis Range.[name] She was remitted approximately week later with orthostatic hypotension and was dehydrated. She responded to fluid resuscitation. Since I saw her a year ago she was admitted with heart failure requiring diuresis secondary to diastolic dysfunction and dietary indiscretion. In addition, she recently underwent an cardiac catheterization by Dr. [place] because of chest pain 04/19/15  revealing normal coronary arteries.  She was admitted to [facility] of volume overload 12/21/15 and was diuresed     She was complaining of increasing dyspnea.  She was found to be in A. fib and was  admitted for Tikosyn load on 04/01/2018.  She converted to sinus rhythm and feels clinically improved.   She  continues to do well ostensibly maintaining sinus rhythm on Tikosyn and warfarin.  She did see Dr. Johney Frame back who confirmed continued improvement.  She does have diastolic heart failure on diuretics with grade 1 diastolic dysfunction on 2D echo 05/24/2017.  Recent carotid and renal Doppler studies showed an occluded left renal artery with a widely patent right renal artery and an occluded left internal carotid with a widely patent right.   She is followed in the A. fib clinic extensively maintaining sinus rhythm after her Tikosyn load.  Her most recent 2D echo performed 08/26/2019 revealed an EF of 40% with a well-functioning aortic mechanical prosthesis on Coumadin anticoagulation.    Since I saw her in the office a year ago she has remained stable.  She is scheduled for generator change out for EOL by Dr. Elberta Fortis next week.  Her 2D echo performed 02/01/2023 showed normal LV systolic function, grade 2 diastolic dysfunction with moderate MR and a well-functioning aortic bioprosthesis.  She denies chest pain or shortness of breath.   Current Meds  Medication Sig   acetaminophen (TYLENOL) 325 MG tablet Take 650 mg by mouth every 6 (six) hours as needed for moderate pain.    albuterol (VENTOLIN HFA) 108 (90 Base) MCG/ACT inhaler Inhale 2 puffs into the lungs every 6 (six) hours as needed for wheezing or shortness of breath.   allopurinol (ZYLOPRIM) 100 MG tablet Take 100 mg by mouth daily.    cetirizine (ZYRTEC) 10 MG tablet Take 10 mg by  mouth at bedtime.   chlorhexidine (PERIDEX) 0.12 % solution Use as directed 10 mLs in the mouth or throat 2 (two) times daily as needed (before dental appts).    cholestyramine (QUESTRAN) 4 g packet Take 1 packet by mouth daily.   clotrimazole-betamethasone (LOTRISONE) cream as needed.   dicyclomine (BENTYL) 20 MG tablet Take by mouth.   dofetilide (TIKOSYN) 250  MCG capsule Take 1 capsule by mouth twice daily   Ferrous Fumarate-Folic Acid (HEMOCYTE-F) 324-1 MG TABS Take 1 tablet by mouth daily.   fluticasone-salmeterol (WIXELA INHUB) 250-50 MCG/ACT AEPB USE 1 INHALATION BY MOUTH  twice daily   furosemide (LASIX) 40 MG tablet Take 1 tablet (40 mg total) by mouth daily. Take 80mg  X3 days then back to 40 mg daily (Patient taking differently: Take 40 mg by mouth daily. Once daily)   hydroxychloroquine (PLAQUENIL) 200 MG tablet Take 200 mg by mouth daily.   isosorbide mononitrate (IMDUR) 30 MG 24 hr tablet TAKE 1 TABLET BY MOUTH DAILY   leflunomide (ARAVA) 10 MG tablet Take 10 mg by mouth daily.   levothyroxine (SYNTHROID) 25 MCG tablet Take 25 mcg by mouth daily.   loratadine (CLARITIN) 10 MG tablet Take 10 mg by mouth daily.   losartan (COZAAR) 25 MG tablet Take 1 tablet by mouth once daily   Magnesium 200 MG TABS Take 1 tablet (200 mg total) by mouth daily.   metoprolol succinate (TOPROL-XL) 50 MG 24 hr tablet TAKE 1 TABLET BY MOUTH DAILY  WITH OR IMMEDIATELY FOLLOWING A  MEAL   predniSONE (DELTASONE) 5 MG tablet Take 5 mg by mouth daily.   Saccharomyces boulardii (PROBIOTIC) 250 MG CAPS 1 capsule   simvastatin (ZOCOR) 20 MG tablet Take 1 tablet by mouth at  bedtime   sodium zirconium cyclosilicate (LOKELMA) 10 g PACK packet Take 10 g by mouth 2 (two) times daily.   warfarin (COUMADIN) 5 MG tablet TAKE 1 TO 1 AND 1/2 TABLETS BY  MOUTH DAILY AS DIRECTED BY  COUMADIN CLINIC   [DISCONTINUED] calcium carbonate (OSCAL) 1500 (600 Ca) MG TABS tablet Take 1 tablet by mouth 2 (two) times daily.   [DISCONTINUED] cholecalciferol (VITAMIN D) 1000 UNITS tablet Take 2,000 Units by mouth 2 (two) times daily.    [DISCONTINUED] Probiotic Product (PROBIOTIC DAILY PO) Take 1 tablet by mouth daily.     Allergies  Allergen Reactions   Spironolactone Other (See Comments)    Hyperkalemia    Social History   Socioeconomic History   Marital status: Divorced    Spouse  name: Not on file   Number of children: 3   Years of education: Not on file   Highest education level: Not on file  Occupational History   Occupation: Retired  Tobacco Use   Smoking status: Never   Smokeless tobacco: Never   Tobacco comments:    Never smoked 05/14/23  Vaping Use   Vaping status: Never Used  Substance and Sexual Activity   Alcohol use: No   Drug use: No   Sexual activity: Not on file  Other Topics Concern   Not on file  Social History Narrative   Lives alone in [place].   Social Drivers of Corporate investment banker Strain: Low Risk  (03/07/2023)   Received from [facility]   Overall Financial Resource Strain (CARDIA)    Difficulty of Paying Living Expenses: Not hard at all  Food Insecurity: No Food Insecurity (03/07/2023)   [facility]   Hunger Vital Sign  Worried About Programme researcher, broadcasting/film/video in the Last Year: Never true    Ran Out of Food in the Last Year: Never true  Transportation Needs: No Transportation Needs (03/07/2023)   Received from [facility] - Transportation    Lack of Transportation (Medical): No    Lack of Transportation (Non-Medical): No  Physical Activity: Insufficiently Active (01/30/2023)   [facility]   Exercise Vital Sign    Days of Exercise per Week: 1 day    Minutes of Exercise per Session: 30 min  Stress: No Stress Concern Present (01/30/2023)   Received from [facility] - Occupational Stress Questionnaire    Feeling of Stress : Only a little  Social Connections: Moderately Integrated (01/30/2023)   [facility] from [facility]   Social Network    How would you rate your social network (family, work, friends)?: Adequate participation with social networks  Intimate Partner Violence: Not At Risk (01/30/2023)   Received from [facility]   HITS    Over the last 12 months how often did your partner physically hurt you?: Never    Over  the last 12 months how often did your partner insult you or talk down to you?: Never    Over the last 12 months how often did your partner threaten you with physical harm?: Never    Over the last 12 months how often did your partner scream or curse at you?: Never     Review of Systems: General: negative for chills, fever, night sweats or weight changes.  Cardiovascular: negative for chest pain, dyspnea on exertion, edema, orthopnea, palpitations, paroxysmal nocturnal dyspnea or shortness of breath Dermatological: negative for rash Respiratory: negative for cough or wheezing Urologic: negative for hematuria Abdominal: negative for nausea, vomiting, diarrhea, bright red blood per rectum, melena, or hematemesis Neurologic: negative for visual changes, syncope, or dizziness All other systems reviewed and are otherwise negative except as noted above.    Blood pressure 116/72, pulse 66, height 5\' 5"  (1.651 m), weight 236 lb 6.4 oz (107.2 kg), SpO2 99%.  General appearance: alert and no distress Neck: no adenopathy, no carotid bruit, no JVD, supple, symmetrical, trachea midline, and thyroid not enlarged, symmetric, no tenderness/mass/nodules Lungs: clear to auscultation bilaterally Heart: Crisp prosthetic heart sound consistent with her mechanical aortic valve. Extremities: extremities normal, atraumatic, no cyanosis or edema Pulses: 2+ and symmetric Skin: Skin color, texture, turgor normal. No rashes or lesions Neurologic: Grossly normal  EKG not performed today      ASSESSMENT AND PLAN:   Anticoagulated on Coumadin for mechanical valve On Coumadin anticoagulation because of Saint Jude AVR followed here in our clinic.  Complete heart block (HCC) History of complete heart block status post permanent transvenous venous pacemaker insertion by Dr. Johney Frame.  She is scheduled for battery change next week by Dr. Elberta Fortis.  EKG shows paced rhythm.  S/P AVR (aortic valve replacement) History of  Saint Jude AVR in 2006 by Dr. Tyrone Sage for severe AI.  Her most recent 2D echo performed 02/01/2023 revealed normal LV systolic function, grade 2 diastolic dysfunction with biatrial enlargement, moderate MR and a well-functioning aortic mechanical prosthesis.  This will be rechecked in 1 year.  Hyperlipidemia History of hyperlipidemia on simvastatin with lipid profile performed 07/19/2022 revealing total cholesterol 148, LDL 37 and HDL of 96.  Essential hypertension History of essential hypertension her blood pressure measured today at 116/72.  She is on losartan and  Toprol.  Carotid stenosis History of left carotid endarterectomy in 2000 with known occlusion.  Last carotid Doppler was in 2023 revealed healing widely patent right internal carotid artery.  Diastolic dysfunction History of diastolic heart failure by 2D echo performed 02/01/2023.  She she is euvolemic not on a diuretic.  PAF (paroxysmal atrial fibrillation) (HCC) History of PAF maintaining sinus rhythm on dofetilide.  She is followed by the A-fib clinic.  Her EKG shows a paced rhythm.Runell Gess MD FACP,FACC,FAHA, [facility] 05/15/2023 1:47 PM

## 2023-05-15 NOTE — Addendum Note (Signed)
 Addended by: Elease Etienne A on: 05/15/2023 11:18 AM   Modules accepted: Orders

## 2023-05-15 NOTE — Assessment & Plan Note (Signed)
 History of PAF maintaining sinus rhythm on dofetilide.  She is followed by the A-fib clinic.  Her EKG shows a paced rhythm.[name]

## 2023-05-15 NOTE — Assessment & Plan Note (Signed)
 History of Saint Jude AVR in 2006 by Dr. Tyrone Sage for severe AI.  Her most recent 2D echo performed 02/01/2023 revealed normal LV systolic function, grade 2 diastolic dysfunction with biatrial enlargement, moderate MR and a well-functioning aortic mechanical prosthesis.  This will be rechecked in 1 year.

## 2023-05-15 NOTE — Assessment & Plan Note (Signed)
 History of left carotid endarterectomy in 2000 with known occlusion.  Last carotid Doppler was in 2023 revealed healing widely patent right internal carotid artery.

## 2023-05-17 ENCOUNTER — Telehealth (HOSPITAL_COMMUNITY): Payer: Self-pay

## 2023-05-17 NOTE — Telephone Encounter (Signed)
 Attempted to reach patient to discuss upcoming procedure, no answer. Left VM for patient to return call.

## 2023-05-17 NOTE — Telephone Encounter (Signed)
 Call placed to patient to discuss upcoming procedure.   Confirmed patient is scheduled for a PPM generator change on Friday, April 4 with Dr. Loman Brooklyn. Instructed patient to arrive at the Main Entrance A at [facility]: [address] [place], [place] [address] and check in at Admitting at 12:30 PM.   Labs completed  Any recent signs of acute illness or been started on antibiotics?  No Any new medications started? No Any medications to hold? Furosemide AM of procedure, follow instructions regarding coumadin/lovenox injections Medication instructions:  On the morning of your procedure take all your other morning medications. No eating or drinking after midnight prior to procedure.   The night before your procedure and the morning of your procedure scrub your neck/chest with the CHG surgical soap.   Advised of plan to go home the same day and will only stay overnight if medically necessary. You MUST have a responsible adult to drive you home and MUST be with you the first 24 hours after you arrive home.  Patient verbalized understanding to all instructions provided and agreed to proceed with procedure.

## 2023-05-23 ENCOUNTER — Telehealth: Payer: Self-pay | Admitting: Cardiology

## 2023-05-23 NOTE — Telephone Encounter (Signed)
 Pt states she cut her hand and got stitches yesterday. She asked if its okay for her to still come to her procedure tomorrow. Please advise.

## 2023-05-23 NOTE — Telephone Encounter (Signed)
 Patient states she cut her finger yesterday and needed stitches. She would like to know if she can keep her appointment for ablation tomorrow or do she need to reschedule?

## 2023-05-23 NOTE — Telephone Encounter (Signed)
 Spoke with patient and she is aware she can keep her procedure appointment

## 2023-05-24 ENCOUNTER — Encounter (HOSPITAL_COMMUNITY)
Admission: RE | Disposition: A | Discharge status: Home / Self Care | Payer: Self-pay | Source: Home / Self Care | Attending: Cardiology

## 2023-05-24 ENCOUNTER — Telehealth: Payer: Self-pay | Admitting: Cardiovascular Disease

## 2023-05-24 ENCOUNTER — Encounter (HOSPITAL_COMMUNITY): Payer: Self-pay | Admitting: Cardiology

## 2023-05-24 ENCOUNTER — Ambulatory Visit (HOSPITAL_COMMUNITY)
Admission: RE | Admit: 2023-05-24 | Discharge: 2023-05-24 | Disposition: A | Discharge status: Home / Self Care | Attending: Cardiology | Admitting: Cardiology

## 2023-05-24 ENCOUNTER — Telehealth: Payer: Self-pay

## 2023-05-24 ENCOUNTER — Other Ambulatory Visit: Payer: Self-pay

## 2023-05-24 DIAGNOSIS — R001 Bradycardia, unspecified: Secondary | ICD-10-CM | POA: Diagnosis not present

## 2023-05-24 DIAGNOSIS — I701 Atherosclerosis of renal artery: Secondary | ICD-10-CM | POA: Diagnosis not present

## 2023-05-24 DIAGNOSIS — Z5181 Encounter for therapeutic drug level monitoring: Secondary | ICD-10-CM

## 2023-05-24 DIAGNOSIS — Z45010 Encounter for checking and testing of cardiac pacemaker pulse generator [battery]: Secondary | ICD-10-CM | POA: Insufficient documentation

## 2023-05-24 DIAGNOSIS — I442 Atrioventricular block, complete: Secondary | ICD-10-CM | POA: Diagnosis not present

## 2023-05-24 DIAGNOSIS — I2510 Atherosclerotic heart disease of native coronary artery without angina pectoris: Secondary | ICD-10-CM | POA: Insufficient documentation

## 2023-05-24 DIAGNOSIS — I4819 Other persistent atrial fibrillation: Secondary | ICD-10-CM

## 2023-05-24 DIAGNOSIS — Z7901 Long term (current) use of anticoagulants: Secondary | ICD-10-CM

## 2023-05-24 HISTORY — PX: PPM GENERATOR CHANGEOUT: EP1233

## 2023-05-24 LAB — PROTIME-INR
INR: 1.5 — ABNORMAL HIGH (ref 0.8–1.2)
Prothrombin Time: 18.1 s — ABNORMAL HIGH (ref 11.4–15.2)

## 2023-05-24 SURGERY — PPM GENERATOR CHANGEOUT

## 2023-05-24 MED ORDER — POVIDONE-IODINE 10 % EX SWAB: 2.0000 | Freq: Once | CUTANEOUS | Status: DC

## 2023-05-24 MED ORDER — ONDANSETRON HCL 4 MG/2ML IJ SOLN: 4.0000 mg | Freq: Four times a day (QID) | INTRAMUSCULAR | Status: DC | PRN

## 2023-05-24 MED ORDER — SODIUM CHLORIDE 0.9 % IV SOLN
INTRAVENOUS | Status: AC
Start: 2023-05-24 — End: 2023-05-24
  Administered 2023-05-24: 80 mg
  Filled 2023-05-24: qty 2

## 2023-05-24 MED ORDER — LIDOCAINE HCL (PF) 1 % IJ SOLN
INTRAMUSCULAR | Status: AC
  Filled 2023-05-24: qty 60

## 2023-05-24 MED ORDER — SODIUM CHLORIDE 0.9 % IV SOLN: INTRAVENOUS | Status: DC

## 2023-05-24 MED ORDER — HEPARIN (PORCINE) IN NACL 1000-0.9 UT/500ML-% IV SOLN
INTRAVENOUS | Status: DC | PRN
  Administered 2023-05-24: 500 mL

## 2023-05-24 MED ORDER — SODIUM CHLORIDE 0.9 % IV SOLN: 80.0000 mg | INTRAVENOUS | Status: AC

## 2023-05-24 MED ORDER — CEFAZOLIN SODIUM-DEXTROSE 2-4 GM/100ML-% IV SOLN: 2.0000 g | INTRAVENOUS | Status: AC

## 2023-05-24 MED ORDER — ACETAMINOPHEN 325 MG PO TABS: 325.0000 mg | ORAL_TABLET | ORAL | Status: DC | PRN

## 2023-05-24 MED ORDER — CEFAZOLIN SODIUM-DEXTROSE 2-4 GM/100ML-% IV SOLN
INTRAVENOUS | Status: AC
  Administered 2023-05-24: 2 g via INTRAVENOUS
  Filled 2023-05-24: qty 100

## 2023-05-24 MED ORDER — CHLORHEXIDINE GLUCONATE 4 % EX SOLN: 4.0000 | Freq: Once | CUTANEOUS | Status: DC

## 2023-05-24 MED ORDER — LIDOCAINE HCL (PF) 1 % IJ SOLN
INTRAMUSCULAR | Status: DC | PRN
  Administered 2023-05-24: 50 mL

## 2023-05-24 SURGICAL SUPPLY — 7 items
CABLE SURGICAL S-101-97-12 (CABLE) ×1 IMPLANT
MAT PREVALON FULL STRYKER (MISCELLANEOUS) IMPLANT
PACEMAKER ASSURITY DR-RF (Pacemaker) IMPLANT
PAD DEFIB RADIO PHYSIO CONN (PAD) ×1 IMPLANT
POUCH AIGIS-R ANTIBACT PPM (Mesh General) ×1 IMPLANT
POUCH AIGIS-R ANTIBACT PPM MED (Mesh General) IMPLANT
TRAY PACEMAKER INSERTION (PACKS) ×1 IMPLANT

## 2023-05-24 NOTE — Telephone Encounter (Signed)
 I spoke to patient's daughter Hydrographic surveyor) and gave her Lovenox/Coumadin instructions.  She verbalized understanding.

## 2023-05-24 NOTE — Discharge Instructions (Signed)

## 2023-05-24 NOTE — Telephone Encounter (Signed)
 Lp's daughter Hydrographic surveyor) mtcb to discuss Lovenox Bridging.

## 2023-05-24 NOTE — Telephone Encounter (Signed)
 New Message:       Daughter called.She said patient was having a procedure today. She said patient wanted to know when is she supposed to start back taking her shots? Daughter said she did not know the name of the shot.

## 2023-05-24 NOTE — Telephone Encounter (Signed)
 Returned call to patient's daughter she stated already taken care of.

## 2023-05-24 NOTE — H&P (Signed)
  Electrophysiology Office Note:   Date:  05/24/2023  ID:  [name], DOB 1950/12/04, MRN [id number]  Primary Cardiologist: Nanetta Batty, MD Electrophysiologist: Regan Lemming, MD      History of Present Illness:   [name] is a 73 y.o. female with h/o CAD, hypertension, renal artery stenosis seen today for routine electrophysiology followup.   Today, denies symptoms of palpitations, chest pain, shortness of breath, orthopnea, PND, lower extremity edema, claudication, dizziness, presyncope, syncope, bleeding, or neurologic sequela. The patient is tolerating medications without difficulties. Plan gen change today.     EP Information / Studies Reviewed:    EKG is not ordered today. EKG from 07/19/22 reviewed which showed AV paced      PPM Interrogation-  reviewed in detail today,  See PACEART report.  Device History: Abbott Dual Chamber PPM implanted 2015 for CHB  Risk Assessment/Calculations:    CHA2DS2-VASc Score = 4   This indicates a 4.8% annual risk of stroke. The patient's score is based upon: CHF History: 1 HTN History: 1 Diabetes History: 0 Stroke History: 0 Vascular Disease History: 0 Age Score: 1 Gender Score: 1            Physical Exam:   VS:  BP (!) 141/65   Pulse 72   Temp 98.1 F (36.7 C) (Oral)   Resp 16   Ht 5\' 5"  (1.651 m)   Wt 105.2 kg   SpO2 98%   BMI 38.61 kg/m    Wt Readings from Last 3 Encounters:  05/24/23 105.2 kg  05/15/23 107.2 kg  05/14/23 107 kg    GEN: No acute distress.   Neck: No JVD Cardiac: RRR, no murmurs, rubs, or gallops.  Respiratory: decreased BS bases bilaterally. GI: Soft, nontender, non-distended  MS: No edema; No deformity. Neuro:  Nonfocal  Skin: warm and dry, device site well healed Psych: Normal affect    ASSESSMENT AND PLAN:    CHB s/p Abbott PPM  [name] has presented today for surgery, with the diagnosis of heart block.  The various methods of treatment have been discussed with the  patient and family. After consideration of risks, benefits and other options for treatment, the patient has consented to  Procedure(s): Pacemaker generator change as a surgical intervention .  Risks include but not limited to bleeding, infection, pneumothorax, perforation, tamponade, vascular damage, renal failure, MI, stroke, death, and lead dislodgement . The patient's history has been reviewed, patient examined, no change in status, stable for surgery.  I have reviewed the patient's chart and labs.  Questions were answered to the patient's satisfaction.    [name] Struckman Elberta Fortis, MD 05/24/2023 2:55 PM

## 2023-05-29 ENCOUNTER — Telehealth: Payer: Self-pay | Admitting: *Deleted

## 2023-05-29 NOTE — Telephone Encounter (Signed)
 Pt called to report she injured her finger last week and had to go to the ER a couple times since it has kept bleeding. She states she received stiches and now has had to be glued together a couple times. She is on lovenox post her generator change out on 05/24/23. Advised she could check her INR now or do in the morning when she is actually due. She states she will perform INR in the morning. Will follow up with her and she is aware to report to ER with any other immediate issues.   Also, educated the pt again the purpose of lovenox and that INR needs to be at least 2.5 to stop lovenox unles provider gives different instructions to keep her protected from stroke or clot post her procedure; she verbalized understanding.

## 2023-05-30 ENCOUNTER — Ambulatory Visit (INDEPENDENT_AMBULATORY_CARE_PROVIDER_SITE_OTHER): Admitting: Cardiovascular Disease

## 2023-05-30 DIAGNOSIS — Z5181 Encounter for therapeutic drug level monitoring: Secondary | ICD-10-CM

## 2023-05-30 LAB — POCT INR: INR: 5.1 — AB (ref 2.0–3.0)

## 2023-05-30 NOTE — Patient Instructions (Signed)
 Description   SELF TESTER- Called and spoke to pt and instructed her to STOP Lovenox Hold Warfarin today and tomorrow Then continue taking warfarin 1 tablet daily EXCEPT 1.5 tablets on Mondays, Wednesdays and Fridays.  Recheck INR in 1 week. ( Usually 2 weeks) Coumadin Clinic [phone]

## 2023-06-04 ENCOUNTER — Telehealth: Payer: Self-pay

## 2023-06-04 NOTE — Telephone Encounter (Signed)
 I asked the pt for a transmission with her home remote monitor. She is at the doctors office. She will do one when she get home.

## 2023-06-06 ENCOUNTER — Ambulatory Visit: Attending: Internal Medicine

## 2023-06-06 ENCOUNTER — Ambulatory Visit (INDEPENDENT_AMBULATORY_CARE_PROVIDER_SITE_OTHER): Admitting: Cardiology

## 2023-06-06 DIAGNOSIS — I5042 Chronic combined systolic (congestive) and diastolic (congestive) heart failure: Secondary | ICD-10-CM | POA: Diagnosis not present

## 2023-06-06 DIAGNOSIS — I480 Paroxysmal atrial fibrillation: Secondary | ICD-10-CM

## 2023-06-06 DIAGNOSIS — R001 Bradycardia, unspecified: Secondary | ICD-10-CM | POA: Diagnosis not present

## 2023-06-06 DIAGNOSIS — Z5181 Encounter for therapeutic drug level monitoring: Secondary | ICD-10-CM | POA: Diagnosis not present

## 2023-06-06 LAB — CUP PACEART INCLINIC DEVICE CHECK
Date Time Interrogation Session: 20250417163322
Implantable Lead Connection Status: 753985
Implantable Lead Connection Status: 753985
Implantable Lead Implant Date: 20150116
Implantable Lead Implant Date: 20150116
Implantable Lead Location: 753859
Implantable Lead Location: 753860
Implantable Lead Model: 5076
Implantable Pulse Generator Implant Date: 20250404
Pulse Gen Model: 2272
Pulse Gen Serial Number: 8257369

## 2023-06-06 LAB — POCT INR: INR: 2.5 (ref 2.0–3.0)

## 2023-06-06 NOTE — Patient Instructions (Signed)
 Description   SELF TESTER- Called and spoke to pt and instructed her to continue taking warfarin 1 tablet daily EXCEPT 1.5 tablets on Mondays, Wednesdays and Fridays.  Recheck INR in 2 weeks.  Coumadin Clinic [phone]

## 2023-06-06 NOTE — Patient Instructions (Addendum)

## 2023-06-06 NOTE — Progress Notes (Signed)
 Normal dual chamber pacemaker wound check. Presenting rhythm: Afib/VP . Wound well healed. Routine testing performed. Thresholds, sensing, and impedances consistent with previous measurements. Brief a-tach episodes each <20 seconds. AT/AF burden <0.1%. No further arm restrictions at this time after generator change out.  Pt enrolled in remote follow-up. 8.5 years battery longevity.

## 2023-06-10 ENCOUNTER — Telehealth: Payer: Self-pay | Admitting: Cardiology

## 2023-06-10 NOTE — Telephone Encounter (Signed)
 Spoke with patient and she states she had her battery changed recently. The stitches were removed on Friday. On Saturday she noticed that the area has swelling and bruising even her arm. She states its tender but not warm to touch. She would like a call to discuss from [facility]

## 2023-06-10 NOTE — Telephone Encounter (Signed)
 Patient recently had a device change and noticed on Saturday that her chest that feels like she has a baseball inside of her. Patient stated the spot where her device is, the area has risen. Patient stated it is a little tender to touch and the spot is bruised around it. Please advise.

## 2023-06-10 NOTE — Telephone Encounter (Signed)
 Pt evaluated in device clinic d/t concerns of swelling at device site.    Pt is s/p gen change on 05/24/2023 on warfarin.  Pt states her device site appeared normal after her steristrips were removed, but a few days later she felt that she had a new lump.  She was concerned and called device clinic.  Brought Pt in to evaluate wound site.  Wound incision well healed.  There is old bruising noted around incision site that is migrating down Pt's breast.  No new bleeding noted.  Wound site is soft to palpitation.    Had PA evaluate to confirm normal healing.    Advised Pt current healing is WNL.  Discussed s/s to note if she would need to call device clinic.  Pt indicates understanding and is relieved no concerns.

## 2023-06-10 NOTE — Telephone Encounter (Signed)
 Returned call to Pt.  She is shopping right now, but will come to [facility] office when she is done.  Will evaluate wound site.  Pt is on warfarin.

## 2023-06-11 NOTE — Progress Notes (Signed)
 [facility]  Progress Note  PATIENT ID:  [name] is a 73 y.o. (02-25-1950) female  Referring Provider   Leonce Shera POUR, MD   Wound History:   06/03/23 HPI: Patient is a 73 year old female referred by her PCP for evaluation of a laceration of the right hand. She was followed by her PCP for this wound on 05/29/2023. Her PMH includes diastolic dysfunction with chronic heart failure, CKD stage III GFR 30-59, renal artery stenosis, mild intermittent asthma without complication, SLE, morbid obesity, diarrhea due to malabsorption, immunodeficiency due to drugs, complete heart block, HTN, pacemaker with generator change out for EOL 05/24/2023, history of aortic valve replacement, carotid stenosis and HLD. This is not a comprehensive list.  Patient reported to the ED on 05/22/2023 with an injury to her right hand.  She was putting a plant hanging hook in the trash when it dug into her right hand causing a laceration.  It was reported at that visit her tetanus is up-to-date.  She had an x-ray of the right hand which was reported as showing no fracture or radiopaque foreign body.  She had 5 sutures placed.  Patient is on anticoagulant-warfarin and had trouble at home getting the bleeding to stop.  She had 4 subsequent ED visits regarding her laceration due to it bleeding through her bandages.  In addition to receiving stitches Dermabond was placed.  She was having to change her bandages which is every 4 hours.  Patient had concerns because she is unable to sleep at night due to her hand bleeding through the bandages.  At her recent PCP follow-up visit for wound care the wound was packed and patient was advised to reach out to cardiology who is managing her warfarin to see if she can hold the dose to help assist with wound healing.  She was to have the sutures removed however this was not completed. She was also on lovenox . At Coumadin  clinic on 05/30/2023 her INR was 5.1 and she was  instructed to stop  Lovenox , hold warfarin 04/10 and 04/11, then take 1 tablet daily except M,W, F take 1.5 tablets. Her INR will be rechecked this Thursday. She has not changed the bandage since it was placed on 05/29/2023. She has been given a right wrist splint to wear and an arm sling, but has been unable to use as she mainly uses this arm since her pacemaker generator change out-unable to lift >=10 lbs with her left arm since. She denies wound pain.   06/11/23 Patient presents today with a laceration of the right hand. Current dressings consist of Xeroform gauze, kerlix secured with hypafix tape. Patient's wound has almost healed. It was cleansed today and cauterized with silver  nitrate stick for bleeding. Polymem dot secured with hypafix tape applied. The patient should remove in 2-3 days and then apply topical Vaseline. She will see us  on a PRN basis.    History Reviewed   PMH: As per HPI and as reviewed per chart  Surgical History: As reviewed per chart  Tobacco: Never smoker  Relevant Meds: Augmentin, ceftriaxone, cephalexin, azithromycin, ciprofloxacin  Relevant Allergies: None  Diagnostic Studies:   Lab Results  Component Value Date   Hemoglobin A1C 5.5 09/11/2012   Lab Results  Component Value Date   WBC 7.6 03/07/2023   Hemoglobin 11.0 (L) 03/07/2023   Hematocrit 34.9 03/07/2023   MCV 93 03/07/2023   Platelet Count 209 03/07/2023     Chemistry  Component Value Date/Time   NA 144 05/21/2023 1422   NA 144 10/25/2015 0906   K 4.1 05/21/2023 1422   K 4.2 10/25/2015 0906   CL 108 (H) 05/21/2023 1422   CL 102 10/25/2015 0906   CO2 17 (L) 05/21/2023 1422   CO2 30.0 (A) 10/25/2015 0906   BUN 33 (H) 05/21/2023 1422   BUN 37 (A) 10/25/2015 0906   BUN 30 (H) 07/30/2014 1233   CREATININE 1.33 (H) 05/21/2023 1422   GLUCOSE 93 05/21/2023 1422   GLUCOSE 85 10/25/2015 0906      Component Value Date/Time   CALCIUM  10.1 05/21/2023 1422   CALCIUM  10.2 10/25/2015  0906   ALKPHOS 116 03/07/2023 1327   ALKPHOS 103 10/25/2015 0906   AST 20 03/07/2023 1327   AST 32 10/25/2015 0906   ALT 14 03/07/2023 1327   ALT 38 10/25/2015 0906   BILITOT 0.7 03/07/2023 1327   BILITOT 0.6 10/25/2015 0906      ECG 12 lead Result Date: 08/08/2022 Diagnosis Class Abnormal Acquisition Device D3K Systolic BP 180 Diastolic BP 79 Ventricular Rate 73 Atrial Rate 73 P-R Interval 216 QRS Duration 210 Q-T Interval 484 QTC Calculation(Bazett) 533 Calculated R Axis -76 Calculated T Axis 98 Diagnosis Sinus rhythm with 1st degree AV block with premature atrial complexes Left axis deviation Left bundle branch block Abnormal ECG When compared with ECG of 30-Jul-2014 12:19, premature atrial complexes are now present Left bundle branch block has replaced Nonspecific intraventricular block Criteria for Lateral infarct are no longer present Criteria for Inferior infarct are no longer present Floretta Madura ([id number]) on 08/08/2022 11:19:19 PM certifies that he/she has reviewed the ECG tracing and confirms the  independent interpretation is correct.  Results for orders placed in visit on 09/05/20  XR Chest Pa And Lateral  Narrative INDICATION: Short of breath  COMPARISON: 06/05/2016  TECHNIQUE: Chest 2 view  FINDINGS: Cardiomegaly. Pacemaker. Status post aortic valve replacement. The lungs and pleural spaces are clear. No focal infiltrate or definite CHF. Osteopenia and spondylosis. Surgical clips at the left thoracic inlet and right upper quadrant surgical clips suggesting cholecystectomy.  Impression IMPRESSION: No focal infiltrate or definite CHF.  Electronically Signed by: Sheppard Charm on 09/05/2020 12:10 PM  No results found for this or any previous visit.  No results found for this or any previous visit.  No results found for this or any previous visit.   Subjective   General: Negative for fevers, chills, night sweats. Skin: Positive for wound.   Cardiovascular: Positive for  leg swelling.   Negative for chest pain.  Respiratory: Negative for shortness of breath.  GI: As per HPI.  Musculoskeletal: Negative for right hand pain.   Neurological: Negative for right hand paresthesias and weakness.   Objective    Vitals:   06/11/23 1308  BP: 110/70 Comment: manual  Patient Position: Sitting  Pulse: 64  Temp: 97 F (36.1 C)  TempSrc: Temporal  PainSc: 0-No pain    Constitutional:  Obese, well nourished, no distress.   Heart:  Regular rate. Crisp prosthetic hear sound consistent with her mechanical aortic valve.  Lungs:  Effort normal and breath sounds normal.  No wheezes or rales.    Musculoskeletal:  Able to flex and extend her right wrist, make a fist, adduct and abduct her fingers without difficulty.  Vascular:  Radial and ulnar 2+ bilaterally. Capillary refill < 3 seconds on the left and 3 seconds on the right. All digits pink, warm and well perfused of  the left hand. Mildly cool fingertips of the right hand and mild bluish discoloration which she states prior bruising following recent hand injury, states this is better Neurologic: Mental Status: Alert and oriented. Language and speech appear grossly intact. Memory, concentration and fund of knowledge are grossly intact.  Psychiatric: No apparent distress, not anxious Wound Evaluation   Wound 06/04/23 Hand Posterior;Right (Active)  Wound Image   06/11/23 1300  Dressing Status Removed 06/11/23 1300  Wound Length (cm) 1.5 cm 06/11/23 1300  Wound Width (cm) 0.7 cm 06/11/23 1300  Wound Depth (cm) 0.1 cm 06/11/23 1300  Wound Surface Area (cm^2) 1.05 cm^2 06/11/23 1300  Wound Volume (cm^3) 0.105 cm^3 06/11/23 1300  Peri-wound Assessment Pink 06/11/23 1300  Drainage Amount Small 06/11/23 1300  Drainage Description Serosanguineous 06/11/23 1300  Treatments Cleansed with saline 06/11/23 1300   Post debridement:  Full-thickness wound of the right dorsal hand extending to subcutaneous fatty layer.  There is  no tunneling, undermining or probe to bone.  Wound debrided with saline moistened gauze. Bleeding controlled using a single silver  nitrate stick.  Postcauterization    Hand hygiene performed prior to procedure: Before the procedure was started a preoperative timeout was performed which included checking the patient's name, date of birth, procedure to be performed, the site and all pertinent medical information including allergies and special medications.  Procedures Visit Diagnoses/Plan  1. Laceration of right hand without foreign body, initial encounter (Primary) -     Care order/instruction -     Wound care: 2. Long term current use of anticoagulant therapy -     Care order/instruction -     Wound care: 3. History of aortic valve replacement -     Care order/instruction -     Wound care: Other orders -     Debride wound   Orders Placed This Encounter  Procedures  . Debride wound    This order was created via procedure documentation  . Care order/instruction    Remove existing dressings and compression wraps as needed Use adhesive remover on skin as needed for removal of adherent dressings Wash peri-wound with clinic approved gentle cleanser and water as needed Irrigate wound with 0.9% normal saline solution as needed Wipe and clean with gauze to remove any exudate as needed Apply topical lidocaine  gel as needed for wound pain (1 mL per wound of 2% lidocaine  hydrochloride jelly usp 100mg , 20 mg/mL)  . Wound care:    Wound care order:  Location:  Rt posterior hand   Apply to intact skin:  N/A    Apply to wound(s):  Polymem dot, hypafix tape    Apply compression from the base of the toes to approximately 1-2 cm below the knee:                                                     N/A                                      Frequency of dressing change:      pt to remove in 2-3 days  Dispense surgical shoe:   As needed, if safe for ambulation   Complete nurse visit:   As needed, for compression wrap and dressing change  Medical dressing supplies duration of need:  30 days  Nursing note:  Last ABI/PVR completed:  N/a  Does patient have home health:  no    Follow-Up:  1 week or immediately if any new wounds/ulcers, wound deterioration, issues or concerns. Wound care instructions as above. ED precautions discussed. Patient provided with printed material with wound care instructions and ED precautions to review and follow through with.   45 minutes were spent today in direct patient care including: Preparing to see the patient such as reviewing the patient record Obtaining and/or reviewing separately obtained history Performing medically appropriate history and physical examination Counseling and educating the patient, family and/or caregiver Documenting clinical information in electronic health record Provider review of patient chart / records Face to face history, evaluation, physical examination and medical decision making Ordered prescription medications, tests or procedures  All relevant records were reviewed today.  Questions were encouraged and answered.  Risks and benefits of treatment as well as alternatives were discussed and patient expressed understanding. Advised to keep all scheduled visits and immediately reschedule missed visits.  Failure to follow recommendations or keep scheduled visits may lead to poor outcomes, infection, deterioration, hospitalization, loss of limb, major disability and even death.  Immediately contact wound clinic, pcp or immediately proceed to ER if deterioration or concerns such as worsening wound pain, odor, edema, redness / red streaks, fevers, sliding or painful wraps, numbness / pain / color changes of extremity/foot, weakness, chest pain, shortness of breath or any other concerns.  Patient and/or caretaker verbalized understanding of recommendations and agreement with plan of care.    This note was dictated  with Conservation officer, historic buildings. Similar sounding words can be incorrectly transcribed due to technical difficulties with this technology and may not be corrected upon review.

## 2023-06-14 NOTE — Telephone Encounter (Signed)
 Instruction letter has been discarded since patient hasn't picked it from our office.

## 2023-06-20 ENCOUNTER — Ambulatory Visit (INDEPENDENT_AMBULATORY_CARE_PROVIDER_SITE_OTHER): Payer: Self-pay

## 2023-06-20 DIAGNOSIS — Z5181 Encounter for therapeutic drug level monitoring: Secondary | ICD-10-CM

## 2023-06-20 LAB — POCT INR: INR: 3.7 — AB (ref 2.0–3.0)

## 2023-06-20 NOTE — Patient Instructions (Signed)
 Description   SELF TESTER- Called and spoke to pt and instructed her to only take 1/2 tablet today and then continue taking warfarin 1 tablet daily EXCEPT 1.5 tablets on Mondays, Wednesdays and Fridays.  Recheck INR in 2 weeks.  Coumadin  Clinic [phone]

## 2023-07-04 ENCOUNTER — Ambulatory Visit (INDEPENDENT_AMBULATORY_CARE_PROVIDER_SITE_OTHER): Admitting: Cardiovascular Disease

## 2023-07-04 DIAGNOSIS — Z952 Presence of prosthetic heart valve: Secondary | ICD-10-CM

## 2023-07-04 DIAGNOSIS — Z5181 Encounter for therapeutic drug level monitoring: Secondary | ICD-10-CM

## 2023-07-04 LAB — POCT INR: INR: 3.8 — AB (ref 2.0–3.0)

## 2023-07-04 NOTE — Patient Instructions (Signed)
 Description   SELF TESTER- Called and spoke to pt and instructed her to HOLD warfarin today Then START taking warfarin 1 tablet daily EXCEPT 1.5 tablets on Mondays and Fridays.  Recheck INR in 2 weeks.  Coumadin  Clinic [phone]

## 2023-07-11 ENCOUNTER — Other Ambulatory Visit (HOSPITAL_COMMUNITY): Payer: Self-pay | Admitting: Cardiovascular Disease

## 2023-07-18 ENCOUNTER — Ambulatory Visit (INDEPENDENT_AMBULATORY_CARE_PROVIDER_SITE_OTHER)

## 2023-07-18 DIAGNOSIS — Z5181 Encounter for therapeutic drug level monitoring: Secondary | ICD-10-CM

## 2023-07-18 LAB — POCT INR: INR: 3.1 — AB (ref 2.0–3.0)

## 2023-07-18 NOTE — Patient Instructions (Signed)
 Description   SELF TESTER- Called and spoke to pt and instructed her to continue taking warfarin 1 tablet daily EXCEPT 1.5 tablets on Mondays and Fridays.  Recheck INR in 2 weeks.  Coumadin  Clinic [phone]

## 2023-07-31 ENCOUNTER — Other Ambulatory Visit: Payer: Self-pay | Admitting: Cardiovascular Disease

## 2023-07-31 DIAGNOSIS — I4729 Other ventricular tachycardia: Secondary | ICD-10-CM

## 2023-08-01 ENCOUNTER — Ambulatory Visit (INDEPENDENT_AMBULATORY_CARE_PROVIDER_SITE_OTHER): Payer: Self-pay | Admitting: Cardiology

## 2023-08-01 DIAGNOSIS — I480 Paroxysmal atrial fibrillation: Secondary | ICD-10-CM

## 2023-08-01 DIAGNOSIS — Z5181 Encounter for therapeutic drug level monitoring: Secondary | ICD-10-CM | POA: Diagnosis not present

## 2023-08-01 LAB — POCT INR: INR: 4.2 — AB (ref 2.0–3.0)

## 2023-08-01 NOTE — Patient Instructions (Signed)
 Description   SELF TESTER- Called and spoke to pt and instructed her to Hold warfarin today and then continue taking warfarin 1 tablet daily EXCEPT 1.5 tablets on Mondays and Fridays.  Recheck INR in 2 weeks.  Coumadin  Clinic [phone]

## 2023-08-15 ENCOUNTER — Ambulatory Visit (INDEPENDENT_AMBULATORY_CARE_PROVIDER_SITE_OTHER): Payer: Self-pay

## 2023-08-15 DIAGNOSIS — Z5181 Encounter for therapeutic drug level monitoring: Secondary | ICD-10-CM

## 2023-08-15 LAB — POCT INR: INR: 2.7 (ref 2.0–3.0)

## 2023-08-15 NOTE — Progress Notes (Signed)
Please see anticoagulation encounter.

## 2023-08-15 NOTE — Patient Instructions (Signed)
 Description   SELF TESTER- Called and spoke to pt and instructed her to continue taking warfarin 1 tablet daily EXCEPT 1.5 tablets on Mondays and Fridays.  Recheck INR in 2 weeks.  Coumadin  Clinic [phone]

## 2023-08-25 NOTE — Progress Notes (Deleted)
 Cardiology Office Note:  .   Date:  08/25/2023  ID:  [name], DOB December 25, 1950, MRN [id number] PCP: Leonce Sink, MD  [facility] Providers Cardiologist:  Dorn Lesches, MD Electrophysiologist:  Soyla Gladis Norton, MD {  History of Present Illness: .   [name] is a 73 y.o. female w/PMHx of  Lupus, HTN, renal artery stenosis (remotely 60% R renal artery stenosis; non-functioning L kidney), HLD, CKD (III) VHD (mechanical AVR 2006) PVD (left CEA 2000) CHB w/PPM AFib  She saw the AFib clinic team 05/14/23, doing well, low AF burden, pending gen change   Saw Dr. Lesches 05/15/23, doing well, planned for echo ~ Dec  PPM gen change on 05/24/23  06/06/23 : wound check reported to be presenting AF/VP (burden <1%)  4/21: pt reported concerns of lump at site, brought in, site felt to be stable, healing normally, (seen by myself with device RN)  Today's visit is scheduled as her 90 day post procedure visit ROS:   *** site *** chronic outputs *** tikosyn  EKG, labs, meds *** burden  Device information Abbott dual chamber PPM implanted 03/06/2013, gen change 05/24/23  Arrhythmia/AAD hx Tikosyn   started Feb 2020  Studies Reviewed: [name]    EKG done today and reviewed by myself:  ***  DEVICE interrogation done today and reviewed by myself *** Battery and lead measurements are good ***   1. Left ventricular ejection fraction, by estimation, is 50 to 55%. The  left ventricle has low normal function. The left ventricle has no regional  wall motion abnormalities. Septal-latearl dyssynchrony consistent with RV  pacing. The left ventricular  internal cavity size was mildly dilated. Left ventricular diastolic  parameters are consistent with Grade II diastolic dysfunction  (pseudonormalization).   2. D-shaped interventricular septum suggestive of RV pressure/volume  overload. Right ventricular systolic function is mildly reduced. The right  ventricular size is moderately  enlarged. There is mildly elevated  pulmonary artery systolic pressure. The  estimated right ventricular systolic pressure is 39.0 mmHg.   3. Left atrial size was moderately dilated.   4. Right atrial size was severely dilated.   5. The mitral valve is abnormal. Moderate mitral valve regurgitation. No  evidence of mitral stenosis.   6. The tricuspid valve is abnormal. Tricuspid valve regurgitation is  severe.   7. There is a mechanical aortic valve prosthesis present. Trivial  perivalvular leakage. Mean gradient 14 mmHg, DI 0.65.   8. Aortic dilatation noted. There is mild dilatation of the ascending  aorta, measuring 38 mm.   9. The inferior vena cava is dilated in size with <50% respiratory  variability, suggesting right atrial pressure of 15 mmHg.    Risk Assessment/Calculations:    Physical Exam:   VS:  There were no vitals taken for this visit.   Wt Readings from Last 3 Encounters:  05/24/23 232 lb (105.2 kg)  05/15/23 236 lb 6.4 oz (107.2 kg)  05/14/23 235 lb 12.8 oz (107 kg)    GEN: Well nourished, well developed in no acute distress NECK: No JVD; No carotid bruits CARDIAC: ***RRR, no murmurs, rubs, gallops RESPIRATORY:  *** CTA b/l without rales, wheezing or rhonchi  ABDOMEN: Soft, non-tender, non-distended EXTREMITIES: *** No edema; No deformity   PPM site: *** is stable, no thinning, fluctuation, tethering  ASSESSMENT AND PLAN: .    PPM *** intact function *** no programming changes made  paroxysmal AFib CHA2DS2Vasc is 4, on warfarin Chronic Tikosyn  ***  AVR C/w Dr.  Berry/team   Secondary hypercoagulable state      {Are you ordering a CV Procedure (e.g. stress test, cath, DCCV, TEE, etc)?   Press F2        :[id number]}     Dispo: ***  Signed, Charlies Macario Arthur, PA-C

## 2023-08-26 ENCOUNTER — Ambulatory Visit

## 2023-08-26 DIAGNOSIS — I442 Atrioventricular block, complete: Secondary | ICD-10-CM

## 2023-08-27 ENCOUNTER — Ambulatory Visit: Admitting: Physician Assistant

## 2023-08-27 LAB — CUP PACEART REMOTE DEVICE CHECK
Battery Remaining Longevity: 98 mo
Battery Remaining Percentage: 95.5 %
Battery Voltage: 3.01 V
Brady Statistic AP VP Percent: 76 %
Brady Statistic AP VS Percent: 1 %
Brady Statistic AS VP Percent: 24 %
Brady Statistic AS VS Percent: 1 %
Brady Statistic RA Percent Paced: 74 %
Brady Statistic RV Percent Paced: 99 %
Date Time Interrogation Session: 20250707020031
Implantable Lead Connection Status: 753985
Implantable Lead Connection Status: 753985
Implantable Lead Implant Date: 20150116
Implantable Lead Implant Date: 20150116
Implantable Lead Location: 753859
Implantable Lead Location: 753860
Implantable Lead Model: 5076
Implantable Pulse Generator Implant Date: 20250404
Lead Channel Impedance Value: 360 Ohm
Lead Channel Impedance Value: 540 Ohm
Lead Channel Pacing Threshold Amplitude: 0.75 V
Lead Channel Pacing Threshold Amplitude: 0.75 V
Lead Channel Pacing Threshold Pulse Width: 0.5 ms
Lead Channel Pacing Threshold Pulse Width: 0.5 ms
Lead Channel Sensing Intrinsic Amplitude: 10.6 mV
Lead Channel Sensing Intrinsic Amplitude: 4 mV
Lead Channel Setting Pacing Amplitude: 2 V
Lead Channel Setting Pacing Amplitude: 2.5 V
Lead Channel Setting Pacing Pulse Width: 0.5 ms
Lead Channel Setting Sensing Sensitivity: 4 mV
Pulse Gen Model: 2272
Pulse Gen Serial Number: 8257369

## 2023-08-28 ENCOUNTER — Other Ambulatory Visit: Payer: Self-pay | Admitting: Cardiovascular Disease

## 2023-08-28 DIAGNOSIS — I480 Paroxysmal atrial fibrillation: Secondary | ICD-10-CM

## 2023-08-29 ENCOUNTER — Ambulatory Visit (INDEPENDENT_AMBULATORY_CARE_PROVIDER_SITE_OTHER): Payer: Self-pay

## 2023-08-29 DIAGNOSIS — Z5181 Encounter for therapeutic drug level monitoring: Secondary | ICD-10-CM | POA: Diagnosis not present

## 2023-08-29 LAB — POCT INR: INR: 2.9 (ref 2.0–3.0)

## 2023-08-29 NOTE — Patient Instructions (Signed)
 Description   SELF TESTER- Called and spoke to pt and instructed her to continue taking warfarin 1 tablet daily EXCEPT 1.5 tablets on Mondays and Fridays.  Recheck INR in 2 weeks.  Coumadin  Clinic [phone]

## 2023-08-29 NOTE — Progress Notes (Signed)
Please see anticoagulation encounter.

## 2023-08-29 NOTE — Telephone Encounter (Signed)
 Prescription refill request received for warfarin Lov:  05/15/23 [name])  Next INR check: 08/29/23 Warfarin tablet strength: 5mg  Appropriate dose. Refill sent.

## 2023-09-12 ENCOUNTER — Ambulatory Visit (INDEPENDENT_AMBULATORY_CARE_PROVIDER_SITE_OTHER)

## 2023-09-12 DIAGNOSIS — Z5181 Encounter for therapeutic drug level monitoring: Secondary | ICD-10-CM | POA: Diagnosis not present

## 2023-09-12 LAB — POCT INR: INR: 2.9 (ref 2.0–3.0)

## 2023-09-12 NOTE — Progress Notes (Signed)
 INR 2.9  Please see anticoagulation encounter

## 2023-09-12 NOTE — Patient Instructions (Signed)
 Description   SELF TESTER- Called and spoke to pt and instructed her to continue taking warfarin 1 tablet daily EXCEPT 1.5 tablets on Mondays and Fridays.  Recheck INR in 2 weeks.  Coumadin  Clinic [phone]

## 2023-09-21 ENCOUNTER — Ambulatory Visit: Payer: Self-pay | Admitting: Cardiology

## 2023-09-23 NOTE — Progress Notes (Unsigned)
 Cardiology Office Note:  .   Date:  09/23/2023  ID:  [name], DOB [birth year], MRN [id number] PCP: [name] Sink, MD  [facility] Providers Cardiologist:  [name] Lesches, MD Electrophysiologist:  [name] Gladis Norton, MD {  History of Present Illness: .   [name] is a 73 y.o. female w/PMHx of  Lupus, HTN, renal artery stenosis (remotely 60% R renal artery stenosis; non-functioning L kidney), HLD, CKD (III) VHD (mechanical AVR 2006) PVD (left CEA 2000) CHB w/PPM AFib  She saw the AFib clinic team 05/14/23, doing well, low AF burden, pending gen change   Saw Dr. Lesches 05/15/23, doing well, planned for echo ~ Dec  PPM gen change on 05/24/23  06/06/23 : wound check reported to be presenting AF/VP (burden <1%)  4/21: pt reported concerns of lump at site, brought in, site felt to be stable, healing normally, (seen by myself with device RN)  Today's visit is scheduled as her 90 day post procedure visit ROS:   She is doing well [place], [place] No pacer site concerns, healed up well No CP, palpitations or cardiac awareness No rest SOB, no difficulties with ADLs, gets winded with longer walkes/heavier activities, reports at her baseline for several years No near syncope or syncope   Device information Abbott dual chamber PPM implanted 03/06/2013, gen change 05/24/23  Arrhythmia/AAD hx Tikosyn   started Feb 2020  Studies Reviewed: [name]    EKG done today and reviewed by myself:  SR/V paced, PAC (confirmed by device check), QTc given broad paced QRS is OK (~480ms)  DEVICE interrogation done today and reviewed by myself Battery and lead measurements are good AF burden <1% EGMs are c/w brief PATs  02/01/23: TTE 1. Left ventricular ejection fraction, by estimation, is 50 to 55%. The  left ventricle has low normal function. The left ventricle has no regional  wall motion abnormalities. Septal-latearl dyssynchrony consistent  with RV  pacing. The left ventricular  internal cavity size was mildly dilated. Left ventricular diastolic  parameters are consistent with Grade II diastolic dysfunction  (pseudonormalization).   2. D-shaped interventricular septum suggestive of RV pressure/volume  overload. Right ventricular systolic function is mildly reduced. The right  ventricular size is moderately enlarged. There is mildly elevated  pulmonary artery systolic pressure. The  estimated right ventricular systolic pressure is 39.0 mmHg.   3. Left atrial size was moderately dilated.   4. Right atrial size was severely dilated.   5. The mitral valve is abnormal. Moderate mitral valve regurgitation. No  evidence of mitral stenosis.   6. The tricuspid valve is abnormal. Tricuspid valve regurgitation is  severe.   7. There is a mechanical aortic valve prosthesis present. Trivial  perivalvular leakage. Mean gradient 14 mmHg, DI 0.65.   8. Aortic dilatation noted. There is mild dilatation of the ascending  aorta, measuring 38 mm.   9. The inferior vena cava is dilated in size with <50% respiratory  variability, suggesting right atrial pressure of 15 mmHg.    Risk Assessment/Calculations:    Physical Exam:   VS:  There were no vitals taken for this visit.   Wt Readings from Last 3 Encounters:  05/24/23 232 lb (105.2 kg)  05/15/23 236 lb 6.4 oz (107.2 kg)  05/14/23 235 lb 12.8 oz (107 kg)    GEN: Well nourished, well developed in no acute distress NECK: No JVD; No carotid bruits CARDIAC: RRR, no murmurs, rubs, gallops RESPIRATORY:  CTA b/l without rales, wheezing or rhonchi  ABDOMEN: Soft, non-tender, non-distended EXTREMITIES: 1+ edema b/l LE; No deformity   PPM site: is stable, no thinning, fluctuation, tethering  ASSESSMENT AND PLAN: .    PPM intact function no programming changes made  paroxysmal AFib CHA2DS2Vasc is 4, on warfarin Chronic Tikosyn , QTc is OK <1% burden, EGMs are brief PATs Med list  looks OK Labs today  AVR C/w Dr. Brenna   Secondary hypercoagulable state  5. DOE with increased exertion/edema She reports both well known for her/chronic and at her baseline C/w Dr. Brenna  Dispo: back in 67mo, sooner if needed  Signed, Savyon Loken Macario Arthur, PA-C

## 2023-09-25 ENCOUNTER — Encounter: Payer: Self-pay | Admitting: Physician Assistant

## 2023-09-25 ENCOUNTER — Ambulatory Visit: Payer: Self-pay | Admitting: Cardiology

## 2023-09-25 ENCOUNTER — Ambulatory Visit: Attending: Cardiology | Admitting: Physician Assistant

## 2023-09-25 VITALS — BP 114/68 | HR 65 | Ht 65.0 in | Wt 243.8 lb

## 2023-09-25 DIAGNOSIS — I480 Paroxysmal atrial fibrillation: Secondary | ICD-10-CM | POA: Diagnosis not present

## 2023-09-25 DIAGNOSIS — Z5181 Encounter for therapeutic drug level monitoring: Secondary | ICD-10-CM

## 2023-09-25 DIAGNOSIS — Z79899 Other long term (current) drug therapy: Secondary | ICD-10-CM

## 2023-09-25 DIAGNOSIS — Z950 Presence of cardiac pacemaker: Secondary | ICD-10-CM | POA: Diagnosis not present

## 2023-09-25 DIAGNOSIS — D6869 Other thrombophilia: Secondary | ICD-10-CM | POA: Diagnosis not present

## 2023-09-25 LAB — CUP PACEART INCLINIC DEVICE CHECK
Battery Remaining Longevity: 94 mo
Battery Voltage: 3.01 V
Brady Statistic RA Percent Paced: 76 %
Brady Statistic RV Percent Paced: 99.75 %
Date Time Interrogation Session: 20250806130912
Implantable Lead Connection Status: 753985
Implantable Lead Connection Status: 753985
Implantable Lead Implant Date: 20150116
Implantable Lead Implant Date: 20150116
Implantable Lead Location: 753859
Implantable Lead Location: 753860
Implantable Lead Model: 5076
Implantable Pulse Generator Implant Date: 20250404
Lead Channel Impedance Value: 362.5 Ohm
Lead Channel Impedance Value: 562.5 Ohm
Lead Channel Pacing Threshold Amplitude: 0.5 V
Lead Channel Pacing Threshold Amplitude: 0.5 V
Lead Channel Pacing Threshold Amplitude: 0.5 V
Lead Channel Pacing Threshold Amplitude: 0.5 V
Lead Channel Pacing Threshold Pulse Width: 0.5 ms
Lead Channel Pacing Threshold Pulse Width: 0.5 ms
Lead Channel Pacing Threshold Pulse Width: 0.5 ms
Lead Channel Pacing Threshold Pulse Width: 0.5 ms
Lead Channel Sensing Intrinsic Amplitude: 10.4 mV
Lead Channel Sensing Intrinsic Amplitude: 3.9 mV
Lead Channel Setting Pacing Amplitude: 2 V
Lead Channel Setting Pacing Amplitude: 2.5 V
Lead Channel Setting Pacing Pulse Width: 0.5 ms
Lead Channel Setting Sensing Sensitivity: 4 mV
Pulse Gen Model: 2272
Pulse Gen Serial Number: 8257369

## 2023-09-25 NOTE — Patient Instructions (Signed)
 Medication Instructions:   Your physician recommends that you continue on your current medications as directed. Please refer to the Current Medication list given to you today.  *If you need a refill on your cardiac medications before your next appointment, please call your pharmacy*   Lab Work:   PLEASE GO DOWN STAIRS  [facility]  FIRST FLOOR   ( GET OFF ELEVATORS WALK TOWARDS WAITING AREA LAB LOCATED BY PHARMACY): BMET  MAG AND CBC TODAY    If you have labs (blood work) drawn today and your tests are completely normal, you will receive your results only by: MyChart Message (if you have MyChart) OR A paper copy in the mail If you have any lab test that is abnormal or we need to change your treatment, we will call you to review the results.  Testing/Procedures: NONE ORDERED  TODAY   Follow-Up: At [facility], you and your health needs are our priority.  As part of our continuing mission to provide you with exceptional heart care, our providers are all part of one team.  This team includes your primary Cardiologist (physician) and Advanced Practice Providers or APPs (Physician Assistants and Nurse Practitioners) who all work together to provide you with the care you need, when you need it.  Your next appointment:  DR BERRY/APP IN 1 -2 MONTHS                                                                                             AND   4 month(s)  Provider: You may see Will Gladis Norton, MD  or o Charlies Arthur, PA-C ( CONTACT  CASSIE HALL/ ANGELINE HAMMER FOR EP SCHEDULING ISSUES )    We recommend signing up for the patient portal called MyChart.  Sign up information is provided on this After Visit Summary.  MyChart is used to connect with patients for Virtual Visits (Telemedicine).  Patients are able to view lab/test results, encounter notes, upcoming appointments, etc.  Non-urgent messages can be sent to your provider as well.   To learn more about what you can do with  MyChart, go to [email or web address].   Other Instructions

## 2023-09-26 ENCOUNTER — Ambulatory Visit (INDEPENDENT_AMBULATORY_CARE_PROVIDER_SITE_OTHER)

## 2023-09-26 DIAGNOSIS — Z5181 Encounter for therapeutic drug level monitoring: Secondary | ICD-10-CM

## 2023-09-26 LAB — CBC
Hematocrit: 34.2 % (ref 34.0–46.6)
Hemoglobin: 10.4 g/dL — ABNORMAL LOW (ref 11.1–15.9)
MCH: 29 pg (ref 26.6–33.0)
MCHC: 30.4 g/dL — ABNORMAL LOW (ref 31.5–35.7)
MCV: 95 fL (ref 79–97)
Platelets: 189 x10E3/uL (ref 150–450)
RBC: 3.59 x10E6/uL — ABNORMAL LOW (ref 3.77–5.28)
RDW: 13.7 % (ref 11.7–15.4)
WBC: 6.8 x10E3/uL (ref 3.4–10.8)

## 2023-09-26 LAB — BASIC METABOLIC PANEL WITH GFR
BUN/Creatinine Ratio: 26 (ref 12–28)
BUN: 38 mg/dL — ABNORMAL HIGH (ref 8–27)
CO2: 21 mmol/L (ref 20–29)
Calcium: 9.7 mg/dL (ref 8.7–10.3)
Chloride: 101 mmol/L (ref 96–106)
Creatinine, Ser: 1.49 mg/dL — ABNORMAL HIGH (ref 0.57–1.00)
Glucose: 96 mg/dL (ref 70–99)
Potassium: 4 mmol/L (ref 3.5–5.2)
Sodium: 140 mmol/L (ref 134–144)
eGFR: 38 mL/min/1.73 — ABNORMAL LOW (ref >59)

## 2023-09-26 LAB — POCT INR: INR: 4.3 — AB (ref 2.0–3.0)

## 2023-09-26 NOTE — Progress Notes (Signed)
 INR 4.3; Please see anticoagulation encounter

## 2023-09-26 NOTE — Patient Instructions (Signed)
 Description   SELF TESTER- Called and spoke to pt and instructed her to HOLD today's dose and then continue taking warfarin 1 tablet daily EXCEPT 1.5 tablets on Mondays and Fridays.  Recheck INR in 2 weeks.  Coumadin  Clinic [phone]

## 2023-10-10 ENCOUNTER — Ambulatory Visit (INDEPENDENT_AMBULATORY_CARE_PROVIDER_SITE_OTHER)

## 2023-10-10 DIAGNOSIS — Z5181 Encounter for therapeutic drug level monitoring: Secondary | ICD-10-CM

## 2023-10-10 LAB — POCT INR: INR: 2.1 (ref 2.0–3.0)

## 2023-10-10 NOTE — Patient Instructions (Signed)
 Description   SELF TESTER- Called and spoke to pt and instructed her to take 1.5 tablets today and then continue taking warfarin 1 tablet daily EXCEPT 1.5 tablets on Mondays and Fridays.  Recheck INR in 2 weeks.  Coumadin  Clinic [phone]

## 2023-10-10 NOTE — Progress Notes (Signed)
 INR 2.1; Please see anticoagulation encounter

## 2023-10-24 ENCOUNTER — Ambulatory Visit (INDEPENDENT_AMBULATORY_CARE_PROVIDER_SITE_OTHER): Admitting: Cardiology

## 2023-10-24 DIAGNOSIS — I480 Paroxysmal atrial fibrillation: Secondary | ICD-10-CM

## 2023-10-24 DIAGNOSIS — Z5181 Encounter for therapeutic drug level monitoring: Secondary | ICD-10-CM

## 2023-10-24 LAB — POCT INR: INR: 4.4 — AB (ref 2.0–3.0)

## 2023-10-24 NOTE — Patient Instructions (Signed)
 Description   SELF TESTER- Called and spoke to pt and instructed her to hold warfarin today and then continue taking warfarin 1 tablet daily EXCEPT 1.5 tablets on Mondays and Fridays.  Recheck INR in 2 weeks.  Coumadin  Clinic [phone]

## 2023-10-24 NOTE — Progress Notes (Signed)
 INR  4.4; Please see anticoagulation encounter  Lab Results  Component Value Date   INR 4.4 (A) 10/24/2023   INR 2.1 10/10/2023   INR 4.3 (A) 09/26/2023    Description   SELF TESTER- Called and spoke to pt and instructed her to hold warfarin today and then continue taking warfarin 1 tablet daily EXCEPT 1.5 tablets on Mondays and Fridays.  Recheck INR in 2 weeks.  Coumadin  Clinic [phone]

## 2023-10-28 ENCOUNTER — Other Ambulatory Visit: Payer: Self-pay | Admitting: Cardiovascular Disease

## 2023-10-28 DIAGNOSIS — I480 Paroxysmal atrial fibrillation: Secondary | ICD-10-CM

## 2023-10-30 ENCOUNTER — Ambulatory Visit: Attending: Cardiovascular Disease | Admitting: Cardiovascular Disease

## 2023-10-30 ENCOUNTER — Encounter: Payer: Self-pay | Admitting: Cardiovascular Disease

## 2023-10-30 ENCOUNTER — Other Ambulatory Visit: Payer: Self-pay | Admitting: Cardiovascular Disease

## 2023-10-30 VITALS — BP 118/78 | HR 71 | Ht 65.0 in | Wt 241.4 lb

## 2023-10-30 DIAGNOSIS — I442 Atrioventricular block, complete: Secondary | ICD-10-CM | POA: Diagnosis not present

## 2023-10-30 DIAGNOSIS — I10 Essential (primary) hypertension: Secondary | ICD-10-CM | POA: Diagnosis not present

## 2023-10-30 DIAGNOSIS — Z952 Presence of prosthetic heart valve: Secondary | ICD-10-CM | POA: Diagnosis not present

## 2023-10-30 DIAGNOSIS — E782 Mixed hyperlipidemia: Secondary | ICD-10-CM | POA: Diagnosis not present

## 2023-10-30 DIAGNOSIS — I5189 Other ill-defined heart diseases: Secondary | ICD-10-CM

## 2023-10-30 DIAGNOSIS — I4819 Other persistent atrial fibrillation: Secondary | ICD-10-CM

## 2023-10-30 NOTE — Progress Notes (Signed)
 10/30/2023 [name]   11-Feb-1951  [id number]  Primary Physician Leonce Sink, MD Primary Cardiologist: Dorn JINNY Lesches MD FACP, [place], [place], [place]  HPI:  [name] is a 73 y.o.  moderately overweight, almost divorced Caucasian female mother of 3, grandmother to 3 grandchildren who I last saw in the office 05/15/2023.  She had a [facility] AVR performed by Dr. Katherene Jude September of 2006 because of severe aortic insufficiency. She had normal coronary arteries by cath at that time. A look at her renal arteries and demonstrated a 60% right renal artery stenosis and patent accessory left renal arteries. By duplex she does have a non-functioning left kidney. Her other problems include hypertension and hyperlipidemia. She also has a history of lupus followed by Dr. Zaminski. An echo performed a year ago showed a well-functioning aortic prosthesis, and recent renal Dopplers revealed a widely patent right renal artery. She had a small bowel obstruction and underwent surgery at [facility] for perforated small bowel. She had an exploratory laparotomy with ileostomy and a right hemicolectomy 04/12/12. She saw Redell Sow PAC back in our office 06/12/12 and her medicines were adjusted.   She was admitted in early January for symptomatic complete heart block underwent permanent pacemaker insertion by Dr. Lynwood Rakers.[name] She was remitted approximately week later with orthostatic hypotension and was dehydrated. She responded to fluid resuscitation. Since I saw her a year ago she was admitted with heart failure requiring diuresis secondary to diastolic dysfunction and dietary indiscretion. In addition, she recently underwent an cardiac catheterization by Dr. Swaziland because of chest pain 04/19/15  revealing normal coronary arteries.  She was admitted to [facility] of volume overload 12/21/15 and was diuresed     She was complaining of increasing dyspnea.  She was found to be in A. fib and was  admitted for Tikosyn  load on 04/01/2018.  She converted to sinus rhythm and feels clinically improved.   She  continues to do well ostensibly maintaining sinus rhythm on Tikosyn  and warfarin.  She did see Dr. Rakers back who confirmed continued improvement.  She does have diastolic heart failure on diuretics with grade 1 diastolic dysfunction on 2D echo 05/24/2017.  Recent carotid and renal Doppler studies showed an occluded left renal artery with a widely patent right renal artery and an occluded left internal carotid with a widely patent right.   She is followed in the A. fib clinic extensively maintaining sinus rhythm after her Tikosyn  load.  Her most recent 2D echo performed 08/26/2019 revealed an EF of 40% with a well-functioning aortic mechanical prosthesis on Coumadin  anticoagulation.    Since I saw her in the office 6 months ago she did have a generator change by Dr. Camnitz4/4/25.  She is maintaining sinus rhythm on Tikosyn .  Her A-fib burden is less than 1%.  Her most recent 2D echo performed 02/01/2023 revealed normal LV systolic function, mild increase in RV systolic pressures, moderate MR and a well-functioning aortic mechanical prosthesis.  She denies chest pain or shortness of breath.   Current Meds  Medication Sig   acetaminophen  (TYLENOL ) 325 MG tablet Take 650 mg by mouth every 6 (six) hours as needed for moderate pain.    albuterol  (VENTOLIN  HFA) 108 (90 Base) MCG/ACT inhaler Inhale 2 puffs into the lungs every 6 (six) hours as needed for wheezing or shortness of breath.   allopurinol  (ZYLOPRIM ) 100 MG tablet Take 100 mg by mouth daily.    cetirizine (  ZYRTEC) 10 MG tablet Take 10 mg by mouth at bedtime.   chlorhexidine  (PERIDEX ) 0.12 % solution Use as directed 10 mLs in the mouth or throat 2 (two) times daily as needed (before dental appts).    cholestyramine (QUESTRAN) 4 g packet Take 4 g by mouth daily.   clotrimazole-betamethasone (LOTRISONE) cream Apply 1 Application topically  daily as needed (Rash).   dicyclomine (BENTYL) 20 MG tablet Take 20 mg by mouth daily.   dofetilide  (TIKOSYN ) 250 MCG capsule Take 1 capsule by mouth twice daily   ferrous sulfate 325 (65 FE) MG tablet Take 325 mg by mouth 2 (two) times daily with a meal.   fluticasone -salmeterol (WIXELA INHUB) 250-50 MCG/ACT AEPB USE 1 INHALATION BY MOUTH  twice daily   furosemide  (LASIX ) 40 MG tablet Take 1 tablet (40 mg total) by mouth daily. Take 80mg  X3 days then back to 40 mg daily   hydroxychloroquine  (PLAQUENIL ) 200 MG tablet Take 200 mg by mouth daily.   isosorbide  mononitrate (IMDUR ) 30 MG 24 hr tablet TAKE 1 TABLET BY MOUTH DAILY   leflunomide  (ARAVA ) 10 MG tablet Take 10 mg by mouth daily.   levothyroxine (SYNTHROID) 25 MCG tablet Take 25 mcg by mouth daily.   loratadine  (CLARITIN ) 10 MG tablet Take 10 mg by mouth daily.   losartan  (COZAAR ) 25 MG tablet Take 1 tablet by mouth once daily   Magnesium  200 MG TABS Take 1 tablet (200 mg total) by mouth daily.   metoprolol  succinate (TOPROL -XL) 50 MG 24 hr tablet TAKE 1 TABLET BY MOUTH DAILY  WITH OR IMMEDIATELY FOLLOWING A  MEAL   predniSONE  (DELTASONE ) 5 MG tablet Take 5 mg by mouth daily.   Saccharomyces boulardii (PROBIOTIC) 250 MG CAPS Take 250 mg by mouth daily.   simvastatin  (ZOCOR ) 20 MG tablet Take 1 tablet by mouth at  bedtime   warfarin (COUMADIN ) 5 MG tablet TAKE 1 TO 1 AND 1/2 TABLETS BY  MOUTH DAILY AS DIRECTED BY  COUMADIN  CLINIC     Allergies  Allergen Reactions   Spironolactone Other (See Comments)    Hyperkalemia    Social History   Socioeconomic History   Marital status: Divorced    Spouse name: Not on file   Number of children: 3   Years of education: Not on file   Highest education level: Not on file  Occupational History   Occupation: Retired  Tobacco Use   Smoking status: Never   Smokeless tobacco: Never   Tobacco comments:    Never smoked 05/14/23  Vaping Use   Vaping status: Never Used  Substance and Sexual  Activity   Alcohol  use: No   Drug use: No   Sexual activity: Not on file  Other Topics Concern   Not on file  Social History Narrative   Lives alone in [place].   Social Drivers of Corporate investment banker Strain: Low Risk  (03/07/2023)   Received from [facility]   Overall Financial Resource Strain (CARDIA)    Difficulty of Paying Living Expenses: Not hard at all  Food Insecurity: No Food Insecurity (03/07/2023)   Received from [facility]   Hunger Vital Sign    Within the past 12 months, you worried that your food would run out before you got the money to buy more.: Never true    Within the past 12 months, the food you bought just didn't last and you didn't have money to get more.: Never true  Transportation Needs: No Transportation Needs (  03/07/2023)   Received from [facility]   PRAPARE - Transportation    Lack of Transportation (Medical): No    Lack of Transportation (Non-Medical): No  Physical Activity: Insufficiently Active (01/30/2023)   Received from [facility]   Exercise Vital Sign    On average, how many days per week do you engage in moderate to strenuous exercise (like a brisk walk)?: 1 day    On average, how many minutes do you engage in exercise at this level?: 30 min  Stress: No Stress Concern Present (01/30/2023)   Received from [facility] of Occupational Health - Occupational Stress Questionnaire    Feeling of Stress : Only a little  Social Connections: Moderately Integrated (01/30/2023)   Received from [facility]   Social Network    How would you rate your social network (family, work, friends)?: Adequate participation with social networks  Intimate Partner Violence: Not At Risk (05/29/2023)   Received from [facility]   HITS    Over the last 12 months how often did your partner physically hurt you?: Never    Over the last 12 months how often did your partner insult you or talk down to you?: Never    Over the last  12 months how often did your partner threaten you with physical harm?: Never    Over the last 12 months how often did your partner scream or curse at you?: Never     Review of Systems: General: negative for chills, fever, night sweats or weight changes.  Cardiovascular: negative for chest pain, dyspnea on exertion, edema, orthopnea, palpitations, paroxysmal nocturnal dyspnea or shortness of breath Dermatological: negative for rash Respiratory: negative for cough or wheezing Urologic: negative for hematuria Abdominal: negative for nausea, vomiting, diarrhea, bright red blood per rectum, melena, or hematemesis Neurologic: negative for visual changes, syncope, or dizziness All other systems reviewed and are otherwise negative except as noted above.    Blood pressure 118/78, pulse 71, height 5' 5 (1.651 m), weight 241 lb 6.4 oz (109.5 kg), SpO2 96%.  General appearance: alert and no distress Neck: no adenopathy, no carotid bruit, no JVD, supple, symmetrical, trachea midline, and thyroid  not enlarged, symmetric, no tenderness/mass/nodules Lungs: clear to auscultation bilaterally Heart: Crisp mechanical aortic valve sounds. Extremities: extremities normal, atraumatic, no cyanosis or edema Pulses: 2+ and symmetric Skin: Skin color, texture, turgor normal. No rashes or lesions Neurologic: Grossly normal  EKG not performed today      ASSESSMENT AND PLAN:   Complete heart block (HCC) History of complete heart block in the past status post [facility] Jude permanent transvenous pacemaker implantation January 2015.  She recently had a generator change by Dr. Inocencio 05/24/23.  S/P AVR (aortic valve replacement) Status post [facility] AVR by Camellia Fried September 2006 for severe aortic insufficiency.  Her coronary arteries were normal by cath at that time.  Her last cath performed by Dr. Swaziland 04/11/2015 again revealed normal coronary arteries.  Her most recent 2D echo performed 02/01/2023 revealed  normal LV systolic function, mild pulmonary hypertension, mild MR and a well-functioning aortic mechanical prosthesis.  She is on Coumadin  anticoagulation.  Hyperlipidemia History of hyperlipidemia on simvastatin  with lipid profile performed 07/19/2022 revealing total cholesterol 148, LDL 37 and HDL 96.  Essential hypertension History of essential hypertension with blood pressure measured today at 118/78.  She is on losartan  and metoprolol .  Diastolic dysfunction History of diastolic heart failure with echo performed 02/01/2023 revealing grade 2 diastolic dysfunction.  She has had admissions for heart failure for dietary indiscretion.  She is on furosemide .  Persistent atrial fibrillation (HCC) History of PAF maintaining sinus rhythm on Tikosyn .  She is also on Coumadin  for her mechanical aortic valve.     Dorn DOROTHA Lesches MD FACP,FACC,FAHA, [facility] 10/30/2023 2:19 PM

## 2023-10-30 NOTE — Assessment & Plan Note (Signed)
 Status post [facility] Jude AVR by Camellia Fried September 2006 for severe aortic insufficiency.  Her coronary arteries were normal by cath at that time.  Her last cath performed by Dr. Swaziland 04/11/2015 again revealed normal coronary arteries.  Her most recent 2D echo performed 02/01/2023 revealed normal LV systolic function, mild pulmonary hypertension, mild MR and a well-functioning aortic mechanical prosthesis.  She is on Coumadin  anticoagulation.

## 2023-10-30 NOTE — Assessment & Plan Note (Signed)
 History of hyperlipidemia on simvastatin  with lipid profile performed 07/19/2022 revealing total cholesterol 148, LDL 37 and HDL 96.

## 2023-10-30 NOTE — Assessment & Plan Note (Signed)
 History of essential hypertension with blood pressure measured today at 118/78.  She is on losartan  and metoprolol .

## 2023-10-30 NOTE — Assessment & Plan Note (Signed)
 History of PAF maintaining sinus rhythm on Tikosyn .  She is also on Coumadin  for her mechanical aortic valve.

## 2023-10-30 NOTE — Patient Instructions (Signed)
 Medication Instructions:  Your physician recommends that you continue on your current medications as directed. Please refer to the Current Medication list given to you today.  *If you need a refill on your cardiac medications before your next appointment, please call your pharmacy*  Follow-Up: At [facility], you and your health needs are our priority.  As part of our continuing mission to provide you with exceptional heart care, our providers are all part of one team.  This team includes your primary Cardiologist (physician) and Advanced Practice Providers or APPs (Physician Assistants and Nurse Practitioners) who all work together to provide you with the care you need, when you need it.  Your next appointment:   1 year  Provider:   Dorn Lesches, MD

## 2023-10-30 NOTE — Assessment & Plan Note (Signed)
 History of diastolic heart failure with echo performed 02/01/2023 revealing grade 2 diastolic dysfunction.  She has had admissions for heart failure for dietary indiscretion.  She is on furosemide .

## 2023-10-30 NOTE — Assessment & Plan Note (Signed)
 History of complete heart block in the past status post [facility] permanent transvenous pacemaker implantation January 2015.  She recently had a generator change by Dr. Inocencio 05/24/23.

## 2023-11-07 ENCOUNTER — Ambulatory Visit (INDEPENDENT_AMBULATORY_CARE_PROVIDER_SITE_OTHER): Admitting: Cardiology

## 2023-11-07 DIAGNOSIS — Z5181 Encounter for therapeutic drug level monitoring: Secondary | ICD-10-CM | POA: Diagnosis not present

## 2023-11-07 DIAGNOSIS — Z952 Presence of prosthetic heart valve: Secondary | ICD-10-CM | POA: Diagnosis not present

## 2023-11-07 LAB — POCT INR: INR: 4.8 — AB (ref 2.0–3.0)

## 2023-11-07 NOTE — Progress Notes (Signed)
 INR 4.8 Please see anticoagulation encounter SELF TESTER- Called and spoke to pt and instructed her to hold warfarin today and then continue taking warfarin 1 tablet daily EXCEPT 1.5 tablets on Mondays and Fridays.  Recheck INR in 2 weeks.  Coumadin  Clinic [phone]

## 2023-11-12 ENCOUNTER — Ambulatory Visit (HOSPITAL_COMMUNITY)
Admission: RE | Admit: 2023-11-12 | Discharge: 2023-11-12 | Disposition: A | Discharge status: Home / Self Care | Source: Ambulatory Visit | Attending: Physician Assistant | Admitting: Physician Assistant

## 2023-11-12 VITALS — BP 120/70 | HR 63 | Ht 65.0 in | Wt 244.6 lb

## 2023-11-12 DIAGNOSIS — Z79899 Other long term (current) drug therapy: Secondary | ICD-10-CM | POA: Diagnosis not present

## 2023-11-12 DIAGNOSIS — D6869 Other thrombophilia: Secondary | ICD-10-CM

## 2023-11-12 DIAGNOSIS — I4819 Other persistent atrial fibrillation: Secondary | ICD-10-CM | POA: Diagnosis not present

## 2023-11-12 DIAGNOSIS — I4891 Unspecified atrial fibrillation: Secondary | ICD-10-CM | POA: Diagnosis not present

## 2023-11-12 DIAGNOSIS — Z5181 Encounter for therapeutic drug level monitoring: Secondary | ICD-10-CM

## 2023-11-12 NOTE — Progress Notes (Signed)
 Primary Care Physician: Leonce Sink, MD Primary Cardiologist: Dr Court Primary EP: Dr Inocencio  Referring Physician:Dr. Kelsie   [name] is a 73 y.o. female with a h/o PPM, CAD, mechanical AVR on warfarin, persistent afib, CHB s/p PPM, lupus, renal artery stenosis who presents for follow up in the [facility]. She is on warfarin for stroke prevention. Patient is s/p dofetilide  loading 03/2018.  Patient returns for follow up for atrial fibrillation and dofetilide  monitoring. She reports that she has done well from a cardiac standpoint. Her PPM shows <1% afib burden. No recent bleeding issues on anticoagulation.   Today, she  denies symptoms of palpitations, chest pain, shortness of breath, orthopnea, PND, lower extremity edema, dizziness, presyncope, syncope, snoring, daytime somnolence, bleeding, or neurologic sequela. The patient is tolerating medications without difficulties and is otherwise without complaint today.    Past Medical History:  Diagnosis Date   Antiphospholipid antibody positive 06/28/2017   Asthma    CAD (coronary artery disease)    a. normal cors by cath in 03/2015   Carotid artery disease    Chronic anticoagulation    Complete heart block (HCC) 02/2013   a. Implantation of a dual chamber pacemaker in 02/2013 by Dr Kelsie. STJ model number 2240 pacemaker with model number 2088 right ventricular lead.    Dyslipidemia    H/O Doppler ultrasound 10/03/2010   carotid duplex - R ICA normal patency; L CCA-ICA bypass gradt patent common to interal cartoid bypass graft    H/O mechanical aortic valve replacement    History of echocardiogram 11/08/2011   EF >55%; mild-mod concentric LVH; trace aortic regurg.; LA mild-mod dilated   History of nuclear stress test 01/20/2010   normal pattern of perfusion; low risk scan   Hypertension    Lupus (systemic lupus erythematosus) (HCC)    PVD (peripheral vascular disease)    60% R renal artery  stenosis; non-functioning L kidney    Current Outpatient Medications  Medication Sig Dispense Refill   acetaminophen  (TYLENOL ) 325 MG tablet Take 650 mg by mouth every 6 (six) hours as needed for moderate pain.  (Patient taking differently: Take 650 mg by mouth as needed for moderate pain (pain score 4-6).)     albuterol  (VENTOLIN  HFA) 108 (90 Base) MCG/ACT inhaler Inhale 2 puffs into the lungs every 6 (six) hours as needed for wheezing or shortness of breath. 24 g 3   allopurinol  (ZYLOPRIM ) 100 MG tablet Take 100 mg by mouth daily.      cetirizine (ZYRTEC) 10 MG tablet Take 10 mg by mouth at bedtime.     chlorhexidine  (PERIDEX ) 0.12 % solution Use as directed 10 mLs in the mouth or throat 2 (two) times daily as needed (before dental appts).   5   cholestyramine (QUESTRAN) 4 g packet Take 4 g by mouth daily.     clotrimazole-betamethasone (LOTRISONE) cream Apply 1 Application topically daily as needed (Rash).     dicyclomine (BENTYL) 20 MG tablet Take 20 mg by mouth daily.     dofetilide  (TIKOSYN ) 250 MCG capsule Take 1 capsule by mouth twice daily 180 capsule 3   ferrous sulfate 325 (65 FE) MG tablet Take 325 mg by mouth 2 (two) times daily with a meal.     fluticasone -salmeterol (WIXELA INHUB) 250-50 MCG/ACT AEPB USE 1 INHALATION BY MOUTH  twice daily 180 each 3   furosemide  (LASIX ) 40 MG tablet Take 1 tablet (40 mg total) by mouth daily. Take 80mg  X3  days then back to 40 mg daily 100 tablet 2   hydroxychloroquine  (PLAQUENIL ) 200 MG tablet Take 200 mg by mouth daily.     isosorbide  mononitrate (IMDUR ) 30 MG 24 hr tablet TAKE 1 TABLET BY MOUTH DAILY 90 tablet 2   leflunomide  (ARAVA ) 10 MG tablet Take 10 mg by mouth daily.     levothyroxine (SYNTHROID) 25 MCG tablet Take 25 mcg by mouth daily.     loratadine  (CLARITIN ) 10 MG tablet Take 10 mg by mouth daily.     losartan  (COZAAR ) 25 MG tablet Take 1 tablet by mouth once daily 90 tablet 3   Magnesium  200 MG TABS Take 1 tablet (200 mg total) by  mouth daily. 30 each    metoprolol  succinate (TOPROL -XL) 50 MG 24 hr tablet TAKE 1 TABLET BY MOUTH DAILY  WITH OR IMMEDIATELY FOLLOWING A  MEAL 100 tablet 2   predniSONE  (DELTASONE ) 5 MG tablet Take 5 mg by mouth daily.     Saccharomyces boulardii (PROBIOTIC) 250 MG CAPS Take 250 mg by mouth daily.     simvastatin  (ZOCOR ) 20 MG tablet Take 1 tablet by mouth at  bedtime 90 tablet 0   warfarin (COUMADIN ) 5 MG tablet TAKE 1 TO 1 AND 1/2 TABLETS BY  MOUTH DAILY AS DIRECTED BY  COUMADIN  CLINIC 150 tablet 1   No current facility-administered medications for this encounter.    ROS- All systems are reviewed and negative except as per the HPI above  Physical Exam: Vitals:   11/12/23 1314  BP: 120/70  Pulse: 63  Weight: 110.9 kg  Height: 5' 5 (1.651 m)     Wt Readings from Last 3 Encounters:  11/12/23 110.9 kg  10/30/23 109.5 kg  09/25/23 110.6 kg    GEN: Well nourished, well developed in no acute distress CARDIAC: Regular rate and rhythm, no murmurs, rubs, gallops. Mech click RESPIRATORY:  Clear to auscultation without rales, wheezing or rhonchi  ABDOMEN: Soft, non-tender, non-distended EXTREMITIES:  1+ bilateral lower extremity edema, No deformity    EKG today demonstrates AV dual paced rhythm Vent. rate 63 BPM PR interval 178 ms QRS duration 212 ms QT/QTcB 504/515 ms   Echo 02/01/23  1. Left ventricular ejection fraction, by estimation, is 50 to 55%. The  left ventricle has low normal function. The left ventricle has no regional  wall motion abnormalities. Septal-latearl dyssynchrony consistent with RV  pacing. The left ventricular internal cavity size was mildly dilated. Left ventricular diastolic parameters are consistent with Grade II diastolic dysfunction (pseudonormalization).   2. D-shaped interventricular septum suggestive of RV pressure/volume  overload. Right ventricular systolic function is mildly reduced. The right  ventricular size is moderately enlarged. There  is mildly elevated  pulmonary artery systolic pressure. The  estimated right ventricular systolic pressure is 39.0 mmHg.   3. Left atrial size was moderately dilated.   4. Right atrial size was severely dilated.   5. The mitral valve is abnormal. Moderate mitral valve regurgitation. No  evidence of mitral stenosis.   6. The tricuspid valve is abnormal. Tricuspid valve regurgitation is  severe.   7. There is a mechanical aortic valve prosthesis present. Trivial  perivalvular leakage. Mean gradient 14 mmHg, DI 0.65.   8. Aortic dilatation noted. There is mild dilatation of the ascending  aorta, measuring 38 mm.   9. The inferior vena cava is dilated in size with <50% respiratory  variability, suggesting right atrial pressure of 15 mmHg.    CHA2DS2-VASc Score = 4  The patient's score is based upon: CHF History: 1 HTN History: 1 Diabetes History: 0 Stroke History: 0 Vascular Disease History: 0 Age Score: 1 Gender Score: 1       ASSESSMENT AND PLAN: Persistent Atrial Fibrillation (ICD10:  I48.19) The patient's CHA2DS2-VASc score is 4, indicating a 4.8% annual risk of stroke.   S/p dofetilide  loading 2020 Patient appears to be maintaining SR. PPM shows <1% afib burden. Continue dofetilide  250 mcg BID Continue Toprol  50 mg daily Continue warfarin   Secondary Hypercoagulable State (ICD10:  D68.69) The patient is at significant risk for stroke/thromboembolism based upon her CHA2DS2-VASc Score of 4.  Continue Warfarin (Coumadin ). No bleeding issues.   High Risk Medication Monitoring (ICD 10: J342684) Patient requires ongoing monitoring for anti-arrhythmic medication which has the potential to cause life threatening arrhythmias. QT interval on ECG acceptable for dofetilide  monitoring. Check magnesium  today, recent bmet reviewed.   CHB S/p PPM, followed by Dr Inocencio  VHD AS s/p AVR 2006  HTN Stable on current regimen  Obesity Body mass index is 40.7 kg/m.  Encouraged  lifestyle modification  Chronic HFpEF EF 50-55% GDMT per primary cardiology team Fluid status appears stable today    Follow up in the AF clinic in one year.    Daril Kicks PA-C [facility] [address] [place], [place] [address] [phone]

## 2023-11-13 ENCOUNTER — Ambulatory Visit (HOSPITAL_COMMUNITY): Payer: Self-pay | Admitting: Physician Assistant

## 2023-11-13 LAB — MAGNESIUM: Magnesium: 1.9 mg/dL (ref 1.6–2.3)

## 2023-11-21 ENCOUNTER — Ambulatory Visit: Admitting: Internal Medicine

## 2023-11-21 DIAGNOSIS — Z952 Presence of prosthetic heart valve: Secondary | ICD-10-CM

## 2023-11-21 DIAGNOSIS — Z5181 Encounter for therapeutic drug level monitoring: Secondary | ICD-10-CM

## 2023-11-21 LAB — POCT INR: INR: 4.3 — AB (ref 2.0–3.0)

## 2023-11-21 NOTE — Progress Notes (Signed)
 INR 4.3 Please see anticoagulation encounter SELF TESTER- Called and spoke to pt and instructed her to hold warfarin today and take 0.5 tablet Friday then continue taking warfarin 1 tablet daily EXCEPT 1.5 tablets on Mondays and Fridays.  Recheck INR in 2 weeks.  Coumadin  Clinic [phone]

## 2023-11-25 ENCOUNTER — Ambulatory Visit

## 2023-11-25 DIAGNOSIS — I480 Paroxysmal atrial fibrillation: Secondary | ICD-10-CM | POA: Diagnosis not present

## 2023-11-27 LAB — CUP PACEART REMOTE DEVICE CHECK
Battery Remaining Longevity: 93 mo
Battery Remaining Percentage: 95.5 %
Battery Voltage: 2.99 V
Brady Statistic AP VP Percent: 84 %
Brady Statistic AP VS Percent: 1 %
Brady Statistic AS VP Percent: 15 %
Brady Statistic AS VS Percent: 1 %
Brady Statistic RA Percent Paced: 84 %
Brady Statistic RV Percent Paced: 99 %
Date Time Interrogation Session: 20251006020011
Implantable Lead Connection Status: 753985
Implantable Lead Connection Status: 753985
Implantable Lead Implant Date: 20150116
Implantable Lead Implant Date: 20150116
Implantable Lead Location: 753859
Implantable Lead Location: 753860
Implantable Lead Model: 5076
Implantable Pulse Generator Implant Date: 20250404
Lead Channel Impedance Value: 330 Ohm
Lead Channel Impedance Value: 530 Ohm
Lead Channel Pacing Threshold Amplitude: 0.5 V
Lead Channel Pacing Threshold Amplitude: 0.5 V
Lead Channel Pacing Threshold Pulse Width: 0.5 ms
Lead Channel Pacing Threshold Pulse Width: 0.5 ms
Lead Channel Sensing Intrinsic Amplitude: 10.4 mV
Lead Channel Sensing Intrinsic Amplitude: 3.3 mV
Lead Channel Setting Pacing Amplitude: 2 V
Lead Channel Setting Pacing Amplitude: 2.5 V
Lead Channel Setting Pacing Pulse Width: 0.5 ms
Lead Channel Setting Sensing Sensitivity: 4 mV
Pulse Gen Model: 2272
Pulse Gen Serial Number: 8257369

## 2023-11-27 NOTE — Progress Notes (Signed)
 Remote PPM Transmission

## 2023-11-29 ENCOUNTER — Ambulatory Visit: Payer: Self-pay | Admitting: Cardiology

## 2023-12-02 NOTE — Progress Notes (Signed)
 Remote PPM Transmission

## 2023-12-04 ENCOUNTER — Other Ambulatory Visit: Payer: Self-pay | Admitting: Cardiovascular Disease

## 2023-12-05 ENCOUNTER — Ambulatory Visit (INDEPENDENT_AMBULATORY_CARE_PROVIDER_SITE_OTHER): Payer: Self-pay | Admitting: *Deleted

## 2023-12-05 DIAGNOSIS — Z5181 Encounter for therapeutic drug level monitoring: Secondary | ICD-10-CM | POA: Diagnosis not present

## 2023-12-05 DIAGNOSIS — Z952 Presence of prosthetic heart valve: Secondary | ICD-10-CM

## 2023-12-05 DIAGNOSIS — I480 Paroxysmal atrial fibrillation: Secondary | ICD-10-CM

## 2023-12-05 LAB — POCT INR: INR: 3.4 — AB (ref 2.0–3.0)

## 2023-12-05 NOTE — Progress Notes (Signed)
 Lab Results  Component Value Date   INR 3.4 (A) 12/05/2023   INR 4.3 (A) 11/21/2023   INR 4.8 (A) 11/07/2023    Description   SELF TESTER- Called and spoke to pt and instructed her to continue taking warfarin 1 tablet daily EXCEPT 1.5 tablets on Mondays and Fridays.  Recheck INR in 2 weeks.  Coumadin  Clinic [phone]

## 2023-12-05 NOTE — Patient Instructions (Signed)
 Description   SELF TESTER- Called and spoke to pt and instructed her to continue taking warfarin 1 tablet daily EXCEPT 1.5 tablets on Mondays and Fridays.  Recheck INR in 2 weeks.  Coumadin  Clinic [phone]

## 2023-12-19 ENCOUNTER — Ambulatory Visit (INDEPENDENT_AMBULATORY_CARE_PROVIDER_SITE_OTHER): Admitting: Student in an Organized Health Care Education/Training Program

## 2023-12-19 DIAGNOSIS — Z952 Presence of prosthetic heart valve: Secondary | ICD-10-CM | POA: Diagnosis not present

## 2023-12-19 DIAGNOSIS — Z5181 Encounter for therapeutic drug level monitoring: Secondary | ICD-10-CM

## 2023-12-19 LAB — POCT INR: INR: 2.3 (ref 2.0–3.0)

## 2023-12-19 NOTE — Progress Notes (Signed)
 INR 2.3  Please see anticoagulation encounter SELF TESTER- Called and spoke to pt and instructed her to take 1.5 tablets today only then continue taking warfarin 1 tablet daily EXCEPT 1.5 tablets on Mondays and Fridays.  Recheck INR in 2 weeks.  Coumadin  Clinic [phone]

## 2024-01-02 ENCOUNTER — Ambulatory Visit: Admitting: Internal Medicine

## 2024-01-02 DIAGNOSIS — Z5181 Encounter for therapeutic drug level monitoring: Secondary | ICD-10-CM

## 2024-01-02 DIAGNOSIS — Z952 Presence of prosthetic heart valve: Secondary | ICD-10-CM

## 2024-01-02 LAB — POCT INR: INR: 5.7 — AB (ref 2.0–3.0)

## 2024-01-02 NOTE — Progress Notes (Signed)
 INR 5.7 Please see anticoagulation encounter SELF TESTER- Called and spoke to pt and instructed her to Hold today and Friday only then continue taking warfarin 1 tablet daily EXCEPT 1.5 tablets on Mondays and Fridays.  Recheck INR in 1 week.  Coumadin  Clinic [phone]

## 2024-01-09 ENCOUNTER — Ambulatory Visit (INDEPENDENT_AMBULATORY_CARE_PROVIDER_SITE_OTHER): Admitting: Cardiovascular Disease

## 2024-01-09 DIAGNOSIS — Z5181 Encounter for therapeutic drug level monitoring: Secondary | ICD-10-CM | POA: Diagnosis not present

## 2024-01-09 DIAGNOSIS — Z952 Presence of prosthetic heart valve: Secondary | ICD-10-CM | POA: Diagnosis not present

## 2024-01-09 LAB — POCT INR: INR: 3.5 — AB (ref 2.0–3.0)

## 2024-01-09 NOTE — Progress Notes (Signed)
 INR 3.5 Please see anticoagulation encounter SELF TESTER- Called and spoke to pt: continue taking warfarin 1 tablet daily EXCEPT 1.5 tablets on Mondays and Fridays.  Recheck INR in 2 weeks.  Coumadin  Clinic [phone]

## 2024-01-19 LAB — POCT INR: INR: 3.5 — AB (ref 2.0–3.0)

## 2024-01-20 ENCOUNTER — Ambulatory Visit (INDEPENDENT_AMBULATORY_CARE_PROVIDER_SITE_OTHER): Admitting: Cardiology

## 2024-01-20 DIAGNOSIS — Z5181 Encounter for therapeutic drug level monitoring: Secondary | ICD-10-CM | POA: Diagnosis not present

## 2024-01-20 DIAGNOSIS — Z952 Presence of prosthetic heart valve: Secondary | ICD-10-CM | POA: Diagnosis not present

## 2024-01-20 NOTE — Progress Notes (Signed)
 INR 3.5 Please see anticoagulation encounter SELF TESTER- Called and spoke to pt: continue taking warfarin 1 tablet daily EXCEPT 1.5 tablets on Mondays and Fridays.  Recheck INR in 2 weeks.  Coumadin  Clinic [phone]

## 2024-01-22 ENCOUNTER — Other Ambulatory Visit (HOSPITAL_COMMUNITY)

## 2024-01-23 ENCOUNTER — Ambulatory Visit: Admitting: Cardiology

## 2024-01-23 DIAGNOSIS — I4819 Other persistent atrial fibrillation: Secondary | ICD-10-CM | POA: Diagnosis not present

## 2024-01-23 DIAGNOSIS — Z5181 Encounter for therapeutic drug level monitoring: Secondary | ICD-10-CM | POA: Diagnosis not present

## 2024-01-23 LAB — POCT INR: INR: 5.4 — AB (ref 2.0–3.0)

## 2024-01-23 NOTE — Patient Instructions (Signed)
 Description   SELF TESTER- INR 5.4; Called and spoke to pt: Instructed her to hold warfarin today and tomorrow then continue taking warfarin 1 tablet daily EXCEPT 1.5 tablets on Mondays and Fridays.  Recheck INR in 1 week (usually 2 weeks) Coumadin  Clinic [phone]

## 2024-01-23 NOTE — Progress Notes (Signed)
 Lab Results  Component Value Date   INR 5.4 (A) 01/23/2024   INR 3.5 (A) 01/19/2024   INR 3.5 (A) 01/09/2024    Description   SELF TESTER- INR 5.4; Called and spoke to pt: Instructed her to hold warfarin today and tomorrow then continue taking warfarin 1 tablet daily EXCEPT 1.5 tablets on Mondays and Fridays.  Recheck INR in 1 week (usually 2 weeks) Coumadin  Clinic [phone]

## 2024-01-27 NOTE — Progress Notes (Unsigned)
 Cardiology Office Note:  .   Date:  01/27/2024  ID:  [name], DOB 12/15/1950, MRN [id number] PCP: Leonce Sink, MD  [facility] Providers Cardiologist:  Dorn Lesches, MD Electrophysiologist:  Soyla Gladis Norton, MD {  History of Present Illness: .   [name] is a 73 y.o. female w/PMHx of  Lupus, HTN, renal artery stenosis (remotely 60% R renal artery stenosis; non-functioning L kidney), HLD, CKD (III) VHD (mechanical AVR 2006) PVD (left CEA 2000) CHB w/PPM AFib  She saw the AFib clinic team 05/14/23, doing well, low AF burden, pending gen change   Saw Dr. Lesches 05/15/23, doing well, planned for echo ~ Dec  PPM gen change on 05/24/23  06/06/23 : wound check reported to be presenting AF/VP (burden <1%)  4/21: pt reported concerns of lump at site, brought in, site felt to be stable, healing normally, (seen by myself with device RN)  I saw her 09/25/23 She is doing well [place] No pacer site concerns, healed up well No CP, palpitations or cardiac awareness No rest SOB, no difficulties with ADLs, gets winded with longer walkes/heavier activities, reports at her baseline for several years No near syncope or syncope Stable QTc <1% AFib burden  She saw Dr. Wadie 10/30/23, doing well, echo Dec 2024, well functioning mechanical AVR, preserved LVEF No symptoms/concerns, no changes were made  Saw the AFib clinic 11/12/23, again, doing well, low Afib burden, no changes made   Today's visit is scheduled as a 4 mo tikosyn  visit ROS:   Cardiac-wise she is doing well No CP, palpitations or cardiac awareness No near syncope or syncope No bleeding She has excellent medication compliance She is struggling with some GI symptoms, stomach ache not nauseous, no vomiting but a dull ache Seemed to transiently localise to her R flank, then seemed to go away. Come/go, but her diet not as usual and thinks that is why her INR  got out of control. She has reached out to her PMD, they were able to move her visit up some but not until next week. She had 2 days last month of diarrhea that reminded her of coffee grounds, but has since normalized   Device information Abbott dual chamber PPM implanted 03/06/2013, gen change 05/24/23  Arrhythmia/AAD hx Tikosyn   started Feb 2020  Studies Reviewed: [name]    EKG done today and reviewed by myself:  AV paced, broad QRS , given this QTc is stable  09/25/23: SR/V paced, PAC (confirmed by device check), QTc given broad paced QRS is OK (~480ms)  Limited DEVICE interrogation done today for AFib burden, and reviewed by myself Battery and lead measurements are good AF burden <1% No HVR episodes   02/01/23: TTE 1. Left ventricular ejection fraction, by estimation, is 50 to 55%. The  left ventricle has low normal function. The left ventricle has no regional  wall motion abnormalities. Septal-latearl dyssynchrony consistent with RV  pacing. The left ventricular  internal cavity size was mildly dilated. Left ventricular diastolic  parameters are consistent with Grade II diastolic dysfunction  (pseudonormalization).   2. D-shaped interventricular septum suggestive of RV pressure/volume  overload. Right ventricular systolic function is mildly reduced. The right  ventricular size is moderately enlarged. There is mildly elevated  pulmonary artery systolic pressure. The  estimated right ventricular systolic pressure is 39.0 mmHg.   3. Left atrial size was moderately dilated.   4. Right atrial size was  severely dilated.   5. The mitral valve is abnormal. Moderate mitral valve regurgitation. No  evidence of mitral stenosis.   6. The tricuspid valve is abnormal. Tricuspid valve regurgitation is  severe.   7. There is a mechanical aortic valve prosthesis present. Trivial  perivalvular leakage. Mean gradient 14 mmHg, DI 0.65.   8. Aortic dilatation noted. There is mild dilatation  of the ascending  aorta, measuring 38 mm.   9. The inferior vena cava is dilated in size with <50% respiratory  variability, suggesting right atrial pressure of 15 mmHg.    Risk Assessment/Calculations:    Physical Exam:   VS:  There were no vitals taken for this visit.   Wt Readings from Last 3 Encounters:  11/12/23 244 lb 9.6 oz (110.9 kg)  10/30/23 241 lb 6.4 oz (109.5 kg)  09/25/23 243 lb 12.8 oz (110.6 kg)    GEN: Well nourished, well developed in no acute distress NECK: No JVD; No carotid bruits CARDIAC: RRR, no murmurs, rubs, gallops RESPIRATORY: CTA b/l without rales, wheezing or rhonchi  ABDOMEN: Soft, non-tender, non-distended EXTREMITIES: 1+ edema b/l LE; No deformity   PPM site: is stable, no thinning, fluctuation, tethering  ASSESSMENT AND PLAN: .    PPM intact function no programming changes made  paroxysmal AFib CHA2DS2Vasc is 4, on warfarin Chronic Tikosyn , QTc is OK <1 % burden Med list looks OK Labs today  Re-educated on importance of potential medication interactions, particularly with some GI issues of late She was aware  AVR (mechanical) Echo done today via Dr. Court C/w Dr. Brenna   Secondary hypercoagulable state  5. DOE with increased exertion/edema No real complaints of DOE today, she has some edema today that she reports waxes/wanes, has gotten advise from her nephrologist for PRN lasix  use Echo today via Dr. Court (pending) C/w Dr. Brenna  Dispo: back in 10mo, sooner if needed  Signed, Stana Bayon Macario Arthur, PA-C

## 2024-01-28 ENCOUNTER — Ambulatory Visit: Attending: Cardiology | Admitting: Physician Assistant

## 2024-01-28 ENCOUNTER — Ambulatory Visit (HOSPITAL_COMMUNITY)
Admission: RE | Admit: 2024-01-28 | Discharge: 2024-01-28 | Attending: Cardiovascular Disease | Admitting: Cardiovascular Disease

## 2024-01-28 VITALS — BP 124/62 | HR 66 | Ht 65.0 in | Wt 244.0 lb

## 2024-01-28 DIAGNOSIS — Z952 Presence of prosthetic heart valve: Secondary | ICD-10-CM

## 2024-01-28 DIAGNOSIS — E782 Mixed hyperlipidemia: Secondary | ICD-10-CM

## 2024-01-28 DIAGNOSIS — Z5181 Encounter for therapeutic drug level monitoring: Secondary | ICD-10-CM

## 2024-01-28 DIAGNOSIS — I10 Essential (primary) hypertension: Secondary | ICD-10-CM | POA: Diagnosis not present

## 2024-01-28 DIAGNOSIS — D6869 Other thrombophilia: Secondary | ICD-10-CM

## 2024-01-28 DIAGNOSIS — I480 Paroxysmal atrial fibrillation: Secondary | ICD-10-CM

## 2024-01-28 DIAGNOSIS — Z950 Presence of cardiac pacemaker: Secondary | ICD-10-CM

## 2024-01-28 LAB — CUP PACEART INCLINIC DEVICE CHECK
Battery Remaining Longevity: 90 mo
Battery Voltage: 2.99 V
Brady Statistic RA Percent Paced: 77 %
Brady Statistic RV Percent Paced: 99.91 %
Date Time Interrogation Session: 20251209172145
Implantable Lead Connection Status: 753985
Implantable Lead Connection Status: 753985
Implantable Lead Implant Date: 20150116
Implantable Lead Implant Date: 20150116
Implantable Lead Location: 753859
Implantable Lead Location: 753860
Implantable Lead Model: 5076
Implantable Pulse Generator Implant Date: 20250404
Lead Channel Impedance Value: 337.5 Ohm
Lead Channel Impedance Value: 537.5 Ohm
Lead Channel Sensing Intrinsic Amplitude: 10.4 mV
Lead Channel Sensing Intrinsic Amplitude: 3.4 mV
Lead Channel Setting Pacing Amplitude: 2 V
Lead Channel Setting Pacing Amplitude: 2.5 V
Lead Channel Setting Pacing Pulse Width: 0.5 ms
Lead Channel Setting Sensing Sensitivity: 4 mV
Pulse Gen Model: 2272
Pulse Gen Serial Number: 8257369

## 2024-01-28 NOTE — Patient Instructions (Addendum)
 Medication Instructions:   Your physician recommends that you continue on your current medications as directed. Please refer to the Current Medication list given to you today.     *If you need a refill on your cardiac medications before your next appointment, please call your pharmacy*    Lab Work:  PLEASE GO DOWN STAIRS  [facility]  FIRST FLOOR   ( GET OFF ELEVATORS WALK TOWARDS WAITING AREA LAB LOCATED BY PHARMACY):  BMET MAG AND CBC TODAY        If you have labs (blood work) drawn today and your tests are completely normal, you will receive your results only by: MyChart Message (if you have MyChart) OR A paper copy in the mail If you have any lab test that is abnormal or we need to change your treatment, we will call you to review the results.  Testing/Procedures:  NONE ORDERED  TODAY    Follow-Up: At [facility], you and your health needs are our priority.  As part of our continuing mission to provide you with exceptional heart care, our providers are all part of one team.  This team includes your primary Cardiologist (physician) and Advanced Practice Providers or APPs (Physician Assistants and Nurse Practitioners) who all work together to provide you with the care you need, when you need it.  Your next appointment:    6 month(s)      Provider:    You may see Will Gladis Norton, MD  or one of the following Advanced Practice Providers on your designated Care Team:   Charlies Arthur, [place]    We recommend signing up for the patient portal called MyChart.  Sign up information is provided on this After Visit Summary.  MyChart is used to connect with patients for Virtual Visits (Telemedicine).  Patients are able to view lab/test results, encounter notes, upcoming appointments, etc.  Non-urgent messages can be sent to your provider as well.   To learn more about what you can do with MyChart, go to [email or web address].   Other Instructions

## 2024-01-29 ENCOUNTER — Ambulatory Visit: Payer: Self-pay | Admitting: Cardiovascular Disease

## 2024-01-29 ENCOUNTER — Ambulatory Visit: Payer: Self-pay | Admitting: Cardiology

## 2024-01-29 DIAGNOSIS — Z952 Presence of prosthetic heart valve: Secondary | ICD-10-CM

## 2024-01-29 DIAGNOSIS — I10 Essential (primary) hypertension: Secondary | ICD-10-CM

## 2024-01-29 DIAGNOSIS — E782 Mixed hyperlipidemia: Secondary | ICD-10-CM

## 2024-01-29 LAB — ECHOCARDIOGRAM COMPLETE
AR max vel: 0.71 cm2
AV Area VTI: 0.78 cm2
AV Area mean vel: 0.71 cm2
AV Mean grad: 14 mmHg
AV Peak grad: 25.4 mmHg
Ao pk vel: 2.52 m/s
Area-P 1/2: 4.8 cm2
MV M vel: 4.89 m/s
MV Peak grad: 95.6 mmHg
Radius: 0.8 cm
S' Lateral: 4.1 cm

## 2024-01-29 LAB — CBC
Hematocrit: 33.4 % — ABNORMAL LOW (ref 34.0–46.6)
Hemoglobin: 10.2 g/dL — ABNORMAL LOW (ref 11.1–15.9)
MCH: 28.9 pg (ref 26.6–33.0)
MCHC: 30.5 g/dL — ABNORMAL LOW (ref 31.5–35.7)
MCV: 95 fL (ref 79–97)
Platelets: 212 x10E3/uL (ref 150–450)
RBC: 3.53 x10E6/uL — ABNORMAL LOW (ref 3.77–5.28)
RDW: 15 % (ref 11.7–15.4)
WBC: 6.4 x10E3/uL (ref 3.4–10.8)

## 2024-01-29 LAB — BASIC METABOLIC PANEL WITH GFR
BUN/Creatinine Ratio: 24 (ref 12–28)
BUN: 35 mg/dL — AB (ref 8–27)
CO2: 19 mmol/L — AB (ref 20–29)
Calcium: 9.9 mg/dL (ref 8.7–10.3)
Chloride: 105 mmol/L (ref 96–106)
Creatinine, Ser: 1.46 mg/dL — AB (ref 0.57–1.00)
Glucose: 84 mg/dL (ref 70–99)
Potassium: 3.7 mmol/L (ref 3.5–5.2)
Sodium: 142 mmol/L (ref 134–144)
eGFR: 38 mL/min/1.73 — AB (ref >59)

## 2024-01-29 LAB — MAGNESIUM: Magnesium: 1.8 mg/dL (ref 1.6–2.3)

## 2024-01-30 ENCOUNTER — Ambulatory Visit (INDEPENDENT_AMBULATORY_CARE_PROVIDER_SITE_OTHER): Admitting: Cardiovascular Disease

## 2024-01-30 DIAGNOSIS — Z5181 Encounter for therapeutic drug level monitoring: Secondary | ICD-10-CM

## 2024-01-30 DIAGNOSIS — I480 Paroxysmal atrial fibrillation: Secondary | ICD-10-CM | POA: Diagnosis not present

## 2024-01-30 LAB — POCT INR: INR: 2.8 (ref 2.0–3.0)

## 2024-01-30 NOTE — Patient Instructions (Signed)
 Description   SELF TESTER- INR 2.8; Called and spoke to pt and instructed her to START taking warfarin 1 tablet daily except for 1.5 tablets on Mondays.  Recheck INR in 1 week (usually 2 weeks) Coumadin  Clinic [phone]

## 2024-01-30 NOTE — Progress Notes (Signed)
 Lab Results  Component Value Date   INR 2.8 01/30/2024   INR 5.4 (A) 01/23/2024   INR 3.5 (A) 01/19/2024    Description   SELF TESTER- INR 2.8; Called and spoke to pt and instructed her to START taking warfarin 1 tablet daily except for 1.5 tablets on Mondays.  Recheck INR in 1 week (usually 2 weeks) Coumadin  Clinic [phone]

## 2024-02-03 ENCOUNTER — Ambulatory Visit: Payer: Medicare Other | Admitting: Internal Medicine

## 2024-02-04 ENCOUNTER — Telehealth: Payer: Self-pay

## 2024-02-04 NOTE — Telephone Encounter (Signed)
 Copied from CRM #[id number]. Topic: Appointments - Transfer of Care >> Feb 03, 2024  4:37 PM Rilla B wrote: Pt is requesting to transfer FROM: Dr Neysa Pt is requesting to transfer TO: Dr Olena Reason for requested transfer: Dr Neysa retiring It is the responsibility of the team the patient would like to transfer to (Dr. Olena) to reach out to the patient if for any reason this transfer is not acceptable.  Dr Olena, is this okay? Pt does have an upcoming appt with you in January 2026.

## 2024-02-04 NOTE — Telephone Encounter (Signed)
 Fine with me

## 2024-02-06 ENCOUNTER — Ambulatory Visit: Admitting: Internal Medicine

## 2024-02-06 ENCOUNTER — Ambulatory Visit: Payer: Self-pay | Admitting: Internal Medicine

## 2024-02-06 DIAGNOSIS — Z5181 Encounter for therapeutic drug level monitoring: Secondary | ICD-10-CM | POA: Diagnosis not present

## 2024-02-06 DIAGNOSIS — Z952 Presence of prosthetic heart valve: Secondary | ICD-10-CM

## 2024-02-06 LAB — POCT INR: INR: 5.6 — AB (ref 2.0–3.0)

## 2024-02-06 NOTE — Progress Notes (Signed)
 INR 5.6 Please see anticoagulation encounter SELF TESTER- spoke to pt and instructed her to Hold today and tomorrow then continue taking warfarin 1 tablet daily except for 1.5 tablets on Mondays.  Recheck INR in 1 week (usually 2 weeks) Coumadin  Clinic [phone]

## 2024-02-14 ENCOUNTER — Ambulatory Visit (INDEPENDENT_AMBULATORY_CARE_PROVIDER_SITE_OTHER): Admitting: Pharmacist

## 2024-02-14 DIAGNOSIS — I4819 Other persistent atrial fibrillation: Secondary | ICD-10-CM

## 2024-02-14 DIAGNOSIS — Z5181 Encounter for therapeutic drug level monitoring: Secondary | ICD-10-CM | POA: Diagnosis not present

## 2024-02-14 DIAGNOSIS — Z952 Presence of prosthetic heart valve: Secondary | ICD-10-CM | POA: Diagnosis not present

## 2024-02-14 LAB — POCT INR: INR: 4.7 — AB (ref 2.0–3.0)

## 2024-02-14 NOTE — Patient Instructions (Signed)
 Description   SELF TESTER-  INR 4.7: spoke to pt and instructed her to Hold today and tomorrow then continue taking warfarin 1 tablet daily except for 1.5 tablets on Mondays. Patient has been sick and not eating. Recheck INR in 1 week (usually 2 weeks) Coumadin  Clinic [phone]

## 2024-02-14 NOTE — Progress Notes (Signed)
 Description   SELF TESTER-  INR 4.7: spoke to pt and instructed her to Hold today and tomorrow then continue taking warfarin 1 tablet daily except for 1.5 tablets on Mondays. Patient has been sick and not eating. Recheck INR in 1 week (usually 2 weeks) Coumadin  Clinic [phone]

## 2024-02-19 ENCOUNTER — Telehealth: Payer: Self-pay | Admitting: Cardiovascular Disease

## 2024-02-19 NOTE — Telephone Encounter (Signed)
 Call to patient to clarify she is talking about scan report mentioned in visit note from 02/18/24 with Dr. Helene Headland. Patient confirms this is correct, verbalizes understanding that Dr. Court is out of town til 02/24/23 and that he will review when he returns.

## 2024-02-19 NOTE — Telephone Encounter (Signed)
 Patient stated she had a CT scan done at [facility] and stated Dr. Court can request results from that office, phone# [phone].  Patient stated she would like Dr. Court to review the results and advice on next steps.

## 2024-02-21 ENCOUNTER — Telehealth: Payer: Self-pay | Admitting: *Deleted

## 2024-02-21 ENCOUNTER — Ambulatory Visit: Admitting: *Deleted

## 2024-02-21 DIAGNOSIS — Z5181 Encounter for therapeutic drug level monitoring: Secondary | ICD-10-CM | POA: Diagnosis not present

## 2024-02-21 DIAGNOSIS — Z952 Presence of prosthetic heart valve: Secondary | ICD-10-CM

## 2024-02-21 DIAGNOSIS — I480 Paroxysmal atrial fibrillation: Secondary | ICD-10-CM | POA: Diagnosis not present

## 2024-02-21 LAB — POCT INR: INR: 2.5 (ref 2.0–3.0)

## 2024-02-21 NOTE — Telephone Encounter (Signed)
 Called patient since INR is due; she states she will get it done soon and call back. Will await.

## 2024-02-21 NOTE — Patient Instructions (Signed)
 Description   SELF TESTER INR-2.5; Spoke to pt and instructed her to continue taking warfarin 1 tablet daily except for 1.5 tablets on Mondays. Recheck INR in 2 weeks (usually 2 weeks) Coumadin  Clinic [phone]

## 2024-02-21 NOTE — Progress Notes (Signed)
 INR Goal 2.5-3.5  Description   SELF TESTER INR-2.5; Spoke to pt and instructed her to continue taking warfarin 1 tablet daily except for 1.5 tablets on Mondays. Recheck INR in 2 weeks (usually 2 weeks) Coumadin  Clinic [phone]

## 2024-02-24 ENCOUNTER — Ambulatory Visit

## 2024-02-24 DIAGNOSIS — I480 Paroxysmal atrial fibrillation: Secondary | ICD-10-CM | POA: Diagnosis not present

## 2024-02-24 LAB — CUP PACEART REMOTE DEVICE CHECK
Battery Remaining Longevity: 94 mo
Battery Remaining Percentage: 93 %
Battery Voltage: 2.99 V
Brady Statistic AP VP Percent: 74 %
Brady Statistic AP VS Percent: 1 %
Brady Statistic AS VP Percent: 26 %
Brady Statistic AS VS Percent: 1 %
Brady Statistic RA Percent Paced: 73 %
Brady Statistic RV Percent Paced: 99 %
Date Time Interrogation Session: 20260105020019
Implantable Lead Connection Status: 753985
Implantable Lead Connection Status: 753985
Implantable Lead Implant Date: 20150116
Implantable Lead Implant Date: 20150116
Implantable Lead Location: 753859
Implantable Lead Location: 753860
Implantable Lead Model: 5076
Implantable Pulse Generator Implant Date: 20250404
Lead Channel Impedance Value: 380 Ohm
Lead Channel Impedance Value: 560 Ohm
Lead Channel Pacing Threshold Amplitude: 0.5 V
Lead Channel Pacing Threshold Amplitude: 0.5 V
Lead Channel Pacing Threshold Pulse Width: 0.5 ms
Lead Channel Pacing Threshold Pulse Width: 0.5 ms
Lead Channel Sensing Intrinsic Amplitude: 3.6 mV
Lead Channel Sensing Intrinsic Amplitude: 9.4 mV
Lead Channel Setting Pacing Amplitude: 2 V
Lead Channel Setting Pacing Amplitude: 2.5 V
Lead Channel Setting Pacing Pulse Width: 0.5 ms
Lead Channel Setting Sensing Sensitivity: 4 mV
Pulse Gen Model: 2272
Pulse Gen Serial Number: 8257369

## 2024-02-26 ENCOUNTER — Ambulatory Visit: Payer: Self-pay | Admitting: Cardiology

## 2024-02-27 NOTE — Telephone Encounter (Signed)
 Court Dorn PARAS, MD to Me (Selected Message)     02/26/24  1:25 PM The CT scan showed cardiomegaly which we already know by her recent 2D echo cardiogram last month.  There were no new additional findings requiring discussion.  Spoke with pt regarding Dr. Ranee reply to CT scan. Pt verbalizes understanding.

## 2024-02-27 NOTE — Progress Notes (Signed)
 Remote PPM Transmission

## 2024-03-04 ENCOUNTER — Encounter: Payer: Self-pay | Admitting: Pulmonary Disease

## 2024-03-04 ENCOUNTER — Ambulatory Visit: Admitting: Pulmonary Disease

## 2024-03-04 VITALS — BP 104/62 | HR 64 | Temp 97.6°F | Ht 62.0 in | Wt 251.8 lb

## 2024-03-04 DIAGNOSIS — J45909 Unspecified asthma, uncomplicated: Secondary | ICD-10-CM

## 2024-03-04 DIAGNOSIS — I2781 Cor pulmonale (chronic): Secondary | ICD-10-CM | POA: Diagnosis not present

## 2024-03-04 DIAGNOSIS — I5023 Acute on chronic systolic (congestive) heart failure: Secondary | ICD-10-CM

## 2024-03-04 MED ORDER — ALBUTEROL SULFATE HFA 108 (90 BASE) MCG/ACT IN AERS: 2.0000 | INHALATION_SPRAY | Freq: Four times a day (QID) | RESPIRATORY_TRACT | 3 refills | Status: AC | PRN

## 2024-03-04 MED ORDER — FLUTICASONE-SALMETEROL 250-50 MCG/ACT IN AEPB: INHALATION_SPRAY | RESPIRATORY_TRACT | 3 refills | Status: AC

## 2024-03-04 NOTE — Assessment & Plan Note (Signed)
 See above

## 2024-03-04 NOTE — Progress Notes (Signed)
 "  Established Patient Pulmonology Office Visit   Subjective:  Patient ID: [name], female    DOB: [birth year]   MRN: [id number]  CC:  Chief Complaint  Patient presents with   Asthma    Pt c/o worsening SOB with exertion over the last few weeks. Denies coughing.  Pt states that other than her current SOB, asthma has not bothered her.     [name] is a 74 y.o. never-smoker with history of asthma and SLE, s/p AVR + PPM on warfarin, HFrEF (40-45% in 3/26), who presents for follow up.  Patient reports she is taking 1 puff of Wixela daily (not 1 puff BID as prescribed). Corrective guidance provided. Regardless, her ACT score is 23 today. She also has an albuterol  inhaler which she has not needed to use.  The bigger problem she has been having relates to her HFrEF which appears to be in a decompensated state.   Pulm Questionnaires:  Asthma Control Test ACT Total Score  03/04/2024  2:07 PM 23  02/05/2022  2:02 PM 23  02/02/2021  3:40 PM 24    ROS   Current Medications[1]      Objective:  BP 104/62   Pulse 64   Temp 97.6 F (36.4 C)   Ht 5' 2 (1.575 m)   Wt 251 lb 12.8 oz (114.2 kg)   SpO2 96%   BMI 46.05 kg/m    Physical Exam Constitutional:      Appearance: Normal appearance.  HENT:     Head: Normocephalic and atraumatic.     Mouth/Throat:     Mouth: Mucous membranes are moist.     Pharynx: Oropharynx is clear. No oropharyngeal exudate or posterior oropharyngeal erythema.  Eyes:     General: No scleral icterus.    Conjunctiva/sclera: Conjunctivae normal.  Cardiovascular:     Rate and Rhythm: Normal rate and regular rhythm.     Heart sounds: Normal heart sounds. No murmur heard.    No friction rub. No gallop.  Pulmonary:     Effort: Pulmonary effort is normal. No respiratory distress.     Breath sounds: Normal breath sounds. No stridor. No wheezing, rhonchi or rales.  Musculoskeletal:     Right lower leg: No edema.     Left lower leg: No edema.   Neurological:     Mental Status: She is alert and oriented to person, place, and time.  Psychiatric:        Thought Content: Thought content normal.      Diagnostic Review:    Serum HCO3: CO2  Date/Time Value Ref Range Status  01/28/2024 03:29 PM 19 (L) 20 - 29 mmol/L Final  09/25/2023 11:03 AM 21 20 - 29 mmol/L Final  05/14/2023 01:30 PM 22 22 - 32 mmol/L Final    Hemoglobin A1c:    Component Value Date/Time   HGBA1C 6.0 (H) 01/27/2015 2320    TSH: TSH  Date/Time Value Ref Range Status  03/17/2018 02:15 PM 3.900 0.450 - 4.500 uIU/mL Final  12/21/2015 10:35 PM 3.022 0.350 - 4.500 uIU/mL Final    Comment:    Performed by a 3rd Generation assay with a functional sensitivity of <=0.01 uIU/mL.  03/06/2013 05:05 PM 2.666 0.350 - 4.500 uIU/mL Final    Comment:    Performed at [facility]    Iron Studies: No results found for: IRON, TIBC, IRONPCTSAT, FERRITIN  ABG:    Component Value Date/Time   TCO2 31 09/07/2017 1158  VBG: No results found for: PHVEN, PCO2VEN, PO2VEN      Assessment & Plan:   Assessment & Plan Acute on chronic systolic heart failure (HCC) She is volume overloaded on exam with 3+ pitting edema in lower extremities bilaterally, her weight has reportedly increased from 242 lbs about 3 weeks ago up to 251 lbs today, she has worsening DOE (her primary complaint today) without any wheezing on exam, worsening nausea and PO intolerance.  There is evidence of worsening right heart dysfunction (which appears readily attributable to her left-sided heart failure with some decrement to her LVEF in 01/2024 -- see below): - CT A/P performed 02/18/2024 at [facility] showed reflux of contrast into the IVC -- it also showed small bilateral pleural effusions). - TTE performed 01/28/2024 at [facility] showed slight reduction of LVEF to 40-45% (c/w 50-55% in 01/2023), severe RA and RV dilation (previously mildly reduced RV function, moderate  RV enlargement), est RA pressure of 15 mmHg. RVSP not able to be accurately determined. 2+ MR. Normal function of AVR.  Instructed patient to double her Lasix  dose to 40 mg PO BID today as well as the next 5 days.  She needs follow up with her PCP within 2 weeks to ensure weights are down and symptoms at least partially improved and BMP check at that time as well.  I've requested she call her cardiologist's office to ensure she has follow up with them within next 4-8 weeks (unclear when next appt is scheduled for at present) given this decompensation of her heart failure. I will also communicate with her cardiologist, Dr. Court, directly about these findings and plan of care.  She was instructed to monitor her weight daily and she needs to notify at least one of either myself, her PCP or her cardiologist if it increases by an additional 5 lbs relative to her weight today as this would indicate that increased diuretic dose is not achieving adequate effect. I did emphasize the importance of this in no uncertain terms with respect to risk of needing hospitalization for IV diuretic administration if we do not see significant improvement within the next few days.    Cor pulmonale (chronic) (HCC) See above.    Uncomplicated asthma, unspecified asthma severity, unspecified whether persistent Continue Wixela. Counseled patient that the medication is supposed to be taken as 1 inhalation twice daily. Orders:   albuterol  (VENTOLIN  HFA) 108 (90 Base) MCG/ACT inhaler; Inhale 2 puffs into the lungs every 6 (six) hours as needed for wheezing or shortness of breath.   fluticasone -salmeterol (WIXELA INHUB) 250-50 MCG/ACT AEPB; USE 1 INHALATION BY MOUTH  twice daily   Return in about 1 year (around 03/04/2025) for Follow up: Pulmonary (Asthma).   Time spent on day of this encounter (includes time spent face-to-face with the patient as well as time spent the same day as the encounter reviewing existing data and  notes, and/or documenting my findings and the plan of care): 45 minutes  Extended duration of this visit was necessary due to the severity and medical complexity of this patient's condition, as well as the need to coordinate with other members of her care team.  Lamar JINNY Dales, MD      [1]  Current Outpatient Medications:    acetaminophen  (TYLENOL ) 325 MG tablet, Take 650 mg by mouth every 6 (six) hours as needed for moderate pain. , Disp: , Rfl:    allopurinol  (ZYLOPRIM ) 100 MG tablet, Take 100 mg by mouth daily. , Disp: , Rfl:  cetirizine (ZYRTEC) 10 MG tablet, Take 10 mg by mouth at bedtime., Disp: , Rfl:    chlorhexidine  (PERIDEX ) 0.12 % solution, Use as directed 10 mLs in the mouth or throat 2 (two) times daily as needed (before dental appts). , Disp: , Rfl: 5   cholestyramine (QUESTRAN) 4 g packet, Take 4 g by mouth daily., Disp: , Rfl:    clotrimazole-betamethasone (LOTRISONE) cream, Apply 1 Application topically daily as needed (Rash)., Disp: , Rfl:    dicyclomine (BENTYL) 20 MG tablet, Take 20 mg by mouth daily., Disp: , Rfl:    dofetilide  (TIKOSYN ) 250 MCG capsule, Take 1 capsule by mouth twice daily, Disp: 180 capsule, Rfl: 3   ferrous sulfate 325 (65 FE) MG tablet, Take 325 mg by mouth 2 (two) times daily with a meal., Disp: , Rfl:    furosemide  (LASIX ) 40 MG tablet, Take 1 tablet (40 mg total) by mouth daily. Take 80mg  X3 days then back to 40 mg daily, Disp: 100 tablet, Rfl: 2   hydroxychloroquine  (PLAQUENIL ) 200 MG tablet, Take 200 mg by mouth daily., Disp: , Rfl:    isosorbide  mononitrate (IMDUR ) 30 MG 24 hr tablet, TAKE 1 TABLET BY MOUTH DAILY, Disp: 90 tablet, Rfl: 2   leflunomide  (ARAVA ) 10 MG tablet, Take 10 mg by mouth daily., Disp: , Rfl:    levothyroxine (SYNTHROID) 25 MCG tablet, Take 25 mcg by mouth daily., Disp: , Rfl:    loratadine  (CLARITIN ) 10 MG tablet, Take 10 mg by mouth daily., Disp: , Rfl:    losartan  (COZAAR ) 25 MG tablet, Take 1 tablet by mouth once  daily, Disp: 90 tablet, Rfl: 3   Magnesium  200 MG TABS, Take 1 tablet (200 mg total) by mouth daily., Disp: 30 each, Rfl:    metoprolol  succinate (TOPROL -XL) 50 MG 24 hr tablet, TAKE 1 TABLET BY MOUTH DAILY  WITH OR IMMEDIATELY FOLLOWING A  MEAL, Disp: 100 tablet, Rfl: 2   predniSONE  (DELTASONE ) 5 MG tablet, Take 5 mg by mouth daily., Disp: , Rfl:    Saccharomyces boulardii (PROBIOTIC) 250 MG CAPS, Take 250 mg by mouth daily., Disp: , Rfl:    simvastatin  (ZOCOR ) 20 MG tablet, Take 1 tablet by mouth at  bedtime, Disp: 90 tablet, Rfl: 0   warfarin (COUMADIN ) 5 MG tablet, TAKE 1 TO 1 AND 1/2 TABLETS BY  MOUTH DAILY AS DIRECTED BY  COUMADIN  CLINIC, Disp: 150 tablet, Rfl: 1   albuterol  (VENTOLIN  HFA) 108 (90 Base) MCG/ACT inhaler, Inhale 2 puffs into the lungs every 6 (six) hours as needed for wheezing or shortness of breath., Disp: 24 g, Rfl: 3   fluticasone -salmeterol (WIXELA INHUB) 250-50 MCG/ACT AEPB, USE 1 INHALATION BY MOUTH  twice daily, Disp: 180 each, Rfl: 3  "

## 2024-03-04 NOTE — Patient Instructions (Addendum)
 Please call your PCP's office and request an appointment with them within 2 weeks.  Please call your cardiologist's office (Dr. Wadie) and request an appointment within 1-2 months for exacerbation of your CHF.  Please take your Lasix  TWICE DAILY both TODAY as well as the next 5 days, then resume once daily dosing.  Please monitor your weight every day. You need to call your PCP's office or my office if your weight increases more than 5 lbs from the weight it is today when you take it at home.  If your weight goes back up after you stop taking twice daily Lasix , you may need to resume twice daily dosing.

## 2024-03-05 ENCOUNTER — Ambulatory Visit: Admitting: Cardiology

## 2024-03-05 DIAGNOSIS — I480 Paroxysmal atrial fibrillation: Secondary | ICD-10-CM

## 2024-03-05 DIAGNOSIS — Z5181 Encounter for therapeutic drug level monitoring: Secondary | ICD-10-CM

## 2024-03-05 DIAGNOSIS — Z952 Presence of prosthetic heart valve: Secondary | ICD-10-CM | POA: Diagnosis not present

## 2024-03-05 LAB — POCT INR: INR: 3.1 — AB (ref 2.0–3.0)

## 2024-03-05 NOTE — Progress Notes (Signed)
"   INR 3.1 Please see anticoagulation encounter SELF TESTER Spoke to pt and instructed her to continue taking warfarin 1 tablet daily except for 1.5 tablets on Mondays. Recheck INR in 2 weeks (usually 2 weeks) Coumadin  Clinic [phone] "

## 2024-03-19 ENCOUNTER — Ambulatory Visit (INDEPENDENT_AMBULATORY_CARE_PROVIDER_SITE_OTHER): Payer: Self-pay | Admitting: Cardiovascular Disease

## 2024-03-19 DIAGNOSIS — Z952 Presence of prosthetic heart valve: Secondary | ICD-10-CM | POA: Diagnosis not present

## 2024-03-19 DIAGNOSIS — Z5181 Encounter for therapeutic drug level monitoring: Secondary | ICD-10-CM

## 2024-03-19 DIAGNOSIS — I480 Paroxysmal atrial fibrillation: Secondary | ICD-10-CM | POA: Diagnosis not present

## 2024-03-19 LAB — POCT INR: INR: 4.2 — AB (ref 2.0–3.0)

## 2024-03-19 NOTE — Progress Notes (Signed)
 Lab Results  Component Value Date   INR 4.2 (A) 03/19/2024   INR 3.1 (A) 03/05/2024   INR 2.5 02/21/2024    Description   SELF TESTER Inr 4.2, Spoke to pt and instructed her to:  -HOLD WARFARIN TODAY -Then continue taking warfarin 1 tablet daily except for 1.5 tablets on Mondays. Recheck INR in 2 weeks (usually 2 weeks) Coumadin  Clinic [phone]

## 2024-03-19 NOTE — Patient Instructions (Signed)
 Description   SELF TESTER Inr 4.2, Spoke to pt and instructed her to:  -HOLD WARFARIN TODAY -Then continue taking warfarin 1 tablet daily except for 1.5 tablets on Mondays. Recheck INR in 2 weeks (usually 2 weeks) Coumadin  Clinic [phone]

## 2024-03-25 ENCOUNTER — Ambulatory Visit: Admitting: Cardiovascular Disease

## 2024-03-27 ENCOUNTER — Encounter: Payer: Self-pay | Admitting: Cardiovascular Disease

## 2024-03-27 NOTE — Telephone Encounter (Signed)
 error

## 2024-03-27 NOTE — Telephone Encounter (Signed)
 Error

## 2024-04-21 ENCOUNTER — Ambulatory Visit: Admitting: Cardiovascular Disease

## 2024-05-25 ENCOUNTER — Encounter

## 2024-08-24 ENCOUNTER — Encounter

## 2024-11-23 ENCOUNTER — Encounter

## 2025-02-22 ENCOUNTER — Encounter

## 2025-05-24 ENCOUNTER — Encounter
